# Patient Record
Sex: Male | Born: 1945
Health system: Southern US, Community
[De-identification: ages and names within clinical notes are randomized; demographics above are authoritative.]

## PROBLEM LIST (undated history)

## (undated) DIAGNOSIS — I10 Essential (primary) hypertension: Secondary | ICD-10-CM

## (undated) DIAGNOSIS — H409 Unspecified glaucoma: Secondary | ICD-10-CM

## (undated) DIAGNOSIS — J439 Emphysema, unspecified: Secondary | ICD-10-CM

## (undated) DIAGNOSIS — F329 Major depressive disorder, single episode, unspecified: Secondary | ICD-10-CM

## (undated) DIAGNOSIS — I2510 Atherosclerotic heart disease of native coronary artery without angina pectoris: Secondary | ICD-10-CM

## (undated) DIAGNOSIS — J45909 Unspecified asthma, uncomplicated: Secondary | ICD-10-CM

## (undated) DIAGNOSIS — M1990 Unspecified osteoarthritis, unspecified site: Secondary | ICD-10-CM

## (undated) DIAGNOSIS — F32A Depression, unspecified: Secondary | ICD-10-CM

## (undated) HISTORY — DX: Emphysema, unspecified: J43.9

## (undated) HISTORY — DX: Depression, unspecified: F32.A

## (undated) HISTORY — DX: Unspecified asthma, uncomplicated: J45.909

## (undated) HISTORY — DX: Essential (primary) hypertension: I10

## (undated) HISTORY — DX: Major depressive disorder, single episode, unspecified: F32.9

## (undated) HISTORY — DX: Unspecified glaucoma: H40.9

## (undated) HISTORY — DX: Atherosclerotic heart disease of native coronary artery without angina pectoris: I25.10

## (undated) HISTORY — DX: Unspecified osteoarthritis, unspecified site: M19.90

---

## 1966-12-16 HISTORY — PX: SKIN GRAFT: SHX250

## 1966-12-16 HISTORY — PX: HUMERUS FRACTURE SURGERY: SHX670

## 1966-12-16 HISTORY — PX: HIP SURGERY: SHX245

## 1983-12-17 HISTORY — PX: BACK SURGERY: SHX140

## 1999-07-17 HISTORY — PX: OTHER SURGICAL HISTORY: SHX169

## 2010-10-18 ENCOUNTER — Inpatient Hospital Stay: Payer: Self-pay | Admitting: Internal Medicine

## 2010-12-22 ENCOUNTER — Emergency Department: Payer: Self-pay | Admitting: Emergency Medicine

## 2011-01-14 ENCOUNTER — Ambulatory Visit: Payer: Self-pay | Admitting: Internal Medicine

## 2011-10-05 ENCOUNTER — Emergency Department: Payer: Self-pay | Admitting: Emergency Medicine

## 2013-04-15 DIAGNOSIS — I2510 Atherosclerotic heart disease of native coronary artery without angina pectoris: Secondary | ICD-10-CM | POA: Insufficient documentation

## 2013-04-15 DIAGNOSIS — M549 Dorsalgia, unspecified: Secondary | ICD-10-CM | POA: Insufficient documentation

## 2013-05-14 DIAGNOSIS — F17200 Nicotine dependence, unspecified, uncomplicated: Secondary | ICD-10-CM | POA: Insufficient documentation

## 2013-07-12 DIAGNOSIS — K219 Gastro-esophageal reflux disease without esophagitis: Secondary | ICD-10-CM | POA: Insufficient documentation

## 2014-08-20 ENCOUNTER — Emergency Department: Payer: Self-pay | Admitting: Emergency Medicine

## 2015-01-20 DIAGNOSIS — Z9181 History of falling: Secondary | ICD-10-CM | POA: Insufficient documentation

## 2015-02-22 DIAGNOSIS — I208 Other forms of angina pectoris: Secondary | ICD-10-CM | POA: Insufficient documentation

## 2015-02-22 DIAGNOSIS — I2089 Other forms of angina pectoris: Secondary | ICD-10-CM | POA: Insufficient documentation

## 2015-05-25 DIAGNOSIS — Z79891 Long term (current) use of opiate analgesic: Secondary | ICD-10-CM | POA: Insufficient documentation

## 2015-06-16 DIAGNOSIS — I10 Essential (primary) hypertension: Secondary | ICD-10-CM | POA: Insufficient documentation

## 2015-06-26 DIAGNOSIS — R110 Nausea: Secondary | ICD-10-CM | POA: Insufficient documentation

## 2015-07-05 DIAGNOSIS — F329 Major depressive disorder, single episode, unspecified: Secondary | ICD-10-CM | POA: Insufficient documentation

## 2015-07-05 DIAGNOSIS — F419 Anxiety disorder, unspecified: Secondary | ICD-10-CM | POA: Insufficient documentation

## 2015-07-05 DIAGNOSIS — F3341 Major depressive disorder, recurrent, in partial remission: Secondary | ICD-10-CM | POA: Insufficient documentation

## 2015-10-05 DIAGNOSIS — M76899 Other specified enthesopathies of unspecified lower limb, excluding foot: Secondary | ICD-10-CM | POA: Insufficient documentation

## 2016-08-28 DIAGNOSIS — I714 Abdominal aortic aneurysm, without rupture, unspecified: Secondary | ICD-10-CM | POA: Insufficient documentation

## 2016-08-28 DIAGNOSIS — I7123 Aneurysm of the descending thoracic aorta, without rupture: Secondary | ICD-10-CM | POA: Insufficient documentation

## 2016-08-28 DIAGNOSIS — I712 Thoracic aortic aneurysm, without rupture: Secondary | ICD-10-CM | POA: Insufficient documentation

## 2016-12-17 DIAGNOSIS — Z0289 Encounter for other administrative examinations: Secondary | ICD-10-CM | POA: Insufficient documentation

## 2017-05-23 ENCOUNTER — Encounter (INDEPENDENT_AMBULATORY_CARE_PROVIDER_SITE_OTHER): Payer: Self-pay | Admitting: Vascular Surgery

## 2017-06-03 ENCOUNTER — Encounter (INDEPENDENT_AMBULATORY_CARE_PROVIDER_SITE_OTHER): Payer: Self-pay | Admitting: Vascular Surgery

## 2017-06-16 ENCOUNTER — Ambulatory Visit: Payer: Self-pay | Admitting: Family Medicine

## 2017-06-23 ENCOUNTER — Ambulatory Visit: Payer: Self-pay | Admitting: Family Medicine

## 2017-07-29 ENCOUNTER — Telehealth (INDEPENDENT_AMBULATORY_CARE_PROVIDER_SITE_OTHER): Payer: Self-pay | Admitting: Vascular Surgery

## 2017-07-29 ENCOUNTER — Encounter (INDEPENDENT_AMBULATORY_CARE_PROVIDER_SITE_OTHER): Payer: Self-pay | Admitting: Vascular Surgery

## 2017-07-29 NOTE — Telephone Encounter (Signed)
PT called because he wanted to reschedule because he tired and has not had any rest. I advised him that calling the same day may result in a fee for no show. He stated that he didn't know he would be tired and would not be paying. I advised him that it is policy to give a 24hr notice to reschedule. He stated he was not going to reschedule at this time just was not going to come. We disconnected and he called right back to reschedule appt. Aware of the fee.

## 2017-09-05 ENCOUNTER — Ambulatory Visit (INDEPENDENT_AMBULATORY_CARE_PROVIDER_SITE_OTHER): Payer: Medicare HMO | Admitting: Vascular Surgery

## 2017-09-05 ENCOUNTER — Encounter (INDEPENDENT_AMBULATORY_CARE_PROVIDER_SITE_OTHER): Payer: Self-pay | Admitting: Vascular Surgery

## 2017-09-05 VITALS — BP 153/95 | HR 96 | Resp 16 | Ht 69.0 in | Wt 152.0 lb

## 2017-09-05 DIAGNOSIS — I714 Abdominal aortic aneurysm, without rupture, unspecified: Secondary | ICD-10-CM

## 2017-09-05 DIAGNOSIS — I10 Essential (primary) hypertension: Secondary | ICD-10-CM | POA: Diagnosis not present

## 2017-09-05 DIAGNOSIS — F17200 Nicotine dependence, unspecified, uncomplicated: Secondary | ICD-10-CM | POA: Insufficient documentation

## 2017-09-05 DIAGNOSIS — F17210 Nicotine dependence, cigarettes, uncomplicated: Secondary | ICD-10-CM | POA: Diagnosis not present

## 2017-09-05 NOTE — Assessment & Plan Note (Signed)
blood pressure control important in reducing the progression of atherosclerotic disease and aneurysmal degeneration. On appropriate oral medications.  

## 2017-09-05 NOTE — Progress Notes (Signed)
Patient ID: Logan Vazquez, male   DOB: [birth year], 71 y.o.   MRN: [id number]  Chief Complaint  Patient presents with  . AAA    ref [facility]    HPI Logan Vazquez is a 71 y.o. male.  I am asked to see the patient by Dr. Juliann Pares for evaluation of AAA.  The patient reports Tonic low back pain and arthritis with difficulty walking but no new abdominal pain or signs of peripheral embolization. He denies fever or chills. He has had this aneurysm checked and it was reported to be 3.6 cm a little over a year ago. Smoked. He also has hypertension.   Past Medical History:  Diagnosis Date  . Arthritis   . Asthma   . Emphysema lung (HCC)   . Glaucoma   . Heart disease   . Hypertension     Past Surgical History:  Procedure Laterality Date  . BACK SURGERY    . heart surgery     2 stent placement  . HIP SURGERY Left     Family History  Problem Relation Age of Onset  . Hypertension Mother   . Cancer Father   . Hypertension Father   . Stroke Sister   . Heart disease Brother   . Heart attack Brother      Social History Social History  Substance Use Topics  . Smoking status: Current Every Day Smoker  . Smokeless tobacco: Never Used  . Alcohol use No    Allergies  Allergen Reactions  . Ibuprofen Nausea And Vomiting    Current Outpatient Prescriptions  Medication Sig Dispense Refill  . albuterol (PROVENTIL HFA;VENTOLIN HFA) 108 (90 Base) MCG/ACT inhaler Inhale into the lungs.    [name] aspirin EC 81 MG tablet Take 81 mg by mouth.    . celecoxib (CELEBREX) 100 MG capsule     . Cholecalciferol (VITAMIN D-1000 MAX ST) 1000 units tablet Take by mouth.    . Cyanocobalamin (B-12) 2000 MCG TABS Take by mouth.    . furosemide (LASIX) 20 MG tablet Take by mouth.    . gabapentin (NEURONTIN) 600 MG tablet TAKE 1 TABLET THREE TIMES DAILY    . mirtazapine (REMERON) 45 MG tablet     . nitroGLYCERIN (NITROSTAT) 0.4 MG SL tablet Place under the tongue.    [name] omeprazole (PRILOSEC) 20 MG  capsule     . PARoxetine (PAXIL) 40 MG tablet Take by mouth.     No current facility-administered medications for this visit.       REVIEW OF SYSTEMS (Negative unless checked)  Constitutional: Weight loss  Fever  Chills Cardiac: Chest pain   Chest pressure   Palpitations   Shortness of breath when laying flat   Shortness of breath at rest   Shortness of breath with exertion. Vascular:  Pain in legs with walking   Pain in legs at rest   Pain in legs when laying flat   Claudication   Pain in feet when walking  Pain in feet at rest  Pain in feet when laying flat   History of DVT   Phlebitis   Swelling in legs   Varicose veins   Non-healing ulcers Pulmonary:   Uses home oxygen   Productive cough   Hemoptysis   Wheeze  COPD   Asthma Neurologic:  Dizziness  Blackouts   Seizures   History of stroke   History of TIA  Aphasia   Temporary blindness   Dysphagia   Weakness or  numbness in arms   [] Weakness or numbness in legs Musculoskeletal:  [x] Arthritis   [] Joint swelling   [] Joint pain   [x] Low back pain Hematologic:  [] Easy bruising  [] Easy bleeding   [] Hypercoagulable state   [] Anemic  [] Hepatitis Gastrointestinal:  [] Blood in stool   [] Vomiting blood  [x] Gastroesophageal reflux/heartburn   [] Abdominal pain Genitourinary:  [] Chronic kidney disease   [] Difficult urination  [] Frequent urination  [] Burning with urination   [] Hematuria Skin:  [] Rashes   [] Ulcers   [] Wounds Psychological:  [x] History of anxiety   [x]  History of major depression.    Physical Exam BP (!) 153/95 (BP Location: Right Arm)   Pulse 96   Resp 16   Ht 5\' 9"  (1.753 m)   Wt 152 lb (68.9 kg)   BMI 22.45 kg/m  Gen:  WD/WN, NAD Head: [place]/AT, No temporalis wasting. Prominent temp pulse not noted. Ear/Nose/Throat: Hearing grossly intact, nares w/o erythema or drainage, oropharynx w/o Erythema/Exudate Eyes: Conjunctiva clear, sclera non-icteric  Neck:  trachea midline.  No bruit or JVD.  Pulmonary:  Good air movement, clear to auscultation bilaterally.  Cardiac: RRR, normal S1, S2 Vascular:  Vessel Right Left  Radial Palpable Palpable                                   Gastrointestinal: soft, non-tender/non-distended. No guarding/reflex. No masses, surgical incisions, or scars. Moderate increased aortic impulse Musculoskeletal: M/S 5/5 throughout.  Extremities without ischemic changes. His ambulation is quite labored and he uses a cane for support Neurologic: Sensation grossly intact in extremities.  Symmetrical.  Speech is fluent. Motor exam as listed above. Psychiatric: Judgment intact, Mood & affect appropriate for pt's clinical situation. Dermatologic: No rashes or ulcers noted.  No cellulitis or open wounds.    Radiology No results found.  Labs No results found for this or any previous visit (from the past 2160 hour(s)).  Assessment/Plan:  Hypertension blood pressure control important in reducing the progression of atherosclerotic disease and aneurysmal degeneration. On appropriate oral medications.   Tobacco dependence We had a discussion for approximately 3-4 minutes regarding the absolute need for smoking cessation due to the deleterious nature of tobacco on the vascular system, particularly with aneurysms. We discussed the tobacco use would diminish patency of any intervention, and likely significantly worsen progressio of disease. We discussed multiple agents for quitting including replacement therapy or medications to reduce cravings such as Chantix. The patient voices their understanding of the importance of smoking cessation.   AAA (abdominal aortic aneurysm) without rupture (HCC) The patient has a known abdominal aortic aneurysm which has not been checked in over a year. It measured 3.6 cm in May 2017 by report. I do not have a CT scan or an ultrasound to independently reviewed. This should be rechecked. We will  obtain a AAA duplex at his convenience in the near future. We have had a long discussion about the importance of smoking cessation and blood pressure control. I will see him back following his studies.      Festus Barren 09/05/2017, 4:33 PM   This note was created with Dragon medical transcription system.  Any errors from dictation are unintentional.

## 2017-09-05 NOTE — Patient Instructions (Signed)
Abdominal Aortic Aneurysm Blood pumps away from the heart through tubes (blood vessels) called arteries. Aneurysms are weak or damaged places in the wall of an artery. It bulges out like a balloon. An abdominal aortic aneurysm happens in the main artery of the body (aorta). It can burst or tear, causing bleeding inside the body. This is an emergency. It needs treatment right away. What are the causes? The exact cause is unknown. Things that could cause this problem include:  Fat and other substances building up in the lining of a tube.  Swelling of the walls of a blood vessel.  Certain tissue diseases.  Belly (abdominal) trauma.  An infection in the main artery of the body.  What increases the risk? There are things that make it more likely for you to have an aneurysm. These include:  Being over the age of 72 years old.  Having high blood pressure (hypertension).  Being a male.  Being white.  Being very overweight (obese).  Having a family history of aneurysm.  Using tobacco products.  What are the signs or symptoms? Symptoms depend on the size of the aneurysm and how fast it grows. There may not be symptoms. If symptoms occur, they can include:  Pain (belly, side, lower back, or groin).  Feeling full after eating a small amount of food.  Feeling sick to your stomach (nauseous), throwing up (vomiting), or both.  Feeling a lump in your belly that feels like it is beating (pulsating).  Feeling like you will pass out (faint).  How is this treated?  Medicine to control blood pressure and pain.  Imaging tests to see if the aneurysm gets bigger.  Surgery. How is this prevented? To lessen your chance of getting this condition:  Stop smoking. Stop chewing tobacco.  Limit or avoid alcohol.  Keep your blood pressure, blood sugar, and cholesterol within normal limits.  Eat less salt.  Eat foods low in saturated fats and cholesterol. These are found in animal and  whole dairy products.  Eat more fiber. Fiber is found in whole grains, vegetables, and fruits.  Keep a healthy weight.  Stay active and exercise often.  This information is not intended to replace advice given to you by your health care provider. Make sure you discuss any questions you have with your health care provider. Document Released: 03/29/2013 Document Revised: 05/09/2016 Document Reviewed: 01/01/2013 Elsevier Interactive Patient Education  2017 [employer]  

## 2017-09-05 NOTE — Assessment & Plan Note (Signed)
The patient has a known abdominal aortic aneurysm which has not been checked in over a year. It measured 3.6 cm in May 2017 by report. I do not have a CT scan or an ultrasound to independently reviewed. This should be rechecked. We will obtain a AAA duplex at his convenience in the near future. We have had a long discussion about the importance of smoking cessation and blood pressure control. I will see him back following his studies.

## 2017-09-05 NOTE — Assessment & Plan Note (Signed)
We had a discussion for approximately 3-4 minutes regarding the absolute need for smoking cessation due to the deleterious nature of tobacco on the vascular system, particularly with aneurysms. We discussed the tobacco use would diminish patency of any intervention, and likely significantly worsen progressio of disease. We discussed multiple agents for quitting including replacement therapy or medications to reduce cravings such as Chantix. The patient voices their understanding of the importance of smoking cessation.

## 2017-10-08 ENCOUNTER — Ambulatory Visit (INDEPENDENT_AMBULATORY_CARE_PROVIDER_SITE_OTHER): Payer: Medicare HMO | Admitting: Vascular Surgery

## 2017-10-08 ENCOUNTER — Encounter (INDEPENDENT_AMBULATORY_CARE_PROVIDER_SITE_OTHER): Payer: Self-pay | Admitting: Vascular Surgery

## 2017-10-08 ENCOUNTER — Ambulatory Visit (INDEPENDENT_AMBULATORY_CARE_PROVIDER_SITE_OTHER): Payer: Medicare HMO

## 2017-10-08 VITALS — BP 155/60 | HR 84 | Resp 16 | Wt 151.0 lb

## 2017-10-08 DIAGNOSIS — F17200 Nicotine dependence, unspecified, uncomplicated: Secondary | ICD-10-CM | POA: Diagnosis not present

## 2017-10-08 DIAGNOSIS — I714 Abdominal aortic aneurysm, without rupture, unspecified: Secondary | ICD-10-CM

## 2017-10-08 DIAGNOSIS — I10 Essential (primary) hypertension: Secondary | ICD-10-CM

## 2017-10-08 NOTE — Progress Notes (Signed)
Subjective:    Patient ID: Logan Vazquez, male    DOB: 1946-09-13, 71 y.o.   MRN: [id number] Chief Complaint  Patient presents with  . AAA    pt conv   Patient presents for a yearly abdominal aortic aneurysm follow-up. He presents without complaint. Denies any abdominal, back pain or thrombosis of the lower extremities. The patient underwent a abdominal aortic duplex exam which was notable for aneurysmal dilatation of the proximal and distal abdominal aorta with a maximum diameter of 4.4cm in the distal segment. Ectatic left common iliac artery noted. When compared to the previous exam on 05/09/2016 completed in [place] there has been an increase in the abdominal aortic aneurysm. Patient denies any fever, nausea or vomiting.   Review of Systems  Constitutional: Negative.   HENT: Negative.   Eyes: Negative.   Respiratory: Negative.   Cardiovascular: Negative.   Gastrointestinal: Negative.   Endocrine: Negative.   Genitourinary: Negative.   Musculoskeletal: Negative.   Skin: Negative.   Allergic/Immunologic: Negative.   Neurological: Negative.   Hematological: Negative.   Psychiatric/Behavioral: Negative.       Objective:   Physical Exam  Constitutional: He is oriented to person, place, and time. He appears well-developed and well-nourished. No distress.  HENT:  Head: Normocephalic and atraumatic.  Eyes: Pupils are equal, round, and reactive to light. Conjunctivae are normal.  Neck: Normal range of motion.  Cardiovascular: Normal rate, regular rhythm, normal heart sounds and intact distal pulses.   Pulmonary/Chest: Effort normal and breath sounds normal.  Abdominal: Soft. Bowel sounds are normal.  Musculoskeletal: Normal range of motion.  Neurological: He is alert and oriented to person, place, and time.  Skin: Skin is warm and dry. He is not diaphoretic.  Psychiatric: He has a normal mood and affect. His behavior is normal. Judgment and thought content normal.  Vitals  reviewed.  BP (!) 155/60 (BP Location: Left Arm)   Pulse 84   Resp 16   Wt 151 lb (68.5 kg)   BMI 22.30 kg/m   Past Medical History:  Diagnosis Date  . Arthritis   . Asthma   . Emphysema lung (HCC)   . Glaucoma   . Heart disease   . Hypertension    Social History   Social History  . Marital status: Single    Spouse name: N/A  . Number of children: N/A  . Years of education: N/A   Occupational History  . Not on file.   Social History Main Topics  . Smoking status: Current Every Day Smoker  . Smokeless tobacco: Never Used  . Alcohol use No  . Drug use: No  . Sexual activity: Not on file   Other Topics Concern  . Not on file   Social History Narrative  . No narrative on file   Past Surgical History:  Procedure Laterality Date  . BACK SURGERY    . heart surgery     2 stent placement  . HIP SURGERY Left    Family History  Problem Relation Age of Onset  . Hypertension Mother   . Cancer Father   . Hypertension Father   . Stroke Sister   . Heart disease Brother   . Heart attack Brother    Allergies  Allergen Reactions  . Ibuprofen Nausea And Vomiting      Assessment & Plan:  Patient presents for a yearly abdominal aortic aneurysm follow-up. He presents without complaint. Denies any abdominal, back pain or thrombosis of the  lower extremities. The patient underwent a abdominal aortic duplex exam which was notable for aneurysmal dilatation of the proximal and distal abdominal aorta with a maximum diameter of 4.4cm in the distal segment. Ectatic left common iliac artery noted. When compared to the previous exam on 05/09/2016 completed in [place] there has been an increase in the abdominal aortic aneurysm. Patient denies any fever, nausea or vomiting.  1. AAA (abdominal aortic aneurysm) without rupture (HCC) - Stable Studies reviewed with patient. Duplex stable, physical exam unremarkable.  No surgery or intervention at this time. The patient to follow up  in six months with an aortic duplex. The patient has an asymptomatic abdominal aortic aneurysm that is greater than 4cm in maximal diameter.  I have reviewed the natural history of abdominal aortic aneurysm and the small risk of rupture for aneurysm less than 5cm in size.  However, as these small aneurysms tend to enlarge over time, continued surveillance with ultrasound or CT scan is mandatory.  The patient's blood pressure is being adequately controlled however I have reviewed the importance of hypertension and lipid control and the importance of continuing his abstinence from tobacco.  The patient is also encouraged to exercise a minimum of 30 minutes 4 times a week.  Should the patient develop new onset abdominal or back pain or signs of peripheral embolization they are instructed to seek medical attention immediately and to alert the physician providing care that they have an aneurysm.  The patient voices their understanding.  - VAS US AAA DUPLEX; Future  2. Tobacco dependence - Stable We had a discussion for approximately 10 minutes regarding the absolute need for smoking cessation due to the deleterious nature of tobacco on the vascular system. We discussed the tobacco use would diminish patency of any intervention, and likely significantly worsen progressio of disease. We discussed multiple agents for quitting including replacement therapy or medications to reduce cravings such as Chantix. The patient voices their understanding of the importance of smoking cessation.  3. Essential hypertension - Stable Encouraged good control as its slows the progression of atherosclerotic disease  Current Outpatient Prescriptions on File Prior to Visit  Medication Sig Dispense Refill  . albuterol (PROVENTIL HFA;VENTOLIN HFA) 108 (90 Base) MCG/ACT inhaler Inhale into the lungs.    [name]. aspirin EC 81 MG tablet Take 81 mg by mouth.    . celecoxib (CELEBREX) 100 MG capsule     . Cholecalciferol (VITAMIN D-1000  MAX ST) 1000 units tablet Take by mouth.    . Cyanocobalamin (B-12) 2000 MCG TABS Take by mouth.    . furosemide (LASIX) 20 MG tablet Take by mouth.    . gabapentin (NEURONTIN) 600 MG tablet TAKE 1 TABLET THREE TIMES DAILY    . mirtazapine (REMERON) 45 MG tablet     . nitroGLYCERIN (NITROSTAT) 0.4 MG SL tablet Place under the tongue.    [name]. omeprazole (PRILOSEC) 20 MG capsule     . PARoxetine (PAXIL) 40 MG tablet Take by mouth.     No current facility-administered medications on file prior to visit.     There are no Patient Instructions on file for this visit. No Follow-up on file.   Ame Heagle A Nevaya Nagele, PA-C

## 2017-10-23 ENCOUNTER — Ambulatory Visit (INDEPENDENT_AMBULATORY_CARE_PROVIDER_SITE_OTHER): Payer: Medicare HMO | Admitting: Family Medicine

## 2017-10-23 ENCOUNTER — Encounter: Payer: Self-pay | Admitting: Family Medicine

## 2017-10-23 VITALS — BP 158/84 | HR 96 | Temp 98.0°F | Resp 16 | Ht 69.0 in | Wt 149.0 lb

## 2017-10-23 DIAGNOSIS — Z7689 Persons encountering health services in other specified circumstances: Secondary | ICD-10-CM

## 2017-10-23 DIAGNOSIS — I10 Essential (primary) hypertension: Secondary | ICD-10-CM | POA: Diagnosis not present

## 2017-10-23 DIAGNOSIS — F17200 Nicotine dependence, unspecified, uncomplicated: Secondary | ICD-10-CM | POA: Diagnosis not present

## 2017-10-23 DIAGNOSIS — F339 Major depressive disorder, recurrent, unspecified: Secondary | ICD-10-CM

## 2017-10-23 DIAGNOSIS — F419 Anxiety disorder, unspecified: Secondary | ICD-10-CM | POA: Diagnosis not present

## 2017-10-23 DIAGNOSIS — G8929 Other chronic pain: Secondary | ICD-10-CM | POA: Diagnosis not present

## 2017-10-23 DIAGNOSIS — Z23 Encounter for immunization: Secondary | ICD-10-CM | POA: Diagnosis not present

## 2017-10-23 DIAGNOSIS — I714 Abdominal aortic aneurysm, without rupture, unspecified: Secondary | ICD-10-CM

## 2017-10-23 DIAGNOSIS — J449 Chronic obstructive pulmonary disease, unspecified: Secondary | ICD-10-CM | POA: Insufficient documentation

## 2017-10-23 DIAGNOSIS — M545 Low back pain, unspecified: Secondary | ICD-10-CM

## 2017-10-23 DIAGNOSIS — I25119 Atherosclerotic heart disease of native coronary artery with unspecified angina pectoris: Secondary | ICD-10-CM | POA: Diagnosis not present

## 2017-10-23 LAB — CBC
HCT: 43.1 % (ref 38.5–50.0)
Hemoglobin: 14.7 g/dL (ref 13.2–17.1)
MCH: 30.9 pg (ref 27.0–33.0)
MCHC: 34.1 g/dL (ref 32.0–36.0)
MCV: 90.5 fL (ref 80.0–100.0)
MPV: 10 fL (ref 7.5–12.5)
Platelets: 349 10*3/uL (ref 140–400)
RBC: 4.76 10*6/uL (ref 4.20–5.80)
RDW: 13.1 % (ref 11.0–15.0)
WBC: 8.3 10*3/uL (ref 3.8–10.8)

## 2017-10-23 LAB — COMPREHENSIVE METABOLIC PANEL
AG Ratio: 1.6 (calc) (ref 1.0–2.5)
ALT: 10 U/L (ref 9–46)
AST: 14 U/L (ref 10–35)
Albumin: 4.3 g/dL (ref 3.6–5.1)
Alkaline phosphatase (APISO): 97 U/L (ref 40–115)
BUN: 15 mg/dL (ref 7–25)
CO2: 29 mmol/L (ref 20–32)
Calcium: 9.5 mg/dL (ref 8.6–10.3)
Chloride: 102 mmol/L (ref 98–110)
Creat: 0.92 mg/dL (ref 0.70–1.18)
Globulin: 2.7 g/dL (calc) (ref 1.9–3.7)
Glucose, Bld: 99 mg/dL (ref 65–139)
Potassium: 4.9 mmol/L (ref 3.5–5.3)
Sodium: 138 mmol/L (ref 135–146)
Total Bilirubin: 0.5 mg/dL (ref 0.2–1.2)
Total Protein: 7 g/dL (ref 6.1–8.1)

## 2017-10-23 LAB — LIPID PANEL
Cholesterol: 179 mg/dL (ref <200)
HDL: 43 mg/dL (ref >40)
LDL Cholesterol (Calc): 119 mg/dL (calc) — ABNORMAL HIGH
Non-HDL Cholesterol (Calc): 136 mg/dL (calc) — ABNORMAL HIGH (ref <130)
Total CHOL/HDL Ratio: 4.2 (calc) (ref <5.0)
Triglycerides: 75 mg/dL (ref <150)

## 2017-10-23 LAB — TSH: TSH: 1.55 mIU/L (ref 0.40–4.50)

## 2017-10-23 MED ORDER — DULOXETINE HCL 60 MG PO CPEP: 60.0000 mg | ORAL_CAPSULE | Freq: Every day | ORAL | 3 refills | Status: DC

## 2017-10-23 MED ORDER — LISINOPRIL 20 MG PO TABS: 20.0000 mg | ORAL_TABLET | Freq: Every day | ORAL | 3 refills | Status: DC

## 2017-10-23 NOTE — Patient Instructions (Signed)

## 2017-10-23 NOTE — Assessment & Plan Note (Signed)
Not well-controlled Given his chronic pain and depression, anxiety, we will stop Paxil and try Cymbalta Start Cymbalta 60 mg daily Advised patient that I have no intention of resuming benzos Follow-up in one month and consider dose adjustment Discussed potential side effects

## 2017-10-23 NOTE — Assessment & Plan Note (Signed)
Uncontrolled Needs tight control given history of CAD, AAA Restart ACE inhibitor We'll start lisinopril 20 mg daily Follow-up in one month Check labs

## 2017-10-23 NOTE — Assessment & Plan Note (Signed)
Status post 2 stent placement Followed by cardiology Likely needs to be on a statin for secondary prevention Check lipid panel
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
## 2017-10-23 NOTE — Assessment & Plan Note (Signed)
See plan above for depression Stop Paxil, start Cymbalta No intention to resume benzos Discussed harms of chronic benzo therapy, especially concomitantly with opioids

## 2017-10-23 NOTE — Assessment & Plan Note (Signed)
followed by vascular surgery As above, needs tight blood pressure control and likely needs statin

## 2017-10-23 NOTE — Assessment & Plan Note (Signed)
Chronic pain syndrome of multiple locations, especially lower back Continue gabapentin, but could consider increasing dose depending on GR Switch to Cymbalta in lieu of SSRI as well Can continue Celebrex, but discuss risk of kidney disease failure Discuss with the patient that he was previously on very high dose opioids and I have no intention of resuming opioid therapy for him In the future, he may benefit from pain management referral

## 2017-10-23 NOTE — Assessment & Plan Note (Signed)
We had a discussion of approximately 3-4 minutes regarding the need for tobacco cessation due to his heart disease, AAA, hypertension, lung disease He states it is too difficult at this time, he has slowly been cutting back He is not interested in medical therapy to help with cessation of this time

## 2017-10-23 NOTE — Assessment & Plan Note (Signed)
Patient is unsure if he has any lung disease It is noted in his chart that he has a history of emphysema He is not taking any controller medications He does use albuterol as needed, which he states is about daily He is wheezing on exam today Consider PFTs and controller medications

## 2017-10-28 ENCOUNTER — Telehealth: Payer: Self-pay

## 2017-10-28 NOTE — Telephone Encounter (Signed)
Lmtcb. Rachelle also tried calling pt. This was the second attempt.

## 2017-10-28 NOTE — Telephone Encounter (Signed)
-----   Message from Erasmo Downer, MD sent at 10/24/2017  8:38 AM EST ----- Normal Blood counts, kidney function, liver function, electrolytes, Thyroid function.  Cholesterol is high.  LDL (bad cholesterol) is 199, goal is <70 given known heart disease.  Advise patient to take Lipitor.  IF he agrees, ok to send Rx for Lipitor 40mg  daily #30 r3.  Will recheck LDL and LFTs in 3 months.  Erasmo Downer, MD, MPH [facility] 10/24/2017 8:38 AM

## 2017-10-30 NOTE — Telephone Encounter (Signed)
Tried calling pt. No answer. Mailed letter.

## 2017-11-03 ENCOUNTER — Other Ambulatory Visit: Payer: Self-pay

## 2017-11-03 MED ORDER — ATORVASTATIN CALCIUM 40 MG PO TABS: 40.0000 mg | ORAL_TABLET | Freq: Every day | ORAL | 3 refills | Status: DC

## 2017-11-03 MED ORDER — GABAPENTIN 600 MG PO TABS
600.0000 mg | ORAL_TABLET | Freq: Three times a day (TID) | ORAL | 3 refills | Status: DC
Start: 2017-11-03 — End: 2017-12-26

## 2017-11-03 NOTE — Telephone Encounter (Signed)
Patient calling back regarding the letter he got.Letter was mailed regarding his labs results. He agree to Lipitor 40mg  daily Qty:30 R:3 and advised that we will recheck LDL and LFTs in 3 months. Patient is also requesting a refill for Gabapentin 600mg . Is it ok to refill.

## 2017-11-16 DIAGNOSIS — I639 Cerebral infarction, unspecified: Secondary | ICD-10-CM | POA: Insufficient documentation

## 2017-11-24 ENCOUNTER — Ambulatory Visit: Payer: Self-pay | Admitting: Family Medicine

## 2017-12-02 ENCOUNTER — Telehealth: Payer: Self-pay | Admitting: Family Medicine

## 2017-12-02 NOTE — Telephone Encounter (Signed)
Logan Vazquez with [facility] is requesting verbal orders for in home physical therapy as follows:  1. Twice a week for 5 weeks  2. Medical social worker visit to assist with community resources  3. Logan Vazquez also request a written Rx for a wheeled walker faxed to [phone]. Please advise. Thanks

## 2017-12-02 NOTE — Telephone Encounter (Signed)
Ok for verbal order  °

## 2017-12-03 NOTE — Telephone Encounter (Signed)
Logan Vazquez returned call. While I had him on the phone I asked about walker. He stated they are requesting a 4 wheeled walker that also has a seat on the walker. Logan Vazquez needs a call back for the approved verbal orders. Thanks TNP

## 2017-12-03 NOTE — Telephone Encounter (Signed)
Called with verbal order ok. Need to know if the wheeled walker needs to be 2 wheel or 4 wheel walker.

## 2017-12-03 NOTE — Telephone Encounter (Signed)
Done. Order faxed.

## 2017-12-03 NOTE — Telephone Encounter (Signed)
OK to verbal order  Erasmo Downer, MD, MPH [facility] 12/03/2017 9:52 AM

## 2017-12-05 ENCOUNTER — Telehealth: Payer: Self-pay | Admitting: Family Medicine

## 2017-12-05 NOTE — Telephone Encounter (Signed)
Robin with [facility] called for a Verbal order for OT for Mr. Goette. 1 week 1 time 2 week three times   Exercises and safety.  Her number is [phone]  Thanks teri

## 2017-12-05 NOTE — Telephone Encounter (Signed)
That's fine

## 2017-12-05 NOTE — Telephone Encounter (Signed)
Appointment made for 12/17/17. Anything else needs to be done/ please review for Dr Carl Best, RMA

## 2017-12-05 NOTE — Telephone Encounter (Signed)
Robin advised-Anastasiya V Hopkins, RMA  

## 2017-12-05 NOTE — Telephone Encounter (Signed)
Samantha with [facility] called to report pt fell 3 times yesterday at home.  Pt was able to get himself up each time.  Pt is having back pain.  Samantha request I schedule an appointment w/Dr B.  [phone]

## 2017-12-17 ENCOUNTER — Ambulatory Visit: Payer: Medicare HMO | Admitting: Family Medicine

## 2017-12-22 ENCOUNTER — Telehealth: Payer: Self-pay | Admitting: Family Medicine

## 2017-12-22 NOTE — Telephone Encounter (Signed)
Logan Vazquez with [facility] called to report elevated vitals.  He states on 12/19/2017 at about 1:30pm his BP was 178/104 and today at about 1:30 his BP was 168/99.  He states that he was resting at the time and had been sitting in a chair.  He states that he has been taking his medication as directed and eating normal so he is not doing anything out of the ordinary to make it elevated.  He was wanting to know if you wanted to change anything.  CB# [phone]

## 2017-12-22 NOTE — Telephone Encounter (Signed)
Please review

## 2017-12-23 NOTE — Telephone Encounter (Signed)
Tried calling pt; phone kept ringing without VM. Will try again later.

## 2017-12-23 NOTE — Telephone Encounter (Signed)
Any chest pain, shortness of breath, headaches, vision changes?  If yes, may need to seek medical care.  If no, increase lisinopril to 40mg  daily (can take 2 of the 20mg  pills together)  Beryle Flock, Marzella Schlein, MD, MPH [facility] 12/23/2017 8:06 AM

## 2017-12-25 NOTE — Telephone Encounter (Signed)
Tried calling pt again, no answer and no VM set up.

## 2017-12-26 ENCOUNTER — Ambulatory Visit (INDEPENDENT_AMBULATORY_CARE_PROVIDER_SITE_OTHER): Payer: Medicare HMO | Admitting: Family Medicine

## 2017-12-26 ENCOUNTER — Encounter: Payer: Self-pay | Admitting: Family Medicine

## 2017-12-26 VITALS — BP 168/88 | HR 80 | Temp 97.6°F | Resp 16

## 2017-12-26 DIAGNOSIS — G8929 Other chronic pain: Secondary | ICD-10-CM | POA: Diagnosis not present

## 2017-12-26 DIAGNOSIS — M21371 Foot drop, right foot: Secondary | ICD-10-CM | POA: Diagnosis not present

## 2017-12-26 DIAGNOSIS — I10 Essential (primary) hypertension: Secondary | ICD-10-CM | POA: Diagnosis not present

## 2017-12-26 DIAGNOSIS — Z8673 Personal history of transient ischemic attack (TIA), and cerebral infarction without residual deficits: Secondary | ICD-10-CM

## 2017-12-26 DIAGNOSIS — F17200 Nicotine dependence, unspecified, uncomplicated: Secondary | ICD-10-CM

## 2017-12-26 DIAGNOSIS — M545 Low back pain: Secondary | ICD-10-CM | POA: Diagnosis not present

## 2017-12-26 MED ORDER — LISINOPRIL 40 MG PO TABS: 40.0000 mg | ORAL_TABLET | Freq: Every day | ORAL | 5 refills | Status: DC

## 2017-12-26 MED ORDER — PREGABALIN 75 MG PO CAPS: 75.0000 mg | ORAL_CAPSULE | Freq: Two times a day (BID) | ORAL | 2 refills | Status: DC

## 2017-12-26 NOTE — Telephone Encounter (Signed)
Pt coming on for OV today, will address then.

## 2017-12-26 NOTE — Assessment & Plan Note (Signed)
Discussed again importance of quitting Patient states he has quit but continues to smoke intermittently Discussed that smoking any cigarettes continues to place him at risk

## 2017-12-26 NOTE — Assessment & Plan Note (Signed)
Uncontrolled Needs tight control given h/o CAD and AAA and recent CVA Increase lisinopril to 40mg  daily F/u in 6 weeks

## 2017-12-26 NOTE — Assessment & Plan Note (Signed)
Patient was not seen for hospital f/u for this problem as no records were sent and patient did not alert office that this happened ~2 months ago He is working with PT - encouraged him to continue He has remaining deficits Needs good risk factor management Will make sure he has neurology f/u at next visit

## 2017-12-26 NOTE — Assessment & Plan Note (Signed)
2/2 CVA, not related to back pain Referral to PM&R to see if there are any assistive devices that he may benefit from

## 2017-12-26 NOTE — Assessment & Plan Note (Signed)
Chronic pain syndrome of entire low back Does have new neuro findings but these are consistent with his deficits from CVA Continue cymbalta D/c celebrex and gabapentin Trial of lyrica instead Patient asks again for opiates, which I reiterated should not be used for his chronic pain, especially with the very high doses he was on in the past States that everything makes him worse and unwilling to try more PT or ESI Referral to PM&R

## 2017-12-26 NOTE — Patient Instructions (Signed)

## 2017-12-26 NOTE — Progress Notes (Signed)
Patient: Logan Vazquez Male    DOB: December 29, 1945   72 y.o.   MRN: [id number] Visit Date: 12/26/2017  Today's Provider: Shirlee Latch, MD   I, Joslyn Hy, CMA, am acting as scribe for Shirlee Latch, MD.  Chief Complaint  Patient presents with  . Back Pain  . Hypertension   Subjective:    Back Pain  This is a chronic problem. The problem has been gradually worsening (since falling 3 years ago) since onset. Pain location: lower back bilateral sides. The quality of the pain is described as aching. The pain radiates to the left thigh and right thigh. The pain is at a severity of 9/10. The pain is severe. The pain is the same all the time. Exacerbated by: nothing. Associated symptoms include headaches, leg pain and weakness (s/p CVA). Pertinent negatives include no abdominal pain, bladder incontinence, bowel incontinence, chest pain, dysuria, fever, numbness or tingling. He has tried heat, ice and NSAIDs for the symptoms. The treatment provided no relief.    Has previously tried ESIs and states they didn't help States PT has made the pain worse in the past Had spinal fusion in 38s in [place] States that he has taken some leftover Vicodin at home and that helped a whole lot. He would like to take these chronically and states he knows lots of doctors that would give them out.  States he used to take chronic opioids He was previously taking Klonopin for anxiety, OxyContin (80mg  q6h), and oxycodone (15mg  q6h prn, Rx for #360 per month) for back pain. Dr. Hulan Fess discontinued these medications. He has not been on these medications since February 2018.  Celebrex and gabapentin didn't help at all so he stopped them.   Hypertension, follow-up: Pt's home health nurse called to inform PCP that his BP was elevated. They read 178/104, and 168/99.   BP Readings from Last 3 Encounters:  12/26/17 (!) 168/88  10/23/17 (!) 158/84  10/08/17 (!) 155/60    He is exercising. Physical  Therapy He is adherent to low salt diet.   He is experiencing dyspnea and fatigue.  Patient denies chest pain, chest pressure/discomfort, claudication, exertional chest pressure/discomfort, irregular heart beat, lower extremity edema, near-syncope, orthopnea, palpitations and syncope.   Cardiovascular risk factors include advanced age (older than 62 for men, 14 for women), dyslipidemia, hypertension, male gender and smoking/ tobacco exposure.   Current diet: in general, a "healthy" diet    ------------------------------------------------------------------------ States that since his CVA (10/2017, was admitted to [facility] for acute ischemic stroke) he has had R foot drop, weakness and nmbness of R leg.  He would like to get a brace for this.  He is currently getting PT.   Non-contrasted CT scan of the head, 11/10/2017: Moderate volume chronic small vessel ischemic changes. No acute intracranial abnormality.  Transthoracic echocardiogram, 11/11/2017:  Normal left ventricular systolic function, ejection fraction > 55%  Mitral annular calcification  Degenerative mitral valve disease  Aortic sclerosis with unusually hyperechoic leaflet tip   Aortic regurgitation - mild  Dilated ascending aorta  Normal right ventricular systolic function  Transesophageal echocardiogram, 11/14/2017: (preliminary)  Normal left ventricular systolic function, ejection fraction > 55%  Aortic regurgitation - moderate  No evidence for left atrial chamber or appendage thrombus  There is a mobile echodensity measured 0.7 cm that is associated with the aorta side of the non-coronary cusp, likely representing a lambl's excresence.  No evidence of intracardiac thrombus  No evidence of patent foramen  ovale     Allergies  Allergen Reactions  . Ibuprofen Nausea And Vomiting     Current Outpatient Medications:  .  albuterol (PROVENTIL HFA;VENTOLIN HFA) 108 (90 Base) MCG/ACT inhaler, Inhale into the lungs.,  Disp: , Rfl:  .  aspirin EC 81 MG tablet, Take 81 mg by mouth., Disp: , Rfl:  .  atorvastatin (LIPITOR) 40 MG tablet, Take 1 tablet (40 mg total) daily by mouth., Disp: 30 tablet, Rfl: 3 .  Cholecalciferol (VITAMIN D-1000 MAX ST) 1000 units tablet, Take by mouth., Disp: , Rfl:  .  Cyanocobalamin (B-12) 2000 MCG TABS, Take by mouth., Disp: , Rfl:  .  DULoxetine (CYMBALTA) 60 MG capsule, Take 1 capsule (60 mg total) daily by mouth., Disp: 30 capsule, Rfl: 3 .  furosemide (LASIX) 20 MG tablet, Take by mouth., Disp: , Rfl:  .  lisinopril (PRINIVIL,ZESTRIL) 40 MG tablet, Take 1 tablet (40 mg total) by mouth daily., Disp: 30 tablet, Rfl: 5 .  nicotine (NICODERM CQ - DOSED IN MG/24 HOURS) 14 mg/24hr patch, Place 14 mg onto the skin daily., Disp: , Rfl:  .  nitroGLYCERIN (NITROSTAT) 0.4 MG SL tablet, Place under the tongue., Disp: , Rfl:  .  omeprazole (PRILOSEC) 20 MG capsule, , Disp: , Rfl:  .  pregabalin (LYRICA) 75 MG capsule, Take 1 capsule (75 mg total) by mouth 2 (two) times daily., Disp: 60 capsule, Rfl: 2  Review of Systems  Constitutional: Negative for fever.  Cardiovascular: Negative for chest pain.  Gastrointestinal: Negative for abdominal pain and bowel incontinence.  Genitourinary: Negative for bladder incontinence and dysuria.  Musculoskeletal: Positive for back pain.  Neurological: Positive for weakness (s/p CVA) and headaches. Negative for tingling and numbness.    Social History   Tobacco Use  . Smoking status: Current Every Day Smoker    Packs/day: 0.25    Years: 56.00    Pack years: 14.00    Types: Cigarettes  . Smokeless tobacco: Never Used  . Tobacco comment: started smoking at age 51; has decreased cigarette use to 3 cigarettes per day from 1.5 PPD  Substance Use Topics  . Alcohol use: No  Has cut down on smoking  will smoke 2-3 cigs/day on days that are stressful Has not smoked daily since was hospitalized Objective:   BP (!) 168/88 (BP Location: Left Arm,  Patient Position: Sitting, Cuff Size: Normal)   Pulse 80   Temp 97.6 F (36.4 C) (Oral)   Resp 16   SpO2 96%  Vitals:   12/26/17 1441  BP: (!) 168/88  Pulse: 80  Resp: 16  Temp: 97.6 F (36.4 C)  TempSrc: Oral  SpO2: 96%     Physical Exam  Constitutional: He is oriented to person, place, and time. He appears well-developed and well-nourished. No distress.  HENT:  Head: Normocephalic and atraumatic.  Eyes: Conjunctivae are normal. No scleral icterus.  Neck: Neck supple.  Cardiovascular: Normal rate, regular rhythm, normal heart sounds and intact distal pulses.  No murmur heard. Pulmonary/Chest: Effort normal and breath sounds normal. No respiratory distress. He has no wheezes. He has no rales.  Musculoskeletal: He exhibits no edema or deformity.  Diffuse TTP over Lumbar spine and paraspinal musculature to light touch  Lymphadenopathy:    He has no cervical adenopathy.  Neurological: He is alert and oriented to person, place, and time.  Strength in L leg 5/5, R leg 4/5 in knee and hip flexion, 2/5 in ankle dorsiflexion/planter flexion Sensation decreased to light touch  in R leg  Skin: Skin is warm and dry. No rash noted.  Psychiatric: He has a normal mood and affect. His behavior is normal.  Vitals reviewed.       Assessment & Plan:      Problem List Items Addressed This Visit      Cardiovascular and Mediastinum   Hypertension - Primary    Uncontrolled Needs tight control given h/o CAD and AAA and recent CVA Increase lisinopril to 40mg  daily F/u in 6 weeks      Relevant Medications   lisinopril (PRINIVIL,ZESTRIL) 40 MG tablet     Other   Tobacco dependence    Discussed again importance of quitting Patient states he has quit but continues to smoke intermittently Discussed that smoking any cigarettes continues to place him at risk      Relevant Medications   nicotine (NICODERM CQ - DOSED IN MG/24 HOURS) 14 mg/24hr patch   Chronic back pain    Chronic pain  syndrome of entire low back Does have new neuro findings but these are consistent with his deficits from CVA Continue cymbalta D/c celebrex and gabapentin Trial of lyrica instead Patient asks again for opiates, which I reiterated should not be used for his chronic pain, especially with the very high doses he was on in the past States that everything makes him worse and unwilling to try more PT or ESI Referral to PM&R      Relevant Orders   Ambulatory referral to Physical Medicine Rehab   History of CVA (cerebrovascular accident)    Patient was not seen for hospital f/u for this problem as no records were sent and patient did not alert office that this happened ~2 months ago He is working with PT - encouraged him to continue He has remaining deficits Needs good risk factor management Will make sure he has neurology f/u at next visit      Acquired right foot drop    2/2 CVA, not related to back pain Referral to PM&R to see if there are any assistive devices that he may benefit from      Relevant Orders   Ambulatory referral to Physical Medicine Rehab      Return in about 6 weeks (around 02/06/2018) for pain, BP f/u.      The entirety of the information documented in the History of Present Illness, Review of Systems and Physical Exam were personally obtained by me. Portions of this information were initially documented by Irving Burton Ratchford, CMA and reviewed by me for thoroughness and accuracy.    Erasmo Downer, MD, MPH [facility] 12/26/2017 4:14 PM

## 2017-12-30 ENCOUNTER — Telehealth: Payer: Self-pay | Admitting: Family Medicine

## 2017-12-30 NOTE — Telephone Encounter (Signed)
Are there programs for this? Does pt need PT/OT?

## 2017-12-30 NOTE — Telephone Encounter (Signed)
Pt states he is needing assistance with cleaning his house.   States since his stroke he is not able to do it by himself any longer. Pt also states if we could mail the letter to his home he would give it to the home health nurse when she comes out.    Pt would like a call once we mail the letter so he can be looking for it in the mail.

## 2017-12-31 ENCOUNTER — Encounter: Payer: Self-pay | Admitting: Family Medicine

## 2017-12-31 ENCOUNTER — Telehealth: Payer: Self-pay | Admitting: Family Medicine

## 2017-12-31 NOTE — Telephone Encounter (Signed)
Discussed with patient at last visit that he should stop gabapentin while taking Lyrica.  He has only been on Lyrica for ~1wk, so he needs to try it for a longer time.  We also discussed that no narcotics will be given for chronic pain.  He is also being referred to PM&R for further eval/management.  Erasmo Downer, MD, MPH [facility] 12/31/2017 2:39 PM

## 2017-12-31 NOTE — Telephone Encounter (Signed)
Please advise 

## 2017-12-31 NOTE — Telephone Encounter (Signed)
Samantha with [facility] stated she had a visit with pt today and pt informed her that neither pregabalin (LYRICA) 75 MG capsule nor gabapentin (NEURONTIN) 600 MG tablet has been helping with pt's back and leg pain. Lelon Mast advised that pt stated he has continued to take both the medications. Lelon Mast is asking if there is something else pt can try for pain. [facility]. Please advise. Thanks TNP

## 2017-12-31 NOTE — Telephone Encounter (Signed)
Unsure if [facility] RN will do this, but letter completed.  Patient can come to pick up at his convenience  Bacigalupo, Marzella Schlein, MD, MPH [facility] 12/31/2017 11:20 AM

## 2017-12-31 NOTE — Telephone Encounter (Signed)
Tried calling pt; phone kept ringing with no answer. Will try again later.

## 2017-12-31 NOTE — Telephone Encounter (Signed)
Pt advised and letter mailed.

## 2018-01-05 ENCOUNTER — Telehealth: Payer: Self-pay | Admitting: Family Medicine

## 2018-01-05 NOTE — Telephone Encounter (Signed)
Aldrin advised.

## 2018-01-05 NOTE — Telephone Encounter (Signed)
Pt returned call about his medication not helping with his pain. Pt also stated he had spoken to someone in the office about getting someone to come to his home to help with cleaning his home. Please advise. Thanks TNP

## 2018-01-05 NOTE — Telephone Encounter (Signed)
Pt advised. Number I was calling below was incorrect; removed this number from pt's chart. Advised pt no narcotics will be given for leg pain, and to continue Lyrica. Letter was mailed last week. Asked pt to call back if he has not received this letter by the end of the week. May need to re-mail if he has not received the letter regarding help with cleaning house.

## 2018-01-05 NOTE — Telephone Encounter (Signed)
Aldrin with [facility] called and is requesting verbal orders for in home physical therapy as follows:  Continue home health PT for twice a week for 4 weeks.  Please advise. Thanks TNP

## 2018-01-05 NOTE — Telephone Encounter (Signed)
Tried calling pt again. No answer.

## 2018-01-09 ENCOUNTER — Telehealth: Payer: Self-pay

## 2018-01-09 NOTE — Telephone Encounter (Signed)
Samantha with [facility] is with patient there at his house for a visit. He saw Dr Beryle Flock earlier this month and was referred to pain management but when [facility] called pain clinic office they said they have not received anything. I saw that on 12/31/17 information was faxed but I guess they did not receive it. Their fax number, that was given to [facility] when she called, is [phone]. Can you please check on this for the patient. Thank you. And then let patient know. Thank Neta Mends, RMA

## 2018-01-12 NOTE — Telephone Encounter (Signed)
Information refaxed to Dr Yves Dill 01/12/18.Pt advised and given his contact information

## 2018-01-16 ENCOUNTER — Telehealth: Payer: Self-pay | Admitting: Family Medicine

## 2018-01-16 DIAGNOSIS — G8929 Other chronic pain: Secondary | ICD-10-CM

## 2018-01-16 DIAGNOSIS — M545 Low back pain: Principal | ICD-10-CM

## 2018-01-16 NOTE — Telephone Encounter (Signed)
OK to change referral to pain clinic.  Patient did want someone to help with foot weakness as well, which would be dealt with by PM&R.  Erasmo Downer, MD, MPH [facility] 01/16/2018 3:45 PM

## 2018-01-16 NOTE — Telephone Encounter (Signed)
Referral ordered

## 2018-01-16 NOTE — Telephone Encounter (Signed)
Pt refused referral to Dr Yves Dill because he does not prescribe pain meds.Dr Yves Dill suggest referral to pain clinic and pt agrees with this

## 2018-01-16 NOTE — Telephone Encounter (Signed)
FYI

## 2018-01-19 ENCOUNTER — Telehealth: Payer: Self-pay

## 2018-01-19 NOTE — Telephone Encounter (Signed)
Dr. Penelope Coop patient- Patient is requesting a refill on Valacyclovir 1 mg- Take 1 tablet TID last RX was from [facility]. Patient states he has recurrent shingles on lower back. Advised patient he may have to come in for evaluation. He requested a message be sent back first to see if he could just get a refill before coming in for a OV. Please advise.   Pharmacy-[facility] CB#[phone]

## 2018-01-19 NOTE — Telephone Encounter (Signed)
Yes please have patient come in to be evaluated as we have never seen him for this nor prescribed this medication. Thank you.

## 2018-01-20 NOTE — Telephone Encounter (Signed)
Pt advised.  He will call back and schedule an appointment.   Thanks,   -Azzan Butler  

## 2018-01-22 DIAGNOSIS — I10 Essential (primary) hypertension: Secondary | ICD-10-CM | POA: Diagnosis not present

## 2018-01-22 DIAGNOSIS — Z7982 Long term (current) use of aspirin: Secondary | ICD-10-CM | POA: Diagnosis not present

## 2018-01-22 DIAGNOSIS — Z9181 History of falling: Secondary | ICD-10-CM | POA: Diagnosis not present

## 2018-01-22 DIAGNOSIS — I2510 Atherosclerotic heart disease of native coronary artery without angina pectoris: Secondary | ICD-10-CM | POA: Diagnosis not present

## 2018-01-22 DIAGNOSIS — Z7951 Long term (current) use of inhaled steroids: Secondary | ICD-10-CM | POA: Diagnosis not present

## 2018-01-22 DIAGNOSIS — E785 Hyperlipidemia, unspecified: Secondary | ICD-10-CM | POA: Diagnosis not present

## 2018-01-22 DIAGNOSIS — I69351 Hemiplegia and hemiparesis following cerebral infarction affecting right dominant side: Secondary | ICD-10-CM | POA: Diagnosis not present

## 2018-01-28 DIAGNOSIS — Z9181 History of falling: Secondary | ICD-10-CM | POA: Diagnosis not present

## 2018-01-28 DIAGNOSIS — I10 Essential (primary) hypertension: Secondary | ICD-10-CM | POA: Diagnosis not present

## 2018-01-28 DIAGNOSIS — I2510 Atherosclerotic heart disease of native coronary artery without angina pectoris: Secondary | ICD-10-CM | POA: Diagnosis not present

## 2018-01-28 DIAGNOSIS — E785 Hyperlipidemia, unspecified: Secondary | ICD-10-CM | POA: Diagnosis not present

## 2018-01-28 DIAGNOSIS — Z7982 Long term (current) use of aspirin: Secondary | ICD-10-CM | POA: Diagnosis not present

## 2018-01-28 DIAGNOSIS — Z7951 Long term (current) use of inhaled steroids: Secondary | ICD-10-CM | POA: Diagnosis not present

## 2018-01-28 DIAGNOSIS — I69351 Hemiplegia and hemiparesis following cerebral infarction affecting right dominant side: Secondary | ICD-10-CM | POA: Diagnosis not present

## 2018-01-29 DIAGNOSIS — I10 Essential (primary) hypertension: Secondary | ICD-10-CM | POA: Diagnosis not present

## 2018-01-29 DIAGNOSIS — Z7951 Long term (current) use of inhaled steroids: Secondary | ICD-10-CM | POA: Diagnosis not present

## 2018-01-29 DIAGNOSIS — E785 Hyperlipidemia, unspecified: Secondary | ICD-10-CM | POA: Diagnosis not present

## 2018-01-29 DIAGNOSIS — I2510 Atherosclerotic heart disease of native coronary artery without angina pectoris: Secondary | ICD-10-CM | POA: Diagnosis not present

## 2018-01-29 DIAGNOSIS — Z7982 Long term (current) use of aspirin: Secondary | ICD-10-CM | POA: Diagnosis not present

## 2018-01-29 DIAGNOSIS — I69351 Hemiplegia and hemiparesis following cerebral infarction affecting right dominant side: Secondary | ICD-10-CM | POA: Diagnosis not present

## 2018-01-29 DIAGNOSIS — Z9181 History of falling: Secondary | ICD-10-CM | POA: Diagnosis not present

## 2018-01-30 ENCOUNTER — Telehealth: Payer: Self-pay | Admitting: Family Medicine

## 2018-01-30 NOTE — Telephone Encounter (Signed)
Approved.  Erasmo Downer, MD, MPH [facility] 01/30/2018 4:55 PM

## 2018-01-30 NOTE — Telephone Encounter (Signed)
Logan Vazquez with [facility] [phone] requesting verbal order for PT for twice a week for four weeks.

## 2018-01-30 NOTE — Telephone Encounter (Signed)
Please review. Thanks!  

## 2018-02-02 DIAGNOSIS — Z7982 Long term (current) use of aspirin: Secondary | ICD-10-CM | POA: Diagnosis not present

## 2018-02-02 DIAGNOSIS — Z9181 History of falling: Secondary | ICD-10-CM | POA: Diagnosis not present

## 2018-02-02 DIAGNOSIS — Z7951 Long term (current) use of inhaled steroids: Secondary | ICD-10-CM | POA: Diagnosis not present

## 2018-02-02 DIAGNOSIS — I2510 Atherosclerotic heart disease of native coronary artery without angina pectoris: Secondary | ICD-10-CM | POA: Diagnosis not present

## 2018-02-02 DIAGNOSIS — I10 Essential (primary) hypertension: Secondary | ICD-10-CM | POA: Diagnosis not present

## 2018-02-02 DIAGNOSIS — I69351 Hemiplegia and hemiparesis following cerebral infarction affecting right dominant side: Secondary | ICD-10-CM | POA: Diagnosis not present

## 2018-02-02 DIAGNOSIS — E785 Hyperlipidemia, unspecified: Secondary | ICD-10-CM | POA: Diagnosis not present

## 2018-02-03 ENCOUNTER — Ambulatory Visit
Payer: Medicare Other | Attending: Student in an Organized Health Care Education/Training Program | Admitting: Student in an Organized Health Care Education/Training Program

## 2018-02-03 ENCOUNTER — Other Ambulatory Visit: Payer: Self-pay

## 2018-02-03 ENCOUNTER — Encounter: Payer: Self-pay | Admitting: Student in an Organized Health Care Education/Training Program

## 2018-02-03 VITALS — BP 148/86 | HR 78 | Temp 97.8°F | Ht 69.0 in | Wt 152.0 lb

## 2018-02-03 DIAGNOSIS — M21371 Foot drop, right foot: Secondary | ICD-10-CM | POA: Diagnosis not present

## 2018-02-03 DIAGNOSIS — G894 Chronic pain syndrome: Secondary | ICD-10-CM | POA: Diagnosis not present

## 2018-02-03 DIAGNOSIS — Z981 Arthrodesis status: Secondary | ICD-10-CM | POA: Diagnosis not present

## 2018-02-03 DIAGNOSIS — G8929 Other chronic pain: Secondary | ICD-10-CM | POA: Diagnosis not present

## 2018-02-03 DIAGNOSIS — R531 Weakness: Secondary | ICD-10-CM | POA: Diagnosis not present

## 2018-02-03 DIAGNOSIS — J449 Chronic obstructive pulmonary disease, unspecified: Secondary | ICD-10-CM | POA: Diagnosis not present

## 2018-02-03 DIAGNOSIS — F329 Major depressive disorder, single episode, unspecified: Secondary | ICD-10-CM | POA: Insufficient documentation

## 2018-02-03 DIAGNOSIS — F419 Anxiety disorder, unspecified: Secondary | ICD-10-CM | POA: Insufficient documentation

## 2018-02-03 DIAGNOSIS — Z8673 Personal history of transient ischemic attack (TIA), and cerebral infarction without residual deficits: Secondary | ICD-10-CM | POA: Diagnosis not present

## 2018-02-03 DIAGNOSIS — Z7982 Long term (current) use of aspirin: Secondary | ICD-10-CM | POA: Insufficient documentation

## 2018-02-03 DIAGNOSIS — M5136 Other intervertebral disc degeneration, lumbar region: Secondary | ICD-10-CM

## 2018-02-03 DIAGNOSIS — I25119 Atherosclerotic heart disease of native coronary artery with unspecified angina pectoris: Secondary | ICD-10-CM | POA: Insufficient documentation

## 2018-02-03 DIAGNOSIS — I714 Abdominal aortic aneurysm, without rupture: Secondary | ICD-10-CM | POA: Diagnosis not present

## 2018-02-03 DIAGNOSIS — Z79899 Other long term (current) drug therapy: Secondary | ICD-10-CM | POA: Insufficient documentation

## 2018-02-03 DIAGNOSIS — I10 Essential (primary) hypertension: Secondary | ICD-10-CM | POA: Diagnosis not present

## 2018-02-03 DIAGNOSIS — M5441 Lumbago with sciatica, right side: Secondary | ICD-10-CM

## 2018-02-03 DIAGNOSIS — M5442 Lumbago with sciatica, left side: Secondary | ICD-10-CM | POA: Insufficient documentation

## 2018-02-03 DIAGNOSIS — F17210 Nicotine dependence, cigarettes, uncomplicated: Secondary | ICD-10-CM | POA: Diagnosis not present

## 2018-02-03 NOTE — Telephone Encounter (Signed)
L/M for aldrin as below.

## 2018-02-03 NOTE — Progress Notes (Signed)
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 coronary artery disease and lack of benefit with Celebrex, I will have him discontinue this medication.  Patient should continue his Cymbalta 60 mg daily, Lyrica 75 mg twice daily.  Plan: -UDS today.  Should be negative for all controlled/illicit substances. -History of L4-S1 posterior fusion, L4-L5 laminectomy greater than 20 years ago with worsening right lower ext pain and weakness.  Recommend  lumbar MRI without contrast. -Continue Cymbalta and Lyrica as prescribed -Discussed patient clinic policy as well as low-dose opioid therapy (MME<60) if he is accepted as a patient for chronic opioid therapy here. -Follow-up after lumbar MRI to discuss treatment plan.  Note: Please be advised that as per protocol, today's visit has been an evaluation only. We have not taken over the patient's controlled substance management.  Ordered Lab-work, Procedure(s), Referral(s), & Consult(s): Orders Placed This Encounter  Procedures  . MR LUMBAR SPINE WO CONTRAST  . Compliance Drug Analysis, Ur   Pharmacotherapy  (current): Medications ordered:  No orders of the defined types were placed in this encounter.  Medications administered during this visit: Jim L. Ferrara had no medications administered during this visit.   Pharmacological management options:  Opioid Analgesics: The patient was informed that there is no guarantee that he would be a candidate for opioid analgesics. The decision will be made following CDC guidelines. This decision will be based on the results of diagnostic studies, as well as Logan Vazquez risk profile.   Membrane stabilizer: To be determined at a later time  Muscle relaxant: To be determined at a later time  NSAID: To be determined at a later time  Other analgesic(s): To be determined at a later time   Interventional management options: Logan Vazquez was informed that there is no guarantee that he would be a candidate for interventional therapies. The decision will be based on the results of diagnostic studies, as well as Logan Vazquez risk profile.  Procedure(s) under consideration:  -Lumbar/caudal epidural steroid injection -Lumbar facet medial branch nerve block -SI joint injection -Spinal cord stimulator trial   Provider-requested follow-up: Return in about 4 weeks (around 03/03/2018) for After Imaging.  Future Appointments  Date Time Provider [place]  02/06/2018  4:00 PM Virginia Crews, MD [facility] None  02/23/2018  3:00 PM [facility]  03/03/2018  1:30 PM Gillis Santa, MD [facility] None  04/09/2019 10:30 AM [facility]ASC 2 [facility] None  04/09/2019 11:30 AM Dew, Erskine Squibb, MD [facility] None    Primary Care Physician: Virginia Crews, MD Location: [facility] Note by: Gillis Santa, M.D, Date: 02/03/2018; Time: 2:58 PM

## 2018-02-03 NOTE — Patient Instructions (Signed)
____________________________________________________________________________________________ ° °Genicular Nerve Block ° °What is a genicular nerve block? °A genicular nerve block is the injection of a local anesthetic to block the nerves that transmits pain from the knee. ° °What is the purpose of a facet nerve block? °A genicular nerve block is a diagnostic procedure to determine if the pathologic changes (i.e. arthritis, meniscal tears, etc) and inflammation within the knee joint is the source of your knee pain. It also confirms that the knee pain will respond well to the actual treatment procedure. If a genicular nerve block works, it will give you relief for several hours. After that, the pain is expected to return to normal. This test is always performed twice (usually a week or two apart) because two successful tests are required to move onto treatment. If both diagnostic tests are positive, then we schedule a treatment called radiofrequency (RF) ablation. In this procedure, the same nerves are cauterized, which typically leads to pain relief for 4 -18 months. If this process works well for one knee, it can be performed on the other knee if needed. ° °How is the procedure performed? °You will be placed on the procedure table. The injection site is sterilized with either iodine or chlorhexadine. The site to be injected is numbed with a local anesthetic, and a needle is directed to the target area. X-ray guidance is used to ensure proper placement and positioning of the needle. When the needle is properly positioned near the genicular nerve, local anesthetic is injected to numb that nerve. This will be repeated at multiple sites around the knee to block all genicular nerves. ° °Will the procedure be painful? °The injection can be painful and we therefore provide the option of receiving IV sedation. IV sedation, combined with local anesthetic, can make the injection nearly pain free. It allows you to remain very  still during the procedure, which can also make the injection easier, faster, and more successful. If you decide to have IV sedation, you must have a driver to get you home safely afterwards. In addition, you cannot have anything to eat or drink within 8 hours of your appointment (clear liquids are allowed until 3 hours before the procedure). If you take medications for diabetes, these medications may need to be adjusted the morning of the procedure. Your primary care physician can help you with this adjustment. ° °What are the discharge instructions? °If you received IV sedation do not drive or operate machinery for at least 24 hours after the procedure. You may return to work the next day following your procedure. You may resume your normal diet immediately. Do not engage in any strenuous activity for 24 hours. You should, however, engage in moderate activity that typically causes your ususal pain. If the block works, those activities should not be painful for several hours after the injection. Do not take a bath, swim, or use a hot tub for 24 hours (you may take a shower). Call the office if you have any of the following: severe pain afterwards (different than your usual symptoms), redness/swelling/discharge at the injection site(s), fevers/chills, difficulty with bowel or bladder functions. ° °What are the risks and side effects? °The complication rate for this procedure is very low. Whenever a needle enters the skin, bleeding or infection can occur. Some other serious but extremely rare risks include paralysis and death. °You may have an allergic reaction to any of the medications used. If you have a known allergy to any medications, especially local anesthetics, notify   our staff before the procedure takes place. °You may experience any of the following side effects up to 4 - 6 hours after the procedure: °• Leg muscle weakness or numbness may occur due to the local anesthetic affecting the nerves that control  your legs (this is a temporary affect and it is not paralysis). If you have any leg weakness or numbness, walk only with assistance in order to prevent falls and injury. Your leg strength will return slowly and completely. °• Dizziness may occur due to a decrease in your blood pressure. If this occurs, remain in a seated or lying position. Gradually sit up, and then stand after at least 10 minutes of sitting. °• Mild headaches may occur. Drink fluids and take pain medications if needed. If the headaches persist or become severe, call the office. °• Mild discomfort at the injection site can occur. This typically lasts for a few hours but can persist for a couple days. If this occurs, take anti-inflammatories or pain medications, apply ice to the area the day of the procedure. If it persists, apply moist heat in the day(s) following. ° °The side effects listed above can be normal. They are not dangerous and will resolve on their own. If, however, you experience any of the following, a complication may have occurred and you should either contact your doctor. If he is not readily available, then you should proceed to the closest urgent care center for evaluation: °• Severe or progressive pain at the injection site(s) °• Arm or leg weakness that progressively worsens or persists for longer than 8 hours °• Severe or progressive redness, swelling, or discharge from the injections site(s) °• Fevers, chills, nausea, or vomiting °• Bowel or bladder dysfunction (i.e. inability to urinate or pass stool or difficulty controlling either) ° °How long does it take for the procedure to work? °You should feel relief from your usual pain within the first hour. Again, this is only expected to last for several hours, at the most. Remember, you may be sore in the middle part of your back from the needles, and you must distinguish this from your usual  pain. °____________________________________________________________________________________________ ° ° °

## 2018-02-03 NOTE — Progress Notes (Signed)
Safety precautions to be maintained throughout the outpatient stay will include: orient to surroundings, keep bed in low position, maintain call bell within reach at all times, provide assistance with transfer out of bed and ambulation.  

## 2018-02-04 ENCOUNTER — Telehealth: Payer: Self-pay | Admitting: Family Medicine

## 2018-02-04 DIAGNOSIS — Z7982 Long term (current) use of aspirin: Secondary | ICD-10-CM | POA: Diagnosis not present

## 2018-02-04 DIAGNOSIS — Z7951 Long term (current) use of inhaled steroids: Secondary | ICD-10-CM | POA: Diagnosis not present

## 2018-02-04 DIAGNOSIS — Z8673 Personal history of transient ischemic attack (TIA), and cerebral infarction without residual deficits: Secondary | ICD-10-CM

## 2018-02-04 DIAGNOSIS — I2510 Atherosclerotic heart disease of native coronary artery without angina pectoris: Secondary | ICD-10-CM | POA: Diagnosis not present

## 2018-02-04 DIAGNOSIS — M21371 Foot drop, right foot: Secondary | ICD-10-CM

## 2018-02-04 DIAGNOSIS — Z9181 History of falling: Secondary | ICD-10-CM | POA: Diagnosis not present

## 2018-02-04 DIAGNOSIS — I10 Essential (primary) hypertension: Secondary | ICD-10-CM | POA: Diagnosis not present

## 2018-02-04 DIAGNOSIS — M5136 Other intervertebral disc degeneration, lumbar region: Secondary | ICD-10-CM

## 2018-02-04 DIAGNOSIS — I69351 Hemiplegia and hemiparesis following cerebral infarction affecting right dominant side: Secondary | ICD-10-CM | POA: Diagnosis not present

## 2018-02-04 DIAGNOSIS — E785 Hyperlipidemia, unspecified: Secondary | ICD-10-CM | POA: Diagnosis not present

## 2018-02-04 NOTE — Telephone Encounter (Signed)
Please review

## 2018-02-04 NOTE — Telephone Encounter (Signed)
Pt is requesting referral to have a brace placed on right leg.He states he talked with you about this at last office visit

## 2018-02-05 NOTE — Telephone Encounter (Signed)
He was referred to PM&R for this and he refused appointment when he was called about it because they do not prescribe narcotics.  We can place another referral to PM&R if he wishes.  Erasmo Downer, MD, MPH [facility] 02/05/2018 8:23 AM

## 2018-02-05 NOTE — Telephone Encounter (Signed)
Pt does agree to referral now. Will refer back to Dr. Yves Dill.

## 2018-02-06 ENCOUNTER — Ambulatory Visit: Payer: Self-pay | Admitting: Family Medicine

## 2018-02-11 DIAGNOSIS — E785 Hyperlipidemia, unspecified: Secondary | ICD-10-CM | POA: Diagnosis not present

## 2018-02-11 DIAGNOSIS — Z7982 Long term (current) use of aspirin: Secondary | ICD-10-CM | POA: Diagnosis not present

## 2018-02-11 DIAGNOSIS — Z9181 History of falling: Secondary | ICD-10-CM | POA: Diagnosis not present

## 2018-02-11 DIAGNOSIS — I2510 Atherosclerotic heart disease of native coronary artery without angina pectoris: Secondary | ICD-10-CM | POA: Diagnosis not present

## 2018-02-11 DIAGNOSIS — I69351 Hemiplegia and hemiparesis following cerebral infarction affecting right dominant side: Secondary | ICD-10-CM | POA: Diagnosis not present

## 2018-02-11 DIAGNOSIS — I10 Essential (primary) hypertension: Secondary | ICD-10-CM | POA: Diagnosis not present

## 2018-02-11 DIAGNOSIS — Z7951 Long term (current) use of inhaled steroids: Secondary | ICD-10-CM | POA: Diagnosis not present

## 2018-02-13 ENCOUNTER — Telehealth: Payer: Self-pay | Admitting: Family Medicine

## 2018-02-13 NOTE — Telephone Encounter (Signed)
Noted. Patient is following with pain management.  Erasmo Downer, MD, MPH [facility] 02/13/2018 4:22 PM

## 2018-02-13 NOTE — Telephone Encounter (Signed)
Please review

## 2018-02-13 NOTE — Telephone Encounter (Signed)
Verneda Skill Physical Therapist with [facility] wanted to advise that pt missed his physical therapy visit today with Fayrene Fearing. Fayrene Fearing stated that pt reported he was in to much pain for PT today. Please advise. Thanks TNP

## 2018-02-23 ENCOUNTER — Ambulatory Visit
Admission: RE | Admit: 2018-02-23 | Discharge: 2018-02-23 | Disposition: A | Discharge status: Home / Self Care | Payer: Medicare HMO | Source: Ambulatory Visit | Attending: Student in an Organized Health Care Education/Training Program | Admitting: Student in an Organized Health Care Education/Training Program

## 2018-02-23 DIAGNOSIS — Z981 Arthrodesis status: Secondary | ICD-10-CM

## 2018-02-24 ENCOUNTER — Telehealth: Payer: Self-pay | Admitting: *Deleted

## 2018-02-27 ENCOUNTER — Other Ambulatory Visit: Payer: Self-pay | Admitting: Family Medicine

## 2018-02-27 NOTE — Telephone Encounter (Signed)
Patient is requesting a refill on the following medication  pregabalin (LYRICA) 75 MG capsule   He uses [facility]

## 2018-03-02 MED ORDER — PREGABALIN 75 MG PO CAPS: 75.0000 mg | ORAL_CAPSULE | Freq: Two times a day (BID) | ORAL | 1 refills | Status: DC

## 2018-03-02 NOTE — Addendum Note (Signed)
Addended by: Erasmo Downer on: 03/02/2018 10:11 AM   Modules accepted: Orders

## 2018-03-03 ENCOUNTER — Encounter: Payer: Medicare Other | Admitting: Student in an Organized Health Care Education/Training Program

## 2018-03-04 ENCOUNTER — Other Ambulatory Visit: Payer: Self-pay | Admitting: Family Medicine

## 2018-03-04 NOTE — Telephone Encounter (Signed)
It looks like I sent this in 2 days ago.  Can you check with the pharmacy to see if they received that?  Erasmo Downer, MD, MPH [facility] 03/04/2018 9:03 AM

## 2018-03-04 NOTE — Telephone Encounter (Signed)
Verified that pharmacy did receive this rx on 03/02/2018.

## 2018-03-04 NOTE — Telephone Encounter (Signed)
[facility] faxed a refill request for the following medication. Thanks CC  pregabalin (LYRICA) 75 MG capsule

## 2018-03-05 ENCOUNTER — Ambulatory Visit
Admission: RE | Admit: 2018-03-05 | Discharge: 2018-03-05 | Disposition: A | Discharge status: Home / Self Care | Payer: Medicare HMO | Source: Ambulatory Visit | Attending: Student in an Organized Health Care Education/Training Program | Admitting: Student in an Organized Health Care Education/Training Program

## 2018-03-05 DIAGNOSIS — Z981 Arthrodesis status: Secondary | ICD-10-CM | POA: Insufficient documentation

## 2018-03-05 DIAGNOSIS — I714 Abdominal aortic aneurysm, without rupture: Secondary | ICD-10-CM | POA: Insufficient documentation

## 2018-03-05 DIAGNOSIS — I219 Acute myocardial infarction, unspecified: Secondary | ICD-10-CM | POA: Diagnosis not present

## 2018-03-05 DIAGNOSIS — M5126 Other intervertebral disc displacement, lumbar region: Secondary | ICD-10-CM | POA: Insufficient documentation

## 2018-03-05 DIAGNOSIS — M48061 Spinal stenosis, lumbar region without neurogenic claudication: Secondary | ICD-10-CM | POA: Insufficient documentation

## 2018-03-10 ENCOUNTER — Encounter: Payer: Self-pay | Admitting: Student in an Organized Health Care Education/Training Program

## 2018-03-10 ENCOUNTER — Ambulatory Visit
Payer: Medicare HMO | Attending: Student in an Organized Health Care Education/Training Program | Admitting: Student in an Organized Health Care Education/Training Program

## 2018-03-10 ENCOUNTER — Other Ambulatory Visit: Payer: Self-pay

## 2018-03-10 VITALS — BP 158/84 | HR 88 | Temp 97.5°F | Resp 16 | Ht 69.0 in | Wt 150.0 lb

## 2018-03-10 DIAGNOSIS — Z79899 Other long term (current) drug therapy: Secondary | ICD-10-CM | POA: Diagnosis not present

## 2018-03-10 DIAGNOSIS — G8929 Other chronic pain: Secondary | ICD-10-CM | POA: Diagnosis not present

## 2018-03-10 DIAGNOSIS — G894 Chronic pain syndrome: Secondary | ICD-10-CM

## 2018-03-10 DIAGNOSIS — Z981 Arthrodesis status: Secondary | ICD-10-CM

## 2018-03-10 DIAGNOSIS — I714 Abdominal aortic aneurysm, without rupture: Secondary | ICD-10-CM | POA: Diagnosis not present

## 2018-03-10 DIAGNOSIS — F17210 Nicotine dependence, cigarettes, uncomplicated: Secondary | ICD-10-CM | POA: Diagnosis not present

## 2018-03-10 DIAGNOSIS — M5442 Lumbago with sciatica, left side: Secondary | ICD-10-CM | POA: Diagnosis not present

## 2018-03-10 DIAGNOSIS — F329 Major depressive disorder, single episode, unspecified: Secondary | ICD-10-CM | POA: Diagnosis not present

## 2018-03-10 DIAGNOSIS — M21371 Foot drop, right foot: Secondary | ICD-10-CM | POA: Diagnosis not present

## 2018-03-10 DIAGNOSIS — M5136 Other intervertebral disc degeneration, lumbar region: Secondary | ICD-10-CM | POA: Diagnosis not present

## 2018-03-10 DIAGNOSIS — F419 Anxiety disorder, unspecified: Secondary | ICD-10-CM | POA: Insufficient documentation

## 2018-03-10 DIAGNOSIS — Z8673 Personal history of transient ischemic attack (TIA), and cerebral infarction without residual deficits: Secondary | ICD-10-CM | POA: Insufficient documentation

## 2018-03-10 DIAGNOSIS — I10 Essential (primary) hypertension: Secondary | ICD-10-CM | POA: Insufficient documentation

## 2018-03-10 DIAGNOSIS — Z7982 Long term (current) use of aspirin: Secondary | ICD-10-CM | POA: Diagnosis not present

## 2018-03-10 DIAGNOSIS — M5441 Lumbago with sciatica, right side: Secondary | ICD-10-CM | POA: Insufficient documentation

## 2018-03-10 DIAGNOSIS — J449 Chronic obstructive pulmonary disease, unspecified: Secondary | ICD-10-CM | POA: Insufficient documentation

## 2018-03-10 DIAGNOSIS — Z886 Allergy status to analgesic agent status: Secondary | ICD-10-CM | POA: Diagnosis not present

## 2018-03-10 DIAGNOSIS — M545 Low back pain: Secondary | ICD-10-CM | POA: Diagnosis present

## 2018-03-10 DIAGNOSIS — R531 Weakness: Secondary | ICD-10-CM

## 2018-03-10 NOTE — Progress Notes (Signed)
bPatient's Name: Logan Vazquez  MRN: [id number]  Referring Provider: Virginia Crews, MD  DOB: 21-Jan-1946  PCP: Virginia Crews, MD  DOS: 03/10/2018  Note by: Gillis Santa, MD  Service setting: Ambulatory outpatient  Specialty: Interventional Pain Management  Location: [facility] (AMB) Pain Management Facility    Patient type: Established   Primary Reason(s) for Visit: Encounter for evaluation before starting new chronic pain management plan of care (Level of risk: moderate) CC: Back Pain (lower)  HPI  Logan Vazquez is a 72 y.o. year old, male patient, who comes today for a follow-up evaluation to review the test results and decide on a treatment plan. He has Hypertension; Tobacco dependence; AAA (abdominal aortic aneurysm) without rupture ([place]); Chronic back pain; Depression, recurrent (HCC); COPD (chronic obstructive pulmonary disease) ([place]); Anxiety; Coronary artery disease involving native coronary artery of native heart with angina pectoris ([place]); History of CVA (cerebrovascular accident); Acquired right foot drop; History of lumbar spinal fusion; and Lumbar degenerative disc disease on their problem list. His primarily concern today is the Back Pain (lower)  Pain Assessment: Location: Lower Back Radiating: radiates through both hips down the backs of both legs to both feet including all toes Onset: More than a month ago Duration: Chronic pain Quality: Constant, Stabbing Severity: 8 /10 (self-reported pain score)  Note: Reported level is inconsistent with clinical observations. Clinically the patient looks like a 4/10 A 4/10 is viewed as "Moderately Severe" and described as impossible to ignore for more than a few minutes. With effort, patients may still be able to manage work or participate in some social activities. Very difficult to concentrate. Signs of autonomic nervous system discharge are evident: dilated pupils (mydriasis); mild sweating (diaphoresis); sleep interference. Heart rate  becomes elevated (>115 bpm). Diastolic blood pressure (lower number) rises above 100 mmHg. Patients find relief in laying down and not moving.       When using our objective Pain Scale, levels between 6 and 10/10 are said to belong in an emergency room, as it progressively worsens from a 6/10, described as severely limiting, requiring emergency care not usually available at an outpatient pain management facility. At a 6/10 level, communication becomes difficult and requires great effort. Assistance to reach the emergency department may be required. Facial flushing and profuse sweating along with potentially dangerous increases in heart rate and blood pressure will be evident. Effect on ADL: unable to bend over and get around like he used to; hard to reach up Timing: Constant Modifying factors: nothing helps  Logan Vazquez comes in today for a follow-up visit after his initial evaluation on 02/24/2018. Today we went over the results of his tests. These were explained in "Layman's terms". During today's appointment we went over my diagnostic impression, as well as the proposed treatment plan.  72 year old male with a history of lumbar spine surgery greater than 20 years ago (L4/L5 laminectomy, L4 through S1 posterior fusion, with unremarkable hardware and incorporated bone grafting), CVA in 2018 that resulted in right-sided weakness who presents with axial low back pain that radiates into his right posterior thigh and also occasional right ankle pain.  Pain has been chronic in nature.  Patient was previously on very high-dose opioid therapy which included OxyContin 80 mg 4 times daily, oxycodone 10 mg up to 6 times a day.  Patient was unable to give urine at last visit.  He presents today, will provide urine today.  Follow-up next week after results from UDS at which point we can  have patient sign opioid contract if appropriate.  Patient does not have an active opioid contract with [place] yet since he has not  submitted urine for baseline urine drug screen.   In considering the treatment plan options, Logan Vazquez was reminded that I no longer take patients for medication management only. I asked him to let me know if he had no intention of taking advantage of the interventional therapies, so that we could make arrangements to provide this space to someone interested. I also made it clear that undergoing interventional therapies for the purpose of getting pain medications is very inappropriate on the part of a patient, and it will not be tolerated in this practice. This type of behavior would suggest true addiction and therefore it requires referral to an addiction specialist.   Further details on both, my assessment(s), as well as the proposed treatment plan, please see below.   Laboratory Chemistry  Inflammation Markers (CRP: Acute Phase) (ESR: Chronic Phase) No results found for: CRP, ESRSEDRATE, LATICACIDVEN                       Rheumatology Markers No results found for: RF, ANA, [facility], LYMEIGGIGMAB, [facility]                      Renal Function Markers Lab Results  Component Value Date   BUN 15 10/23/2017   CREATININE 0.92 10/23/2017                              Hepatic Function Markers Lab Results  Component Value Date   AST 14 10/23/2017   ALT 10 10/23/2017                        Electrolytes Lab Results  Component Value Date   NA 138 10/23/2017   K 4.9 10/23/2017   CL 102 10/23/2017   CALCIUM 9.5 10/23/2017                        Neuropathy Markers No results found for: VITAMINB12, FOLATE, HGBA1C, HIV                      Bone Pathology Markers No results found for: VD25OH, VD125OH2TOT, [id number], [id number], 25OHVITD1, 25OHVITD2, 25OHVITD3, TESTOFREE, TESTOSTERONE                       Coagulation Parameters Lab Results  Component Value Date   PLT 349 10/23/2017                        Cardiovascular Markers Lab Results  Component Value Date   HGB  14.7 10/23/2017   HCT 43.1 10/23/2017                         CA Markers No results found for: CEA, CA125, LABCA2                      Note: Lab results reviewed.  Recent Diagnostic Imaging Review   Lumbosacral Imaging: Lumbar MR wo contrast:  Results for orders placed during the hospital encounter of 02/23/18  MR LUMBAR SPINE WO CONTRAST   Narrative CLINICAL DATA:  Previous fusion in 1985. Now with pain in the hips and legs. Numbness  in the legs.  EXAM: MRI LUMBAR SPINE WITHOUT CONTRAST  TECHNIQUE: Multiplanar, multisequence MR imaging of the lumbar spine was performed. No intravenous contrast was administered.  COMPARISON:  None.  FINDINGS: Segmentation: I do not have plain films for correlation. L5-S1 interspace appears hypoplastic. There may be transitional anatomy.  Alignment: Straightening of the normal lumbar lordosis due to prior fusion L3-S1.  Vertebrae: Chronic T12 compression fracture. The patient has undergone some type of posterior fixation from L3 through S1. There is extensive metallic artifact. I do not have plain film correlates. No pedicle screws are placed. There appears to be solid interbody arthrodesis.  Conus medullaris and cauda equina: Conus extends to the L1 level. Conus and cauda equina appear normal.  Paraspinal and other soft tissues: Renal cystic disease, incompletely evaluated. Abdominal aortic aneurysm, 43 by 43 mm cross-section, with some mural thrombus.  Disc levels:  L1-L2: Disc desiccation and loss of disc height. Central protrusion. Posterior element hypertrophy. Moderate to severe stenosis. BILATERAL subarticular zone narrowing results in L2 nerve root impingement. BILATERAL foraminal narrowing, not clearly compressive.  L2-L3: Disc desiccation, slight loss of disc height. Central disc extrusion. Posterior element hypertrophy. Severe stenosis, probable near complete block with crowding of the cauda equina nerve  roots centrally. Severe BILATERAL L3 nerve root impingement. BILATERAL foraminal narrowing likely affects both L2 nerve roots.  L3-L4:  Post fusion interspace, grossly unremarkable.  L4-L5:  Post fusion interspace, grossly unremarkable.  L5-S1:  Post fusion interspace, grossly unremarkable.  IMPRESSION: Solid appearing L3-S1 arthrodesis. Some type of metallic posterior fixation has been performed.  Severe stenosis at L2-3 related to posterior element hypertrophy and central disc extrusion. BILATERAL L3 and L2 nerve root impingement are likely.  Moderate to severe stenosis L1-2, central protrusion with posterior element hypertrophy, with BILATERAL L2 nerve root impingement.  4.3 cm abdominal aortic aneurysm. Some mural thrombus is present. Recommend followup by [place] in 1 year. This recommendation follows ACR consensus guidelines: White Paper of the ACR Incidental Findings Committee II on Vascular Findings. J Am Coll Radiol 2013; 10:789-794.   Electronically Signed   By: Staci Righter M.D.   On: 03/05/2018 20:16    Complexity Note: Imaging results reviewed. Results shared with Mr. Vonstein, using Layman's terms. Today I personally and independently reviewed the study images pertinent to Mr. Cawley problem.                  Meds   Current Outpatient Medications:  .  albuterol (PROVENTIL HFA;VENTOLIN HFA) 108 (90 Base) MCG/ACT inhaler, Inhale into the lungs., Disp: , Rfl:  .  aspirin EC 81 MG tablet, Take 81 mg by mouth., Disp: , Rfl:  .  atorvastatin (LIPITOR) 40 MG tablet, Take 1 tablet (40 mg total) daily by mouth., Disp: 30 tablet, Rfl: 3 .  Cholecalciferol (VITAMIN D-1000 MAX ST) 1000 units tablet, Take by mouth., Disp: , Rfl:  .  Cyanocobalamin (B-12) 2000 MCG TABS, Take by mouth., Disp: , Rfl:  .  DULoxetine (CYMBALTA) 60 MG capsule, Take 1 capsule (60 mg total) daily by mouth., Disp: 30 capsule, Rfl: 3 .  furosemide (LASIX) 20 MG tablet, Take by mouth., Disp: , Rfl:  .   lisinopril (PRINIVIL,ZESTRIL) 40 MG tablet, Take 1 tablet (40 mg total) by mouth daily., Disp: 30 tablet, Rfl: 5 .  nicotine (NICODERM CQ - DOSED IN MG/24 HOURS) 14 mg/24hr patch, Place 14 mg onto the skin daily., Disp: , Rfl:  .  nitroGLYCERIN (NITROSTAT) 0.4 MG SL tablet, Place  under the tongue., Disp: , Rfl:  .  omeprazole (PRILOSEC) 20 MG capsule, , Disp: , Rfl:  .  pregabalin (LYRICA) 75 MG capsule, Take 1 capsule (75 mg total) by mouth 2 (two) times daily., Disp: 60 capsule, Rfl: 1  ROS  Constitutional: Denies any fever or chills Gastrointestinal: No reported hemesis, hematochezia, vomiting, or acute GI distress Musculoskeletal: Denies any acute onset joint swelling, redness, loss of ROM, or weakness Neurological: No reported episodes of acute onset apraxia, aphasia, dysarthria, agnosia, amnesia, paralysis, loss of coordination, or loss of consciousness  Allergies  Mr. Poynter is allergic to ibuprofen.  [place]  Drug: Mr. Defalco  reports that he does not use drugs. Alcohol:  reports that he does not drink alcohol. Tobacco:  reports that he has been smoking cigarettes.  He has a 14.00 pack-year smoking history. He has never used smokeless tobacco. Medical:  has a past medical history of Arthritis, Asthma, CAD (coronary artery disease), Depression, Emphysema lung ([place]), Glaucoma, and Hypertension. Surgical: Mr. Lloyd  has a past surgical history that includes heart surgery (07/1999); Hip surgery (Left, 1968); Back surgery (1985); Skin graft (1968); and Humerus fracture surgery (1968). Family: family history includes Cancer in his father; Hearing loss in his mother; Heart attack in his brother; Heart disease in his brother; Hypertension in his father and mother; Stroke in his sister.  Constitutional Exam  General appearance: Well nourished, well developed, and well hydrated. In no apparent acute distress Vitals:   03/10/18 1336  BP: (!) 158/84  Pulse: 88  Resp: 16  Temp: (!) 97.5 F  (36.4 C)  TempSrc: Oral  SpO2: 96%  Weight: 150 lb (68 kg)  Height: 5' 9"  (1.753 m)   BMI Assessment: Estimated body mass index is 22.15 kg/m as calculated from the following:   Height as of this encounter: 5' 9"  (1.753 m).   Weight as of this encounter: 150 lb (68 kg).  BMI interpretation table: BMI level Category Range association with higher incidence of chronic pain  <18 kg/m2 Underweight   18.5-24.9 kg/m2 Ideal body weight   25-29.9 kg/m2 Overweight Increased incidence by 20%  30-34.9 kg/m2 Obese (Class I) Increased incidence by 68%  35-39.9 kg/m2 Severe obesity (Class II) Increased incidence by 136%  >40 kg/m2 Extreme obesity (Class III) Increased incidence by 254%   BMI Readings from Last 4 Encounters:  03/10/18 22.15 kg/m  02/03/18 22.45 kg/m  10/23/17 22.00 kg/m  10/08/17 22.30 kg/m   Wt Readings from Last 4 Encounters:  03/10/18 150 lb (68 kg)  02/03/18 152 lb (68.9 kg)  10/23/17 149 lb (67.6 kg)  10/08/17 151 lb (68.5 kg)  Psych/Mental status: Alert, oriented x 3 (person, place, & time)       Eyes: PERLA Respiratory: No evidence of acute respiratory distress  Cervical Spine Area Exam  Skin & Axial Inspection: No masses, redness, edema, swelling, or associated skin lesions Alignment: Symmetrical Functional ROM: Unrestricted ROM      Stability: No instability detected Muscle Tone/Strength: Functionally intact. No obvious neuro-muscular anomalies detected. Sensory (Neurological): Unimpaired Palpation: No palpable anomalies              Upper Extremity (UE) Exam    Side: Right upper extremity  Side: Left upper extremity  Skin & Extremity Inspection: Skin color, temperature, and hair growth are WNL. No peripheral edema or cyanosis. No masses, redness, swelling, asymmetry, or associated skin lesions. No contractures.  Skin & Extremity Inspection: Skin color, temperature, and hair growth are WNL.  No peripheral edema or cyanosis. No masses, redness, swelling,  asymmetry, or associated skin lesions. No contractures.  Functional ROM: Unrestricted ROM          Functional ROM: Unrestricted ROM          Muscle Tone/Strength: Functionally intact. No obvious neuro-muscular anomalies detected.  Muscle Tone/Strength: Functionally intact. No obvious neuro-muscular anomalies detected.  Sensory (Neurological): Unimpaired          Sensory (Neurological): Unimpaired          Palpation: No palpable anomalies              Palpation: No palpable anomalies              Specialized Test(s): Deferred         Specialized Test(s): Deferred          Thoracic Spine Area Exam  Skin & Axial Inspection: No masses, redness, or swelling Alignment: Symmetrical Functional ROM: Unrestricted ROM Stability: No instability detected Muscle Tone/Strength: Functionally intact. No obvious neuro-muscular anomalies detected. Sensory (Neurological): Unimpaired Muscle strength & Tone: No palpable anomalies  Lumbar Spine Area Exam  Skin & Axial Inspection: Well healed scar from previous spine surgery detected Alignment: Asymmetric Functional ROM: Decreased ROM      Stability: No instability detected Muscle Tone/Strength: Functionally intact. No obvious neuro-muscular anomalies detected. Sensory (Neurological): Dermatomal pain pattern Palpation: Complains of area being tender to palpation Bilateral Fist Percussion Test Provocative Tests: Lumbar Hyperextension and rotation test: Positive bilaterally for facet joint pain. Lumbar Lateral bending test: Positive due to fusion restriction. Patrick's Maneuver: evaluation deferred today                    Gait & Posture Assessment  Ambulation: Patient ambulates using a walker Gait: Antalgic Posture: Difficulty standing up straight, due to pain   Lower Extremity Exam    Side: Right lower extremity  Side: Left lower extremity  Skin & Extremity Inspection: Skin color, temperature, and hair growth are WNL. No peripheral edema or cyanosis. No  masses, redness, swelling, asymmetry, or associated skin lesions. No contractures.  Skin & Extremity Inspection: Skin color, temperature, and hair growth are WNL. No peripheral edema or cyanosis. No masses, redness, swelling, asymmetry, or associated skin lesions. No contractures.  Functional ROM: Unrestricted ROM          Functional ROM: Unrestricted ROM          Muscle Tone/Strength: Mild-to-moderate deconditioning  Muscle Tone/Strength: Mild-to-moderate deconditioning  Sensory (Neurological): Unimpaired  Sensory (Neurological): Unimpaired  Palpation: No palpable anomalies  Palpation: No palpable anomalies   Assessment & Plan  Primary Diagnosis & Pertinent Problem List: The primary encounter diagnosis was Chronic pain syndrome. Diagnoses of Lumbar degenerative disc disease, History of CVA (cerebrovascular accident), Right sided weakness, Acquired right foot drop, Chronic bilateral low back pain with bilateral sciatica, and History of lumbar spinal fusion were also pertinent to this visit.  Visit Diagnosis: 1. Chronic pain syndrome   2. Lumbar degenerative disc disease   3. History of CVA (cerebrovascular accident)   4. Right sided weakness   5. Acquired right foot drop   6. Chronic bilateral low back pain with bilateral sciatica   7. History of lumbar spinal fusion   General Recommendations: The pain condition that the patient suffers from is best treated with a multidisciplinary approach that involves an increase in physical activity to prevent de-conditioning and worsening of the pain cycle, as well as psychological counseling (formal and/or informal) to  address the co-morbid psychological affects of pain. Treatment will often involve judicious use of pain medications and interventional procedures to decrease the pain, allowing the patient to participate in the physical activity that will ultimately produce long-lasting pain reductions. The goal of the multidisciplinary approach is to return  the patient to a higher level of overall function and to restore their ability to perform activities of daily living.  72 year old male with a history of lumbar spine surgery greater than 20 years ago (L4/L5 laminectomy, L4 through S1 posterior fusion, with unremarkable hardware and incorporated bone grafting), CVA in 2018 that resulted in right-sided weakness who presents with axial low back pain that radiates into his right posterior thigh and also occasional right ankle pain.  Pain has been chronic in nature.  Patient was previously on very high-dose opioid therapy which included OxyContin 80 mg 4 times daily, oxycodone 10 mg up to 6 times a day. I told the patient that if he is a candidate for opioid therapy pending diagnostic workup, his total morphine milliequivalents would not exceed 60.   Results of patient's lumbar MRI were  reviewed today which showed L3-S1 arthrodesis along with associated severe stenosis at L2/3 and related to posterior element hypertrophy and central disc extrusion as well as bilateral L3 and L2 nerve root impingement.  In addition the patient also has severe lumbar stenosis at L1-2, central protrusion, bilateral L2 nerve root impingement. Furthermore we discussed various interventional options for his symptoms of lumbar radiculopathy including lumbar/caudal ESI.  Patient was unable to give urine at last visit.  He will provide urine today.  Follow-up next week after results from UDS at which point we can have patient sign opioid contract if appropriate.  Patient does not have an active opioid contract with [place] yet since he has not submitted urine for baseline urine drug screen.   Plan of Care  -Continue Lyrica 75 mg BID and Cymbalta 60 mg daily  -Reviewed MRI and  therapeutic options for lumbar radiculopathy including lumbar epidural steroid injection, caudal epidural steroid injection -As below -UDS  Lab-work, procedure(s), and/or referral(s): Orders Placed This  Encounter  Procedures  . Compliance Drug Analysis, Ur    Provider-requested follow-up: Return in about 8 days (around 03/18/2018) for Medication Management. Time Note: Greater than 50% of the 25 minute(s) of face-to-face time spent with Mr. Urbanek, was spent in counseling/coordination of care regarding: Mr. Bergen primary cause of pain, the significance of each one oth the test(s) anomalies and it's corresponding characteristic pain pattern(s), the treatment plan and treatment alternatives. Future Appointments  Date Time Provider [place]  04/09/2019 10:30 AM [facility] VASC 2 [facility] None  04/09/2019 11:30 AM Dew, Erskine Squibb, MD [facility] None    Primary Care Physician: Virginia Crews, MD Location: [facility] Outpatient Pain Management Facility Note by: Gillis Santa, M.D Date: 03/10/2018; Time: 2:15 PM  There are no Patient Instructions on file for this visit.

## 2018-03-10 NOTE — Progress Notes (Signed)
Safety precautions to be maintained throughout the outpatient stay will include: orient to surroundings, keep bed in low position, maintain call bell within reach at all times, provide assistance with transfer out of bed and ambulation.  

## 2018-03-11 ENCOUNTER — Telehealth: Payer: Self-pay

## 2018-03-11 NOTE — Telephone Encounter (Signed)
Please review

## 2018-03-11 NOTE — Telephone Encounter (Signed)
Logan Vazquez [facility] called requesting verbal order to continue home health therapy with frequency of 2x a week for 4 weeks. Logan Vazquez is also requesting a prescription for ankle, foot orthoses for right drop foot. Logan Vazquez request that we fax prescription to  [phone] [facility]

## 2018-03-12 NOTE — Telephone Encounter (Signed)
Called in verbal order and faxed written order for orthoses.

## 2018-03-12 NOTE — Telephone Encounter (Signed)
Rx completed.  Ok to verbal order  Viola Kinnick, Marzella Schlein, MD, MPH [facility] 03/12/2018 9:48 AM

## 2018-03-14 LAB — COMPLIANCE DRUG ANALYSIS, UR

## 2018-03-18 ENCOUNTER — Other Ambulatory Visit: Payer: Self-pay

## 2018-03-18 ENCOUNTER — Ambulatory Visit
Payer: Medicare HMO | Attending: Student in an Organized Health Care Education/Training Program | Admitting: Student in an Organized Health Care Education/Training Program

## 2018-03-18 ENCOUNTER — Encounter: Payer: Self-pay | Admitting: Student in an Organized Health Care Education/Training Program

## 2018-03-18 VITALS — BP 161/90 | HR 84 | Temp 97.9°F | Resp 18 | Ht 69.0 in | Wt 150.0 lb

## 2018-03-18 DIAGNOSIS — F339 Major depressive disorder, recurrent, unspecified: Secondary | ICD-10-CM | POA: Insufficient documentation

## 2018-03-18 DIAGNOSIS — F419 Anxiety disorder, unspecified: Secondary | ICD-10-CM | POA: Insufficient documentation

## 2018-03-18 DIAGNOSIS — Z7982 Long term (current) use of aspirin: Secondary | ICD-10-CM | POA: Insufficient documentation

## 2018-03-18 DIAGNOSIS — Z8673 Personal history of transient ischemic attack (TIA), and cerebral infarction without residual deficits: Secondary | ICD-10-CM | POA: Diagnosis not present

## 2018-03-18 DIAGNOSIS — G894 Chronic pain syndrome: Secondary | ICD-10-CM | POA: Insufficient documentation

## 2018-03-18 DIAGNOSIS — M5442 Lumbago with sciatica, left side: Secondary | ICD-10-CM | POA: Insufficient documentation

## 2018-03-18 DIAGNOSIS — M5136 Other intervertebral disc degeneration, lumbar region: Secondary | ICD-10-CM | POA: Insufficient documentation

## 2018-03-18 DIAGNOSIS — G8929 Other chronic pain: Secondary | ICD-10-CM

## 2018-03-18 DIAGNOSIS — Z886 Allergy status to analgesic agent status: Secondary | ICD-10-CM | POA: Diagnosis not present

## 2018-03-18 DIAGNOSIS — F17210 Nicotine dependence, cigarettes, uncomplicated: Secondary | ICD-10-CM | POA: Diagnosis not present

## 2018-03-18 DIAGNOSIS — Z79899 Other long term (current) drug therapy: Secondary | ICD-10-CM | POA: Insufficient documentation

## 2018-03-18 DIAGNOSIS — M21371 Foot drop, right foot: Secondary | ICD-10-CM

## 2018-03-18 DIAGNOSIS — R531 Weakness: Secondary | ICD-10-CM | POA: Insufficient documentation

## 2018-03-18 DIAGNOSIS — I714 Abdominal aortic aneurysm, without rupture: Secondary | ICD-10-CM | POA: Diagnosis not present

## 2018-03-18 DIAGNOSIS — Z981 Arthrodesis status: Secondary | ICD-10-CM | POA: Insufficient documentation

## 2018-03-18 DIAGNOSIS — J449 Chronic obstructive pulmonary disease, unspecified: Secondary | ICD-10-CM | POA: Diagnosis not present

## 2018-03-18 DIAGNOSIS — M5441 Lumbago with sciatica, right side: Secondary | ICD-10-CM | POA: Insufficient documentation

## 2018-03-18 DIAGNOSIS — I25119 Atherosclerotic heart disease of native coronary artery with unspecified angina pectoris: Secondary | ICD-10-CM | POA: Diagnosis not present

## 2018-03-18 DIAGNOSIS — I10 Essential (primary) hypertension: Secondary | ICD-10-CM | POA: Diagnosis not present

## 2018-03-18 MED ORDER — HYDROCODONE-ACETAMINOPHEN 10-325 MG PO TABS: 1.0000 | ORAL_TABLET | Freq: Three times a day (TID) | ORAL | 0 refills | Status: DC | PRN

## 2018-03-18 NOTE — Progress Notes (Signed)
Safety precautions to be maintained throughout the outpatient stay will include: orient to surroundings, keep bed in low position, maintain call bell within reach at all times, provide assistance with transfer out of bed and ambulation.  

## 2018-03-18 NOTE — Patient Instructions (Addendum)
1. Sign opioid contract 2. Rx for Hydrocodone for 1 month 4. Continue Lyrica and Cymbalta as prescribed by PCP  Prescription given for hydrocodone.

## 2018-03-18 NOTE — Progress Notes (Signed)
Patient's Name: Logan Vazquez  MRN: [id number]  Referring Provider: Virginia Crews, MD  DOB: [birth year]  PCP: Virginia Crews, MD  DOS: 03/18/2018  Note by: Gillis Santa, MD  Service setting: Ambulatory outpatient  Specialty: Interventional Pain Management  Location: [facility] (AMB) Pain Management Facility    Patient type: Established   Primary Reason(s) for Visit: Encounter for evaluation before starting new chronic pain management plan of care (Level of risk: moderate) CC: Back Pain (lower)  HPI  Logan Vazquez is a 72 y.o. year old, male patient, who comes today for a follow-up evaluation to review the test results and decide on a treatment plan. He has Hypertension; Tobacco dependence; AAA (abdominal aortic aneurysm) without rupture ([place]); Chronic back pain; Depression, recurrent (HCC); COPD (chronic obstructive pulmonary disease) ([place]); Anxiety; Coronary artery disease involving native coronary artery of native heart with angina pectoris ([place]); History of CVA (cerebrovascular accident); Acquired right foot drop; History of lumbar spinal fusion; and Lumbar degenerative disc disease on their problem list. His primarily concern today is the Back Pain (lower)  Pain Assessment: Location: Lower Back Radiating: down both legs to ankles Onset: More than a month ago Duration: Chronic pain Quality: Constant, Aching Severity: 8 /10 (self-reported pain score)  Note: Reported level is compatible with observation.                         When using our objective Pain Scale, levels between 6 and 10/10 are said to belong in an emergency room, as it progressively worsens from a 6/10, described as severely limiting, requiring emergency care not usually available at an outpatient pain management facility. At a 6/10 level, communication becomes difficult and requires great effort. Assistance to reach the emergency department may be required. Facial flushing and profuse sweating along with potentially dangerous  increases in heart rate and blood pressure will be evident. Effect on ADL: I dont do anything Timing: Constant Modifying factors: nothing  Logan Vazquez comes in today for a follow-up visit after his initial evaluation on 03/10/2018. Today we went over the results of his tests. These were explained in "Layman's terms". During today's appointment we went over my diagnostic impression, as well as the proposed treatment plan.  In considering the treatment plan options, Logan Vazquez was reminded that I no longer take patients for medication management only. I asked him to let me know if he had no intention of taking advantage of the interventional therapies, so that we could make arrangements to provide this space to someone interested. I also made it clear that undergoing interventional therapies for the purpose of getting pain medications is very inappropriate on the part of a patient, and it will not be tolerated in this practice. This type of behavior would suggest true addiction and therefore it requires referral to an addiction specialist.   Further details on both, my assessment(s), as well as the proposed treatment plan, please see below.  Controlled Substance Pharmacotherapy Assessment REMS (Risk Evaluation and Mitigation Strategy)  Analgesic: We will start with hydrocodone 10 mg 3 times daily as needed, quantity 76-monthMME/day: 30 mg/day. Pill Count: None expected due to no prior prescriptions written by our practice. NMorley Kos RN  03/18/2018 12:59 PM  Sign at close encounter Safety precautions to be maintained throughout the outpatient stay will include: orient to surroundings, keep bed in low position, maintain call bell within reach at all times, provide assistance with transfer out of bed and  ambulation.    Pharmacokinetics: Liberation and absorption (onset of action): WNL Distribution (time to peak effect): WNL Metabolism and excretion (duration of action): WNL          Pharmacodynamics: Desired effects: Analgesia: Logan Vazquez reports >50% benefit. Functional ability: Patient reports that medication allows him to accomplish basic ADLs Clinically meaningful improvement in function (CMIF): Sustained CMIF goals met Perceived effectiveness: Described as relatively effective, allowing for increase in activities of daily living (ADL) Undesirable effects: Side-effects or Adverse reactions: None reported Monitoring: [place] PMP: Online review of the past 2-monthperiod previously conducted. Not applicable at this point since we have not taken over the patient's medication management yet. List of other Serum/Urine Drug Screening Test(s):  No results found for: AMPHSCRSER, BPrichard BENZOSCRSER, COCAINSCRSER, COCAINSCRNUR, [place] THCSCRSER, [place] CANNABQUANT, [place] E[place]hattanList of all UDS test(s) done:  Lab Results  Component Value Date   SUMMARY FINAL 03/10/2018   Last UDS on record: Summary  Date Value Ref Range Status  03/10/2018 FINAL  Final    Comment:    ==================================================================== TOXASSURE COMP DRUG ANALYSIS,UR ==================================================================== Test                             Result       Flag       Units Drug Present and Declared for Prescription Verification   Pregabalin                     PRESENT      EXPECTED   Duloxetine                     PRESENT      EXPECTED Drug Present not Declared for Prescription Verification   Mirtazapine                    PRESENT      UNEXPECTED Drug Absent but Declared for Prescription Verification   Salicylate                     Not Detected UNEXPECTED    Aspirin, as indicated in the declared medication list, is not    always detected even when used as directed. ==================================================================== Test                      Result    Flag   Units      Ref Range    Creatinine              159              mg/dL      >=20 ==================================================================== Declared Medications:  The flagging and interpretation on this report are based on the  following declared medications.  Unexpected results may arise from  inaccuracies in the declared medications.  **Note: The testing scope of this panel includes these medications:  Duloxetine  Pregabalin  **Note: The testing scope of this panel does not include small to  moderate amounts of these reported medications:  Aspirin (Aspirin 81)  **Note: The testing scope of this panel does not include following  reported medications:  Albuterol  Atorvastatin  Cholecalciferol  Furosemide  Lisinopril  Nicotine  Nitroglycerin (NTG)  Omeprazole  Vitamin B12 ==================================================================== For clinical consultation, please call ([phone] ====================================================================    UDS interpretation: No unexpected findings.  Medication Assessment Form: Patient introduced to form today Treatment compliance: Treatment may start today if patient agrees with proposed plan. Evaluation of compliance is not applicable at this point Risk Assessment Profile: Aberrant behavior: See initial evaluations. None observed or detected today Comorbid factors increasing risk of overdose: See initial evaluation. No additional risks detected today Medical Psychology Evaluation: Moderate Risk Opioid Risk Tool - 03/18/18 1257      Family History of Substance Abuse   Alcohol  Negative    Illegal Drugs  Negative    Rx Drugs  Negative      Personal History of Substance Abuse   Alcohol  Negative    Illegal Drugs  Negative    Rx Drugs  Negative      Age   Age between 67-45 years   No      History of Preadolescent Sexual Abuse   History of Preadolescent Sexual Abuse  Negative or Male      Psychological Disease    Psychological Disease  Negative    Depression  Positive      Total Score   Opioid Risk Tool Scoring  1    Opioid Risk Interpretation  Low Risk      ORT Scoring interpretation table:  Score <3 = Low Risk for SUD  Score between 4-7 = Moderate Risk for SUD  Score >8 = High Risk for Opioid Abuse   Risk Mitigation Strategies:  Patient opioid safety counseling: Completed today. Counseling provided to patient as per "Patient Counseling Document". Document signed by patient, attesting to counseling and understanding Patient-Prescriber Agreement (PPA): Obtained today.  Controlled substance notification to other providers: Written and sent today.  Pharmacologic Plan: Today we may be taking over the patient's pharmacological regimen. See below.             Laboratory Chemistry  Inflammation Markers (CRP: Acute Phase) (ESR: Chronic Phase) No results found for: CRP, ESRSEDRATE, LATICACIDVEN                       Rheumatology Markers No results found for: RF, ANA, Rush Barer, LYMEIGGIGMAB, [facility]                      Renal Function Markers Lab Results  Component Value Date   BUN 15 10/23/2017   CREATININE 0.92 10/23/2017                              Hepatic Function Markers Lab Results  Component Value Date   AST 14 10/23/2017   ALT 10 10/23/2017                        Electrolytes Lab Results  Component Value Date   NA 138 10/23/2017   K 4.9 10/23/2017   CL 102 10/23/2017   CALCIUM 9.5 10/23/2017                        Neuropathy Markers No results found for: VITAMINB12, FOLATE, HGBA1C, HIV                      Bone Pathology Markers No results found for: [place], [id number], AT5573UK0, [id number], 25OHVITD1, 25OHVITD2, 25OHVITD3, TESTOFREE, TESTOSTERONE                       Coagulation Parameters Lab Results  Component Value Date   PLT 349 10/23/2017                        Cardiovascular Markers Lab Results  Component Value Date   HGB 14.7 10/23/2017    HCT 43.1 10/23/2017                         CA Markers No results found for: CEA, CA125, LABCA2                      Note: Lab results reviewed.  Recent Diagnostic Imaging Review   Lumbosacral Imaging: Lumbar MR wo contrast:  Results for orders placed during the hospital encounter of 02/23/18  MR LUMBAR SPINE WO CONTRAST   Narrative CLINICAL DATA:  Previous fusion in 1985. Now with pain in the hips and legs. Numbness in the legs.  EXAM: MRI LUMBAR SPINE WITHOUT CONTRAST  TECHNIQUE: Multiplanar, multisequence MR imaging of the lumbar spine was performed. No intravenous contrast was administered.  COMPARISON:  None.  FINDINGS: Segmentation: I do not have plain films for correlation. L5-S1 interspace appears hypoplastic. There may be transitional anatomy.  Alignment: Straightening of the normal lumbar lordosis due to prior fusion L3-S1.  Vertebrae: Chronic T12 compression fracture. The patient has undergone some type of posterior fixation from L3 through S1. There is extensive metallic artifact. I do not have plain film correlates. No pedicle screws are placed. There appears to be solid interbody arthrodesis.  Conus medullaris and cauda equina: Conus extends to the L1 level. Conus and cauda equina appear normal.  Paraspinal and other soft tissues: Renal cystic disease, incompletely evaluated. Abdominal aortic aneurysm, 43 by 43 mm cross-section, with some mural thrombus.  Disc levels:  L1-L2: Disc desiccation and loss of disc height. Central protrusion. Posterior element hypertrophy. Moderate to severe stenosis. BILATERAL subarticular zone narrowing results in L2 nerve root impingement. BILATERAL foraminal narrowing, not clearly compressive.  L2-L3: Disc desiccation, slight loss of disc height. Central disc extrusion. Posterior element hypertrophy. Severe stenosis, probable near complete block with crowding of the cauda equina nerve roots centrally. Severe  BILATERAL L3 nerve root impingement. BILATERAL foraminal narrowing likely affects both L2 nerve roots.  L3-L4:  Post fusion interspace, grossly unremarkable.  L4-L5:  Post fusion interspace, grossly unremarkable.  L5-S1:  Post fusion interspace, grossly unremarkable.  IMPRESSION: Solid appearing L3-S1 arthrodesis. Some type of metallic posterior fixation has been performed.  Severe stenosis at L2-3 related to posterior element hypertrophy and central disc extrusion. BILATERAL L3 and L2 nerve root impingement are likely.  Moderate to severe stenosis L1-2, central protrusion with posterior element hypertrophy, with BILATERAL L2 nerve root impingement.  4.3 cm abdominal aortic aneurysm. Some mural thrombus is present. Recommend followup by [place] in 1 year. This recommendation follows ACR consensus guidelines: White Paper of the ACR Incidental Findings Committee II on Vascular Findings. J Am Coll Radiol 2013; 10:789-794.   Electronically Signed   By: Staci Righter M.D.   On: 03/05/2018 20:16    Complexity Note: Imaging results reviewed. Results shared with Logan Vazquez, using Layman's terms.                         Meds   Current Outpatient Medications:  .  albuterol (PROVENTIL HFA;VENTOLIN HFA) 108 (90 Base) MCG/ACT inhaler, Inhale into the lungs., Disp: , Rfl:  .  aspirin EC 81 MG tablet, Take  81 mg by mouth., Disp: , Rfl:  .  atorvastatin (LIPITOR) 40 MG tablet, Take 1 tablet (40 mg total) daily by mouth., Disp: 30 tablet, Rfl: 3 .  Cholecalciferol (VITAMIN D-1000 MAX ST) 1000 units tablet, Take by mouth., Disp: , Rfl:  .  Cyanocobalamin (B-12) 2000 MCG TABS, Take by mouth., Disp: , Rfl:  .  DULoxetine (CYMBALTA) 60 MG capsule, Take 1 capsule (60 mg total) daily by mouth., Disp: 30 capsule, Rfl: 3 .  furosemide (LASIX) 20 MG tablet, Take by mouth., Disp: , Rfl:  .  lisinopril (PRINIVIL,ZESTRIL) 40 MG tablet, Take 1 tablet (40 mg total) by mouth daily., Disp: 30 tablet, Rfl:  5 .  nitroGLYCERIN (NITROSTAT) 0.4 MG SL tablet, Place under the tongue., Disp: , Rfl:  .  omeprazole (PRILOSEC) 20 MG capsule, , Disp: , Rfl:  .  pregabalin (LYRICA) 75 MG capsule, Take 1 capsule (75 mg total) by mouth 2 (two) times daily., Disp: 60 capsule, Rfl: 1 .  HYDROcodone-acetaminophen (NORCO) 10-325 MG tablet, Take 1 tablet by mouth 3 (three) times daily as needed for severe pain., Disp: 90 tablet, Rfl: 0 .  nicotine (NICODERM CQ - DOSED IN MG/24 HOURS) 14 mg/24hr patch, Place 14 mg onto the skin daily., Disp: , Rfl:   ROS  Constitutional: Denies any fever or chills Gastrointestinal: No reported hemesis, hematochezia, vomiting, or acute GI distress Musculoskeletal: Denies any acute onset joint swelling, redness, loss of ROM, or weakness Neurological: No reported episodes of acute onset apraxia, aphasia, dysarthria, agnosia, amnesia, paralysis, loss of coordination, or loss of consciousness  Allergies  Logan Vazquez is allergic to ibuprofen.  [place]  Drug: Logan Vazquez  reports that he does not use drugs. Alcohol:  reports that he does not drink alcohol. Tobacco:  reports that he has been smoking cigarettes.  He has a 14.00 pack-year smoking history. He has never used smokeless tobacco. Medical:  has a past medical history of Arthritis, Asthma, CAD (coronary artery disease), Depression, Emphysema lung ([place]), Glaucoma, and Hypertension. Surgical: Logan Vazquez  has a past surgical history that includes heart surgery (07/1999); Hip surgery (Left, 1968); Back surgery (1985); Skin graft (1968); and Humerus fracture surgery (1968). Family: family history includes Cancer in his father; Hearing loss in his mother; Heart attack in his brother; Heart disease in his brother; Hypertension in his father and mother; Stroke in his sister.  Constitutional Exam  General appearance: Well nourished, well developed, and well hydrated. In no apparent acute distress Vitals:   03/18/18 1250  BP: (!) 161/90   Pulse: 84  Resp: 18  Temp: 97.9 F (36.6 C)  TempSrc: Oral  SpO2: 96%  Weight: 150 lb (68 kg)  Height: _0  (1.753 m)   BMI Assessment: Estimated body mass index is 22.15 kg/m as calculated from the following:   Height as of this encounter: _1  (1.753 m).   Weight as of this encounter: 150 lb (68 kg).  BMI interpretation table: BMI level Category Range association with higher incidence of chronic pain  <18 kg/m2 Underweight   18.5-24.9 kg/m2 Ideal body weight   25-29.9 kg/m2 Overweight Increased incidence by 20%  30-34.9 kg/m2 Obese (Class I) Increased incidence by 68%  35-39.9 kg/m2 Severe obesity (Class II) Increased incidence by 136%  >40 kg/m2 Extreme obesity (Class III) Increased incidence by 254%   BMI Readings from Last 4 Encounters:  03/18/18 22.15 kg/m  03/10/18 22.15 kg/m  02/03/18 22.45 kg/m  10/23/17 22.00 kg/m   Wt Readings  from Last 4 Encounters:  03/18/18 150 lb (68 kg)  03/10/18 150 lb (68 kg)  02/03/18 152 lb (68.9 kg)  10/23/17 149 lb (67.6 kg)  Psych/Mental status: Alert, oriented x 3 (person, place, & time)       Eyes: PERLA Respiratory: No evidence of acute respiratory distress  Cervical Spine Area Exam  Skin & Axial Inspection: No masses, redness, edema, swelling, or associated skin lesions Alignment: Symmetrical Functional ROM: Unrestricted ROM      Stability: No instability detected Muscle Tone/Strength: Functionally intact. No obvious neuro-muscular anomalies detected. Sensory (Neurological): Unimpaired Palpation: No palpable anomalies              Upper Extremity (UE) Exam    Side: Right upper extremity  Side: Left upper extremity  Skin & Extremity Inspection: Skin color, temperature, and hair growth are WNL. No peripheral edema or cyanosis. No masses, redness, swelling, asymmetry, or associated skin lesions. No contractures.  Skin & Extremity Inspection: Skin color, temperature, and hair growth are WNL. No peripheral edema or  cyanosis. No masses, redness, swelling, asymmetry, or associated skin lesions. No contractures.  Functional ROM: Unrestricted ROM          Functional ROM: Unrestricted ROM          Muscle Tone/Strength: Functionally intact. No obvious neuro-muscular anomalies detected.  Muscle Tone/Strength: Functionally intact. No obvious neuro-muscular anomalies detected.  Sensory (Neurological): Unimpaired          Sensory (Neurological): Unimpaired          Palpation: No palpable anomalies              Palpation: No palpable anomalies              Specialized Test(s): Deferred         Specialized Test(s): Deferred          Thoracic Spine Area Exam  Skin & Axial Inspection: No masses, redness, or swelling Alignment: Symmetrical Functional ROM: Unrestricted ROM Stability: No instability detected Muscle Tone/Strength: Functionally intact. No obvious neuro-muscular anomalies detected. Sensory (Neurological): Unimpaired Muscle strength & Tone: No palpable anomalies  Lumbar Spine Area Exam  Skin & Axial Inspection: Well healed scar from previous spine surgery detected Alignment: Asymmetric Functional ROM: Decreased ROM      Stability: No instability detected Muscle Tone/Strength: Functionally intact. No obvious neuro-muscular anomalies detected. Sensory (Neurological): Dermatomal pain pattern Palpation: Complains of area being tender to palpation       Provocative Tests: Lumbar Hyperextension and rotation test: Positive bilaterally for facet joint pain. Lumbar Lateral bending test: Positive ipsilateral radicular pain, bilaterally. Positive for bilateral foraminal stenosis. Patrick's Maneuver: evaluation deferred today                    Gait & Posture Assessment  Ambulation: Patient ambulates using a walker Gait: Antalgic Posture: Difficulty standing up straight, due to pain   Lower Extremity Exam    Side: Right lower extremity  Side: Left lower extremity  Skin & Extremity Inspection: Skin color,  temperature, and hair growth are WNL. No peripheral edema or cyanosis. No masses, redness, swelling, asymmetry, or associated skin lesions. No contractures.  Skin & Extremity Inspection: Skin color, temperature, and hair growth are WNL. No peripheral edema or cyanosis. No masses, redness, swelling, asymmetry, or associated skin lesions. No contractures.  Functional ROM: Unrestricted ROM          Functional ROM: Unrestricted ROM          Muscle  Tone/Strength: Functionally intact. No obvious neuro-muscular anomalies detected.  Muscle Tone/Strength: Functionally intact. No obvious neuro-muscular anomalies detected.  Sensory (Neurological): Unimpaired  Sensory (Neurological): Unimpaired  Palpation: No palpable anomalies  Palpation: No palpable anomalies   Assessment & Plan  Primary Diagnosis & Pertinent Problem List: The primary encounter diagnosis was Chronic pain syndrome. Diagnoses of Lumbar degenerative disc disease, History of CVA (cerebrovascular accident), Right sided weakness, Acquired right foot drop, Chronic bilateral low back pain with bilateral sciatica, History of lumbar spinal fusion, and Controlled substance agreement signed were also pertinent to this visit.  Visit Diagnosis: 1. Chronic pain syndrome   2. Lumbar degenerative disc disease   3. History of CVA (cerebrovascular accident)   4. Right sided weakness   5. Acquired right foot drop   6. Chronic bilateral low back pain with bilateral sciatica   7. History of lumbar spinal fusion   8. Controlled substance agreement signed    General Recommendations: The pain condition that the patient suffers from is best treated with a multidisciplinary approach that involves an increase in physical activity to prevent de-conditioning and worsening of the pain cycle, as well as psychological counseling (formal and/or informal) to address the co-morbid psychological affects of pain. Treatment will often involve judicious use of pain medications  and interventional procedures to decrease the pain, allowing the patient to participate in the physical activity that will ultimately produce long-lasting pain reductions. The goal of the multidisciplinary approach is to return the patient to a higher level of overall function and to restore their ability to perform activities of daily living.  72 year old male with a history of lumbar spine surgery greater than 20 years ago(L4/L5 laminectomy, L4 through S1 posterior fusion, with unremarkable hardware and incorporated bone grafting),CVA in 2018 that resulted in right-sided weakness who presents with axial low back pain that radiates into his right posterior thigh and also occasional right ankle pain. Pain has been chronic in nature. Patient was previously on very high-dose opioid therapy which included OxyContin 80 mg 4 times daily, oxycodone 10 mg up to 6 times a day. I notified the patient that his morphine milliequivalents will not exceed 60 at our clinic.  Patient's most recent lumbar MRI shows L3-S1 arthrodesis along with associated severe stenosis at L2/3 and related to posterior element hypertrophy and central disc extrusion as well as bilateral L3 and L2 nerve root impingement.  We discussed performing L2/3 epidural steroid injection but the patient would like to wait.  Patient returns today for follow-up.  His urine drug screen was appropriate.  Patient is finding benefit with Lyrica 75 mg twice daily and Cymbalta 60 mg daily which are prescribed by his primary care physician.  Today we will start hydrocodone 10 mg 3 times daily as needed for moderate to severe pain.  Patient will also complete opioid contract.  Plan: -Sign opiate contract today -Prescription for hydrocodone 10 mg 3 times daily as needed, quantity 15-month-Continue Lyrica 75 mill grams twice daily, Cymbalta 60 mill grams daily -Recommended Tylenol 500 mg 3 times daily to 4 times daily as needed moderate pain   Plan of  Care  Pharmacotherapy (Medications Ordered): Meds ordered this encounter  Medications  . HYDROcodone-acetaminophen (NORCO) 10-325 MG tablet    Sig: Take 1 tablet by mouth 3 (three) times daily as needed for severe pain.    Dispense:  90 tablet    Refill:  0    Do not place this medication, or any other prescription from our  practice, on "Automatic Refill". Patient may have prescription filled one day early if pharmacy is closed on scheduled refill date.  For chronic pain. To last for 30 days from fill date    Provider-requested follow-up: Return in about 1 month (around 04/16/2018) for Medication Management. Time Note: Greater than 50% of the 25 minute(s) of face-to-face time spent with Logan Vazquez, was spent in counseling/coordination of care regarding: Logan Vazquez primary cause of pain, the treatment plan, treatment alternatives, the risks and possible complications of proposed treatment, medication side effects, the opioid analgesic risks and possible complications, the appropriate use of his medications, the goals of pain management (increased in functionality), the medication agreement and the patient's responsibilities when it comes to controlled substances. Future Appointments  Date Time Provider [place]  04/16/2018  1:30 PM Gillis Santa, MD [facility] None  04/09/2019 10:30 AM [facility] None  04/09/2019 11:30 AM Dew, Erskine Squibb, MD [facility] None    Primary Care Physician: Virginia Crews, MD Location: [facility] Note by: Gillis Santa, M.D Date: 03/18/2018; Time: 1:52 PM  Patient Instructions  1. Sign opioid contract 2. Rx for Hydrocodone for 1 month 4. Continue Lyrica and Cymbalta as prescribed by PCP  Prescription given for hydrocodone.

## 2018-04-10 ENCOUNTER — Telehealth: Payer: Self-pay | Admitting: Family Medicine

## 2018-04-10 NOTE — Telephone Encounter (Signed)
Aldrin advised. 

## 2018-04-10 NOTE — Telephone Encounter (Signed)
Aldrin with [facility] called for verbal orders  To continue home health PT 2 times a weeks 3 weeks.  Please advise  Thank sTeri

## 2018-04-10 NOTE — Telephone Encounter (Signed)
Ok to order 

## 2018-04-10 NOTE — Telephone Encounter (Signed)
Agree.  Erasmo Downer, MD, MPH [facility] 04/10/2018 11:19 AM

## 2018-04-16 ENCOUNTER — Ambulatory Visit
Payer: Medicare HMO | Attending: Student in an Organized Health Care Education/Training Program | Admitting: Student in an Organized Health Care Education/Training Program

## 2018-04-16 ENCOUNTER — Encounter: Payer: Self-pay | Admitting: Student in an Organized Health Care Education/Training Program

## 2018-04-16 ENCOUNTER — Other Ambulatory Visit: Payer: Self-pay

## 2018-04-16 VITALS — BP 148/89 | HR 93 | Temp 97.9°F | Resp 16 | Ht 69.0 in | Wt 145.0 lb

## 2018-04-16 DIAGNOSIS — M5126 Other intervertebral disc displacement, lumbar region: Secondary | ICD-10-CM | POA: Insufficient documentation

## 2018-04-16 DIAGNOSIS — M5441 Lumbago with sciatica, right side: Secondary | ICD-10-CM | POA: Diagnosis not present

## 2018-04-16 DIAGNOSIS — M48061 Spinal stenosis, lumbar region without neurogenic claudication: Secondary | ICD-10-CM | POA: Diagnosis not present

## 2018-04-16 DIAGNOSIS — I10 Essential (primary) hypertension: Secondary | ICD-10-CM | POA: Diagnosis not present

## 2018-04-16 DIAGNOSIS — Z79899 Other long term (current) drug therapy: Secondary | ICD-10-CM | POA: Insufficient documentation

## 2018-04-16 DIAGNOSIS — Z7982 Long term (current) use of aspirin: Secondary | ICD-10-CM | POA: Insufficient documentation

## 2018-04-16 DIAGNOSIS — M5417 Radiculopathy, lumbosacral region: Secondary | ICD-10-CM | POA: Diagnosis not present

## 2018-04-16 DIAGNOSIS — Z79891 Long term (current) use of opiate analgesic: Secondary | ICD-10-CM | POA: Insufficient documentation

## 2018-04-16 DIAGNOSIS — G894 Chronic pain syndrome: Secondary | ICD-10-CM | POA: Diagnosis not present

## 2018-04-16 DIAGNOSIS — I25119 Atherosclerotic heart disease of native coronary artery with unspecified angina pectoris: Secondary | ICD-10-CM | POA: Insufficient documentation

## 2018-04-16 DIAGNOSIS — Z981 Arthrodesis status: Secondary | ICD-10-CM | POA: Insufficient documentation

## 2018-04-16 DIAGNOSIS — G8929 Other chronic pain: Secondary | ICD-10-CM | POA: Diagnosis not present

## 2018-04-16 DIAGNOSIS — Z5181 Encounter for therapeutic drug level monitoring: Secondary | ICD-10-CM | POA: Insufficient documentation

## 2018-04-16 DIAGNOSIS — R531 Weakness: Secondary | ICD-10-CM | POA: Diagnosis not present

## 2018-04-16 DIAGNOSIS — J439 Emphysema, unspecified: Secondary | ICD-10-CM | POA: Insufficient documentation

## 2018-04-16 DIAGNOSIS — M5442 Lumbago with sciatica, left side: Secondary | ICD-10-CM | POA: Diagnosis not present

## 2018-04-16 DIAGNOSIS — F17210 Nicotine dependence, cigarettes, uncomplicated: Secondary | ICD-10-CM | POA: Diagnosis not present

## 2018-04-16 DIAGNOSIS — F419 Anxiety disorder, unspecified: Secondary | ICD-10-CM | POA: Diagnosis not present

## 2018-04-16 DIAGNOSIS — Z8673 Personal history of transient ischemic attack (TIA), and cerebral infarction without residual deficits: Secondary | ICD-10-CM

## 2018-04-16 DIAGNOSIS — M5136 Other intervertebral disc degeneration, lumbar region: Secondary | ICD-10-CM | POA: Diagnosis not present

## 2018-04-16 DIAGNOSIS — Z886 Allergy status to analgesic agent status: Secondary | ICD-10-CM | POA: Diagnosis not present

## 2018-04-16 DIAGNOSIS — F329 Major depressive disorder, single episode, unspecified: Secondary | ICD-10-CM | POA: Insufficient documentation

## 2018-04-16 DIAGNOSIS — I714 Abdominal aortic aneurysm, without rupture: Secondary | ICD-10-CM | POA: Diagnosis not present

## 2018-04-16 DIAGNOSIS — M21371 Foot drop, right foot: Secondary | ICD-10-CM | POA: Diagnosis not present

## 2018-04-16 DIAGNOSIS — M25571 Pain in right ankle and joints of right foot: Secondary | ICD-10-CM | POA: Insufficient documentation

## 2018-04-16 MED ORDER — OXYCODONE-ACETAMINOPHEN 10-325 MG PO TABS: 1.0000 | ORAL_TABLET | Freq: Three times a day (TID) | ORAL | 0 refills | Status: DC | PRN

## 2018-04-16 NOTE — Progress Notes (Signed)
Nursing Pain Medication Assessment:  Safety precautions to be maintained throughout the outpatient stay will include: orient to surroundings, keep bed in low position, maintain call bell within reach at all times, provide assistance with transfer out of bed and ambulation.  Medication Inspection Compliance: Pill count conducted under aseptic conditions, in front of the patient. Neither the pills nor the bottle was removed from the patient's sight at any time. Once count was completed pills were immediately returned to the patient in their original bottle.  Medication: Hydrocodone/APAP Pill/Patch Count: 3 of 90 pills remain Pill/Patch Appearance: Markings consistent with prescribed medication Bottle Appearance: Standard pharmacy container. Clearly labeled. Filled Date: 4 / 3 / 2019 Last Medication intake:  Today

## 2018-04-16 NOTE — Progress Notes (Signed)
Patient's Name: Logan Vazquez  MRN: [id number]  Referring Provider: Virginia Crews, MD  DOB: Feb 04, 1946  PCP: Virginia Crews, MD  DOS: 04/16/2018  Note by: Gillis Santa, MD  Service setting: Ambulatory outpatient  Specialty: Interventional Pain Management  Location: [facility] (AMB) Pain Management Facility    Patient type: Established   Primary Reason(s) for Visit: Encounter for prescription drug management. (Level of risk: moderate)  CC: Back Pain (lower)  HPI  Logan Vazquez is a 72 y.o. year old, male patient, who comes today for a medication management evaluation. He has Hypertension; Tobacco dependence; AAA (abdominal aortic aneurysm) without rupture ([place]); Chronic back pain; Depression, recurrent (HCC); COPD (chronic obstructive pulmonary disease) ([place]); Anxiety; Coronary artery disease involving native coronary artery of native heart with angina pectoris ([place]); History of CVA (cerebrovascular accident); Acquired right foot drop; History of lumbar spinal fusion; and Lumbar degenerative disc disease on their problem list. His primarily concern today is the Back Pain (lower)  Pain Assessment: Location: Lower Back Radiating: down the backs of both legs to ankles Onset: More than a month ago Duration: Chronic pain Quality: Aching, Constant Severity: 8 /10 (self-reported pain score)  Note: Reported level is inconsistent with clinical observations. Clinically the patient looks like a 4/10 A 4/10 is viewed as "Moderately Severe" and described as impossible to ignore for more than a few minutes. With effort, patients may still be able to manage work or participate in some social activities. Very difficult to concentrate. Signs of autonomic nervous system discharge are evident: dilated pupils (mydriasis); mild sweating (diaphoresis); sleep interference. Heart rate becomes elevated (>115 bpm). Diastolic blood pressure (lower number) rises above 100 mmHg. Patients find relief in laying down and not  moving. Information on the proper use of the pain scale provided to the patient today. When using our objective Pain Scale, levels between 6 and 10/10 are said to belong in an emergency room, as it progressively worsens from a 6/10, described as severely limiting, requiring emergency care not usually available at an outpatient pain management facility. At a 6/10 level, communication becomes difficult and requires great effort. Assistance to reach the emergency department may be required. Facial flushing and profuse sweating along with potentially dangerous increases in heart rate and blood pressure will be evident. Effect on ADL: not able to participate much with the exercises home aid tries to do with pt twice a week  Timing: Constant Modifying factors: nothing helps  [name] was last scheduled for an appointment on 03/18/2018 for medication management. During today's appointment we reviewed Mr. Gerding chronic pain status, as well as his outpatient medication regimen.  Patient returns today for medication follow-up.  He still continuing to endorse severe low back pain that radiates down into bilateral legs and ankles in a dermatomal fashion secondary to chronic lumbosacral radiculopathy in the context of previous lumbar spine fusion.  Patient also has chronic memory recall issues given history of CVA.  He does have right-sided weakness.  Patient does not find the hydrocodone effective.  We will trial Percocet instead.  The patient  reports that he does not use drugs. His body mass index is 21.41 kg/m.  Further details on both, my assessment(s), as well as the proposed treatment plan, please see below.  Controlled Substance Pharmacotherapy Assessment REMS (Risk Evaluation and Mitigation Strategy)  Analgesic: hydrocodone 10 mg 3 times daily as needed, quantity 80-monthMME/day: 30 mg/day.  WRise Patience RN  04/16/2018  1:31 PM  Sign at close encounter  Nursing Pain Medication Assessment:   Safety precautions to be maintained throughout the outpatient stay will include: orient to surroundings, keep bed in low position, maintain call bell within reach at all times, provide assistance with transfer out of bed and ambulation.  Medication Inspection Compliance: Pill count conducted under aseptic conditions, in front of the patient. Neither the pills nor the bottle was removed from the patient's sight at any time. Once count was completed pills were immediately returned to the patient in their original bottle.  Medication: Hydrocodone/APAP Pill/Patch Count: 3 of 90 pills remain Pill/Patch Appearance: Markings consistent with prescribed medication Bottle Appearance: Standard pharmacy container. Clearly labeled. Filled Date: 4 / 3 / 2019 Last Medication intake:  Today   Pharmacokinetics: Liberation and absorption (onset of action): WNL Distribution (time to peak effect): WNL Metabolism and excretion (duration of action): WNL         Pharmacodynamics: Desired effects: Analgesia: Mr. Benegas reports <50% benefit. Functional ability: Patient reports that medication does help, but not nearly as much as he would like Clinically meaningful improvement in function (CMIF): Sustained CMIF goals met Perceived effectiveness: Described as ineffective and would like to make some changes Undesirable effects: Side-effects or Adverse reactions: None reported Monitoring: [place] PMP: Online review of the past 50-monthperiod conducted. Compliant with practice rules and regulations Last UDS on record: Summary  Date Value Ref Range Status  03/10/2018 FINAL  Final    Comment:    ==================================================================== TOXASSURE COMP DRUG ANALYSIS,UR ==================================================================== Test                             Result       Flag       Units Drug Present and Declared for Prescription Verification   Pregabalin                      PRESENT      EXPECTED   Duloxetine                     PRESENT      EXPECTED Drug Present not Declared for Prescription Verification   Mirtazapine                    PRESENT      UNEXPECTED Drug Absent but Declared for Prescription Verification   Salicylate                     Not Detected UNEXPECTED    Aspirin, as indicated in the declared medication list, is not    always detected even when used as directed. ==================================================================== Test                      Result    Flag   Units      Ref Range   Creatinine              159              mg/dL      >=20 ==================================================================== Declared Medications:  The flagging and interpretation on this report are based on the  following declared medications.  Unexpected results may arise from  inaccuracies in the declared medications.  **Note: The testing scope of this panel includes these medications:  Duloxetine  Pregabalin  **Note: The testing scope of this panel does not include small to  moderate amounts of these reported  medications:  Aspirin (Aspirin 81)  **Note: The testing scope of this panel does not include following  reported medications:  Albuterol  Atorvastatin  Cholecalciferol  Furosemide  Lisinopril  Nicotine  Nitroglycerin (NTG)  Omeprazole  Vitamin B12 ==================================================================== For clinical consultation, please call [phone]. ====================================================================    UDS interpretation: Compliant          Medication Assessment Form: Reviewed. Patient indicates being compliant with therapy Treatment compliance: Compliant Risk Assessment Profile: Aberrant behavior: See prior evaluations. None observed or detected today Comorbid factors increasing risk of overdose: See prior notes. No additional risks detected today Risk of substance use disorder (SUD):  Low Opioid Risk Tool - 04/16/18 1341      Family History of Substance Abuse   Alcohol  Negative    Illegal Drugs  Negative    Rx Drugs  Negative      Personal History of Substance Abuse   Alcohol  Negative    Illegal Drugs  Negative    Rx Drugs  Negative      Age   Age between 25-45 years   No      History of Preadolescent Sexual Abuse   History of Preadolescent Sexual Abuse  Negative or Male      Psychological Disease   Psychological Disease  Negative    Depression  Positive      Total Score   Opioid Risk Tool Scoring  1    Opioid Risk Interpretation  Low Risk      ORT Scoring interpretation table:  Score <3 = Low Risk for SUD  Score between 4-7 = Moderate Risk for SUD  Score >8 = High Risk for Opioid Abuse   Risk Mitigation Strategies:  Patient Counseling: Covered Patient-Prescriber Agreement (PPA): Present and active  Notification to other healthcare providers: Done  Pharmacologic Plan: Discontinue hydrocodone.  Start Percocet 10 mg 3 times daily as needed, quantity 90/month.             Laboratory Chemistry  Inflammation Markers (CRP: Acute Phase) (ESR: Chronic Phase) No results found for: CRP, ESRSEDRATE, LATICACIDVEN                       Rheumatology Markers No results found for: RF, ANA, [place] B[place], LYMEIGGIGMAB, [facility]                      Renal Function Markers Lab Results  Component Value Date   BUN 15 10/23/2017   CREATININE 0.92 10/23/2017                              Hepatic Function Markers Lab Results  Component Value Date   AST 14 10/23/2017   ALT 10 10/23/2017                        Electrolytes Lab Results  Component Value Date   NA 138 10/23/2017   K 4.9 10/23/2017   CL 102 10/23/2017   CALCIUM 9.5 10/23/2017                        Neuropathy Markers No results found for: VITAMINB12, FOLATE, HGBA1C, HIV                      Bone Pathology Markers No results found for: [place], [id number], [id number], [id number],  [place], [place], 25OHVITD3,  TESTOFREE, TESTOSTERONE                       Coagulation Parameters Lab Results  Component Value Date   PLT 349 10/23/2017                        Cardiovascular Markers Lab Results  Component Value Date   HGB 14.7 10/23/2017   HCT 43.1 10/23/2017                         CA Markers No results found for: CEA, CA125, LABCA2                      Note: Lab results reviewed.  Recent Diagnostic Imaging Results  MR LUMBAR SPINE WO CONTRAST CLINICAL DATA:  Previous fusion in 1985. Now with pain in the hips and legs. Numbness in the legs.  EXAM: MRI LUMBAR SPINE WITHOUT CONTRAST  TECHNIQUE: Multiplanar, multisequence MR imaging of the lumbar spine was performed. No intravenous contrast was administered.  COMPARISON:  None.  FINDINGS: Segmentation: I do not have plain films for correlation. L5-S1 interspace appears hypoplastic. There may be transitional anatomy.  Alignment: Straightening of the normal lumbar lordosis due to prior fusion L3-S1.  Vertebrae: Chronic T12 compression fracture. The patient has undergone some type of posterior fixation from L3 through S1. There is extensive metallic artifact. I do not have plain film correlates. No pedicle screws are placed. There appears to be solid interbody arthrodesis.  Conus medullaris and cauda equina: Conus extends to the L1 level. Conus and cauda equina appear normal.  Paraspinal and other soft tissues: Renal cystic disease, incompletely evaluated. Abdominal aortic aneurysm, 43 by 43 mm cross-section, with some mural thrombus.  Disc levels:  L1-L2: Disc desiccation and loss of disc height. Central protrusion. Posterior element hypertrophy. Moderate to severe stenosis. BILATERAL subarticular zone narrowing results in L2 nerve root impingement. BILATERAL foraminal narrowing, not clearly compressive.  L2-L3: Disc desiccation, slight loss of disc height. Central disc extrusion.  Posterior element hypertrophy. Severe stenosis, probable near complete block with crowding of the cauda equina nerve roots centrally. Severe BILATERAL L3 nerve root impingement. BILATERAL foraminal narrowing likely affects both L2 nerve roots.  L3-L4:  Post fusion interspace, grossly unremarkable.  L4-L5:  Post fusion interspace, grossly unremarkable.  L5-S1:  Post fusion interspace, grossly unremarkable.  IMPRESSION: Solid appearing L3-S1 arthrodesis. Some type of metallic posterior fixation has been performed.  Severe stenosis at L2-3 related to posterior element hypertrophy and central disc extrusion. BILATERAL L3 and L2 nerve root impingement are likely.  Moderate to severe stenosis L1-2, central protrusion with posterior element hypertrophy, with BILATERAL L2 nerve root impingement.  4.3 cm abdominal aortic aneurysm. Some mural thrombus is present. Recommend followup by [place] in 1 year. This recommendation follows ACR consensus guidelines: White Paper of the ACR Incidental Findings Committee II on Vascular Findings. J Am Coll Radiol 2013; 10:789-794.  Electronically Signed   By: Staci Righter M.D.   On: 03/05/2018 20:16  Complexity Note: Imaging results reviewed. Results shared with Mr. Emma, using Layman's terms.                         Meds   Current Outpatient Medications:  .  albuterol (PROVENTIL HFA;VENTOLIN HFA) 108 (90 Base) MCG/ACT inhaler, Inhale into the lungs., Disp: , Rfl:  .  aspirin EC 81  MG tablet, Take 81 mg by mouth., Disp: , Rfl:  .  atorvastatin (LIPITOR) 40 MG tablet, Take 1 tablet (40 mg total) daily by mouth., Disp: 30 tablet, Rfl: 3 .  Cholecalciferol (VITAMIN D-1000 MAX ST) 1000 units tablet, Take by mouth., Disp: , Rfl:  .  Cyanocobalamin (B-12) 2000 MCG TABS, Take by mouth., Disp: , Rfl:  .  furosemide (LASIX) 20 MG tablet, Take by mouth., Disp: , Rfl:  .  lisinopril (PRINIVIL,ZESTRIL) 40 MG tablet, Take 1 tablet (40 mg total) by mouth daily.,  Disp: 30 tablet, Rfl: 5 .  nitroGLYCERIN (NITROSTAT) 0.4 MG SL tablet, Place under the tongue., Disp: , Rfl:  .  omeprazole (PRILOSEC) 20 MG capsule, , Disp: , Rfl:  .  pregabalin (LYRICA) 75 MG capsule, Take 1 capsule (75 mg total) by mouth 2 (two) times daily., Disp: 60 capsule, Rfl: 1 .  nicotine (NICODERM CQ - DOSED IN MG/24 HOURS) 14 mg/24hr patch, Place 14 mg onto the skin daily., Disp: , Rfl:  .  [START ON 04/17/2018] oxyCODONE-acetaminophen (PERCOCET) 10-325 MG tablet, Take 1 tablet by mouth every 8 (eight) hours as needed for pain. For chronic pain To last for 30 days from fill date, Disp: 90 tablet, Rfl: 0  ROS  Constitutional: Denies any fever or chills Gastrointestinal: No reported hemesis, hematochezia, vomiting, or acute GI distress Musculoskeletal: Denies any acute onset joint swelling, redness, loss of ROM, or weakness Neurological: No reported episodes of acute onset apraxia, aphasia, dysarthria, agnosia, amnesia, paralysis, loss of coordination, or loss of consciousness  Allergies  Mr. Jewkes is allergic to ibuprofen.  [place]  Drug: Mr. Aro  reports that he does not use drugs. Alcohol:  reports that he does not drink alcohol. Tobacco:  reports that he has been smoking cigarettes.  He has a 14.00 pack-year smoking history. He has never used smokeless tobacco. Medical:  has a past medical history of Arthritis, Asthma, CAD (coronary artery disease), Depression, Emphysema lung ([place]), Glaucoma, and Hypertension. Surgical: Mr. Dec  has a past surgical history that includes heart surgery (07/1999); Hip surgery (Left, 1968); Back surgery (1985); Skin graft (1968); and Humerus fracture surgery (1968). Family: family history includes Cancer in his father; Hearing loss in his mother; Heart attack in his brother; Heart disease in his brother; Hypertension in his father and mother; Stroke in his sister.  Constitutional Exam  General appearance: Well nourished, well developed, and well  hydrated. In no apparent acute distress Vitals:   04/16/18 1333  BP: (!) 148/89  Pulse: 93  Resp: 16  Temp: 97.9 F (36.6 C)  TempSrc: Oral  SpO2: 97%  Weight: 145 lb (65.8 kg)  Height: _0  (1.753 m)   BMI Assessment: Estimated body mass index is 21.41 kg/m as calculated from the following:   Height as of this encounter: _1  (1.753 m).   Weight as of this encounter: 145 lb (65.8 kg).  BMI interpretation table: BMI level Category Range association with higher incidence of chronic pain  <18 kg/m2 Underweight   18.5-24.9 kg/m2 Ideal body weight   25-29.9 kg/m2 Overweight Increased incidence by 20%  30-34.9 kg/m2 Obese (Class I) Increased incidence by 68%  35-39.9 kg/m2 Severe obesity (Class II) Increased incidence by 136%  >40 kg/m2 Extreme obesity (Class III) Increased incidence by 254%   Patient's current BMI Ideal Body weight  Body mass index is 21.41 kg/m. Ideal body weight: 70.7 kg (155 lb 13.8 oz)   BMI Readings from Last 4 Encounters:  04/16/18 21.41 kg/m  03/18/18 22.15 kg/m  03/10/18 22.15 kg/m  02/03/18 22.45 kg/m   Wt Readings from Last 4 Encounters:  04/16/18 145 lb (65.8 kg)  03/18/18 150 lb (68 kg)  03/10/18 150 lb (68 kg)  02/03/18 152 lb (68.9 kg)  Psych/Mental status: Alert, oriented x 3 (person, place, & time)       Eyes: PERLA Respiratory: No evidence of acute respiratory distress  Cervical Spine Area Exam  Skin & Axial Inspection: No masses, redness, edema, swelling, or associated skin lesions Alignment: Symmetrical Functional ROM: Unrestricted ROM      Stability: No instability detected Muscle Tone/Strength: Functionally intact. No obvious neuro-muscular anomalies detected. Sensory (Neurological): Unimpaired Palpation: No palpable anomalies              Upper Extremity (UE) Exam    Side: Right upper extremity  Side: Left upper extremity  Skin & Extremity Inspection: Skin color, temperature, and hair growth are WNL. No peripheral edema  or cyanosis. No masses, redness, swelling, asymmetry, or associated skin lesions. No contractures.  Skin & Extremity Inspection: Skin color, temperature, and hair growth are WNL. No peripheral edema or cyanosis. No masses, redness, swelling, asymmetry, or associated skin lesions. No contractures.  Functional ROM: Unrestricted ROM          Functional ROM: Unrestricted ROM          Muscle Tone/Strength: Functionally intact. No obvious neuro-muscular anomalies detected.  Muscle Tone/Strength: Functionally intact. No obvious neuro-muscular anomalies detected.  Sensory (Neurological): Unimpaired          Sensory (Neurological): Unimpaired          Palpation: No palpable anomalies              Palpation: No palpable anomalies              Specialized Test(s): Deferred         Specialized Test(s): Deferred          Thoracic Spine Area Exam  Skin & Axial Inspection: No masses, redness, or swelling Alignment: Symmetrical Functional ROM: Unrestricted ROM Stability: No instability detected Muscle Tone/Strength: Functionally intact. No obvious neuro-muscular anomalies detected. Sensory (Neurological): Unimpaired Muscle strength & Tone: No palpable anomalies   Lumbar Spine Area Exam  Skin & Axial Inspection: Well healed scar from previous spine surgery detected Alignment: Asymmetric Functional ROM: Decreased ROM      Stability: No instability detected Muscle Tone/Strength: Functionally intact. No obvious neuro-muscular anomalies detected. Sensory (Neurological): Dermatomal pain pattern Palpation: Complains of area being tender to palpation       Provocative Tests: Lumbar Hyperextension and rotation test: Positive bilaterally for facet joint pain. Lumbar Lateral bending test: Positive ipsilateral radicular pain, bilaterally. Positive for bilateral foraminal stenosis. Patrick's Maneuver: evaluation deferred today                    Gait & Posture Assessment  Ambulation: Patient ambulates using a  walker Gait: Antalgic Posture: Difficulty standing up straight, due to pain   Lower Extremity Exam    Side: Right lower extremity  Side: Left lower extremity  Skin & Extremity Inspection: Skin color, temperature, and hair growth are WNL. No peripheral edema or cyanosis. No masses, redness, swelling, asymmetry, or associated skin lesions. No contractures.  Skin & Extremity Inspection: Skin color, temperature, and hair growth are WNL. No peripheral edema or cyanosis. No masses, redness, swelling, asymmetry, or associated skin lesions. No contractures.  Functional ROM: Unrestricted ROM  Functional ROM: Unrestricted ROM          Muscle Tone/Strength: Functionally intact. No obvious neuro-muscular anomalies detected.  Muscle Tone/Strength: Functionally intact. No obvious neuro-muscular anomalies detected.  Sensory (Neurological): Unimpaired  Sensory (Neurological): Unimpaired  Palpation: No palpable anomalies  Palpation: No palpable anomalies     Assessment  Primary Diagnosis & Pertinent Problem List: The primary encounter diagnosis was Chronic pain syndrome. Diagnoses of Lumbar degenerative disc disease, History of CVA (cerebrovascular accident), Right sided weakness, Acquired right foot drop, Chronic bilateral low back pain with bilateral sciatica, and History of lumbar spinal fusion were also pertinent to this visit.  Status Diagnosis  Persistent Not responding Persistent 1. Chronic pain syndrome   2. Lumbar degenerative disc disease   3. History of CVA (cerebrovascular accident)   4. Right sided weakness   5. Acquired right foot drop   6. Chronic bilateral low back pain with bilateral sciatica   7. History of lumbar spinal fusion      General Recommendations: The pain condition that the patient suffers from is best treated with a multidisciplinary approach that involves an increase in physical activity to prevent de-conditioning and worsening of the pain cycle, as well  as psychological counseling (formal and/or informal) to address the co-morbid psychological affects of pain. Treatment will often involve judicious use of pain medications and interventional procedures to decrease the pain, allowing the patient to participate in the physical activity that will ultimately produce long-lasting pain reductions. The goal of the multidisciplinary approach is to return the patient to a higher level of overall function and to restore their ability to perform activities of daily living.  72 year old male with a history of lumbar spine surgery greater than 20 years ago(L4/L5 laminectomy, L4 through S1 posterior fusion, with unremarkable hardware and incorporated bone grafting),CVA in 2018 that resulted in right-sided weakness who presents with axial low back pain that radiates into his right posterior thigh and also occasional right ankle pain. Pain has been chronic in nature. Patient was previously on very high-dose opioid therapy which included OxyContin 80 mg 4 times daily, oxycodone 10 mg up to 6 times a day. I notified the patient that his morphine milliequivalents will not exceed 60 at our clinic.  Patient's most recent lumbar MRI shows L3-S1 arthrodesis along with associated severe stenosis at L2/3 and related to posterior element hypertrophy and central disc extrusion as well as bilateral L3 and L2 nerve root impingement.  We discussed performing L2/3 epidural steroid injection but the patient would like to wait.  Patient returns today for medication follow-up.  He still continuing to endorse severe low back pain that radiates down into bilateral legs and ankles in a dermatomal fashion secondary to chronic lumbosacral radiculopathy in the context of previous lumbar spine fusion.  Patient also has chronic memory recall issues given history of CVA.  He does have right-sided weakness.  Patient does not find the hydrocodone dose at 10 mg TID effective.  We will trial Percocet  10 mg TID instead. Instructed patient not to supplement with APAP while on Percocet. Continue Lyrica 75 mg BID.   Plan of Care  Pharmacotherapy (Medications Ordered): Meds ordered this encounter  Medications  . oxyCODONE-acetaminophen (PERCOCET) 10-325 MG tablet    Sig: Take 1 tablet by mouth every 8 (eight) hours as needed for pain. For chronic pain To last for 30 days from fill date    Dispense:  90 tablet    Refill:  0    Do  not place this medication, or any other prescription from our practice, on "Automatic Refill". Patient may have prescription filled one day early if pharmacy is closed on scheduled refill date.    Provider-requested follow-up: Return in about 1 month (around 05/14/2018) for Medication Management. Time Note: Greater than 50% of the 25 minute(s) of face-to-face time spent with Mr. Guertin, was spent in counseling/coordination of care regarding: Mr. Kurek primary cause of pain, the treatment plan, medication side effects, the opioid analgesic risks and possible complications, the appropriate use of his medications, realistic expectations, the goals of pain management (increased in functionality), the medication agreement and the patient's responsibilities when it comes to controlled substances.  Future Appointments  Date Time Provider Snyder  04/09/2019 10:30 AM [facility] 2 [facility] None  04/09/2019 11:30 AM Dew, Erskine Squibb, MD [facility] None    Primary Care Physician: Virginia Crews, MD Location: [facility] Note by: Gillis Santa, M.D Date: 04/16/2018; Time: 2:32 PM  There are no Patient Instructions on file for this visit.

## 2018-04-16 NOTE — Patient Instructions (Signed)
You have been given Rx for Percocet to last until 05/17/2018.

## 2018-04-18 ENCOUNTER — Other Ambulatory Visit: Payer: Self-pay | Admitting: Family Medicine

## 2018-04-28 ENCOUNTER — Other Ambulatory Visit: Payer: Self-pay | Admitting: Family Medicine

## 2018-05-08 ENCOUNTER — Other Ambulatory Visit: Payer: Self-pay | Admitting: Family Medicine

## 2018-05-08 MED ORDER — PREGABALIN 75 MG PO CAPS: 75.0000 mg | ORAL_CAPSULE | Freq: Two times a day (BID) | ORAL | 1 refills | Status: DC

## 2018-05-08 MED ORDER — DULOXETINE HCL 60 MG PO CPEP: 60.0000 mg | ORAL_CAPSULE | Freq: Every day | ORAL | 3 refills | Status: DC

## 2018-05-08 NOTE — Telephone Encounter (Signed)
Pt needs refill on his   Lyrica  75mg   Duloxetine 60 mg  [facility]  Pt's call back is [phone]  [employer]

## 2018-05-14 ENCOUNTER — Other Ambulatory Visit: Payer: Self-pay

## 2018-05-14 ENCOUNTER — Ambulatory Visit
Payer: Medicare HMO | Attending: Student in an Organized Health Care Education/Training Program | Admitting: Student in an Organized Health Care Education/Training Program

## 2018-05-14 ENCOUNTER — Encounter: Payer: Self-pay | Admitting: Student in an Organized Health Care Education/Training Program

## 2018-05-14 VITALS — BP 166/100 | HR 98 | Temp 98.0°F | Ht 69.0 in | Wt 145.0 lb

## 2018-05-14 DIAGNOSIS — Z7982 Long term (current) use of aspirin: Secondary | ICD-10-CM | POA: Diagnosis not present

## 2018-05-14 DIAGNOSIS — I25119 Atherosclerotic heart disease of native coronary artery with unspecified angina pectoris: Secondary | ICD-10-CM | POA: Insufficient documentation

## 2018-05-14 DIAGNOSIS — Z5181 Encounter for therapeutic drug level monitoring: Secondary | ICD-10-CM | POA: Diagnosis not present

## 2018-05-14 DIAGNOSIS — G8929 Other chronic pain: Secondary | ICD-10-CM

## 2018-05-14 DIAGNOSIS — Z760 Encounter for issue of repeat prescription: Secondary | ICD-10-CM | POA: Insufficient documentation

## 2018-05-14 DIAGNOSIS — I10 Essential (primary) hypertension: Secondary | ICD-10-CM | POA: Insufficient documentation

## 2018-05-14 DIAGNOSIS — F419 Anxiety disorder, unspecified: Secondary | ICD-10-CM | POA: Diagnosis not present

## 2018-05-14 DIAGNOSIS — M5136 Other intervertebral disc degeneration, lumbar region: Secondary | ICD-10-CM | POA: Insufficient documentation

## 2018-05-14 DIAGNOSIS — Z79899 Other long term (current) drug therapy: Secondary | ICD-10-CM | POA: Insufficient documentation

## 2018-05-14 DIAGNOSIS — I69351 Hemiplegia and hemiparesis following cerebral infarction affecting right dominant side: Secondary | ICD-10-CM | POA: Insufficient documentation

## 2018-05-14 DIAGNOSIS — M21371 Foot drop, right foot: Secondary | ICD-10-CM | POA: Insufficient documentation

## 2018-05-14 DIAGNOSIS — G894 Chronic pain syndrome: Secondary | ICD-10-CM | POA: Insufficient documentation

## 2018-05-14 DIAGNOSIS — Z8673 Personal history of transient ischemic attack (TIA), and cerebral infarction without residual deficits: Secondary | ICD-10-CM | POA: Diagnosis not present

## 2018-05-14 DIAGNOSIS — M5441 Lumbago with sciatica, right side: Secondary | ICD-10-CM | POA: Diagnosis not present

## 2018-05-14 DIAGNOSIS — F339 Major depressive disorder, recurrent, unspecified: Secondary | ICD-10-CM | POA: Insufficient documentation

## 2018-05-14 DIAGNOSIS — Z886 Allergy status to analgesic agent status: Secondary | ICD-10-CM | POA: Diagnosis not present

## 2018-05-14 DIAGNOSIS — Z981 Arthrodesis status: Secondary | ICD-10-CM | POA: Insufficient documentation

## 2018-05-14 DIAGNOSIS — J449 Chronic obstructive pulmonary disease, unspecified: Secondary | ICD-10-CM | POA: Insufficient documentation

## 2018-05-14 DIAGNOSIS — F17210 Nicotine dependence, cigarettes, uncomplicated: Secondary | ICD-10-CM | POA: Insufficient documentation

## 2018-05-14 DIAGNOSIS — Z8249 Family history of ischemic heart disease and other diseases of the circulatory system: Secondary | ICD-10-CM | POA: Insufficient documentation

## 2018-05-14 DIAGNOSIS — M5442 Lumbago with sciatica, left side: Secondary | ICD-10-CM

## 2018-05-14 DIAGNOSIS — R531 Weakness: Secondary | ICD-10-CM | POA: Diagnosis not present

## 2018-05-14 DIAGNOSIS — Z79891 Long term (current) use of opiate analgesic: Secondary | ICD-10-CM | POA: Insufficient documentation

## 2018-05-14 DIAGNOSIS — I714 Abdominal aortic aneurysm, without rupture: Secondary | ICD-10-CM | POA: Diagnosis not present

## 2018-05-14 MED ORDER — PREGABALIN 75 MG PO CAPS: 150.0000 mg | ORAL_CAPSULE | Freq: Every evening | ORAL | 5 refills | Status: DC | PRN

## 2018-05-14 MED ORDER — OXYCODONE-ACETAMINOPHEN 10-325 MG PO TABS
1.0000 | ORAL_TABLET | Freq: Three times a day (TID) | ORAL | 0 refills | Status: DC | PRN
Start: 2018-05-14 — End: 2018-05-14

## 2018-05-14 MED ORDER — OXYCODONE-ACETAMINOPHEN 10-325 MG PO TABS: 1.0000 | ORAL_TABLET | Freq: Three times a day (TID) | ORAL | 0 refills | Status: DC | PRN

## 2018-05-14 NOTE — Progress Notes (Signed)
Patient's Name: Logan Vazquez  MRN: [id number]  Referring Provider: Virginia Crews, MD  DOB: June 13, 1946  PCP: Virginia Crews, MD  DOS: 05/14/2018  Note by: Gillis Santa, MD  Service setting: Ambulatory outpatient  Specialty: Interventional Pain Management  Location: [facility] (AMB) Pain Management Facility    Patient type: Established   Primary Reason(s) for Visit: Encounter for prescription drug management. (Level of risk: moderate)  CC: Medication Refill (Oxycodone )  HPI  [name] is a 72 y.o. year old, male patient, who comes today for a medication management evaluation. He has Hypertension; Tobacco dependence; AAA (abdominal aortic aneurysm) without rupture ([place]); Chronic back pain; Depression, recurrent (H[facility]); COPD (chronic obstructive pulmonary disease) ([place]); Anxiety; Coronary artery disease involving native coronary artery of native heart with angina pectoris ([place]); History of CVA (cerebrovascular accident); Acquired right foot drop; History of lumbar spinal fusion; and Lumbar degenerative disc disease on their problem list. His primarily concern today is the Medication Refill (Oxycodone )  Pain Assessment: Location: Lower Back Radiating: Radiates from lower back to back of legs to ankels bilateral  Onset: More than a month ago Duration: Chronic pain Quality: Aching, Constant Severity: 7 /10 (subjective, self-reported pain score)  Note: Reported level is inconsistent with clinical observations.                         When using our objective Pain Scale, levels between 6 and 10/10 are said to belong in an emergency room, as it progressively worsens from a 6/10, described as severely limiting, requiring emergency care not usually available at an outpatient pain management facility. At a 6/10 level, communication becomes difficult and requires great effort. Assistance to reach the emergency department may be required. Facial flushing and profuse sweating along with potentially  dangerous increases in heart rate and blood pressure will be evident. Effect on ADL: Not able to be as active, has to stay at home because of pain, difficult for prolong walking Timing: Constant Modifying factors: Denies  BP: (!) 166/100  HR: 98  [name] was last scheduled for an appointment on 04/16/2018 for medication management. During today's appointment we reviewed [name] chronic pain status, as well as his outpatient medication regimen.  The patient  reports that he does not use drugs. His body mass index is 21.41 kg/m.  Further details on both, my assessment(s), as well as the proposed treatment plan, please see below.  Controlled Substance Pharmacotherapy Assessment REMS (Risk Evaluation and Mitigation Strategy)  Analgesic: Percocet 10 mg 3 times daily as needed, quantity 90/month MME/day: 45 mg/day.  Logan Napoleon, RN  05/14/2018  1:41 PM  Sign at close encounter Nursing Pain Medication Assessment:  Safety precautions to be maintained throughout the outpatient stay will include: orient to surroundings, keep bed in low position, maintain call bell within reach at all times, provide assistance with transfer out of bed and ambulation.  Medication Inspection Compliance: Pill count conducted under aseptic conditions, in front of the patient. Neither the pills nor the bottle was removed from the patient's sight at any time. Once count was completed pills were immediately returned to the patient in their original bottle.  Medication: Oxycodone/APAP Pill/Patch Count: 11 of 90 pills remain Pill/Patch Appearance: Markings consistent with prescribed medication Bottle Appearance: Standard pharmacy container. Clearly labeled. Filled Date: 05 / 03 / 2019   Last Medication intake:  Today   Safety precautions to be maintained throughout the outpatient stay will include:  orient to surroundings, keep bed in low position, maintain call bell within reach at all times, provide assistance  with transfer out of bed and ambulation.  Pharmacokinetics: Liberation and absorption (onset of action): WNL Distribution (time to peak effect): WNL Metabolism and excretion (duration of action): WNL         Pharmacodynamics: Desired effects: Analgesia: [name] reports >50% benefit. Functional ability: Patient reports that medication allows him to accomplish basic ADLs Clinically meaningful improvement in function (CMIF): Sustained CMIF goals met Perceived effectiveness: Described as relatively effective, allowing for increase in activities of daily living (ADL) Undesirable effects: Side-effects or Adverse reactions: None reported Monitoring: [place] PMP: Online review of the past 33-monthperiod conducted. Compliant with practice rules and regulations Last UDS on record: Summary  Date Value Ref Range Status  03/10/2018 FINAL  Final    Comment:    ==================================================================== TOXASSURE COMP DRUG ANALYSIS,UR ==================================================================== Test                             Result       Flag       Units Drug Present and Declared for Prescription Verification   Pregabalin                     PRESENT      EXPECTED   Duloxetine                     PRESENT      EXPECTED Drug Present not Declared for Prescription Verification   Mirtazapine                    PRESENT      UNEXPECTED Drug Absent but Declared for Prescription Verification   Salicylate                     Not Detected UNEXPECTED    Aspirin, as indicated in the declared medication list, is not    always detected even when used as directed. ==================================================================== Test                      Result    Flag   Units      Ref Range   Creatinine              159              mg/dL      >=20 ==================================================================== Declared Medications:  The flagging and interpretation  on this report are based on the  following declared medications.  Unexpected results may arise from  inaccuracies in the declared medications.  **Note: The testing scope of this panel includes these medications:  Duloxetine  Pregabalin  **Note: The testing scope of this panel does not include small to  moderate amounts of these reported medications:  Aspirin (Aspirin 81)  **Note: The testing scope of this panel does not include following  reported medications:  Albuterol  Atorvastatin  Cholecalciferol  Furosemide  Lisinopril  Nicotine  Nitroglycerin (NTG)  Omeprazole  Vitamin B12 ==================================================================== For clinical consultation, please call ([phone] ====================================================================    UDS interpretation: Compliant          Medication Assessment Form: Reviewed. Patient indicates being compliant with therapy Treatment compliance: Compliant Risk Assessment Profile: Aberrant behavior: See prior evaluations. None observed or detected today Comorbid factors increasing risk of overdose: See prior notes. No  additional risks detected today Risk of substance use disorder (SUD): Low Opioid Risk Tool - 05/14/18 1339      Family History of Substance Abuse   Alcohol  Negative    Illegal Drugs  Negative    Rx Drugs  Negative      Personal History of Substance Abuse   Alcohol  Negative    Illegal Drugs  Negative    Rx Drugs  Negative      Age   Age between 40-45 years   No      History of Preadolescent Sexual Abuse   History of Preadolescent Sexual Abuse  Negative or Male      Psychological Disease   Psychological Disease  Negative    Depression  Positive      Total Score   Opioid Risk Tool Scoring  1    Opioid Risk Interpretation  Low Risk      ORT Scoring interpretation table:  Score <3 = Low Risk for SUD  Score between 4-7 = Moderate Risk for SUD  Score >8 = High Risk for Opioid  Abuse   Risk Mitigation Strategies:  Patient Counseling: Covered Patient-Prescriber Agreement (PPA): Present and active  Notification to other healthcare providers: Done  Pharmacologic Plan: No change in therapy, at this time.             Laboratory Chemistry  Inflammation Markers (CRP: Acute Phase) (ESR: Chronic Phase) No results found for: CRP, ESRSEDRATE, LATICACIDVEN                       Rheumatology Markers No results found for: RF, ANA, LABURIC, URICUR, LYMEIGGIGMAB, LYMEABIGMQN, HLAB27                      Renal Function Markers Lab Results  Component Value Date   BUN 15 10/23/2017   CREATININE 0.92 16/09/9603   BCR NOT APPLICABLE 54/08/8118                              Hepatic Function Markers Lab Results  Component Value Date   AST 14 10/23/2017   ALT 10 10/23/2017                        Electrolytes Lab Results  Component Value Date   NA 138 10/23/2017   K 4.9 10/23/2017   CL 102 10/23/2017   CALCIUM 9.5 10/23/2017                        Neuropathy Markers No results found for: VITAMINB12, FOLATE, HGBA1C, HIV                      Bone Pathology Markers No results found for: VD25OH, VD125OH2TOT, [id number], [id number], 25OHVITD1, 25OHVITD2, 25OHVITD3, TESTOFREE, TESTOSTERONE                       Coagulation Parameters Lab Results  Component Value Date   PLT 349 10/23/2017                        Cardiovascular Markers Lab Results  Component Value Date   HGB 14.7 10/23/2017   HCT 43.1 10/23/2017  CA Markers No results found for: CEA, CA125, LABCA2                      Note: Lab results reviewed.  Recent Diagnostic Imaging Results  MR LUMBAR SPINE WO CONTRAST CLINICAL DATA:  Previous fusion in 1985. Now with pain in the hips and legs. Numbness in the legs.  EXAM: MRI LUMBAR SPINE WITHOUT CONTRAST  TECHNIQUE: Multiplanar, multisequence MR imaging of the lumbar spine was performed. No intravenous contrast was  administered.  COMPARISON:  None.  FINDINGS: Segmentation: I do not have plain films for correlation. L5-S1 interspace appears hypoplastic. There may be transitional anatomy.  Alignment: Straightening of the normal lumbar lordosis due to prior fusion L3-S1.  Vertebrae: Chronic T12 compression fracture. The patient has undergone some type of posterior fixation from L3 through S1. There is extensive metallic artifact. I do not have plain film correlates. No pedicle screws are placed. There appears to be solid interbody arthrodesis.  Conus medullaris and cauda equina: Conus extends to the L1 level. Conus and cauda equina appear normal.  Paraspinal and other soft tissues: Renal cystic disease, incompletely evaluated. Abdominal aortic aneurysm, 43 by 43 mm cross-section, with some mural thrombus.  Disc levels:  L1-L2: Disc desiccation and loss of disc height. Central protrusion. Posterior element hypertrophy. Moderate to severe stenosis. BILATERAL subarticular zone narrowing results in L2 nerve root impingement. BILATERAL foraminal narrowing, not clearly compressive.  L2-L3: Disc desiccation, slight loss of disc height. Central disc extrusion. Posterior element hypertrophy. Severe stenosis, probable near complete block with crowding of the cauda equina nerve roots centrally. Severe BILATERAL L3 nerve root impingement. BILATERAL foraminal narrowing likely affects both L2 nerve roots.  L3-L4:  Post fusion interspace, grossly unremarkable.  L4-L5:  Post fusion interspace, grossly unremarkable.  L5-S1:  Post fusion interspace, grossly unremarkable.  IMPRESSION: Solid appearing L3-S1 arthrodesis. Some type of metallic posterior fixation has been performed.  Severe stenosis at L2-3 related to posterior element hypertrophy and central disc extrusion. BILATERAL L3 and L2 nerve root impingement are likely.  Moderate to severe stenosis L1-2, central protrusion with  posterior element hypertrophy, with BILATERAL L2 nerve root impingement.  4.3 cm abdominal aortic aneurysm. Some mural thrombus is present. Recommend followup by [place] in 1 year. This recommendation follows ACR consensus guidelines: White Paper of the ACR Incidental Findings Committee II on Vascular Findings. J Am Coll Radiol 2013; 10:789-794.  Electronically Signed   By: Staci Righter M.D.   On: 03/05/2018 20:16  Complexity Note: Imaging results reviewed. Results shared with Mr. Corcoran, using Layman's terms.                         Meds   Current Outpatient Medications:  .  albuterol (PROVENTIL HFA;VENTOLIN HFA) 108 (90 Base) MCG/ACT inhaler, Inhale into the lungs., Disp: , Rfl:  .  aspirin EC 81 MG tablet, Take 81 mg by mouth., Disp: , Rfl:  .  atorvastatin (LIPITOR) 40 MG tablet, Take 1 tablet (40 mg total) daily by mouth., Disp: 30 tablet, Rfl: 3 .  Cholecalciferol (VITAMIN D-1000 MAX ST) 1000 units tablet, Take by mouth., Disp: , Rfl:  .  Cyanocobalamin (B-12) 2000 MCG TABS, Take by mouth., Disp: , Rfl:  .  furosemide (LASIX) 20 MG tablet, Take by mouth., Disp: , Rfl:  .  lisinopril (PRINIVIL,ZESTRIL) 40 MG tablet, Take 1 tablet (40 mg total) by mouth daily., Disp: 30 tablet, Rfl: 5 .  nitroGLYCERIN (  NITROSTAT) 0.4 MG SL tablet, Place under the tongue., Disp: , Rfl:  .  omeprazole (PRILOSEC) 20 MG capsule, , Disp: , Rfl:  .  oxyCODONE-acetaminophen (PERCOCET) 10-325 MG tablet, Take 1 tablet by mouth every 8 (eight) hours as needed for pain. For chronic pain To last for 30 days from fill date, Disp: 90 tablet, Rfl: 0 .  pregabalin (LYRICA) 75 MG capsule, Take 2 capsules (150 mg total) by mouth at bedtime as needed., Disp: 60 capsule, Rfl: 5 .  promethazine (PHENERGAN) 25 MG tablet, TAKE ONE TABLET BY MOUTH EVERY 8 HOURS AS NEEDED FOR NAUSEA, Disp: 60 tablet, Rfl: 1 .  nicotine (NICODERM CQ - DOSED IN MG/24 HOURS) 14 mg/24hr patch, Place 14 mg onto the skin daily., Disp: , Rfl:   ROS   Constitutional: Denies any fever or chills Gastrointestinal: No reported hemesis, hematochezia, vomiting, or acute GI distress Musculoskeletal: Denies any acute onset joint swelling, redness, loss of ROM, or weakness Neurological: No reported episodes of acute onset apraxia, aphasia, dysarthria, agnosia, amnesia, paralysis, loss of coordination, or loss of consciousness  Allergies  Mr. Marengo is allergic to ibuprofen.  [place]  Drug: Mr. Martis  reports that he does not use drugs. Alcohol:  reports that he does not drink alcohol. Tobacco:  reports that he has been smoking cigarettes.  He has a 14.00 pack-year smoking history. He has never used smokeless tobacco. Medical:  has a past medical history of Arthritis, Asthma, CAD (coronary artery disease), Depression, Emphysema lung ([place]), Glaucoma, and Hypertension. Surgical: Mr. Mcdill  has a past surgical history that includes heart surgery (07/1999); Hip surgery (Left, 1968); Back surgery (1985); Skin graft (1968); and Humerus fracture surgery (1968). Family: family history includes Cancer in his father; Hearing loss in his mother; Heart attack in his brother; Heart disease in his brother; Hypertension in his father and mother; Stroke in his sister.  Constitutional Exam  General appearance: Well nourished, well developed, and well hydrated. In no apparent acute distress Vitals:   05/14/18 1324 05/14/18 1330  BP: (!) 162/100 (!) 166/100  Pulse: 96 98  Temp: 98 F (36.7 C)   SpO2: 98%   Weight: 145 lb (65.8 kg)   Height: _0  (1.753 m)    BMI Assessment: Estimated body mass index is 21.41 kg/m as calculated from the following:   Height as of this encounter: _1  (1.753 m).   Weight as of this encounter: 145 lb (65.8 kg).  BMI interpretation table: BMI level Category Range association with higher incidence of chronic pain  <18 kg/m2 Underweight   18.5-24.9 kg/m2 Ideal body weight   25-29.9 kg/m2 Overweight Increased incidence by 20%   30-34.9 kg/m2 Obese (Class I) Increased incidence by 68%  35-39.9 kg/m2 Severe obesity (Class II) Increased incidence by 136%  >40 kg/m2 Extreme obesity (Class III) Increased incidence by 254%   Patient's current BMI Ideal Body weight  Body mass index is 21.41 kg/m. Ideal body weight: 70.7 kg (155 lb 13.8 oz)   BMI Readings from Last 4 Encounters:  05/14/18 21.41 kg/m  04/16/18 21.41 kg/m  03/18/18 22.15 kg/m  03/10/18 22.15 kg/m   Wt Readings from Last 4 Encounters:  05/14/18 145 lb (65.8 kg)  04/16/18 145 lb (65.8 kg)  03/18/18 150 lb (68 kg)  03/10/18 150 lb (68 kg)  Psych/Mental status: Alert, oriented x 3 (person, place, & time)       Eyes: PERLA Respiratory: No evidence of acute respiratory distress  Cervical Spine Area  Exam  Skin & Axial Inspection: No masses, redness, edema, swelling, or associated skin lesions Alignment: Symmetrical Functional ROM: Unrestricted ROM      Stability: No instability detected Muscle Tone/Strength: Functionally intact. No obvious neuro-muscular anomalies detected. Sensory (Neurological): Unimpaired Palpation: No palpable anomalies              Upper Extremity (UE) Exam    Side: Right upper extremity  Side: Left upper extremity  Skin & Extremity Inspection: Skin color, temperature, and hair growth are WNL. No peripheral edema or cyanosis. No masses, redness, swelling, asymmetry, or associated skin lesions. No contractures.  Skin & Extremity Inspection: Skin color, temperature, and hair growth are WNL. No peripheral edema or cyanosis. No masses, redness, swelling, asymmetry, or associated skin lesions. No contractures.  Functional ROM: Unrestricted ROM          Functional ROM: Unrestricted ROM          Muscle Tone/Strength: Functionally intact. No obvious neuro-muscular anomalies detected.  Muscle Tone/Strength: Functionally intact. No obvious neuro-muscular anomalies detected.  Sensory (Neurological): Unimpaired          Sensory  (Neurological): Unimpaired          Palpation: No palpable anomalies              Palpation: No palpable anomalies              Provocative Test(s):  Phalen's test: deferred Tinel's test: deferred Apley's scratch test (touch opposite shoulder):  Action 1 (Across chest): deferred Action 2 (Overhead): deferred Action 3 (LB reach): deferred   Provocative Test(s):  Phalen's test: deferred Tinel's test: deferred Apley's scratch test (touch opposite shoulder):  Action 1 (Across chest): deferred Action 2 (Overhead): deferred Action 3 (LB reach): deferred    Thoracic Spine Area Exam  Skin & Axial Inspection: No masses, redness, or swelling Alignment: Symmetrical Functional ROM: Unrestricted ROM Stability: No instability detected Muscle Tone/Strength: Functionally intact. No obvious neuro-muscular anomalies detected. Sensory (Neurological): Unimpaired Muscle strength & Tone: No palpable anomalies  Lumbar Spine Area Exam  Skin & Axial Inspection: Well healed scar from previous spine surgery detected Alignment: Symmetrical Functional ROM: Decreased ROM       Stability: No instability detected Muscle Tone/Strength: Functionally intact. No obvious neuro-muscular anomalies detected. Sensory (Neurological): Dermatomal pain pattern Palpation: Complains of area being tender to palpation Bilateral Fist Percussion Test Provocative Tests: Lumbar Hyperextension/rotation test: Positive bilaterally for facet joint pain. Lumbar quadrant test (Kemp's test): (+) bilateral for foraminal stenosis Lumbar Lateral bending test: (+) due to pain. Patrick's Maneuver: deferred today                   FABER test: deferred today       Thigh-thrust test: deferred today       S-I compression test: deferred today       S-I distraction test: deferred today        Gait & Posture Assessment  Ambulation: Patient ambulates using a walker Gait: Relatively normal for age and body habitus Posture: Difficulty  standing up straight, due to pain   Lower Extremity Exam    Side: Right lower extremity  Side: Left lower extremity  Stability: No instability observed          Stability: No instability observed          Skin & Extremity Inspection: Skin color, temperature, and hair growth are WNL. No peripheral edema or cyanosis. No masses, redness, swelling, asymmetry, or associated skin lesions. No  contractures.  Skin & Extremity Inspection: Skin color, temperature, and hair growth are WNL. No peripheral edema or cyanosis. No masses, redness, swelling, asymmetry, or associated skin lesions. No contractures.  Functional ROM: Unrestricted ROM                  Functional ROM: Unrestricted ROM                  Muscle Tone/Strength: Functionally intact. No obvious neuro-muscular anomalies detected.  Muscle Tone/Strength: Functionally intact. No obvious neuro-muscular anomalies detected.  Sensory (Neurological): Unimpaired  Sensory (Neurological): Unimpaired  Palpation: No palpable anomalies  Palpation: No palpable anomalies   Assessment  Primary Diagnosis & Pertinent Problem List: The primary encounter diagnosis was Chronic pain syndrome. Diagnoses of Lumbar degenerative disc disease, History of CVA (cerebrovascular accident), Right sided weakness, Acquired right foot drop, Chronic bilateral low back pain with bilateral sciatica, History of lumbar spinal fusion, and Controlled substance agreement signed were also pertinent to this visit.  Status Diagnosis  Controlled Controlled Controlled 1. Chronic pain syndrome   2. Lumbar degenerative disc disease   3. History of CVA (cerebrovascular accident)   4. Right sided weakness   5. Acquired right foot drop   6. Chronic bilateral low back pain with bilateral sciatica   7. History of lumbar spinal fusion   8. Controlled substance agreement signed      General Recommendations: The pain condition that the patient suffers from is best treated with a  multidisciplinary approach that involves an increase in physical activity to prevent de-conditioning and worsening of the pain cycle, as well as psychological counseling (formal and/or informal) to address the co-morbid psychological affects of pain. Treatment will often involve judicious use of pain medications and interventional procedures to decrease the pain, allowing the patient to participate in the physical activity that will ultimately produce long-lasting pain reductions. The goal of the multidisciplinary approach is to return the patient to a higher level of overall function and to restore their ability to perform activities of daily living.  72 year old male with a history of lumbar spine surgery greater than 20 years ago(L4/L5 laminectomy, L4 through S1 posterior fusion, with unremarkable hardware and incorporated bone grafting),CVA in 2018 that resulted in right-sided weakness who presents with axial low back pain that radiates into his right posterior thigh and also occasional right ankle pain. Pain has been chronic in nature. Patient was previously on very high-dose opioid therapy which included OxyContin 80 mg 4 times daily, oxycodone 10 mg up to 6 times a day.I notified the patient that his morphine milliequivalents will not exceed 60 at our clinic. Patient's most recent lumbar MRI shows L3-S1 arthrodesis along with associated severe stenosis at L2/3 and related to posterior element hypertrophy and central disc extrusion as well as bilateral L3 and L2 nerve root impingement.We discussed performing L2/3 epidural steroid injection but the patient would like to wait.  Patient returns today for medication refill.  We will refill his Percocet below at its current dose for 2 months.  I also recommended the patient increase his Lyrica to 150 mg nightly from 75 mg nightly to assist with sleep as well as pain control.  Patient endorsed understanding.  Plan: -Refill Percocet 10 mg 3 times  daily as needed, quantity 90 for 2 months -Increase Lyrica to 150 mill grams nightly.  Prescription provided.  Plan of Care  Pharmacotherapy (Medications Ordered): Meds ordered this encounter  Medications  . DISCONTD: oxyCODONE-acetaminophen (PERCOCET) 10-325 MG tablet  Sig: Take 1 tablet by mouth every 8 (eight) hours as needed for pain. For chronic pain To last for 30 days from fill date    Dispense:  90 tablet    Refill:  0    Do not place this medication, or any other prescription from our practice, on "Automatic Refill". Patient may have prescription filled one day early if pharmacy is closed on scheduled refill date.  To fill on or after: 05/17/18, 06/15/18  . pregabalin (LYRICA) 75 MG capsule    Sig: Take 2 capsules (150 mg total) by mouth at bedtime as needed.    Dispense:  60 capsule    Refill:  5  . oxyCODONE-acetaminophen (PERCOCET) 10-325 MG tablet    Sig: Take 1 tablet by mouth every 8 (eight) hours as needed for pain. For chronic pain To last for 30 days from fill date    Dispense:  90 tablet    Refill:  0    Do not place this medication, or any other prescription from our practice, on "Automatic Refill". Patient may have prescription filled one day early if pharmacy is closed on scheduled refill date.  To fill on or after: 05/17/18, 06/15/18   Provider-requested follow-up: Return in about 8 weeks (around 07/07/2018) for Medication Management. Time Note: Greater than 50% of the 25 minute(s) of face-to-face time spent with Mr. Cambre, was spent in counseling/coordination of care regarding: Mr. Shorb primary cause of pain, medication side effects, the opioid analgesic risks and possible complications, the appropriate use of his medications, the goals of pain management (increased in functionality), the medication agreement and the patient's responsibilities when it comes to controlled substances.  Future Appointments  Date Time Provider Cool  04/09/2019 10:30 AM  [facility] 2 [facility] None  04/09/2019 11:30 AM Dew, Erskine Squibb, MD [facility] None    Primary Care Physician: Virginia Crews, MD Location: [facility] Note by: Gillis Santa, M.D Date: 05/14/2018; Time: 1:59 PM  Patient Instructions  You have been given 2 prescriptions for Oxycodone to last until 07/16/2018 and 1 prescription for Lyrica.

## 2018-05-14 NOTE — Patient Instructions (Signed)
You have been given 2 prescriptions for Oxycodone to last until 07/16/2018 and 1 prescription for Lyrica.

## 2018-05-14 NOTE — Progress Notes (Signed)
Nursing Pain Medication Assessment:  Safety precautions to be maintained throughout the outpatient stay will include: orient to surroundings, keep bed in low position, maintain call bell within reach at all times, provide assistance with transfer out of bed and ambulation.  Medication Inspection Compliance: Pill count conducted under aseptic conditions, in front of the patient. Neither the pills nor the bottle was removed from the patient's sight at any time. Once count was completed pills were immediately returned to the patient in their original bottle.  Medication: Oxycodone/APAP Pill/Patch Count: 11 of 90 pills remain Pill/Patch Appearance: Markings consistent with prescribed medication Bottle Appearance: Standard pharmacy container. Clearly labeled. Filled Date: 05 / 03 / 2019   Last Medication intake:  Today   Safety precautions to be maintained throughout the outpatient stay will include: orient to surroundings, keep bed in low position, maintain call bell within reach at all times, provide assistance with transfer out of bed and ambulation.

## 2018-05-25 ENCOUNTER — Ambulatory Visit: Payer: Self-pay | Admitting: Family Medicine

## 2018-05-28 ENCOUNTER — Ambulatory Visit (INDEPENDENT_AMBULATORY_CARE_PROVIDER_SITE_OTHER): Payer: Medicare HMO | Admitting: Family Medicine

## 2018-05-28 ENCOUNTER — Encounter: Payer: Self-pay | Admitting: Family Medicine

## 2018-05-28 VITALS — BP 140/84 | HR 102 | Temp 98.0°F | Resp 16

## 2018-05-28 DIAGNOSIS — F339 Major depressive disorder, recurrent, unspecified: Secondary | ICD-10-CM

## 2018-05-28 DIAGNOSIS — F419 Anxiety disorder, unspecified: Secondary | ICD-10-CM

## 2018-05-28 DIAGNOSIS — M21371 Foot drop, right foot: Secondary | ICD-10-CM | POA: Diagnosis not present

## 2018-05-28 MED ORDER — FLUOXETINE HCL 20 MG PO TABS: 20.0000 mg | ORAL_TABLET | Freq: Every day | ORAL | 1 refills | Status: DC

## 2018-05-28 MED ORDER — CELECOXIB 100 MG PO CAPS: 100.0000 mg | ORAL_CAPSULE | Freq: Two times a day (BID) | ORAL | 3 refills | Status: DC

## 2018-05-28 MED ORDER — OMEPRAZOLE 20 MG PO CPDR
20.0000 mg | DELAYED_RELEASE_CAPSULE | Freq: Every day | ORAL | 3 refills | Status: DC
Start: 2018-05-28 — End: 2018-11-09

## 2018-05-28 MED ORDER — ATORVASTATIN CALCIUM 40 MG PO TABS: 40.0000 mg | ORAL_TABLET | Freq: Every day | ORAL | 3 refills | Status: DC

## 2018-05-28 NOTE — Assessment & Plan Note (Addendum)
Significant depression with SI, but no active plan Contracted for safety Patient does not want to try therapy States that his Cymbalta is not helping He had previously been switched from Paxil Patient has been advised in the past and was advised again today that I have no intention of resuming benzos for him Given his history of falls, age, and concurrent opioid use, I do not believe it is safe for him to take benzos We will stop Cymbalta and instead try Prozac

## 2018-05-28 NOTE — Assessment & Plan Note (Signed)
Patient states the [facility] PT told him that he would be getting a brace ~1-2 months ago He is angry that this never came to his house Explained that I did not know anything about this brace Discussed that he was referred to PM&R in the past for adaptive braces but declines referral when he learned they would not Rx narcotics (see referral notes) We was referred again and it was left that he would call back if he had a ride Advised him to get an appt (Phone number given) to discuss any need for adaptive devices

## 2018-05-28 NOTE — Patient Instructions (Addendum)
Do not take Duloxetine Start Prozac  Dr Chaney Malling (Physical Medicine and Rehab, can determine bracing needs    [phone]   Major Depressive Disorder, Adult Major depressive disorder (MDD) is a mental health condition. MDD often makes you feel sad, hopeless, or helpless. MDD can also cause symptoms in your body. MDD can affect your:  Work.  School.  Relationships.  Other normal activities.  MDD can range from mild to very bad. It may occur once (single episode MDD). It can also occur many times (recurrent MDD). The main symptoms of MDD often include:  Feeling sad, depressed, or irritable most of the time.  Loss of interest.  MDD symptoms also include:  Sleeping too much or too little.  Eating too much or too little.  A change in your weight.  Feeling tired (fatigue) or having low energy.  Feeling worthless.  Feeling guilty.  Trouble making decisions.  Trouble thinking clearly.  Thoughts of suicide or harming others.  Feeling weak.  Feeling agitated.  Keeping yourself from being around other people (isolation).  Follow these instructions at home: Activity  Do these things as told by your doctor: ? Go back to your normal activities. ? Exercise regularly. ? Spend time outdoors. Alcohol  Talk with your doctor about how alcohol can affect your antidepressant medicines.  Do not drink alcohol. Or, limit how much alcohol you drink. ? This means no more than 1 drink a day for nonpregnant women and 2 drinks a day for men. One drink equals one of these:  12 oz of beer.  5 oz of wine.  1 oz of hard liquor. General instructions  Take over-the-counter and prescription medicines only as told by your doctor.  Eat a healthy diet.  Get plenty of sleep.  Find activities that you enjoy. Make time to do them.  Think about joining a support group. Your doctor may be able to suggest a group for you.  Keep all follow-up visits as told by your doctor. This is  important. Where to find more information:  [employer] on Mental Illness: ? www.nami.org  [place] [employer] of Mental Health: ? http://www.maynard.net/  National Suicide Prevention Lifeline: ? 5025936957. This is free, 24-hour help. Contact a doctor if:  Your symptoms get worse.  You have new symptoms. Get help right away if:  You self-harm.  You see, hear, taste, smell, or feel things that are not present (hallucinate). If you ever feel like you may hurt yourself or others, or have thoughts about taking your own life, get help right away. You can go to your nearest emergency department or call:  Your local emergency services (911 in the [place]  A suicide crisis helpline, such as the National Suicide Prevention Lifeline: ? (404)282-3775. This is open 24 hours a day.  This information is not intended to replace advice given to you by your health care provider. Make sure you discuss any questions you have with your health care provider. Document Released: 11/13/2015 Document Revised: 08/18/2016 Document Reviewed: 08/18/2016 Elsevier Interactive Patient Education  2017 [employer].

## 2018-05-28 NOTE — Progress Notes (Signed)
Patient: Logan Vazquez Male    DOB: [birth year]   72 y.o.   MRN: [id number] Visit Date: 05/28/2018  Today's Provider: Shirlee Latch, MD   I, Joslyn Hy, CMA, am acting as scribe for Shirlee Latch, MD.  Chief Complaint  Patient presents with  . Depression   Subjective:    HPI   Pt wants to discuss depression. He was previously taking Cymbalta 60 mg. He has not been on the medication in a while. He also states they do not work when he does take the medication. He also states he is frustrated with his recent stroke, and states is he had a gun he would shoot himself. He states he has been panicked all week. She reports he has palpitations and SOB.  Previously was taking Xanax and Klonopin in the past.  States that his "nerves got bad" this week.  Has never seen a therapist or psychiatrist in the past.  When asked if he had ever taken any other medications for depression or anxiety, he says no.  I listed out Zoloft, Celexa, Prozac, Effexor, Lexapro, and he says he has never taken any of these.  Then when I offer to start Zoloft, he states this has not worked in the past.  Then the same with prozac and celexa.  He states that he only wants klonopin or xanax.  Allergies  Allergen Reactions  . Ibuprofen Nausea And Vomiting     Current Outpatient Medications:  .  albuterol (PROVENTIL HFA;VENTOLIN HFA) 108 (90 Base) MCG/ACT inhaler, Inhale into the lungs., Disp: , Rfl:  .  aspirin EC 81 MG tablet, Take 81 mg by mouth., Disp: , Rfl:  .  atorvastatin (LIPITOR) 40 MG tablet, Take 1 tablet (40 mg total) daily by mouth., Disp: 30 tablet, Rfl: 3 .  celecoxib (CELEBREX) 100 MG capsule, Take 100 mg by mouth 2 (two) times daily., Disp: , Rfl:  .  Cholecalciferol (VITAMIN D-1000 MAX ST) 1000 units tablet, Take by mouth., Disp: , Rfl:  .  Cyanocobalamin (B-12) 2000 MCG TABS, Take by mouth., Disp: , Rfl:  .  docusate sodium (COLACE) 100 MG capsule, Take 100 mg by mouth 2 (two) times  daily., Disp: , Rfl:  .  furosemide (LASIX) 20 MG tablet, Take by mouth., Disp: , Rfl:  .  lisinopril (PRINIVIL,ZESTRIL) 40 MG tablet, Take 1 tablet (40 mg total) by mouth daily., Disp: 30 tablet, Rfl: 5 .  nitroGLYCERIN (NITROSTAT) 0.4 MG SL tablet, Place under the tongue., Disp: , Rfl:  .  omeprazole (PRILOSEC) 20 MG capsule, , Disp: , Rfl:  .  oxyCODONE-acetaminophen (PERCOCET) 10-325 MG tablet, Take 1 tablet by mouth every 8 (eight) hours as needed for pain. For chronic pain To last for 30 days from fill date, Disp: 90 tablet, Rfl: 0 .  pregabalin (LYRICA) 75 MG capsule, Take 2 capsules (150 mg total) by mouth at bedtime as needed., Disp: 60 capsule, Rfl: 5 .  ramipril (ALTACE) 5 MG capsule, , Disp: , Rfl:   Review of Systems  Constitutional: Positive for appetite change (loss), diaphoresis, fatigue and unexpected weight change (loss). Negative for activity change, chills and fever.  Respiratory: Positive for shortness of breath.   Cardiovascular: Positive for palpitations. Negative for chest pain and leg swelling.  Psychiatric/Behavioral: Positive for agitation, confusion, decreased concentration, dysphoric mood, hallucinations and suicidal ideas. Negative for self-injury and sleep disturbance. The patient is not nervous/anxious.     Social History   Tobacco Use  .  Smoking status: Current Every Day Smoker    Packs/day: 0.25    Years: 56.00    Pack years: 14.00    Types: Cigarettes  . Smokeless tobacco: Never Used  . Tobacco comment: started smoking at age 66; has decreased cigarette use to 2 cigarettes per day from 1.5 PPD  Substance Use Topics  . Alcohol use: No   Objective:   BP 140/84 (BP Location: Left Arm, Patient Position: Sitting, Cuff Size: Normal)   Pulse (!) 102   Temp 98 F (36.7 C) (Oral)   Resp 16   SpO2 95%  Vitals:   05/28/18 1350  BP: 140/84  Pulse: (!) 102  Resp: 16  Temp: 98 F (36.7 C)  TempSrc: Oral  SpO2: 95%     Physical Exam    Constitutional: He is oriented to person, place, and time. He appears well-developed and well-nourished. No distress.  HENT:  Head: Normocephalic and atraumatic.  Eyes: Conjunctivae are normal.  Neck: Neck supple. No thyromegaly present.  Cardiovascular: Normal rate, regular rhythm, normal heart sounds and intact distal pulses.  No murmur heard. Pulmonary/Chest: Effort normal and breath sounds normal. No respiratory distress. He has no wheezes. He has no rales.  Musculoskeletal: He exhibits no edema.  Lymphadenopathy:    He has no cervical adenopathy.  Neurological: He is alert and oriented to person, place, and time.  Skin: Skin is warm and dry. Capillary refill takes less than 2 seconds. No rash noted.  Psychiatric: His speech is normal. His affect is angry and labile. He is agitated. Cognition and memory are normal. He expresses impulsivity. He exhibits a depressed mood. He expresses suicidal ideation. He expresses no homicidal ideation. He expresses no suicidal plans and no homicidal plans.  Vitals reviewed.   Depression screen [facility] 2/9 05/28/2018 05/14/2018 04/16/2018 03/18/2018 03/10/2018  Decreased Interest 0 2 3 0 0  Down, Depressed, Hopeless 3 1 1  0 0  PHQ - 2 Score 3 3 4  0 0  Altered sleeping 3 3 3  - -  Tired, decreased energy 3 3 3  - -  Change in appetite 3 0 0 - -  Feeling bad or failure about yourself  3 2 2  - -  Trouble concentrating 3 0 0 - -  Moving slowly or fidgety/restless 3 0 0 - -  Suicidal thoughts 3 0 0 - -  PHQ-9 Score 24 11 12  - -  Difficult doing work/chores Very difficult Somewhat difficult Somewhat difficult - -    GAD 7 : Generalized Anxiety Score 05/28/2018 10/23/2017  Nervous, Anxious, on Edge 3 3  Control/stop worrying 3 0  Worry too much - different things 3 3  Trouble relaxing 3 0  Restless 3 1  Easily annoyed or irritable 3 3  Afraid - awful might happen 0 2  Total GAD 7 Score 18 12  Anxiety Difficulty Very difficult Extremely difficult       Assessment & Plan:     Problem List Items Addressed This Visit      Other   Depression, recurrent (HCC) - Primary    Significant depression with SI, but no active plan Contracted for safety Patient does not want to try therapy States that his Cymbalta is not helping He had previously been switched from Paxil Patient has been advised in the past and was advised again today that I have no intention of resuming benzos for him Given his history of falls, age, and concurrent opioid use, I do not believe it is  safe for him to take benzos We will stop Cymbalta and instead try Prozac      Relevant Medications   FLUoxetine (PROZAC) 20 MG tablet   Anxiety    See plan above for depression Stop Cymbalta, start Prozac No intention to resume benzos Discussed harms of chronic benzo therapy Advised on importance of counseling, but patient refuses      Relevant Medications   FLUoxetine (PROZAC) 20 MG tablet   Acquired right foot drop    Patient states the [facility] PT told him that he would be getting a brace ~1-2 months ago He is angry that this never came to his house Explained that I did not know anything about this brace Discussed that he was referred to PM&R in the past for adaptive braces but declines referral when he learned they would not Rx narcotics (see referral notes) We was referred again and it was left that he would call back if he had a ride Advised him to get an appt (Phone number given) to discuss any need for adaptive devices          Return in about 1 month (around 06/25/2018) for depression f/u.   The entirety of the information documented in the History of Present Illness, Review of Systems and Physical Exam were personally obtained by me. Portions of this information were initially documented by Irving Burton Ratchford, CMA and reviewed by me for thoroughness and accuracy.    Erasmo Downer, MD, MPH [facility] 05/29/2018 1:00 PM

## 2018-05-29 NOTE — Assessment & Plan Note (Signed)
See plan above for depression Stop Cymbalta, start Prozac No intention to resume benzos Discussed harms of chronic benzo therapy Advised on importance of counseling, but patient refuses

## 2018-06-11 ENCOUNTER — Telehealth: Payer: Self-pay | Admitting: Family Medicine

## 2018-06-11 NOTE — Telephone Encounter (Signed)
Calling to report O2 stats 97% room air 130/82 83 heart rate, 98.6 temp, wheezing all lobes on right side.  States the patient states this is normal and pt is still smoking.

## 2018-06-11 NOTE — Telephone Encounter (Signed)
Called Logan Vazquez No answer/voicemail busy

## 2018-06-11 NOTE — Telephone Encounter (Signed)
O2 sats are pretty good. Last time here he was around 95%. Continue to monitor and schedule appt if O2 sats drop or if he develops cough. May use albuterol inhaler prn.

## 2018-06-11 NOTE — Telephone Encounter (Signed)
Please Review

## 2018-06-12 NOTE — Telephone Encounter (Signed)
I called Logan Vazquez and advised her as below. She verbally voiced understanding. I also called patient and advised Logan Vazquez as below.

## 2018-06-25 ENCOUNTER — Ambulatory Visit: Payer: Self-pay | Admitting: Family Medicine

## 2018-06-25 ENCOUNTER — Telehealth: Payer: Self-pay | Admitting: Family Medicine

## 2018-06-25 NOTE — Progress Notes (Deleted)
       Patient: Logan Vazquez Male    DOB: May 03, 1946   72 y.o.   MRN: [id number] Visit Date: 06/25/2018  Today's Provider: Shirlee Latch, MD   I, Joslyn Hy, CMA, am acting as scribe for Shirlee Latch, MD.  No chief complaint on file.  Subjective:    Depression         Chronicity: FU from 05/28/2018; Cymbalta was D/C at LOV, and Prozac 20 mg was started.      Allergies  Allergen Reactions  . Ibuprofen Nausea And Vomiting     Current Outpatient Medications:  .  albuterol (PROVENTIL HFA;VENTOLIN HFA) 108 (90 Base) MCG/ACT inhaler, Inhale into the lungs., Disp: , Rfl:  .  aspirin EC 81 MG tablet, Take 81 mg by mouth., Disp: , Rfl:  .  atorvastatin (LIPITOR) 40 MG tablet, Take 1 tablet (40 mg total) by mouth daily., Disp: 30 tablet, Rfl: 3 .  celecoxib (CELEBREX) 100 MG capsule, Take 1 capsule (100 mg total) by mouth 2 (two) times daily., Disp: 180 capsule, Rfl: 3 .  Cholecalciferol (VITAMIN D-1000 MAX ST) 1000 units tablet, Take by mouth., Disp: , Rfl:  .  Cyanocobalamin (B-12) 2000 MCG TABS, Take by mouth., Disp: , Rfl:  .  docusate sodium (COLACE) 100 MG capsule, Take 100 mg by mouth 2 (two) times daily., Disp: , Rfl:  .  FLUoxetine (PROZAC) 20 MG tablet, Take 1 tablet (20 mg total) by mouth daily., Disp: 30 tablet, Rfl: 1 .  furosemide (LASIX) 20 MG tablet, Take by mouth., Disp: , Rfl:  .  lisinopril (PRINIVIL,ZESTRIL) 40 MG tablet, Take 1 tablet (40 mg total) by mouth daily., Disp: 30 tablet, Rfl: 5 .  nitroGLYCERIN (NITROSTAT) 0.4 MG SL tablet, Place under the tongue., Disp: , Rfl:  .  omeprazole (PRILOSEC) 20 MG capsule, Take 1 capsule (20 mg total) by mouth daily., Disp: 90 capsule, Rfl: 3 .  pregabalin (LYRICA) 75 MG capsule, Take 2 capsules (150 mg total) by mouth at bedtime as needed., Disp: 60 capsule, Rfl: 5  Review of Systems  Psychiatric/Behavioral: Positive for depression.    Social History   Tobacco Use  . Smoking status: Current Every Day  Smoker    Packs/day: 0.25    Years: 56.00    Pack years: 14.00    Types: Cigarettes  . Smokeless tobacco: Never Used  . Tobacco comment: started smoking at age 74; has decreased cigarette use to 2 cigarettes per day from 1.5 PPD  Substance Use Topics  . Alcohol use: No   Objective:   There were no vitals taken for this visit. There were no vitals filed for this visit.   Physical Exam      Assessment & Plan:

## 2018-06-25 NOTE — Telephone Encounter (Signed)
Pt needs refill on  Mupirocin usp 2%  Proctozone - hc 2.5  [facility]  Pt's CB # [id number]  teri

## 2018-06-26 MED ORDER — MUPIROCIN CALCIUM 2 % EX CREA: 1.0000 "application " | TOPICAL_CREAM | Freq: Two times a day (BID) | CUTANEOUS | 0 refills | Status: DC

## 2018-06-26 MED ORDER — HYDROCORTISONE 2.5 % RE CREA: 1.0000 "application " | TOPICAL_CREAM | Freq: Two times a day (BID) | RECTAL | 0 refills | Status: DC

## 2018-06-26 NOTE — Telephone Encounter (Signed)
Pt called to see if the refills for Mupirocin usp 2% &  Proctozone - hc 2.5 could be sent to [facility] today if possible. Pt also scheduled follow-up OV for 07/10/18. Please advise. Thanks TNP

## 2018-06-26 NOTE — Telephone Encounter (Signed)
Sent in

## 2018-06-26 NOTE — Telephone Encounter (Signed)
Yes. OK to send refills  Beryle Flock Marzella Schlein, MD, MPH [facility] 06/26/2018 8:03 AM

## 2018-06-26 NOTE — Telephone Encounter (Signed)
Ok to refill 

## 2018-07-01 ENCOUNTER — Telehealth: Payer: Self-pay

## 2018-07-01 MED ORDER — MUPIROCIN 2 % EX OINT: 1.0000 "application " | TOPICAL_OINTMENT | Freq: Two times a day (BID) | CUTANEOUS | 0 refills | Status: DC

## 2018-07-01 NOTE — Telephone Encounter (Signed)
Sent to pharmacy 

## 2018-07-01 NOTE — Telephone Encounter (Signed)
Bactroban 2% has been rejected by insurance. Preferred drugs include mupirocin topical ointment or gentamicin topical cream. Please advise.

## 2018-07-01 NOTE — Telephone Encounter (Signed)
Ok to Rx mupirocin ointment BID prn instead of Bactroban cream.  Bacigalupo, Marzella Schlein, MD, MPH [facility] 07/01/2018 3:04 PM

## 2018-07-07 ENCOUNTER — Ambulatory Visit: Payer: Medicare HMO | Admitting: Student in an Organized Health Care Education/Training Program

## 2018-07-08 ENCOUNTER — Encounter: Payer: Self-pay | Admitting: Nurse Practitioner

## 2018-07-08 ENCOUNTER — Ambulatory Visit
Payer: Medicare HMO | Attending: Student in an Organized Health Care Education/Training Program | Admitting: Nurse Practitioner

## 2018-07-08 VITALS — BP 112/79 | HR 91 | Temp 98.4°F | Resp 16 | Ht 67.0 in | Wt 145.0 lb

## 2018-07-08 DIAGNOSIS — J439 Emphysema, unspecified: Secondary | ICD-10-CM | POA: Diagnosis not present

## 2018-07-08 DIAGNOSIS — M545 Low back pain: Secondary | ICD-10-CM | POA: Diagnosis present

## 2018-07-08 DIAGNOSIS — H409 Unspecified glaucoma: Secondary | ICD-10-CM | POA: Insufficient documentation

## 2018-07-08 DIAGNOSIS — I10 Essential (primary) hypertension: Secondary | ICD-10-CM | POA: Diagnosis not present

## 2018-07-08 DIAGNOSIS — Z822 Family history of deafness and hearing loss: Secondary | ICD-10-CM | POA: Insufficient documentation

## 2018-07-08 DIAGNOSIS — Z955 Presence of coronary angioplasty implant and graft: Secondary | ICD-10-CM | POA: Diagnosis not present

## 2018-07-08 DIAGNOSIS — M769 Unspecified enthesopathy, lower limb, excluding foot: Secondary | ICD-10-CM | POA: Insufficient documentation

## 2018-07-08 DIAGNOSIS — I712 Thoracic aortic aneurysm, without rupture: Secondary | ICD-10-CM | POA: Diagnosis not present

## 2018-07-08 DIAGNOSIS — Z823 Family history of stroke: Secondary | ICD-10-CM | POA: Diagnosis not present

## 2018-07-08 DIAGNOSIS — F3341 Major depressive disorder, recurrent, in partial remission: Secondary | ICD-10-CM | POA: Diagnosis not present

## 2018-07-08 DIAGNOSIS — F419 Anxiety disorder, unspecified: Secondary | ICD-10-CM | POA: Insufficient documentation

## 2018-07-08 DIAGNOSIS — Z9181 History of falling: Secondary | ICD-10-CM | POA: Diagnosis not present

## 2018-07-08 DIAGNOSIS — F17210 Nicotine dependence, cigarettes, uncomplicated: Secondary | ICD-10-CM | POA: Insufficient documentation

## 2018-07-08 DIAGNOSIS — Z791 Long term (current) use of non-steroidal anti-inflammatories (NSAID): Secondary | ICD-10-CM | POA: Insufficient documentation

## 2018-07-08 DIAGNOSIS — Z79899 Other long term (current) drug therapy: Secondary | ICD-10-CM | POA: Diagnosis not present

## 2018-07-08 DIAGNOSIS — Z7982 Long term (current) use of aspirin: Secondary | ICD-10-CM | POA: Diagnosis not present

## 2018-07-08 DIAGNOSIS — K219 Gastro-esophageal reflux disease without esophagitis: Secondary | ICD-10-CM | POA: Diagnosis not present

## 2018-07-08 DIAGNOSIS — Z8673 Personal history of transient ischemic attack (TIA), and cerebral infarction without residual deficits: Secondary | ICD-10-CM | POA: Diagnosis not present

## 2018-07-08 DIAGNOSIS — G8929 Other chronic pain: Secondary | ICD-10-CM | POA: Insufficient documentation

## 2018-07-08 DIAGNOSIS — M5442 Lumbago with sciatica, left side: Secondary | ICD-10-CM | POA: Diagnosis not present

## 2018-07-08 DIAGNOSIS — Z886 Allergy status to analgesic agent status: Secondary | ICD-10-CM | POA: Insufficient documentation

## 2018-07-08 DIAGNOSIS — M5116 Intervertebral disc disorders with radiculopathy, lumbar region: Secondary | ICD-10-CM | POA: Diagnosis not present

## 2018-07-08 DIAGNOSIS — Z79891 Long term (current) use of opiate analgesic: Secondary | ICD-10-CM | POA: Diagnosis not present

## 2018-07-08 DIAGNOSIS — G894 Chronic pain syndrome: Secondary | ICD-10-CM | POA: Diagnosis not present

## 2018-07-08 DIAGNOSIS — Z981 Arthrodesis status: Secondary | ICD-10-CM | POA: Insufficient documentation

## 2018-07-08 DIAGNOSIS — M5136 Other intervertebral disc degeneration, lumbar region: Secondary | ICD-10-CM | POA: Diagnosis not present

## 2018-07-08 DIAGNOSIS — I2510 Atherosclerotic heart disease of native coronary artery without angina pectoris: Secondary | ICD-10-CM | POA: Diagnosis not present

## 2018-07-08 DIAGNOSIS — I714 Abdominal aortic aneurysm, without rupture: Secondary | ICD-10-CM | POA: Insufficient documentation

## 2018-07-08 DIAGNOSIS — R110 Nausea: Secondary | ICD-10-CM | POA: Insufficient documentation

## 2018-07-08 DIAGNOSIS — M5441 Lumbago with sciatica, right side: Secondary | ICD-10-CM

## 2018-07-08 DIAGNOSIS — Z8249 Family history of ischemic heart disease and other diseases of the circulatory system: Secondary | ICD-10-CM | POA: Diagnosis not present

## 2018-07-08 MED ORDER — OXYCODONE-ACETAMINOPHEN 10-325 MG PO TABS: 1.0000 | ORAL_TABLET | Freq: Three times a day (TID) | ORAL | 0 refills | Status: DC | PRN

## 2018-07-08 NOTE — Patient Instructions (Addendum)
____________________________________________________________________________________________  Medication Rules  Applies to: All patients receiving prescriptions (written or electronic).  Pharmacy of record: Pharmacy where electronic prescriptions will be sent. If written prescriptions are taken to a different pharmacy, please inform the nursing staff. The pharmacy listed in the electronic medical record should be the one where you would like electronic prescriptions to be sent.  Prescription refills: Only during scheduled appointments. Applies to both, written and electronic prescriptions.  NOTE: The following applies primarily to controlled substances (Opioid* Pain Medications).   Patient's responsibilities: 1. Pain Pills: Bring all pain pills to every appointment (except for procedure appointments). 2. Pill Bottles: Bring pills in original pharmacy bottle. Always bring newest bottle. Bring bottle, even if empty. 3. Medication refills: You are responsible for knowing and keeping track of what medications you need refilled. The day before your appointment, write a list of all prescriptions that need to be refilled. Bring that list to your appointment and give it to the admitting nurse. Prescriptions will be written only during appointments. If you forget a medication, it will not be "Called in", "Faxed", or "electronically sent". You will need to get another appointment to get these prescribed. 4. Prescription Accuracy: You are responsible for carefully inspecting your prescriptions before leaving our office. Have the discharge nurse carefully go over each prescription with you, before taking them home. Make sure that your name is accurately spelled, that your address is correct. Check the name and dose of your medication to make sure it is accurate. Check the number of pills, and the written instructions to make sure they are clear and accurate. Make sure that you are given enough medication to last  until your next medication refill appointment. 5. Taking Medication: Take medication as prescribed. Never take more pills than instructed. Never take medication more frequently than prescribed. Taking less pills or less frequently is permitted and encouraged, when it comes to controlled substances (written prescriptions).  6. Inform other Doctors: Always inform, all of your healthcare providers, of all the medications you take. 7. Pain Medication from other Providers: You are not allowed to accept any additional pain medication from any other Doctor or Healthcare provider. There are two exceptions to this rule. (see below) In the event that you require additional pain medication, you are responsible for notifying us, as stated below. 8. Medication Agreement: You are responsible for carefully reading and following our Medication Agreement. This must be signed before receiving any prescriptions from our practice. Safely store a copy of your signed Agreement. Violations to the Agreement will result in no further prescriptions. (Additional copies of our Medication Agreement are available upon request.) 9. Laws, Rules, & Regulations: All patients are expected to follow all Federal and State Laws, Statutes, Rules, & Regulations. Ignorance of the Laws does not constitute a valid excuse. The use of any illegal substances is prohibited. 10. Adopted CDC guidelines & recommendations: Target dosing levels will be at or below 60 MME/day. Use of benzodiazepines** is not recommended.  Exceptions: There are only two exceptions to the rule of not receiving pain medications from other Healthcare Providers. 1. Exception #1 (Emergencies): In the event of an emergency (i.e.: accident requiring emergency care), you are allowed to receive additional pain medication. However, you are responsible for: As soon as you are able, call our office [phone], at any time of the day or night, and leave a message stating your name, the  date and nature of the emergency, and the name and dose of the medication   prescribed. In the event that your call is answered by a member of our staff, make sure to document and save the date, time, and the name of the person that took your information.  2. Exception #2 (Planned Surgery): In the event that you are scheduled by another doctor or dentist to have any type of surgery or procedure, you are allowed (for a period no longer than 30 days), to receive additional pain medication, for the acute post-op pain. However, in this case, you are responsible for picking up a copy of our "Post-op Pain Management for Surgeons" handout, and giving it to your surgeon or dentist. This document is available at our office, and does not require an appointment to obtain it. Simply go to our office during business hours (Monday-Thursday from 8:00 AM to 4:00 PM) (Friday 8:00 AM to 12:00 Noon) or if you have a scheduled appointment with us, prior to your surgery, and ask for it by name. In addition, you will need to provide us with your name, name of your surgeon, type of surgery, and date of procedure or surgery.  *Opioid medications include: morphine, codeine, oxycodone, oxymorphone, hydrocodone, hydromorphone, meperidine, tramadol, tapentadol, buprenorphine, fentanyl, methadone. **Benzodiazepine medications include: diazepam (Valium), alprazolam (Xanax), clonazepam (Klonopine), lorazepam (Ativan), clorazepate (Tranxene), chlordiazepoxide (Librium), estazolam (Prosom), oxazepam (Serax), temazepam (Restoril), triazolam (Halcion) (Last updated: 02/12/2018) ____________________________________________________________________________________________   ____________________________________________________________________________________________  Pain Scale  Introduction: The pain score used by this practice is the Verbal Numerical Rating Scale (VNRS-11). This is an 11-point scale. It is for adults and children 10 years or  older. There are significant differences in how the pain score is reported, used, and applied. Forget everything you learned in the past and learn this scoring system.  General Information: The scale should reflect your current level of pain. Unless you are specifically asked for the level of your worst pain, or your average pain. If you are asked for one of these two, then it should be understood that it is over the past 24 hours.  Basic Activities of Daily Living (ADL): Personal hygiene, dressing, eating, transferring, and using restroom.  Instructions: Most patients tend to report their level of pain as a combination of two factors, their physical pain and their psychosocial pain. This last one is also known as "suffering" and it is reflection of how physical pain affects you socially and psychologically. From now on, report them separately. From this point on, when asked to report your pain level, report only your physical pain. Use the following table for reference.  Pain Clinic Pain Levels (0-5/10)  Pain Level Score  Description  No Pain 0   Mild pain 1 Nagging, annoying, but does not interfere with basic activities of daily living (ADL). Patients are able to eat, bathe, get dressed, toileting (being able to get on and off the toilet and perform personal hygiene functions), transfer (move in and out of bed or a chair without assistance), and maintain continence (able to control bladder and bowel functions). Blood pressure and heart rate are unaffected. A normal heart rate for a healthy adult ranges from 60 to 100 bpm (beats per minute).   Mild to moderate pain 2 Noticeable and distracting. Impossible to hide from other people. More frequent flare-ups. Still possible to adapt and function close to normal. It can be very annoying and may have occasional stronger flare-ups. With discipline, patients may get used to it and adapt.   Moderate pain 3 Interferes significantly with activities of daily  living (ADL).   It becomes difficult to feed, bathe, get dressed, get on and off the toilet or to perform personal hygiene functions. Difficult to get in and out of bed or a chair without assistance. Very distracting. With effort, it can be ignored when deeply involved in activities.   Moderately severe pain 4 Impossible to ignore for more than a few minutes. With effort, patients may still be able to manage work or participate in some social activities. Very difficult to concentrate. Signs of autonomic nervous system discharge are evident: dilated pupils (mydriasis); mild sweating (diaphoresis); sleep interference. Heart rate becomes elevated (>115 bpm). Diastolic blood pressure (lower number) rises above 100 mmHg. Patients find relief in laying down and not moving.   Severe pain 5 Intense and extremely unpleasant. Associated with frowning face and frequent crying. Pain overwhelms the senses.  Ability to do any activity or maintain social relationships becomes significantly limited. Conversation becomes difficult. Pacing back and forth is common, as getting into a comfortable position is nearly impossible. Pain wakes you up from deep sleep. Physical signs will be obvious: pupillary dilation; increased sweating; goosebumps; brisk reflexes; cold, clammy hands and feet; nausea, vomiting or dry heaves; loss of appetite; significant sleep disturbance with inability to fall asleep or to remain asleep. When persistent, significant weight loss is observed due to the complete loss of appetite and sleep deprivation.  Blood pressure and heart rate becomes significantly elevated. Caution: If elevated blood pressure triggers a pounding headache associated with blurred vision, then the patient should immediately seek attention at an urgent or emergency care unit, as these may be signs of an impending stroke.    Emergency Department Pain Levels (6-10/10)  Emergency Room Pain 6 Severely limiting. Requires emergency care  and should not be seen or managed at an outpatient pain management facility. Communication becomes difficult and requires great effort. Assistance to reach the emergency department may be required. Facial flushing and profuse sweating along with potentially dangerous increases in heart rate and blood pressure will be evident.   Distressing pain 7 Self-care is very difficult. Assistance is required to transport, or use restroom. Assistance to reach the emergency department will be required. Tasks requiring coordination, such as bathing and getting dressed become very difficult.   Disabling pain 8 Self-care is no longer possible. At this level, pain is disabling. The individual is unable to do even the most "basic" activities such as walking, eating, bathing, dressing, transferring to a bed, or toileting. Fine motor skills are lost. It is difficult to think clearly.   Incapacitating pain 9 Pain becomes incapacitating. Thought processing is no longer possible. Difficult to remember your own name. Control of movement and coordination are lost.   The worst pain imaginable 10 At this level, most patients pass out from pain. When this level is reached, collapse of the autonomic nervous system occurs, leading to a sudden drop in blood pressure and heart rate. This in turn results in a temporary and dramatic drop in blood flow to the brain, leading to a loss of consciousness. Fainting is one of the body's self defense mechanisms. Passing out puts the brain in a calmed state and causes it to shut down for a while, in order to begin the healing process.    Summary: 1. Refer to this scale when providing us with your pain level. 2. Be accurate and careful when reporting your pain level. This will help with your care. 3. Over-reporting your pain level will lead to loss of credibility. 4. Even   a level of 1/10 means that there is pain and will be treated at our facility. 5. High, inaccurate reporting will be  documented as "Symptom Exaggeration", leading to loss of credibility and suspicions of possible secondary gains such as obtaining more narcotics, or wanting to appear disabled, for fraudulent reasons. 6. Only pain levels of 5 or below will be seen at our facility. 7. Pain levels of 6 and above will be sent to the Emergency Department and the appointment cancelled. ____________________________________________________________________________________________    

## 2018-07-08 NOTE — Progress Notes (Signed)
Patient's Name: Logan Vazquez  MRN: [id number]  Referring Provider: Virginia Crews, MD  DOB: 18-Feb-1946  PCP: Logan Crews, MD  DOS: 07/08/2018  Note by: Logan Francois NP  Service setting: Ambulatory outpatient  Specialty: Interventional Pain Management  Location: [facility] (AMB) Pain Management Facility    Patient type: Established    Primary Reason(s) for Visit: Encounter for prescription drug management. (Level of risk: moderate)  CC: Back Pain (lower bilateral )  HPI  Logan Vazquez is a 72 y.o. year old, male patient, who comes today for a medication management evaluation. He has Essential hypertension; Tobacco dependence; Abdominal aortic aneurysm (AAA) without rupture ([place]); Back pain; Depression, recurrent ([place]); COPD (chronic obstructive pulmonary disease) ([place]); Anxiety disorder; Atherosclerotic heart disease of native coronary artery without angina pectoris; History of CVA (cerebrovascular accident); Acquired right foot drop; History of lumbar spinal fusion; Lumbar degenerative disc disease; At high risk for falls; Chronic nausea; Chronic prescription opiate use; Descending thoracic aortic aneurysm ([place]); Enthesopathy of knee; GERD (gastroesophageal reflux disease); Ischemic stroke ([place]); Major depressive disorder, single episode; Pain medication agreement; Recurrent major depressive disorder, in partial remission ([place]); Stable angina ([place]); Tobacco use disorder; Chronic pain syndrome; Long term current use of opiate analgesic; and Chronic bilateral low back pain with bilateral sciatica on their problem list. His primarily concern today is the Back Pain (lower bilateral )  Pain Assessment: Location: Lower, Left, Right Back Radiating: down both legs  Onset: More than a month ago Duration: Chronic pain Quality: Discomfort, Constant, Aching, Throbbing Severity: 6 /10 (subjective, self-reported pain score)  Note: Reported level is compatible with observation. Clinically the patient  looks like a 2/10 A 2/10 is viewed as "Mild to Moderate" and described as noticeable and distracting. Impossible to hide from other people. More frequent flare-ups. Still possible to adapt and function close to normal. It can be very annoying and may have occasional stronger flare-ups. With discipline, patients may get used to it and adapt. Information on the proper use of the pain scale provided to the patient today. When using our objective Pain Scale, levels between 6 and 10/10 are said to belong in an emergency room, as it progressively worsens from a 6/10, described as severely limiting, requiring emergency care not usually available at an outpatient pain management facility. At a 6/10 level, communication becomes difficult and requires great effort. Assistance to reach the emergency department may be required. Facial flushing and profuse sweating along with potentially dangerous increases in heart rate and blood pressure will be evident. Effect on ADL: limiting Timing: Constant Modifying factors: medications takes the pain down a little bit but not all the way. BP: 112/79  HR: 91  Logan Vazquez was last scheduled for an appointment on Visit date not found for medication management. During today's appointment we reviewed Logan Vazquez chronic pain status, as well as his outpatient medication regimen. He has pain down into his ankles. He has right foot problems from stroke. He admits that the left foot tinglings all the time. He admits that the pain goes down the side of his leg. He admits that he sometimes has to take 4 tab per day secondary to the pain.He admits that it is better now. He is at a 6. He denies any new concerns. He deneis any side effects of his medication.   The patient  reports that he does not use drugs. His body mass index is 22.71 kg/m.  Further details on both, my assessment(s), as well as  the proposed treatment plan, please see below.  Controlled Substance Pharmacotherapy  Assessment REMS (Risk Evaluation and Mitigation Strategy)  Analgesic: Percocet 10 mg 3 times daily as needed, quantity 90/month MME/day: 45 mg/day.    Logan Billow, RN  07/08/2018  1:09 PM  Sign at close encounter Nursing Pain Medication Assessment:  Safety precautions to be maintained throughout the outpatient stay will include: orient to surroundings, keep bed in low position, maintain call bell within reach at all times, provide assistance with transfer out of bed and ambulation.  Medication Inspection Compliance: Pill count conducted under aseptic conditions, in front of the patient. Neither the pills nor the bottle was removed from the patient's sight at any time. Once count was completed pills were immediately returned to the patient in their original bottle.  Medication: Oxycodone/APAP Pill/Patch Count: 5 of 90 pills remain Pill/Patch Appearance: Markings consistent with prescribed medication Bottle Appearance: Standard pharmacy container. Clearly labeled. Filled Date: 07 / 03 / 2019 Last Medication intake:  Today   Pharmacokinetics: Liberation and absorption (onset of action): Shorter than expected. Distribution (time to peak effect): Shorter than expected. Metabolism and excretion (duration of action): WNL         Pharmacodynamics: Desired effects: Analgesia: Mr. Vazquez reports >50% benefit. Functional ability: Patient reports that medication allows him to accomplish basic ADLs Clinically meaningful improvement in function (CMIF): Sustained CMIF goals met Perceived effectiveness: Described as relatively effective, allowing for increase in activities of daily living (ADL) Undesirable effects: Side-effects or Adverse reactions: None reported Monitoring: [place] PMP: Online review of the past 5-monthperiod conducted. Compliant with practice rules and regulations Last UDS on record: Summary  Date Value Ref Range Status  03/10/2018 FINAL  Final    Comment:     ==================================================================== TOXASSURE COMP DRUG ANALYSIS,UR ==================================================================== Test                             Result       Flag       Units Drug Present and Declared for Prescription Verification   Pregabalin                     PRESENT      EXPECTED   Duloxetine                     PRESENT      EXPECTED Drug Present not Declared for Prescription Verification   Mirtazapine                    PRESENT      UNEXPECTED Drug Absent but Declared for Prescription Verification   Salicylate                     Not Detected UNEXPECTED    Aspirin, as indicated in the declared medication list, is not    always detected even when used as directed. ==================================================================== Test                      Result    Flag   Units      Ref Range   Creatinine              159              mg/dL      >=20 ==================================================================== Declared Medications:  The flagging and interpretation on this report are based  on the  following declared medications.  Unexpected results may arise from  inaccuracies in the declared medications.  **Note: The testing scope of this panel includes these medications:  Duloxetine  Pregabalin  **Note: The testing scope of this panel does not include small to  moderate amounts of these reported medications:  Aspirin (Aspirin 81)  **Note: The testing scope of this panel does not include following  reported medications:  Albuterol  Atorvastatin  Cholecalciferol  Furosemide  Lisinopril  Nicotine  Nitroglycerin (NTG)  Omeprazole  Vitamin B12 ==================================================================== For clinical consultation, please call [phone]. ====================================================================    UDS interpretation: Compliant          Medication Assessment Form:  Reviewed. Patient indicates being compliant with therapy Treatment compliance: Deficiencies noted and steps taken to remind the patient of the seriousness of adequate therapy compliance Risk Assessment Profile: Aberrant behavior: non-adherence to medication regimen and taking more medication than prescribed Comorbid factors increasing risk of overdose: caucasian and male gender Risk of substance use disorder (SUD): Moderate Opioid Risk Tool - 05/14/18 1339      Family History of Substance Abuse   Alcohol  Negative    Illegal Drugs  Negative    Rx Drugs  Negative      Personal History of Substance Abuse   Alcohol  Negative    Illegal Drugs  Negative    Rx Drugs  Negative      Age   Age between 21-45 years   No      History of Preadolescent Sexual Abuse   History of Preadolescent Sexual Abuse  Negative or Male      Psychological Disease   Psychological Disease  Negative    Depression  Positive      Total Score   Opioid Risk Tool Scoring  1    Opioid Risk Interpretation  Low Risk      ORT Scoring interpretation table:  Score <3 = Low Risk for SUD  Score between 4-7 = Moderate Risk for SUD  Score >8 = High Risk for Opioid Abuse   Risk Mitigation Strategies:  Patient Counseling: Covered Patient-Prescriber Agreement (PPA): Present and active  Notification to other healthcare providers: Done  Pharmacologic Plan: No change in therapy, at this time.             Laboratory Chemistry  Inflammation Markers (CRP: Acute Phase) (ESR: Chronic Phase) No results found for: CRP, ESRSEDRATE, LATICACIDVEN                       Rheumatology Markers No results found for: RF, ANA, LABURIC, URICUR, LYMEIGGIGMAB, LYMEABIGMQN, HLAB27                      Renal Function Markers Lab Results  Component Value Date   BUN 15 10/23/2017   CREATININE 0.92 77/93/9030   BCR NOT APPLICABLE 09/07/3006                             Hepatic Function Markers Lab Results  Component Value Date    AST 14 10/23/2017   ALT 10 10/23/2017                        Electrolytes Lab Results  Component Value Date   NA 138 10/23/2017   K 4.9 10/23/2017   CL 102 10/23/2017   CALCIUM 9.5 10/23/2017  Neuropathy Markers No results found for: VITAMINB12, FOLATE, HGBA1C, HIV                      Bone Pathology Markers No results found for: VD25OH, [id number], [id number], BW3893TD4, 25OHVITD1, 25OHVITD2, 25OHVITD3, TESTOFREE, TESTOSTERONE                       Coagulation Parameters Lab Results  Component Value Date   PLT 349 10/23/2017                        Cardiovascular Markers Lab Results  Component Value Date   HGB 14.7 10/23/2017   HCT 43.1 10/23/2017                         CA Markers No results found for: CEA, CA125, LABCA2                      Note: Lab results reviewed.  Recent Diagnostic Imaging Results  MR LUMBAR SPINE WO CONTRAST CLINICAL DATA:  Previous fusion in 1985. Now with pain in the hips and legs. Numbness in the legs.  EXAM: MRI LUMBAR SPINE WITHOUT CONTRAST  TECHNIQUE: Multiplanar, multisequence MR imaging of the lumbar spine was performed. No intravenous contrast was administered.  COMPARISON:  None.  FINDINGS: Segmentation: I do not have plain films for correlation. L5-S1 interspace appears hypoplastic. There may be transitional anatomy.  Alignment: Straightening of the normal lumbar lordosis due to prior fusion L3-S1.  Vertebrae: Chronic T12 compression fracture. The patient has undergone some type of posterior fixation from L3 through S1. There is extensive metallic artifact. I do not have plain film correlates. No pedicle screws are placed. There appears to be solid interbody arthrodesis.  Conus medullaris and cauda equina: Conus extends to the L1 level. Conus and cauda equina appear normal.  Paraspinal and other soft tissues: Renal cystic disease, incompletely evaluated. Abdominal aortic aneurysm, 43 by 43  mm cross-section, with some mural thrombus.  Disc levels:  L1-L2: Disc desiccation and loss of disc height. Central protrusion. Posterior element hypertrophy. Moderate to severe stenosis. BILATERAL subarticular zone narrowing results in L2 nerve root impingement. BILATERAL foraminal narrowing, not clearly compressive.  L2-L3: Disc desiccation, slight loss of disc height. Central disc extrusion. Posterior element hypertrophy. Severe stenosis, probable near complete block with crowding of the cauda equina nerve roots centrally. Severe BILATERAL L3 nerve root impingement. BILATERAL foraminal narrowing likely affects both L2 nerve roots.  L3-L4:  Post fusion interspace, grossly unremarkable.  L4-L5:  Post fusion interspace, grossly unremarkable.  L5-S1:  Post fusion interspace, grossly unremarkable.  IMPRESSION: Solid appearing L3-S1 arthrodesis. Some type of metallic posterior fixation has been performed.  Severe stenosis at L2-3 related to posterior element hypertrophy and central disc extrusion. BILATERAL L3 and L2 nerve root impingement are likely.  Moderate to severe stenosis L1-2, central protrusion with posterior element hypertrophy, with BILATERAL L2 nerve root impingement.  4.3 cm abdominal aortic aneurysm. Some mural thrombus is present. Recommend followup by [place] in 1 year. This recommendation follows ACR consensus guidelines: White Paper of the ACR Incidental Findings Committee II on Vascular Findings. J Am Coll Radiol 2013; 10:789-794.  Electronically Signed   By: Staci Righter M.D.   On: 03/05/2018 20:16  Complexity Note: Imaging results reviewed. Results shared with Mr. Rivet, using Layman's terms.  Meds   Current Outpatient Medications:  .  albuterol (PROVENTIL HFA;VENTOLIN HFA) 108 (90 Base) MCG/ACT inhaler, Inhale into the lungs., Disp: , Rfl:  .  aspirin EC 81 MG tablet, Take 81 mg by mouth., Disp: , Rfl:  .  atorvastatin  (LIPITOR) 40 MG tablet, Take 1 tablet (40 mg total) by mouth daily., Disp: 30 tablet, Rfl: 3 .  celecoxib (CELEBREX) 100 MG capsule, Take 1 capsule (100 mg total) by mouth 2 (two) times daily., Disp: 180 capsule, Rfl: 3 .  Cholecalciferol (VITAMIN D-1000 MAX ST) 1000 units tablet, Take by mouth., Disp: , Rfl:  .  Cyanocobalamin (B-12) 2000 MCG TABS, Take by mouth., Disp: , Rfl:  .  docusate sodium (COLACE) 100 MG capsule, Take 100 mg by mouth 2 (two) times daily., Disp: , Rfl:  .  FLUoxetine (PROZAC) 20 MG tablet, Take 1 tablet (20 mg total) by mouth daily., Disp: 30 tablet, Rfl: 1 .  furosemide (LASIX) 20 MG tablet, Take by mouth., Disp: , Rfl:  .  hydrocortisone (ANUSOL-HC) 2.5 % rectal cream, Place 1 application rectally 2 (two) times daily., Disp: 30 g, Rfl: 0 .  lisinopril (PRINIVIL,ZESTRIL) 40 MG tablet, Take 1 tablet (40 mg total) by mouth daily., Disp: 30 tablet, Rfl: 5 .  mupirocin cream (BACTROBAN) 2 %, Apply 1 application topically 2 (two) times daily., Disp: 15 g, Rfl: 0 .  nitroGLYCERIN (NITROSTAT) 0.4 MG SL tablet, Place under the tongue., Disp: , Rfl:  .  omeprazole (PRILOSEC) 20 MG capsule, Take 1 capsule (20 mg total) by mouth daily., Disp: 90 capsule, Rfl: 3 .  pregabalin (LYRICA) 75 MG capsule, Take 2 capsules (150 mg total) by mouth at bedtime as needed., Disp: 60 capsule, Rfl: 5 .  [START ON 08/16/2018] oxyCODONE-acetaminophen (PERCOCET) 10-325 MG tablet, Take 1 tablet by mouth every 8 (eight) hours as needed for pain. For chronic pain To last for 30 days from fill date, Disp: 90 tablet, Rfl: 0 .  [START ON 07/17/2018] oxyCODONE-acetaminophen (PERCOCET) 10-325 MG tablet, Take 1 tablet by mouth every 8 (eight) hours as needed for pain., Disp: 120 tablet, Rfl: 0  ROS  Constitutional: Denies any fever or chills Gastrointestinal: No reported hemesis, hematochezia, vomiting, or acute GI distress Musculoskeletal: Denies any acute onset joint swelling, redness, loss of ROM, or  weakness Neurological: No reported episodes of acute onset apraxia, aphasia, dysarthria, agnosia, amnesia, paralysis, loss of coordination, or loss of consciousness  Allergies  Mr. Preslar is allergic to ibuprofen.  [place]  Drug: Mr. Bielinski  reports that he does not use drugs. Alcohol:  reports that he does not drink alcohol. Tobacco:  reports that he has been smoking cigarettes.  He has a 14.00 pack-year smoking history. He has never used smokeless tobacco. Medical:  has a past medical history of Arthritis, Asthma, CAD (coronary artery disease), Depression, Emphysema lung ([place]), Glaucoma, and Hypertension. Surgical: Mr. Buchta  has a past surgical history that includes heart surgery (07/1999); Hip surgery (Left, 1968); Back surgery (1985); Skin graft (1968); and Humerus fracture surgery (1968). Family: family history includes Cancer in his father; Hearing loss in his mother; Heart attack in his brother; Heart disease in his brother; Hypertension in his father and mother; Stroke in his sister.  Constitutional Exam  General appearance: Well nourished, well developed, and well hydrated. In no apparent acute distress Vitals:   07/08/18 1258  BP: 112/79  Pulse: 91  Resp: 16  Temp: 98.4 F (36.9 C)  TempSrc: Oral  SpO2: 97%  Weight: 145 lb (65.8 kg)  Height: 5' 7"  (1.702 m)  Psych/Mental status: Alert, oriented x 3 (person, place, & time)       Eyes: PERLA Respiratory: No evidence of acute respiratory distress    Lumbar Spine Area Exam  Skin & Axial Inspection: No masses, redness, or swelling Alignment: Scoliosis detected Functional ROM: Decreased ROM       Stability: No instability detected Muscle Tone/Strength: Functionally intact. No obvious neuro-muscular anomalies detected. Sensory (Neurological): Unimpaired Palpation: Complains of area being tender to palpation       Provocative Tests: Lumbar Hyperextension/rotation test: deferred today       Lumbar quadrant test (Kemp's  test): deferred today       Lumbar Lateral bending test: deferred today        Gait & Posture Assessment  Ambulation: Patient ambulates using a walker Gait: Antalgic gait (limping) Posture: Poor   Lower Extremity Exam    Side: Right lower extremity  Side: Left lower extremity  Stability: No instability observed          Stability: No instability observed          Skin & Extremity Inspection:   Skin & Extremity Inspection: Evidence of prior arthroplastic surgery  Functional ROM: Unrestricted ROM                  Functional ROM: Unrestricted ROM                  Muscle Tone/Strength: Foot drop (L5 vs Deep Peroneal)  Muscle Tone/Strength: Generalized lower extremity weakness  Sensory (Neurological): Unimpaired  Sensory (Neurological): Unimpaired  Palpation: No palpable anomalies  Palpation: Complains of area being tender to palpation   Assessment  Primary Diagnosis & Pertinent Problem List: The primary encounter diagnosis was Chronic bilateral low back pain with bilateral sciatica. Diagnoses of Lumbar degenerative disc disease, Chronic pain syndrome, and Long term current use of opiate analgesic were also pertinent to this visit.  Status Diagnosis  Persistent Persistent Controlled 1. Chronic bilateral low back pain with bilateral sciatica   2. Lumbar degenerative disc disease   3. Chronic pain syndrome   4. Long term current use of opiate analgesic     Problems updated and reviewed during this visit: Problem  Long Term Current Use of Opiate Analgesic  Chronic Bilateral Low Back Pain With Bilateral Sciatica  Chronic Pain Syndrome  Ischemic Stroke ([facility])  Pain Medication Agreement   Overview:  Patient signed pain contract with [facility] on December 17, 2016 and is only to receive opiate medications from [facility].   Abdominal Aortic Aneurysm (Aaa) Without Rupture ([facility])  Descending Thoracic Aortic Aneurysm ([facility])  Enthesopathy of Knee  Anxiety Disorder    Overview:  Overview:  Added to problem list as part of QI consistent with current medical treatment Overview:  Added to problem list as part of QI consistent with current medical treatment   Major Depressive Disorder, Single Episode   Overview:  Overview:  Added to problem list as part of QI consistent with current medical treatment   Recurrent Major Depressive Disorder, in Partial Remission ([facility])   Overview:  Added to problem list as part of QI consistent with current medical treatment   Chronic Nausea  Essential Hypertension  Chronic Prescription Opiate Use   Overview:  Overview:  High dose opiates for many years. Has been evaluated by  Pain management at [facility], appropriate to continue opiate pain medications. Pain contract signed. Does annual  urine tox. High risk given extremely high doses of opiates.  Overview:  High dose opiates for many years. Has been evaluated by  Pain management at [facility], appropriate to continue opiate pain medications. Pain contract signed. Does annual urine tox. High risk given extremely high doses of opiates.    Stable Angina ([facility])  At High Risk for Falls  Gerd (Gastroesophageal Reflux Disease)  Tobacco Use Disorder  Back Pain  Atherosclerotic Heart Disease of Native Coronary Artery Without Angina Pectoris   Overview:  Overview:  Last cardiac catherization was 07/1999 at which time Ostial RCA DES and Proximal RCA DES placed.   Overview:  Last cardiac catherization was 07/1999 at which time Ostial RCA DES and Proximal RCA DES placed.      Plan of Care  Pharmacotherapy (Medications Ordered): Meds ordered this encounter  Medications  . oxyCODONE-acetaminophen (PERCOCET) 10-325 MG tablet    Sig: Take 1 tablet by mouth every 8 (eight) hours as needed for pain. For chronic pain To last for 30 days from fill date    Dispense:  90 tablet    Refill:  0    Do not place this medication, or any other prescription from our practice, on "Automatic Refill".  Patient may have prescription filled one day early if pharmacy is closed on scheduled refill date.  To fill on or after:08/16/2018    Order Specific Question:   Supervising Provider    Answer:   Milinda Pointer [phone]  . oxyCODONE-acetaminophen (PERCOCET) 10-325 MG tablet    Sig: Take 1 tablet by mouth every 8 (eight) hours as needed for pain.    Dispense:  120 tablet    Refill:  0    Do not place this medication, or any other prescription from our practice, on "Automatic Refill". Patient may have prescription filled one day early if pharmacy is closed on scheduled refill date. Do not fill until: 07/17/2018 To last until:08/16/2018    Order Specific Question:   Supervising Provider    Answer:   Milinda Pointer [phone]   New Prescriptions   OXYCODONE-ACETAMINOPHEN (PERCOCET) 10-325 MG TABLET    Take 1 tablet by mouth every 8 (eight) hours as needed for pain.   Medications administered today: Ranson L. Costlow had no medications administered during this visit. Lab-work, procedure(s), and/or referral(s): Orders Placed This Encounter  Procedures  . Drug Screen 10 W/Conf, Serum   Imaging and/or referral(s): None  Interventional therapies: Planned, scheduled, and/or pending:   Not at this time.   Provider-requested follow-up: Return in about 2 months (around 09/08/2018).  Future Appointments  Date Time Provider [place]  07/10/2018  1:20 PM Logan Crews, MD [facility] None  09/08/2018  1:45 PM Gillis Santa, MD [facility] None  04/09/2019 10:30 AM [facility] V[facility] 2 [facility] None  04/09/2019 11:30 AM Dew, Erskine Squibb, MD [facility] None   Primary Care Physician: Logan Crews, MD Location: [facility] Management Facility Note by: Logan Francois NP Date: 07/08/2018; Time: 5:04 PM  Pain Score Disclaimer: We use the NRS-11 scale. This is a self-reported, subjective measurement of pain severity with only modest accuracy. It is used primarily to identify changes  within a particular patient. It must be understood that outpatient pain scales are significantly less accurate that those used for research, where they can be applied under ideal controlled circumstances with minimal exposure to variables. In reality, the score is likely to be a combination of pain intensity and pain affect, where pain affect describes the  degree of emotional arousal or changes in action readiness caused by the sensory experience of pain. Factors such as social and work situation, setting, emotional state, anxiety levels, expectation, and prior pain experience may influence pain perception and show large inter-individual differences that may also be affected by time variables.  Patient instructions provided during this appointment: Patient Instructions   ____________________________________________________________________________________________  Medication Rules  Applies to: All patients receiving prescriptions (written or electronic).  Pharmacy of record: Pharmacy where electronic prescriptions will be sent. If written prescriptions are taken to a different pharmacy, please inform the nursing staff. The pharmacy listed in the electronic medical record should be the one where you would like electronic prescriptions to be sent.  Prescription refills: Only during scheduled appointments. Applies to both, written and electronic prescriptions.  NOTE: The following applies primarily to controlled substances (Opioid* Pain Medications).   Patient's responsibilities: 1. Pain Pills: Bring all pain pills to every appointment (except for procedure appointments). 2. Pill Bottles: Bring pills in original pharmacy bottle. Always bring newest bottle. Bring bottle, even if empty. 3. Medication refills: You are responsible for knowing and keeping track of what medications you need refilled. The day before your appointment, write a list of all prescriptions that need to be refilled. Bring that  list to your appointment and give it to the admitting nurse. Prescriptions will be written only during appointments. If you forget a medication, it will not be "Called in", "Faxed", or "electronically sent". You will need to get another appointment to get these prescribed. 4. Prescription Accuracy: You are responsible for carefully inspecting your prescriptions before leaving our office. Have the discharge nurse carefully go over each prescription with you, before taking them home. Make sure that your name is accurately spelled, that your address is correct. Check the name and dose of your medication to make sure it is accurate. Check the number of pills, and the written instructions to make sure they are clear and accurate. Make sure that you are given enough medication to last until your next medication refill appointment. 5. Taking Medication: Take medication as prescribed. Never take more pills than instructed. Never take medication more frequently than prescribed. Taking less pills or less frequently is permitted and encouraged, when it comes to controlled substances (written prescriptions).  6. Inform other Doctors: Always inform, all of your healthcare providers, of all the medications you take. 7. Pain Medication from other Providers: You are not allowed to accept any additional pain medication from any other Doctor or Healthcare provider. There are two exceptions to this rule. (see below) In the event that you require additional pain medication, you are responsible for notifying us, as stated below. 8. Medication Agreement: You are responsible for carefully reading and following our Medication Agreement. This must be signed before receiving any prescriptions from our practice. Safely store a copy of your signed Agreement. Violations to the Agreement will result in no further prescriptions. (Additional copies of our Medication Agreement are available upon request.) 9. Laws, Rules, & Regulations: All  patients are expected to follow all Federal and [employer], [employer], Rules, [employer]. Ignorance of the Laws does not constitute a valid excuse. The use of any illegal substances is prohibited. 10. Adopted CDC guidelines & recommendations: Target dosing levels will be at or below 60 MME/day. Use of benzodiazepines** is not recommended.  Exceptions: There are only two exceptions to the rule of not receiving pain medications from other Healthcare Providers. 1. Exception #1 (Emergencies): In the event of  an emergency (i.e.: accident requiring emergency care), you are allowed to receive additional pain medication. However, you are responsible for: As soon as you are able, call our office [phone], at any time of the day or night, and leave a message stating your name, the date and nature of the emergency, and the name and dose of the medication prescribed. In the event that your call is answered by a member of our staff, make sure to document and save the date, time, and the name of the person that took your information.  2. Exception #2 (Planned Surgery): In the event that you are scheduled by another doctor or dentist to have any type of surgery or procedure, you are allowed (for a period no longer than 30 days), to receive additional pain medication, for the acute post-op pain. However, in this case, you are responsible for picking up a copy of our "Post-op Pain Management for Surgeons" handout, and giving it to your surgeon or dentist. This document is available at our office, and does not require an appointment to obtain it. Simply go to our office during business hours (Monday-Thursday from 8:00 AM to 4:00 PM) (Friday 8:00 AM to 12:00 Noon) or if you have a scheduled appointment with [place], prior to your surgery, and ask for it by name. In addition, you will need to provide [place] with your name, name of your surgeon, type of surgery, and date of procedure or surgery.  *Opioid medications include:  morphine, codeine, oxycodone, oxymorphone, hydrocodone, hydromorphone, meperidine, tramadol, tapentadol, buprenorphine, fentanyl, methadone. **Benzodiazepine medications include: diazepam (Valium), alprazolam (Xanax), clonazepam (Klonopine), lorazepam (Ativan), clorazepate (Tranxene), chlordiazepoxide (Librium), estazolam (Prosom), oxazepam (Serax), temazepam (Restoril), triazolam (Halcion) (Last updated: 02/12/2018) ____________________________________________________________________________________________   ____________________________________________________________________________________________  Pain Scale  Introduction: The pain score used by this practice is the Verbal Numerical Rating Scale (VNRS-11). This is an 11-point scale. It is for adults and children 10 years or older. There are significant differences in how the pain score is reported, used, and applied. Forget everything you learned in the past and learn this scoring system.  General Information: The scale should reflect your current level of pain. Unless you are specifically asked for the level of your worst pain, or your average pain. If you are asked for one of these two, then it should be understood that it is over the past 24 hours.  Basic Activities of Daily Living (ADL): Personal hygiene, dressing, eating, transferring, and using restroom.  Instructions: Most patients tend to report their level of pain as a combination of two factors, their physical pain and their psychosocial pain. This last one is also known as "suffering" and it is reflection of how physical pain affects you socially and psychologically. From now on, report them separately. From this point on, when asked to report your pain level, report only your physical pain. Use the following table for reference.  Pain Clinic Pain Levels (0-5/10)  Pain Level Score  Description  No Pain 0   Mild pain 1 Nagging, annoying, but does not interfere with basic  activities of daily living (ADL). Patients are able to eat, bathe, get dressed, toileting (being able to get on and off the toilet and perform personal hygiene functions), transfer (move in and out of bed or a chair without assistance), and maintain continence (able to control bladder and bowel functions). Blood pressure and heart rate are unaffected. A normal heart rate for a healthy adult ranges from 60 to 100 bpm (beats per minute).  Mild to moderate pain 2 Noticeable and distracting. Impossible to hide from other people. More frequent flare-ups. Still possible to adapt and function close to normal. It can be very annoying and may have occasional stronger flare-ups. With discipline, patients may get used to it and adapt.   Moderate pain 3 Interferes significantly with activities of daily living (ADL). It becomes difficult to feed, bathe, get dressed, get on and off the toilet or to perform personal hygiene functions. Difficult to get in and out of bed or a chair without assistance. Very distracting. With effort, it can be ignored when deeply involved in activities.   Moderately severe pain 4 Impossible to ignore for more than a few minutes. With effort, patients may still be able to manage work or participate in some social activities. Very difficult to concentrate. Signs of autonomic nervous system discharge are evident: dilated pupils (mydriasis); mild sweating (diaphoresis); sleep interference. Heart rate becomes elevated (>115 bpm). Diastolic blood pressure (lower number) rises above 100 mmHg. Patients find relief in laying down and not moving.   Severe pain 5 Intense and extremely unpleasant. Associated with frowning face and frequent crying. Pain overwhelms the senses.  Ability to do any activity or maintain social relationships becomes significantly limited. Conversation becomes difficult. Pacing back and forth is common, as getting into a comfortable position is nearly impossible. Pain wakes you  up from deep sleep. Physical signs will be obvious: pupillary dilation; increased sweating; goosebumps; brisk reflexes; cold, clammy hands and feet; nausea, vomiting or dry heaves; loss of appetite; significant sleep disturbance with inability to fall asleep or to remain asleep. When persistent, significant weight loss is observed due to the complete loss of appetite and sleep deprivation.  Blood pressure and heart rate becomes significantly elevated. Caution: If elevated blood pressure triggers a pounding headache associated with blurred vision, then the patient should immediately seek attention at an urgent or emergency care unit, as these may be signs of an impending stroke.    Emergency Department Pain Levels (6-10/10)  Emergency Room Pain 6 Severely limiting. Requires emergency care and should not be seen or managed at an outpatient pain management facility. Communication becomes difficult and requires great effort. Assistance to reach the emergency department may be required. Facial flushing and profuse sweating along with potentially dangerous increases in heart rate and blood pressure will be evident.   Distressing pain 7 Self-care is very difficult. Assistance is required to transport, or use restroom. Assistance to reach the emergency department will be required. Tasks requiring coordination, such as bathing and getting dressed become very difficult.   Disabling pain 8 Self-care is no longer possible. At this level, pain is disabling. The individual is unable to do even the most "basic" activities such as walking, eating, bathing, dressing, transferring to a bed, or toileting. Fine motor skills are lost. It is difficult to think clearly.   Incapacitating pain 9 Pain becomes incapacitating. Thought processing is no longer possible. Difficult to remember your own name. Control of movement and coordination are lost.   The worst pain imaginable 10 At this level, most patients pass out from pain.  When this level is reached, collapse of the autonomic nervous system occurs, leading to a sudden drop in blood pressure and heart rate. This in turn results in a temporary and dramatic drop in blood flow to the brain, leading to a loss of consciousness. Fainting is one of the body's self defense mechanisms. Passing out puts the brain in a calmed state  and causes it to shut down for a while, in order to begin the healing process.    Summary: 1. Refer to this scale when providing [place] with your pain level. 2. Be accurate and careful when reporting your pain level. This will help with your care. 3. Over-reporting your pain level will lead to loss of credibility. 4. Even a level of 1/10 means that there is pain and will be treated at our facility. 5. High, inaccurate reporting will be documented as "Symptom Exaggeration", leading to loss of credibility and suspicions of possible secondary gains such as obtaining more narcotics, or wanting to appear disabled, for fraudulent reasons. 6. Only pain levels of 5 or below will be seen at our facility. 7. Pain levels of 6 and above will be sent to the Emergency Department and the appointment cancelled. ____________________________________________________________________________________________

## 2018-07-08 NOTE — Progress Notes (Signed)
Nursing Pain Medication Assessment:  Safety precautions to be maintained throughout the outpatient stay will include: orient to surroundings, keep bed in low position, maintain call bell within reach at all times, provide assistance with transfer out of bed and ambulation.  Medication Inspection Compliance: Pill count conducted under aseptic conditions, in front of the patient. Neither the pills nor the bottle was removed from the patient's sight at any time. Once count was completed pills were immediately returned to the patient in their original bottle.  Medication: Oxycodone/APAP Pill/Patch Count: 5 of 90 pills remain Pill/Patch Appearance: Markings consistent with prescribed medication Bottle Appearance: Standard pharmacy container. Clearly labeled. Filled Date: 07 / 03 / 2019 Last Medication intake:  Today

## 2018-07-10 ENCOUNTER — Ambulatory Visit: Payer: Medicare HMO | Admitting: Family Medicine

## 2018-07-16 LAB — DRUG SCREEN 10 W/CONF, SERUM
Amphetamines, IA: NEGATIVE ng/mL
Barbiturates, IA: NEGATIVE ug/mL
Benzodiazepines, IA: NEGATIVE ng/mL
Cocaine & Metabolite, IA: NEGATIVE ng/mL
Methadone, IA: NEGATIVE ng/mL
Opiates, IA: NEGATIVE ng/mL
Oxycodones, IA: POSITIVE ng/mL — AB
Phencyclidine, IA: NEGATIVE ng/mL
Propoxyphene, IA: NEGATIVE ng/mL
THC(Marijuana) Metabolite, IA: NEGATIVE ng/mL

## 2018-07-16 LAB — OXYCODONES,MS,WB/SP RFX
Oxycocone: 9.9 ng/mL
Oxycodones Confirmation: POSITIVE
Oxymorphone: NEGATIVE ng/mL

## 2018-07-22 ENCOUNTER — Telehealth: Payer: Self-pay | Admitting: Family Medicine

## 2018-07-22 ENCOUNTER — Other Ambulatory Visit: Payer: Self-pay | Admitting: Family Medicine

## 2018-07-22 MED ORDER — FLUOXETINE HCL 20 MG PO TABS: 20.0000 mg | ORAL_TABLET | Freq: Every day | ORAL | 1 refills | Status: DC

## 2018-07-22 NOTE — Telephone Encounter (Signed)
Yes. Cymbalta was stopped and prozac started at last visit.  Erasmo Downer, MD, MPH [facility] 07/22/2018 4:35 PM

## 2018-07-22 NOTE — Telephone Encounter (Signed)
Please review

## 2018-07-22 NOTE — Telephone Encounter (Signed)
Pharmacist advised

## 2018-07-22 NOTE — Telephone Encounter (Signed)
Hospital doctor at [facility] needs a clarification on Pt's Duloxetine has been discontinue and replaced with the Fluoxetine.  Right now they have an order for both  CB# [phone]   Thanks teri

## 2018-07-22 NOTE — Telephone Encounter (Signed)
pt needs  A refill on his Prozac  [facility]  CB# [phone]  Thanks Barth Kirks

## 2018-07-27 ENCOUNTER — Other Ambulatory Visit: Payer: Self-pay

## 2018-07-27 ENCOUNTER — Ambulatory Visit: Payer: Medicare HMO | Admitting: Family Medicine

## 2018-07-27 ENCOUNTER — Encounter: Payer: Self-pay | Admitting: Family Medicine

## 2018-07-27 MED ORDER — HYDROCORTISONE 2.5 % RE CREA: 1.0000 "application " | TOPICAL_CREAM | Freq: Two times a day (BID) | RECTAL | 0 refills | Status: DC

## 2018-07-27 NOTE — Telephone Encounter (Signed)
Please review

## 2018-07-27 NOTE — Telephone Encounter (Signed)
OK to send one month refill of Anusol.  Of note, patient has had 3 no shows and discharge letter is going out to him today.  Erasmo Downer, MD, MPH [facility] 07/27/2018 2:07 PM

## 2018-07-27 NOTE — Telephone Encounter (Signed)
Refill sent.

## 2018-07-27 NOTE — Telephone Encounter (Signed)
Patient is requesting a refill on hydrocortisone (ANUSOL-HC) 2.5 % rectal cream be sent to [facility]. Patient had a appointment scheduled today at 1:20 but his transportation did not show up. CB# [phone]

## 2018-08-03 ENCOUNTER — Other Ambulatory Visit: Payer: Self-pay | Admitting: Family Medicine

## 2018-08-19 ENCOUNTER — Telehealth: Payer: Self-pay | Admitting: Family Medicine

## 2018-08-19 NOTE — Telephone Encounter (Signed)
What medications does he need a refill on?  Technically per dismissal policy, he can be seen for same day issues only for 30 days after dismissal letter.  Shouldn't be seen for follow-up issues.  We can refill medication x1 month though.  Erasmo Downer, MD, MPH [facility] 08/19/2018 4:51 PM

## 2018-08-19 NOTE — Telephone Encounter (Signed)
Pt called to schedule follow up for medication refill. Pt was advised of dismissal letter that was attempted to be sent certified mail but was returned and advised that we have sent via regular mail. Pt confirmed we have his address correct. Pt stated that he needed to discuss his medications with Dr. Leonard Schwartz and scheduled appt for 08/26/18. Pt stated he understood that we would continue to provide care for the next 30 days and pt stated he would start looking for another PCP during that time. Please advise. Thanks TNP

## 2018-08-20 NOTE — Telephone Encounter (Signed)
LMTCB

## 2018-08-26 ENCOUNTER — Ambulatory Visit: Payer: Medicare HMO | Admitting: Family Medicine

## 2018-08-27 ENCOUNTER — Other Ambulatory Visit: Payer: Self-pay | Admitting: Family Medicine

## 2018-08-27 MED ORDER — HYDROCORTISONE 2.5 % RE CREA: 1.0000 "application " | TOPICAL_CREAM | Freq: Two times a day (BID) | RECTAL | 0 refills | Status: DC

## 2018-08-27 NOTE — Telephone Encounter (Signed)
[facility] pharmacy faxed a refill request for the following medication. Thanks CC  hydrocortisone (ANUSOL-HC) 2.5 % rectal cream

## 2018-08-28 ENCOUNTER — Telehealth: Payer: Self-pay | Admitting: Family Medicine

## 2018-08-28 NOTE — Telephone Encounter (Signed)
error 

## 2018-09-02 ENCOUNTER — Telehealth: Payer: Self-pay | Admitting: Family Medicine

## 2018-09-02 NOTE — Telephone Encounter (Signed)
Attempted to contact patient's sister Samson Frederic. No answer or voicemail.

## 2018-09-02 NOTE — Telephone Encounter (Signed)
I understand that it is difficult for patient to make it to appointments, but he was warned about no show policy prior to dismissal.  It is unfair to other patients when someone no shows frequently as they are not able to be seen.  He was given a 30 day notice about dismissal so that he would have time to find another PCP.  We can provide a 1 onth refill of any medications if needed.  Erasmo Downer, MD, MPH [facility] 09/02/2018 10:38 AM

## 2018-09-02 NOTE — Telephone Encounter (Signed)
Pt's sister asking why pt cannot be seen by Dr. Leonard Schwartz. Pt was told it was because of appointments missed. Sister states it's because doesn't have transportation. Pt's sister is concerned because pt is on medications -Prozac.  Please advise.  Thanks [employer]

## 2018-09-03 NOTE — Telephone Encounter (Signed)
Patients sister Samson Frederic advised and verbally voiced understanding. She says that patient needs a refill on Prozac and Lisinopril sent to [facility].

## 2018-09-03 NOTE — Telephone Encounter (Signed)
Pt's returned missed call.  Please call her back to discuss pt. Pt called at the same time as sister requesting a refill on Prozac.  Please advise.  Thanks, [employer]

## 2018-09-04 MED ORDER — LISINOPRIL 40 MG PO TABS: 40.0000 mg | ORAL_TABLET | Freq: Every day | ORAL | 0 refills | Status: DC

## 2018-09-04 MED ORDER — FLUOXETINE HCL 20 MG PO TABS: 20.0000 mg | ORAL_TABLET | Freq: Every day | ORAL | 0 refills | Status: DC

## 2018-09-04 NOTE — Telephone Encounter (Signed)
Prescription sent  Erasmo Downer, MD, MPH [facility] 09/04/2018 9:41 AM

## 2018-09-08 ENCOUNTER — Other Ambulatory Visit: Payer: Self-pay

## 2018-09-08 ENCOUNTER — Ambulatory Visit
Payer: Medicare HMO | Attending: Student in an Organized Health Care Education/Training Program | Admitting: Student in an Organized Health Care Education/Training Program

## 2018-09-08 ENCOUNTER — Encounter: Payer: Self-pay | Admitting: Student in an Organized Health Care Education/Training Program

## 2018-09-08 VITALS — Ht 69.0 in | Wt 154.0 lb

## 2018-09-08 DIAGNOSIS — G8929 Other chronic pain: Secondary | ICD-10-CM

## 2018-09-08 DIAGNOSIS — F17210 Nicotine dependence, cigarettes, uncomplicated: Secondary | ICD-10-CM | POA: Diagnosis not present

## 2018-09-08 DIAGNOSIS — M25551 Pain in right hip: Secondary | ICD-10-CM | POA: Insufficient documentation

## 2018-09-08 DIAGNOSIS — M21371 Foot drop, right foot: Secondary | ICD-10-CM | POA: Diagnosis not present

## 2018-09-08 DIAGNOSIS — Z8673 Personal history of transient ischemic attack (TIA), and cerebral infarction without residual deficits: Secondary | ICD-10-CM | POA: Insufficient documentation

## 2018-09-08 DIAGNOSIS — I2510 Atherosclerotic heart disease of native coronary artery without angina pectoris: Secondary | ICD-10-CM | POA: Insufficient documentation

## 2018-09-08 DIAGNOSIS — Z5181 Encounter for therapeutic drug level monitoring: Secondary | ICD-10-CM | POA: Insufficient documentation

## 2018-09-08 DIAGNOSIS — Z7982 Long term (current) use of aspirin: Secondary | ICD-10-CM | POA: Diagnosis not present

## 2018-09-08 DIAGNOSIS — Z981 Arthrodesis status: Secondary | ICD-10-CM | POA: Insufficient documentation

## 2018-09-08 DIAGNOSIS — F419 Anxiety disorder, unspecified: Secondary | ICD-10-CM | POA: Insufficient documentation

## 2018-09-08 DIAGNOSIS — Z760 Encounter for issue of repeat prescription: Secondary | ICD-10-CM | POA: Diagnosis not present

## 2018-09-08 DIAGNOSIS — Z79891 Long term (current) use of opiate analgesic: Secondary | ICD-10-CM | POA: Diagnosis not present

## 2018-09-08 DIAGNOSIS — I714 Abdominal aortic aneurysm, without rupture: Secondary | ICD-10-CM | POA: Diagnosis not present

## 2018-09-08 DIAGNOSIS — M48061 Spinal stenosis, lumbar region without neurogenic claudication: Secondary | ICD-10-CM | POA: Diagnosis not present

## 2018-09-08 DIAGNOSIS — R531 Weakness: Secondary | ICD-10-CM | POA: Diagnosis not present

## 2018-09-08 DIAGNOSIS — J439 Emphysema, unspecified: Secondary | ICD-10-CM | POA: Insufficient documentation

## 2018-09-08 DIAGNOSIS — M5136 Other intervertebral disc degeneration, lumbar region: Secondary | ICD-10-CM | POA: Insufficient documentation

## 2018-09-08 DIAGNOSIS — M5442 Lumbago with sciatica, left side: Secondary | ICD-10-CM | POA: Insufficient documentation

## 2018-09-08 DIAGNOSIS — I10 Essential (primary) hypertension: Secondary | ICD-10-CM | POA: Insufficient documentation

## 2018-09-08 DIAGNOSIS — Z79899 Other long term (current) drug therapy: Secondary | ICD-10-CM | POA: Insufficient documentation

## 2018-09-08 DIAGNOSIS — Z886 Allergy status to analgesic agent status: Secondary | ICD-10-CM | POA: Insufficient documentation

## 2018-09-08 DIAGNOSIS — M5441 Lumbago with sciatica, right side: Secondary | ICD-10-CM | POA: Diagnosis not present

## 2018-09-08 DIAGNOSIS — G894 Chronic pain syndrome: Secondary | ICD-10-CM

## 2018-09-08 MED ORDER — OXYCODONE-ACETAMINOPHEN 10-325 MG PO TABS: 1.0000 | ORAL_TABLET | Freq: Three times a day (TID) | ORAL | 0 refills | Status: DC | PRN

## 2018-09-08 NOTE — Progress Notes (Signed)
Nursing Pain Medication Assessment:  Safety precautions to be maintained throughout the outpatient stay will include: orient to surroundings, keep bed in low position, maintain call bell within reach at all times, provide assistance with transfer out of bed and ambulation.  Medication Inspection Compliance: Pill count conducted under aseptic conditions, in front of the patient. Neither the pills nor the bottle was removed from the patient's sight at any time. Once count was completed pills were immediately returned to the patient in their original bottle.  Medication: Oxycodone/APAP Pill/Patch Count: 5 of 90 pills remain Pill/Patch Appearance: Markings consistent with prescribed medication Bottle Appearance: Standard pharmacy container. Clearly labeled. Filled Date: 09/ 03/ 2019 Last Medication intake:  Today

## 2018-09-08 NOTE — Patient Instructions (Signed)
You have been escribed 2 scripts for oxycodone today.

## 2018-09-08 NOTE — Progress Notes (Signed)
Patient's Name: Logan Logan Vazquez  MRN: [id number]  Referring Provider: Virginia Crews, MD  DOB: 1946/09/28  PCP: No primary care provider on file.  DOS: 09/08/2018  Note by: Logan Santa, MD  Service setting: Ambulatory outpatient  Specialty: Interventional Pain Management  Location: [facility] (AMB) Pain Management Facility    Patient type: Established   Primary Reason(s) for Visit: Encounter for prescription drug management. (Level of risk: moderate)  CC: Hip Pain (right) and Leg Pain (right)  HPI  Logan Logan Vazquez is a 72 y.o. year old, male patient, who comes today for a medication management evaluation. He has Essential hypertension; Tobacco dependence; Abdominal aortic aneurysm (AAA) without rupture ([place]); Back pain; Depression, recurrent ([place]); COPD (chronic obstructive pulmonary disease) ([place]); Anxiety disorder; Atherosclerotic heart disease of native coronary artery without angina pectoris; History of CVA (cerebrovascular accident); Acquired right foot drop; History of lumbar spinal fusion; Lumbar degenerative disc disease; At high risk for falls; Chronic nausea; Chronic prescription opiate use; Descending thoracic aortic aneurysm ([place]); Enthesopathy of knee; GERD (gastroesophageal reflux disease); Ischemic stroke ([place]); Major depressive disorder, single episode; Pain medication agreement; Recurrent major depressive disorder, in partial remission ([place]); Stable angina ([place]); Tobacco use disorder; Chronic pain syndrome; Long term current use of opiate analgesic; and Chronic bilateral low back pain with bilateral sciatica on their problem list. His primarily concern today is the Hip Pain (right) and Leg Pain (right)  Pain Assessment: Location: Right Hip Radiating: rADIATES FROM RIGHT HIP TO RIGHT LEG TO FOOT Onset: More than a month ago Duration: Chronic pain Quality: Sharp Severity: 9 /10 (subjective, self-reported pain score)  Note: Reported level is inconsistent with clinical observations.  Clinically the patient looks like a 3/10 A 3/10 is viewed as "Moderate" and described as significantly interfering with activities of daily living (ADL). It becomes difficult to feed, bathe, get dressed, get on and off the toilet or to perform personal hygiene functions. Difficult to get in and out of bed or a chair without assistance. Very distracting. With effort, it can be ignored when deeply involved in activities. Information on the proper use of the pain scale provided to the patient today. When using our objective Pain Scale, levels between 6 and 10/10 are said to belong in an emergency room, as it progressively worsens from a 6/10, described as severely limiting, requiring emergency care not usually available at an outpatient pain management facility. At a 6/10 level, communication becomes difficult and requires great effort. Assistance to reach the emergency department may be required. Facial flushing and profuse sweating along with potentially dangerous increases in heart rate and blood pressure will be evident. Effect on ADL: limits activities Timing: Intermittent Modifying factors: DENIES BP:    HR:    Logan Logan Vazquez was last scheduled for an appointment on 05/14/2018 for medication management. During today's appointment we reviewed Logan Logan Vazquez chronic pain status, as well as his outpatient medication regimen.  The patient  reports that he does not use drugs. His body mass index is 22.74 kg/m.  Further details on both, my assessment(s), as well as the proposed treatment plan, please see below.  Controlled Substance Pharmacotherapy Assessment REMS (Risk Evaluation and Mitigation Strategy)  Analgesic: Percocet 10 mg 3 times daily as needed, quantity 90/month MME/day: 45 mg/day.  Logan Shorter, RN  09/08/2018  1:53 PM  Signed Nursing Pain Medication Assessment:  Safety precautions to be maintained throughout the outpatient stay will include: orient to surroundings, keep bed in low position,  maintain call bell within  reach at all times, provide assistance with transfer out of bed and ambulation.  Medication Inspection Compliance: Pill count conducted under aseptic conditions, in front of the patient. Neither the pills nor the bottle was removed from the patient's sight at any time. Once count was completed pills were immediately returned to the patient in their original bottle.  Medication: Oxycodone/APAP Pill/Patch Count: 5 of 90 pills remain Pill/Patch Appearance: Markings consistent with prescribed medication Bottle Appearance: Standard pharmacy container. Clearly labeled. Filled Date: 09/ 03/ 2019 Last Medication intake:  Today Pharmacokinetics: Liberation and absorption (onset of action): WNL Distribution (time to peak effect): WNL Metabolism and excretion (duration of action): WNL         Pharmacodynamics: Desired effects: Analgesia: Logan Logan Vazquez reports >50% benefit. Functional ability: Patient reports that medication allows him to accomplish basic ADLs Clinically meaningful improvement in function (CMIF): Sustained CMIF goals met Perceived effectiveness: Described as relatively effective, allowing for increase in activities of daily living (ADL) Undesirable effects: Side-effects or Adverse reactions: None reported Monitoring: Logan Vazquez PMP: Online review of the past 94-monthperiod conducted. Compliant with practice rules and regulations Last UDS on record: Summary  Date Value Ref Range Status  03/10/2018 FINAL  Final    Comment:    ==================================================================== [facility]RE COMP DRUG ANALYSIS,UR ==================================================================== Test                             Result       Flag       Units Drug Present and Declared for Prescription Verification   Pregabalin                     PRESENT      EXPECTED   Duloxetine                     PRESENT      EXPECTED Drug Present not Declared for Prescription  Verification   Mirtazapine                    PRESENT      UNEXPECTED Drug Absent but Declared for Prescription Verification   Salicylate                     Not Detected UNEXPECTED    Aspirin, as indicated in the declared medication list, is not    always detected even when used as directed. ==================================================================== Test                      Result    Flag   Units      Ref Range   Creatinine              159              mg/dL      >=20 ==================================================================== Declared Medications:  The flagging and interpretation on this report are based on the  following declared medications.  Unexpected results may arise from  inaccuracies in the declared medications.  **Note: The testing scope of this panel includes these medications:  Duloxetine  Pregabalin  **Note: The testing scope of this panel does not include small to  moderate amounts of these reported medications:  Aspirin (Aspirin 81)  **Note: The testing scope of this panel does not include following  reported medications:  Albuterol  Atorvastatin  Cholecalciferol  Furosemide  Lisinopril  Nicotine  Nitroglycerin (NTG)  Omeprazole  Vitamin B12 ==================================================================== For clinical consultation, please call [phone]. ====================================================================    UDS interpretation: Compliant          Medication Assessment Form: Reviewed. Patient indicates being compliant with therapy Treatment compliance: Compliant Risk Assessment Profile: Aberrant behavior: See prior evaluations. None observed or detected today Comorbid factors increasing risk of overdose: See prior notes. No additional risks detected today Opioid risk tool (ORT) (Total Score): 1 Personal History of Substance Abuse (SUD-Substance use disorder):  Alcohol: Negative  Illegal Drugs: Negative  Rx  Drugs: Negative  ORT Risk Level calculation: Low Risk Risk of substance use disorder (SUD): Low Opioid Risk Tool - 09/08/18 1351      Family History of Substance Abuse   Alcohol  Negative    Illegal Drugs  Negative    Rx Drugs  Negative      Personal History of Substance Abuse   Alcohol  Negative    Illegal Drugs  Negative    Rx Drugs  Negative      Age   Age between 43-45 years   No      History of Preadolescent Sexual Abuse   History of Preadolescent Sexual Abuse  Negative or Male      Psychological Disease   Psychological Disease  Negative    Depression  Positive      Total Score   Opioid Risk Tool Scoring  1    Opioid Risk Interpretation  Low Risk      ORT Scoring interpretation table:  Score <3 = Low Risk for SUD  Score between 4-7 = Moderate Risk for SUD  Score >8 = High Risk for Opioid Abuse   Risk Mitigation Strategies:  Patient Counseling: Covered Patient-Prescriber Agreement (PPA): Present and active  Notification to other healthcare providers: Done  Pharmacologic Plan: No change in therapy, at this time.             Laboratory Chemistry  Inflammation Markers (CRP: Acute Phase) (ESR: Chronic Phase) No results found for: CRP, ESRSEDRATE, LATICACIDVEN                       Rheumatology Markers No results found for: RF, ANA, LABURIC, URICUR, LYMEIGGIGMAB, LYMEABIGMQN, HLAB27                      Renal Function Markers Lab Results  Component Value Date   BUN 15 10/23/2017   CREATININE 0.92 13/07/6577   BCR NOT APPLICABLE 46/96/2952                             Hepatic Function Markers Lab Results  Component Value Date   AST 14 10/23/2017   ALT 10 10/23/2017                        Electrolytes Lab Results  Component Value Date   NA 138 10/23/2017   K 4.9 10/23/2017   CL 102 10/23/2017   CALCIUM 9.5 10/23/2017                        Neuropathy Markers No results found for: VITAMINB12, FOLATE, HGBA1C, HIV                      CNS  Tests No results found for: COLORCSF, APPEARCSF, RBCCOUNTCSF, WBCCSF, POLYSCSF, LYMPHSCSF, EOSCSF, PROTEINCSF, GLUCCSF, JCVIRUS, CSFOLI, IGGCSF  Bone Pathology Markers No results found for: VD25OH, H139778, SJ6283MO2, HU7654YT0, 25OHVITD1, 25OHVITD2, 25OHVITD3, TESTOFREE, TESTOSTERONE                       Coagulation Parameters Lab Results  Component Value Date   PLT 349 10/23/2017                        Cardiovascular Markers Lab Results  Component Value Date   HGB 14.7 10/23/2017   HCT 43.1 10/23/2017                         CA Markers No results found for: CEA, CA125, LABCA2                      Note: Lab results reviewed.  Recent Diagnostic Imaging Results  MR LUMBAR SPINE WO CONTRAST CLINICAL DATA:  Previous fusion in 1985. Now with pain in the hips and legs. Numbness in the legs.  EXAM: MRI LUMBAR SPINE WITHOUT CONTRAST  TECHNIQUE: Multiplanar, multisequence MR imaging of the lumbar spine was performed. No intravenous contrast was administered.  COMPARISON:  None.  FINDINGS: Segmentation: I do not have plain films for correlation. L5-S1 interspace appears hypoplastic. There may be transitional anatomy.  Alignment: Straightening of the normal lumbar lordosis due to prior fusion L3-S1.  Vertebrae: Chronic T12 compression fracture. The patient has undergone some type of posterior fixation from L3 through S1. There is extensive metallic artifact. I do not have plain film correlates. No pedicle screws are placed. There appears to be solid interbody arthrodesis.  Conus medullaris and cauda equina: Conus extends to the L1 level. Conus and cauda equina appear normal.  Paraspinal and other soft tissues: Renal cystic disease, incompletely evaluated. Abdominal aortic aneurysm, 43 by 43 mm cross-section, with some mural thrombus.  Disc levels:  L1-L2: Disc desiccation and loss of disc height. Central protrusion. Posterior element  hypertrophy. Moderate to severe stenosis. BILATERAL subarticular zone narrowing results in L2 nerve root impingement. BILATERAL foraminal narrowing, not clearly compressive.  L2-L3: Disc desiccation, slight loss of disc height. Central disc extrusion. Posterior element hypertrophy. Severe stenosis, probable near complete block with crowding of the cauda equina nerve roots centrally. Severe BILATERAL L3 nerve root impingement. BILATERAL foraminal narrowing likely affects both L2 nerve roots.  L3-L4:  Post fusion interspace, grossly unremarkable.  L4-L5:  Post fusion interspace, grossly unremarkable.  L5-S1:  Post fusion interspace, grossly unremarkable.  IMPRESSION: Solid appearing L3-S1 arthrodesis. Some type of metallic posterior fixation has been performed.  Severe stenosis at L2-3 related to posterior element hypertrophy and central disc extrusion. BILATERAL L3 and L2 nerve root impingement are likely.  Moderate to severe stenosis L1-2, central protrusion with posterior element hypertrophy, with BILATERAL L2 nerve root impingement.  4.3 cm abdominal aortic aneurysm. Some mural thrombus is present. Recommend followup by [place] in 1 year. This recommendation follows ACR consensus guidelines: White Paper of the ACR Incidental Findings Committee II on Vascular Findings. J Am Coll Radiol 2013; 10:789-794.  Electronically Signed   By: Staci Righter M.D.   On: 03/05/2018 20:16  Complexity Note: Imaging results reviewed. Results shared with Logan Logan Vazquez, using Layman's terms.                         Meds   Current Outpatient Medications:  .  albuterol (PROVENTIL HFA;VENTOLIN HFA) 108 (90 Base) MCG/ACT  inhaler, Inhale into the lungs., Disp: , Rfl:  .  aspirin EC 81 MG tablet, Take 81 mg by mouth., Disp: , Rfl:  .  atorvastatin (LIPITOR) 40 MG tablet, Take 1 tablet (40 mg total) by mouth daily., Disp: 30 tablet, Rfl: 3 .  baclofen (LIORESAL) 10 MG tablet, TAKE 1 TABLET TWICE DAILY AS  NEEDED FOR HEADACHE OR MUSCLE SPASM, Disp: , Rfl:  .  Cholecalciferol (VITAMIN D-1000 MAX ST) 1000 units tablet, Take by mouth., Disp: , Rfl:  .  Cyanocobalamin (B-12) 2000 MCG TABS, Take by mouth., Disp: , Rfl:  .  docusate sodium (COLACE) 100 MG capsule, Take 100 mg by mouth 2 (two) times daily., Disp: , Rfl:  .  FLUoxetine (PROZAC) 20 MG tablet, Take 1 tablet (20 mg total) by mouth daily., Disp: 30 tablet, Rfl: 0 .  furosemide (LASIX) 20 MG tablet, Take 20 mg by mouth daily. , Disp: , Rfl:  .  hydrocortisone (ANUSOL-HC) 2.5 % rectal cream, Place 1 application rectally 2 (two) times daily., Disp: 30 g, Rfl: 0 .  lisinopril (PRINIVIL,ZESTRIL) 40 MG tablet, Take 1 tablet (40 mg total) by mouth daily., Disp: 30 tablet, Rfl: 0 .  mupirocin cream (BACTROBAN) 2 %, Apply 1 application topically 2 (two) times daily., Disp: 15 g, Rfl: 0 .  nitroGLYCERIN (NITROSTAT) 0.4 MG SL tablet, Place under the tongue., Disp: , Rfl:  .  omeprazole (PRILOSEC) 20 MG capsule, Take 1 capsule (20 mg total) by mouth daily., Disp: 90 capsule, Rfl: 3 .  [START ON 09/16/2018] oxyCODONE-acetaminophen (PERCOCET) 10-325 MG tablet, Take 1 tablet by mouth every 8 (eight) hours as needed for pain. For chronic pain To last for 30 days from fill date, Disp: 90 tablet, Rfl: 0 .  pregabalin (LYRICA) 75 MG capsule, Take 2 capsules (150 mg total) by mouth at bedtime as needed., Disp: 60 capsule, Rfl: 5 .  celecoxib (CELEBREX) 100 MG capsule, Take 1 capsule (100 mg total) by mouth 2 (two) times daily. (Patient not taking: Reported on 09/08/2018), Disp: 180 capsule, Rfl: 3 .  [START ON 10/16/2018] oxyCODONE-acetaminophen (PERCOCET) 10-325 MG tablet, Take 1 tablet by mouth every 8 (eight) hours as needed for pain., Disp: 90 tablet, Rfl: 0  ROS  Constitutional: Denies any fever or chills Gastrointestinal: No reported hemesis, hematochezia, vomiting, or acute GI distress Musculoskeletal: Denies any acute onset joint swelling, redness, loss of  ROM, or weakness Neurological: No reported episodes of acute onset apraxia, aphasia, dysarthria, agnosia, amnesia, paralysis, loss of coordination, or loss of consciousness  Allergies  Logan Logan Vazquez is allergic to ibuprofen.  [place]  Drug: Logan Logan Vazquez  reports that he does not use drugs. Alcohol:  reports that he does not drink alcohol. Tobacco:  reports that he has been smoking cigarettes. He has a 14.00 pack-year smoking history. He has never used smokeless tobacco. Medical:  has a past medical history of Arthritis, Asthma, CAD (coronary artery disease), Depression, Emphysema lung ([place]), Glaucoma, and Hypertension. Surgical: Logan Logan Vazquez  has a past surgical history that includes heart surgery (07/1999); Hip surgery (Left, 1968); Back surgery (1985); Skin graft (1968); and Humerus fracture surgery (1968). Family: family history includes Cancer in his father; Hearing loss in his mother; Heart attack in his brother; Heart disease in his brother; Hypertension in his father and mother; Stroke in his sister.  Constitutional Exam  General appearance: Well nourished, well developed, and well hydrated. In no apparent acute distress Vitals:   09/08/18 1345  Weight: 154 lb (69.9 kg)  Height: '5\' 9"'$  (1.753 m)   BMI Assessment: Estimated body mass index is 22.74 kg/m as calculated from the following:   Height as of this encounter: '5\' 9"'$  (1.753 m).   Weight as of this encounter: 154 lb (69.9 kg).  BMI interpretation table: BMI level Category Range association with higher incidence of chronic pain  <18 kg/m2 Underweight   18.5-24.9 kg/m2 Ideal body weight   25-29.9 kg/m2 Overweight Increased incidence by 20%  30-34.9 kg/m2 Obese (Class I) Increased incidence by 68%  35-39.9 kg/m2 Severe obesity (Class II) Increased incidence by 136%  >40 kg/m2 Extreme obesity (Class III) Increased incidence by 254%   Patient's current BMI Ideal Body weight  Body mass index is 22.74 kg/m. Ideal body weight: 70.7 kg  (155 lb 13.8 oz)   BMI Readings from Last 4 Encounters:  09/08/18 22.74 kg/m  07/08/18 22.71 kg/m  05/14/18 21.41 kg/m  04/16/18 21.41 kg/m   Wt Readings from Last 4 Encounters:  09/08/18 154 lb (69.9 kg)  07/08/18 145 lb (65.8 kg)  05/14/18 145 lb (65.8 kg)  04/16/18 145 lb (65.8 kg)  Psych/Mental status: Alert, oriented x 3 (person, place, & time)       Eyes: PERLA Respiratory: No evidence of acute respiratory distress  Cervical Spine Area Exam  Skin & Axial Inspection: No masses, redness, edema, swelling, or associated skin lesions Alignment: Symmetrical Functional ROM: Unrestricted ROM      Stability: No instability detected Muscle Tone/Strength: Functionally intact. No obvious neuro-muscular anomalies detected. Sensory (Neurological): Unimpaired Palpation: No palpable anomalies              Upper Extremity (UE) Exam    Side: Right upper extremity  Side: Left upper extremity  Skin & Extremity Inspection: Skin color, temperature, and hair growth are WNL. No peripheral edema or cyanosis. No masses, redness, swelling, asymmetry, or associated skin lesions. No contractures.  Skin & Extremity Inspection: Skin color, temperature, and hair growth are WNL. No peripheral edema or cyanosis. No masses, redness, swelling, asymmetry, or associated skin lesions. No contractures.  Functional ROM: Unrestricted ROM          Functional ROM: Unrestricted ROM          Muscle Tone/Strength: Functionally intact. No obvious neuro-muscular anomalies detected.  Muscle Tone/Strength: Functionally intact. No obvious neuro-muscular anomalies detected.  Sensory (Neurological): Unimpaired          Sensory (Neurological): Unimpaired          Palpation: No palpable anomalies              Palpation: No palpable anomalies              Provocative Test(s):  Phalen's test: deferred Tinel's test: deferred Apley's scratch test (touch opposite shoulder):  Action 1 (Across chest): deferred Action 2 (Overhead):  deferred Action 3 (LB reach): deferred   Provocative Test(s):  Phalen's test: deferred Tinel's test: deferred Apley's scratch test (touch opposite shoulder):  Action 1 (Across chest): deferred Action 2 (Overhead): deferred Action 3 (LB reach): deferred    Thoracic Spine Area Exam  Skin & Axial Inspection: No masses, redness, or swelling Alignment: Symmetrical Functional ROM: Unrestricted ROM Stability: No instability detected Muscle Tone/Strength: Functionally intact. No obvious neuro-muscular anomalies detected. Sensory (Neurological): Unimpaired Muscle strength & Tone: No palpable anomalies  Lumbar Spine Area Exam  Skin & Axial Inspection: Well healed scar from previous spine surgery detected Alignment: Symmetrical Functional ROM: Decreased ROM       Stability: No  instability detected Muscle Tone/Strength: Functionally intact. No obvious neuro-muscular anomalies detected. Sensory (Neurological): Dermatomal pain pattern Palpation: Complains of area being tender to palpation Bilateral Fist Percussion Test Provocative Tests: Lumbar Hyperextension/rotation test: Positive bilaterally for facet joint pain. Lumbar quadrant test (Kemp's test): (+) bilateral for foraminal stenosis Lumbar Lateral bending test: (+) due to pain. Patrick's Maneuver: deferred today                   FABER test: deferred today       Thigh-thrust test: deferred today       S-I compression test: deferred today       S-I distraction test: deferred today        Gait & Posture Assessment  Ambulation: Patient ambulates using a walker Gait: Relatively normal for age and body habitus Posture: Difficulty standing up straight, due to pain    Lower Extremity Exam    Side: Right lower extremity  Side: Left lower extremity  Stability: No instability observed          Stability: No instability observed          Skin & Extremity Inspection: Skin color, temperature, and hair growth are WNL. No peripheral edema or  cyanosis. No masses, redness, swelling, asymmetry, or associated skin lesions. No contractures.  Skin & Extremity Inspection: Skin color, temperature, and hair growth are WNL. No peripheral edema or cyanosis. No masses, redness, swelling, asymmetry, or associated skin lesions. No contractures.  Functional ROM: Unrestricted ROM                  Functional ROM: Unrestricted ROM                  Muscle Tone/Strength: Functionally intact. No obvious neuro-muscular anomalies detected.  Muscle Tone/Strength: Functionally intact. No obvious neuro-muscular anomalies detected.  Sensory (Neurological): Unimpaired  Sensory (Neurological): Unimpaired  Palpation: No palpable anomalies  Palpation: No palpable anomalies   Assessment  Primary Diagnosis & Pertinent Problem List: The primary encounter diagnosis was Chronic bilateral low back pain with bilateral sciatica. Diagnoses of Lumbar degenerative disc disease, Chronic pain syndrome, Long term current use of opiate analgesic, History of CVA (cerebrovascular accident), Right sided weakness, Acquired right foot drop, and History of lumbar spinal fusion were also pertinent to this visit.  Status Diagnosis  Controlled Controlled Controlled 1. Chronic bilateral low back pain with bilateral sciatica   2. Lumbar degenerative disc disease   3. Chronic pain syndrome   4. Long term current use of opiate analgesic   5. History of CVA (cerebrovascular accident)   6. Right sided weakness   7. Acquired right foot drop   8. History of lumbar spinal fusion      General Recommendations: The pain condition that the patient suffers from is best treated with a multidisciplinary approach that involves an increase in physical activity to prevent de-conditioning and worsening of the pain cycle, as well as psychological counseling (formal and/or informal) to address the co-morbid psychological affects of pain. Treatment will often involve judicious use of pain medications and  interventional procedures to decrease the pain, allowing the patient to participate in the physical activity that will ultimately produce long-lasting pain reductions. The goal of the multidisciplinary approach is to return the patient to a higher level of overall function and to restore their ability to perform activities of daily living.  72 year old male with a history of lumbar spine surgery greater than 20 years ago(L4/L5 laminectomy, L4 through S1 posterior fusion,  with unremarkable hardware and incorporated bone grafting),CVA in 2018 that resulted in right-sided weakness who presents with axial low back pain that radiates into his right posterior thigh and also occasional right ankle pain. Pain has been chronic in nature. Patient was previously on very high-dose opioid therapy which included OxyContin 80 mg 4 times daily, oxycodone 10 mg up to 6 times a day (PRIOR TO PT SEEING ME). 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  Patient returns today for medication refill.  We will refill his Percocet below at its current dose for 2 months.  Patient to continue Lyrica as prescribed. Patient endorsed understanding.  Plan: -Refill Percocet 10 mg 3 times daily as needed, quantity 90 for 2 months -Continue Lyrica -Consider L2/L3 ESI if worsening pain   Plan of Care  Pharmacotherapy (Medications Ordered): Meds ordered this encounter  Medications  . oxyCODONE-acetaminophen (PERCOCET) 10-325 MG tablet    Sig: Take 1 tablet by mouth every 8 (eight) hours as needed for pain. For chronic pain To  last for 30 days from fill date    Dispense:  90 tablet    Refill:  0    Do not place this medication, or any other prescription from our practice, on "Automatic Refill". Patient may have prescription filled one day early if pharmacy is closed on scheduled refill date.  [name] oxyCODONE-acetaminophen (PERCOCET) 10-325 MG tablet    Sig: Take 1 tablet by mouth every 8 (eight) hours as needed for pain.    Dispense:  90 tablet    Refill:  0    Do not place this medication, or any other prescription from our practice, on "Automatic Refill". Patient may have prescription filled one day early if pharmacy is closed on scheduled refill date.   Time Note: Greater than 50% of the 25 minute(s) of face-to-face time spent with Logan Logan Vazquez, was spent in counseling/coordination of care regarding: Logan Logan Vazquez primary cause of pain, the treatment plan, treatment alternatives, the risks and possible complications of proposed treatment, medication side effects, the opioid analgesic risks and possible complications, the appropriate use of his medications, realistic expectations, the goals of pain management (increased in functionality), the medication agreement and the patient's responsibilities when it comes to controlled substances.  Provider-requested follow-up: Return in about 8 weeks (around 11/03/2018) for MM with Crystal.  Future Appointments  Date Time Provider [place]  11/03/2018  1:45 PM Vevelyn Francois, NP [facility] None  04/09/2019 10:30 AM [facility] None  04/09/2019 11:30 AM Dew, Erskine Squibb, MD [facility] None    Primary Care Physician: No primary care provider on file. Location: [facility] Note by: Logan Logan Vazquez, M.D Date: 09/08/2018; Time: 4:10 PM  Patient Instructions  You have been escribed 2 scripts for oxycodone today.

## 2018-09-29 ENCOUNTER — Encounter: Payer: Medicare HMO | Admitting: Student in an Organized Health Care Education/Training Program

## 2018-10-16 ENCOUNTER — Other Ambulatory Visit: Payer: Self-pay | Admitting: Family Medicine

## 2018-10-20 ENCOUNTER — Other Ambulatory Visit: Payer: Self-pay | Admitting: Family Medicine

## 2018-11-03 ENCOUNTER — Ambulatory Visit: Payer: Medicare HMO | Admitting: Nurse Practitioner

## 2018-11-05 ENCOUNTER — Encounter: Payer: Medicare HMO | Admitting: Nurse Practitioner

## 2018-11-09 ENCOUNTER — Ambulatory Visit: Payer: Medicare HMO | Attending: Nurse Practitioner | Admitting: Nurse Practitioner

## 2018-11-09 ENCOUNTER — Other Ambulatory Visit: Payer: Self-pay

## 2018-11-09 ENCOUNTER — Encounter: Payer: Self-pay | Admitting: Nurse Practitioner

## 2018-11-09 VITALS — BP 147/94 | HR 78 | Temp 97.8°F | Ht 69.0 in | Wt 154.0 lb

## 2018-11-09 DIAGNOSIS — Z9181 History of falling: Secondary | ICD-10-CM | POA: Diagnosis not present

## 2018-11-09 DIAGNOSIS — Z5181 Encounter for therapeutic drug level monitoring: Secondary | ICD-10-CM | POA: Insufficient documentation

## 2018-11-09 DIAGNOSIS — Z79891 Long term (current) use of opiate analgesic: Secondary | ICD-10-CM | POA: Diagnosis not present

## 2018-11-09 DIAGNOSIS — Z8673 Personal history of transient ischemic attack (TIA), and cerebral infarction without residual deficits: Secondary | ICD-10-CM | POA: Insufficient documentation

## 2018-11-09 DIAGNOSIS — M5136 Other intervertebral disc degeneration, lumbar region: Secondary | ICD-10-CM

## 2018-11-09 DIAGNOSIS — I714 Abdominal aortic aneurysm, without rupture: Secondary | ICD-10-CM | POA: Diagnosis not present

## 2018-11-09 DIAGNOSIS — G894 Chronic pain syndrome: Secondary | ICD-10-CM

## 2018-11-09 DIAGNOSIS — F419 Anxiety disorder, unspecified: Secondary | ICD-10-CM | POA: Diagnosis not present

## 2018-11-09 DIAGNOSIS — I10 Essential (primary) hypertension: Secondary | ICD-10-CM | POA: Diagnosis not present

## 2018-11-09 DIAGNOSIS — I2510 Atherosclerotic heart disease of native coronary artery without angina pectoris: Secondary | ICD-10-CM | POA: Diagnosis not present

## 2018-11-09 DIAGNOSIS — F17210 Nicotine dependence, cigarettes, uncomplicated: Secondary | ICD-10-CM | POA: Insufficient documentation

## 2018-11-09 DIAGNOSIS — F329 Major depressive disorder, single episode, unspecified: Secondary | ICD-10-CM | POA: Insufficient documentation

## 2018-11-09 DIAGNOSIS — G8929 Other chronic pain: Secondary | ICD-10-CM

## 2018-11-09 DIAGNOSIS — J449 Chronic obstructive pulmonary disease, unspecified: Secondary | ICD-10-CM | POA: Diagnosis not present

## 2018-11-09 DIAGNOSIS — M5441 Lumbago with sciatica, right side: Secondary | ICD-10-CM | POA: Diagnosis not present

## 2018-11-09 DIAGNOSIS — M21371 Foot drop, right foot: Secondary | ICD-10-CM | POA: Diagnosis not present

## 2018-11-09 DIAGNOSIS — K219 Gastro-esophageal reflux disease without esophagitis: Secondary | ICD-10-CM | POA: Insufficient documentation

## 2018-11-09 DIAGNOSIS — M5442 Lumbago with sciatica, left side: Secondary | ICD-10-CM | POA: Diagnosis not present

## 2018-11-09 MED ORDER — OXYCODONE-ACETAMINOPHEN 10-325 MG PO TABS: 1.0000 | ORAL_TABLET | Freq: Three times a day (TID) | ORAL | 0 refills | Status: DC | PRN

## 2018-11-09 MED ORDER — PREGABALIN 75 MG PO CAPS: 150.0000 mg | ORAL_CAPSULE | Freq: Every evening | ORAL | 1 refills | Status: DC | PRN

## 2018-11-09 NOTE — Progress Notes (Signed)
Nursing Pain Medication Assessment:  Safety precautions to be maintained throughout the outpatient stay will include: orient to surroundings, keep bed in low position, maintain call bell within reach at all times, provide assistance with transfer out of bed and ambulation.  Medication Inspection Compliance: Logan Vazquez did not comply with our request to bring his pills to be counted. He was reminded that bringing the medication bottles, even when empty, is a requirement.  Medication: None brought in. Pill/Patch Count: None available to be counted. Bottle Appearance: No container available. Did not bring bottle(s) to appointment. Filled Date: N/A Last Medication intake:  11/04/18

## 2018-11-09 NOTE — Progress Notes (Signed)
0Patient's Name: Logan Vazquez  MRN: [id number]  Referring Provider: No ref. provider found  DOB: [birth year]  PCP: Patient, No Pcp Per  DOS: 11/09/2018  Note by: Vevelyn Francois NP  Service setting: Ambulatory outpatient  Specialty: Interventional Pain Management  Location: [facility] (AMB) Pain Management Facility    Patient type: Established    Primary Reason(s) for Visit: Encounter for prescription drug management. (Level of risk: moderate)  CC: Back Pain  HPI  [name] is a 72 y.o. year old, male patient, who comes today for a medication management evaluation. He has Essential hypertension; Tobacco dependence; Abdominal aortic aneurysm (AAA) without rupture ([place]); Back pain; Depression, recurrent ([place]); COPD (chronic obstructive pulmonary disease) ([place]); Anxiety disorder; Atherosclerotic heart disease of native coronary artery without angina pectoris; History of CVA (cerebrovascular accident); Acquired right foot drop; History of lumbar spinal fusion; Lumbar degenerative disc disease; At high risk for falls; Chronic nausea; Chronic prescription opiate use; Descending thoracic aortic aneurysm ([place]); Enthesopathy of knee; GERD (gastroesophageal reflux disease); Ischemic stroke ([place]); Major depressive disorder, single episode; Pain medication agreement; Recurrent major depressive disorder, in partial remission ([place]); Stable angina ([place]); Tobacco use disorder; Chronic pain syndrome; Long term current use of opiate analgesic; and Chronic bilateral low back pain with bilateral sciatica on their problem list. His primarily concern today is the Back Pain  Pain Assessment: Location: Lower Back Radiating: pain radiaties down both leg Onset: More than a month ago Duration: Chronic pain Quality: Aching Severity: 8 /10 (subjective, self-reported pain score)  Note: Reported level is compatible with observation. Clinically the patient looks like a 2/10 A 2/10 is viewed as "Mild to Moderate" and described as  noticeable and distracting. Impossible to hide from other people. More frequent flare-ups. Still possible to adapt and function close to normal. It can be very annoying and may have occasional stronger flare-ups. With discipline, patients may get used to it and adapt.       When using our objective Pain Scale, levels between 6 and 10/10 are said to belong in an emergency room, as it progressively worsens from a 6/10, described as severely limiting, requiring emergency care not usually available at an outpatient pain management facility. At a 6/10 level, communication becomes difficult and requires great effort. Assistance to reach the emergency department may be required. Facial flushing and profuse sweating along with potentially dangerous increases in heart rate and blood pressure will be evident. Effect on ADL: limits my daily activites Timing: Constant Modifying factors: medications BP: (!) 147/94  HR: 78  [name] was last scheduled for an appointment on 07/08/2018 for medication management. During today's appointment we reviewed [name] chronic pain status, as well as his outpatient medication regimen. He admits that he uses additional medication when he has additional pain at night. He is not in teresed in any injectrionas a t this time.   The patient  reports that he does not use drugs. His body mass index is 22.74 kg/m.  Further details on both, my assessment(s), as well as the proposed treatment plan, please see below.  Controlled Substance Pharmacotherapy Assessment REMS (Risk Evaluation and Mitigation Strategy)  Analgesic:Percocet 10 mg 3 times daily as needed, quantity 90/month MME/day:39m/day.  BChauncey Fischer RN  11/09/2018  1:26 PM  Sign at close encounter Nursing Pain Medication Assessment:  Safety precautions to be maintained throughout the outpatient stay will include: orient to surroundings, keep bed in low position, maintain call bell within reach at all times,  provide  assistance with transfer out of bed and ambulation.  Medication Inspection Compliance: [name] did not comply with our request to bring his pills to be counted. He was reminded that bringing the medication bottles, even when empty, is a requirement.  Medication: None brought in. Pill/Patch Count: None available to be counted. Bottle Appearance: No container available. Did not bring bottle(s) to appointment. Filled Date: N/A Last Medication intake:  11/04/18   Pharmacokinetics: Liberation and absorption (onset of action): WNL Distribution (time to peak effect): WNL Metabolism and excretion (duration of action): WNL         Pharmacodynamics: Desired effects: Analgesia: [name] reports >50% benefit. Functional ability: Patient reports that medication allows him to accomplish basic ADLs Clinically meaningful improvement in function (CMIF): Sustained CMIF goals met Perceived effectiveness: Described as relatively effective, allowing for increase in activities of daily living (ADL) Undesirable effects: Side-effects or Adverse reactions: None reported Monitoring: [place] PMP: Online review of the past 5-monthperiod conducted. Compliant with practice rules and regulations Last UDS on record: Summary  Date Value Ref Range Status  03/10/2018 FINAL  Final    Comment:    ==================================================================== TOXAS[facility]RE COMP DRUG ANALYSIS,UR ==================================================================== Test                             Result       Flag       Units Drug Present and Declared for Prescription Verification   Pregabalin                     PRESENT      EXPECTED   Duloxetine                     PRESENT      EXPECTED Drug Present not Declared for Prescription Verification   Mirtazapine                    PRESENT      UNEXPECTED Drug Absent but Declared for Prescription Verification   Salicylate                     Not Detected  UNEXPECTED    Aspirin, as indicated in the declared medication list, is not    always detected even when used as directed. ==================================================================== Test                      Result    Flag   Units      Ref Range   Creatinine              159              mg/dL      >=20 ==================================================================== Declared Medications:  The flagging and interpretation on this report are based on the  following declared medications.  Unexpected results may arise from  inaccuracies in the declared medications.  **Note: The testing scope of this panel includes these medications:  Duloxetine  Pregabalin  **Note: The testing scope of this panel does not include small to  moderate amounts of these reported medications:  Aspirin (Aspirin 81)  **Note: The testing scope of this panel does not include following  reported medications:  Albuterol  Atorvastatin  Cholecalciferol  Furosemide  Lisinopril  Nicotine  Nitroglycerin (NTG)  Omeprazole  Vitamin B12 ==================================================================== For clinical consultation, please call ([phone] ====================================================================    UDS  interpretation: Compliant          Medication Assessment Form: Reviewed. Patient indicates being compliant with therapy Treatment compliance: Compliant Risk Assessment Profile: Aberrant behavior: taking more medication than prescribed Comorbid factors increasing risk of overdose: caucasian and male gender Opioid risk tool (ORT) (Total Score): 0 Personal History of Substance Abuse (SUD-Substance use disorder):  Alcohol: Negative  Illegal Drugs: Negative  Rx Drugs: Negative  ORT Risk Level calculation: Low Risk Risk of substance use disorder (SUD): Moderate Opioid Risk Tool - 11/09/18 1332      Family History of Substance Abuse   Alcohol  Negative    Illegal Drugs   Negative    Rx Drugs  Negative      Personal History of Substance Abuse   Alcohol  Negative    Illegal Drugs  Negative    Rx Drugs  Negative      Age   Age between 24-45 years   No      History of Preadolescent Sexual Abuse   History of Preadolescent Sexual Abuse  Negative or Male      Psychological Disease   Psychological Disease  Negative    Depression  Negative      Total Score   Opioid Risk Tool Scoring  0    Opioid Risk Interpretation  Low Risk      ORT Scoring interpretation table:  Score <3 = Low Risk for SUD  Score between 4-7 = Moderate Risk for SUD  Score >8 = High Risk for Opioid Abuse   Risk Mitigation Strategies:  Patient Counseling: Covered Patient-Prescriber Agreement (PPA): Present and active  Notification to other healthcare providers: Done  Pharmacologic Plan: No change in therapy, at this time.             Laboratory Chemistry  Inflammation Markers (CRP: Acute Phase) (ESR: Chronic Phase) No results found for: CRP, ESRSEDRATE, LATICACIDVEN                       Rheumatology Markers No results found for: RF, ANA, LABURIC, URICUR, LYMEIGGIGMAB, LYMEABIGMQN, HLAB27                      Renal Function Markers Lab Results  Component Value Date   BUN 15 10/23/2017   CREATININE 0.92 40/98/1191   BCR NOT APPLICABLE 47/82/9562                             Hepatic Function Markers Lab Results  Component Value Date   AST 14 10/23/2017   ALT 10 10/23/2017                        Electrolytes Lab Results  Component Value Date   NA 138 10/23/2017   K 4.9 10/23/2017   CL 102 10/23/2017   CALCIUM 9.5 10/23/2017                        Neuropathy Markers No results found for: VITAMINB12, FOLATE, HGBA1C, HIV                      CNS Tests No results found for: COLORCSF, APPEARCSF, RBCCOUNTCSF, WBCCSF, POLYSCSF, LYMPHSCSF, EOSCSF, PROTEINCSF, GLUCCSF, JCVIRUS, CSFOLI, IGGCSF                      Bone Pathology Markers No  results found for: Patrick,  [id number], ER1540GQ6, PY1950DT2, 25OHVITD1, 25OHVITD2, 25OHVITD3, TESTOFREE, TESTOSTERONE                       Coagulation Parameters Lab Results  Component Value Date   PLT 349 10/23/2017                        Cardiovascular Markers Lab Results  Component Value Date   HGB 14.7 10/23/2017   HCT 43.1 10/23/2017                         CA Markers No results found for: CEA, CA125, LABCA2                      Note: Lab results reviewed.  Recent Diagnostic Imaging Results  MR LUMBAR SPINE WO CONTRAST CLINICAL DATA:  Previous fusion in 1985. Now with pain in the hips and legs. Numbness in the legs.  EXAM: MRI LUMBAR SPINE WITHOUT CONTRAST  TECHNIQUE: Multiplanar, multisequence MR imaging of the lumbar spine was performed. No intravenous contrast was administered.  COMPARISON:  None.  FINDINGS: Segmentation: I do not have plain films for correlation. L5-S1 interspace appears hypoplastic. There may be transitional anatomy.  Alignment: Straightening of the normal lumbar lordosis due to prior fusion L3-S1.  Vertebrae: Chronic T12 compression fracture. The patient has undergone some type of posterior fixation from L3 through S1. There is extensive metallic artifact. I do not have plain film correlates. No pedicle screws are placed. There appears to be solid interbody arthrodesis.  Conus medullaris and cauda equina: Conus extends to the L1 level. Conus and cauda equina appear normal.  Paraspinal and other soft tissues: Renal cystic disease, incompletely evaluated. Abdominal aortic aneurysm, 43 by 43 mm cross-section, with some mural thrombus.  Disc levels:  L1-L2: Disc desiccation and loss of disc height. Central protrusion. Posterior element hypertrophy. Moderate to severe stenosis. BILATERAL subarticular zone narrowing results in L2 nerve root impingement. BILATERAL foraminal narrowing, not clearly compressive.  L2-L3: Disc desiccation, slight loss of disc  height. Central disc extrusion. Posterior element hypertrophy. Severe stenosis, probable near complete block with crowding of the cauda equina nerve roots centrally. Severe BILATERAL L3 nerve root impingement. BILATERAL foraminal narrowing likely affects both L2 nerve roots.  L3-L4:  Post fusion interspace, grossly unremarkable.  L4-L5:  Post fusion interspace, grossly unremarkable.  L5-S1:  Post fusion interspace, grossly unremarkable.  IMPRESSION: Solid appearing L3-S1 arthrodesis. Some type of metallic posterior fixation has been performed.  Severe stenosis at L2-3 related to posterior element hypertrophy and central disc extrusion. BILATERAL L3 and L2 nerve root impingement are likely.  Moderate to severe stenosis L1-2, central protrusion with posterior element hypertrophy, with BILATERAL L2 nerve root impingement.  4.3 cm abdominal aortic aneurysm. Some mural thrombus is present. Recommend followup by [place] in 1 year. This recommendation follows ACR consensus guidelines: White Paper of the ACR Incidental Findings Committee II on Vascular Findings. J Am Coll Radiol 2013; 10:789-794.  Electronically Signed   By: Staci Righter M.D.   On: 03/05/2018 20:16  Complexity Note: Imaging results reviewed. Results shared with Mr. Hull, using Layman's terms.                         Meds   Current Outpatient Medications:  .  albuterol (PROVENTIL HFA;VENTOLIN HFA) 108 (90 Base) MCG/ACT inhaler, Inhale into  the lungs., Disp: , Rfl:  .  aspirin EC 81 MG tablet, Take 81 mg by mouth., Disp: , Rfl:  .  atorvastatin (LIPITOR) 40 MG tablet, Take 1 tablet (40 mg total) by mouth daily., Disp: 30 tablet, Rfl: 3 .  baclofen (LIORESAL) 10 MG tablet, TAKE 1 TABLET TWICE DAILY AS NEEDED FOR HEADACHE OR MUSCLE SPASM, Disp: , Rfl:  .  celecoxib (CELEBREX) 100 MG capsule, Take 1 capsule (100 mg total) by mouth 2 (two) times daily., Disp: 180 capsule, Rfl: 3 .  Cholecalciferol (VITAMIN D-1000 MAX ST)  1000 units tablet, Take by mouth., Disp: , Rfl:  .  Cyanocobalamin (B-12) 2000 MCG TABS, Take by mouth., Disp: , Rfl:  .  docusate sodium (COLACE) 100 MG capsule, Take 100 mg by mouth 2 (two) times daily., Disp: , Rfl:  .  FLUoxetine (PROZAC) 20 MG tablet, Take 1 tablet (20 mg total) by mouth daily., Disp: 30 tablet, Rfl: 0 .  hydrocortisone (ANUSOL-HC) 2.5 % rectal cream, Place 1 application rectally 2 (two) times daily., Disp: 30 g, Rfl: 0 .  lisinopril (PRINIVIL,ZESTRIL) 40 MG tablet, Take 1 tablet (40 mg total) by mouth daily., Disp: 30 tablet, Rfl: 0 .  mupirocin cream (BACTROBAN) 2 %, Apply 1 application topically 2 (two) times daily., Disp: 15 g, Rfl: 0 .  nitroGLYCERIN (NITROSTAT) 0.4 MG SL tablet, Place under the tongue., Disp: , Rfl:  .  [START ON 12/15/2018] oxyCODONE-acetaminophen (PERCOCET) 10-325 MG tablet, Take 1 tablet by mouth every 8 (eight) hours as needed for pain., Disp: 90 tablet, Rfl: 0 .  pregabalin (LYRICA) 75 MG capsule, Take 2 capsules (150 mg total) by mouth at bedtime as needed., Disp: 60 capsule, Rfl: 1 .  [START ON 11/14/2018] oxyCODONE-acetaminophen (PERCOCET) 10-325 MG tablet, Take 1 tablet by mouth every 8 (eight) hours as needed for pain., Disp: 90 tablet, Rfl: 0  ROS  Constitutional: Denies any fever or chills Gastrointestinal: No reported hemesis, hematochezia, vomiting, or acute GI distress Musculoskeletal: Denies any acute onset joint swelling, redness, loss of ROM, or weakness Neurological: No reported episodes of acute onset apraxia, aphasia, dysarthria, agnosia, amnesia, paralysis, loss of coordination, or loss of consciousness  Allergies  Mr. Vandenbrink is allergic to ibuprofen.  [place]  Drug: Mr. Brooks  reports that he does not use drugs. Alcohol:  reports that he does not drink alcohol. Tobacco:  reports that he has been smoking cigarettes. He has a 14.00 pack-year smoking history. He has never used smokeless tobacco. Medical:  has a past medical  history of Arthritis, Asthma, CAD (coronary artery disease), Depression, Emphysema lung ([place]), Glaucoma, and Hypertension. Surgical: Mr. Wilkerson  has a past surgical history that includes heart surgery (07/1999); Hip surgery (Left, 1968); Back surgery (1985); Skin graft (1968); and Humerus fracture surgery (1968). Family: family history includes Cancer in his father; Hearing loss in his mother; Heart attack in his brother; Heart disease in his brother; Hypertension in his father and mother; Stroke in his sister.  Constitutional Exam  General appearance: Well nourished, well developed, and well hydrated. In no apparent acute distress Vitals:   11/09/18 1326  BP: (!) 147/94  Pulse: 78  Temp: 97.8 F (36.6 C)  SpO2: 95%  Weight: 154 lb (69.9 kg)  Height: _0  (1.753 m)  Psych/Mental status: Alert, oriented x 3 (person, place, & time)       Eyes: PERLA Respiratory: No evidence of acute respiratory distress  Lumbar Spine Area Exam  Skin & Axial Inspection: No  masses, redness, or swelling Alignment: Symmetrical Functional ROM: Unrestricted ROM       Stability: No instability detected Muscle Tone/Strength: Functionally intact. No obvious neuro-muscular anomalies detected. Sensory (Neurological): Unimpaired Palpation: No palpable anomalies        Gait & Posture Assessment  Ambulation: Patient ambulates using a walker Gait: Relatively normal for age and body habitus Posture: WNL   Lower Extremity Exam    Side: Right lower extremity  Side: Left lower extremity  Stability: No instability observed          Stability: No instability observed          Skin & Extremity Inspection: Skin color, temperature, and hair growth are WNL. No peripheral edema or cyanosis. No masses, redness, swelling, asymmetry, or associated skin lesions. No contractures.  Skin & Extremity Inspection: Skin color, temperature, and hair growth are WNL. No peripheral edema or cyanosis. No masses, redness, swelling,  asymmetry, or associated skin lesions. No contractures.  Functional ROM: Unrestricted ROM                  Functional ROM: Unrestricted ROM                  Muscle Tone/Strength: Functionally intact. No obvious neuro-muscular anomalies detected.  Muscle Tone/Strength: Functionally intact. No obvious neuro-muscular anomalies detected.  Sensory (Neurological): Unimpaired        Sensory (Neurological): Unimpaired        Palpation: No palpable anomalies  Palpation: No palpable anomalies   Assessment  Primary Diagnosis & Pertinent Problem List: The primary encounter diagnosis was Chronic bilateral low back pain with bilateral sciatica. Diagnoses of Lumbar degenerative disc disease and Chronic pain syndrome were also pertinent to this visit.  Status Diagnosis  Controlled Controlled Controlled 1. Chronic bilateral low back pain with bilateral sciatica   2. Lumbar degenerative disc disease   3. Chronic pain syndrome     Problems updated and reviewed during this visit: No problems updated. Plan of Care  Pharmacotherapy (Medications Ordered): Meds ordered this encounter  Medications  . oxyCODONE-acetaminophen (PERCOCET) 10-325 MG tablet    Sig: Take 1 tablet by mouth every 8 (eight) hours as needed for pain.    Dispense:  90 tablet    Refill:  0    Do not place this medication, or any other prescription from our practice, on "Automatic Refill". Patient may have prescription filled one day early if pharmacy is closed on scheduled refill date.    Order Specific Question:   Supervising Provider    Answer:   Milinda Pointer [phone]  . pregabalin (LYRICA) 75 MG capsule    Sig: Take 2 capsules (150 mg total) by mouth at bedtime as needed.    Dispense:  60 capsule    Refill:  1    Order Specific Question:   Supervising Provider    Answer:   Milinda Pointer [phone]  . oxyCODONE-acetaminophen (PERCOCET) 10-325 MG tablet    Sig: Take 1 tablet by mouth every 8 (eight) hours as needed for  pain.    Dispense:  90 tablet    Refill:  0    Do not place this medication, or any other prescription from our practice, on "Automatic Refill". Patient may have prescription filled one day early if pharmacy is closed on scheduled refill date.    Order Specific Question:   Supervising Provider    Answer:   Milinda Pointer [[id number]]   New Prescriptions   No medications on file  Medications administered today: Justo L. Archambeau had no medications administered during this visit. Lab-work, procedure(s), and/or referral(s): No orders of the defined types were placed in this encounter.  Imaging and/or referral(s): None  Interventional therapies: Planned, scheduled, and/or pending:   Not at this time.  Provider-requested follow-up: Return in about 2 months (around 01/09/2019) for MedMgmt.  Future Appointments  Date Time Provider [place]  01/05/2019  1:45 PM Vevelyn Francois, NP [facility] None  04/09/2019 10:30 AM [facility] 2 [facility] None  04/09/2019 11:30 AM Dew, Erskine Squibb, MD [facility] None   Primary Care Physician: Patient, No Pcp Per Location: [facility] Note by: Vevelyn Francois NP Date: 11/09/2018; Time: 11:36 AM  Pain Score Disclaimer: We use the NRS-11 scale. This is a self-reported, subjective measurement of pain severity with only modest accuracy. It is used primarily to identify changes within a particular patient. It must be understood that outpatient pain scales are significantly less accurate that those used for research, where they can be applied under ideal controlled circumstances with minimal exposure to variables. In reality, the score is likely to be a combination of pain intensity and pain affect, where pain affect describes the degree of emotional arousal or changes in action readiness caused by the sensory experience of pain. Factors such as social and work situation, setting, emotional state, anxiety levels, expectation, and prior  pain experience may influence pain perception and show large inter-individual differences that may also be affected by time variables.  Patient instructions provided during this appointment: Patient Instructions   _You have been given prescription for Oxycodone (Percocet) to 2 months (01/04/2019). Also Rx for Lyrica sent to pharmacy.  ___________________________________________________________________________________________  Medication Rules  Purpose: To inform patients, and their family members, of our rules and regulations.  Applies to: All patients receiving prescriptions (written or electronic).  Pharmacy of record: Pharmacy where electronic prescriptions will be sent. If written prescriptions are taken to a different pharmacy, please inform the nursing staff. The pharmacy listed in the electronic medical record should be the one where you would like electronic prescriptions to be sent.  Electronic prescriptions: In compliance with the [place] (STOP) Act of 2017 (Session Lanny Cramp [phone]), effective December 16, 2018, all controlled substances must be electronically prescribed. Calling prescriptions to the pharmacy will cease to exist.  Prescription refills: Only during scheduled appointments. Applies to all prescriptions.  NOTE: The following applies primarily to controlled substances (Opioid* Pain Medications).   Patient's responsibilities: 1. Pain Pills: Bring all pain pills to every appointment (except for procedure appointments). 2. Pill Bottles: Bring pills in original pharmacy bottle. Always bring the newest bottle. Bring bottle, even if empty. 3. Medication refills: You are responsible for knowing and keeping track of what medications you take and those you need refilled. The day before your appointment: write a list of all prescriptions that need to be refilled. The day of the appointment: give the list to the admitting nurse.  Prescriptions will be written only during appointments. If you forget a medication: it will not be "Called in", "Faxed", or "electronically sent". You will need to get another appointment to get these prescribed. No early refills. Do not call asking to have your prescription filled early. 4. Prescription Accuracy: You are responsible for carefully inspecting your prescriptions before leaving our office. Have the discharge nurse carefully go over each prescription with you, before taking them home. Make sure that your name is accurately spelled, that your  address is correct. Check the name and dose of your medication to make sure it is accurate. Check the number of pills, and the written instructions to make sure they are clear and accurate. Make sure that you are given enough medication to last until your next medication refill appointment. 5. Taking Medication: Take medication as prescribed. When it comes to controlled substances, taking less pills or less frequently than prescribed is permitted and encouraged. Never take more pills than instructed. Never take medication more frequently than prescribed.  6. Inform other Doctors: Always inform, all of your healthcare providers, of all the medications you take. 7. Pain Medication from other Providers: You are not allowed to accept any additional pain medication from any other Doctor or Healthcare provider. There are two exceptions to this rule. (see below) In the event that you require additional pain medication, you are responsible for notifying us, as stated below. 8. Medication Agreement: You are responsible for carefully reading and following our Medication Agreement. This must be signed before receiving any prescriptions from our practice. Safely store a copy of your signed Agreement. Violations to the Agreement will result in no further prescriptions. (Additional copies of our Medication Agreement are available upon request.) 9. Laws, Rules, &  Regulations: All patients are expected to follow all Federal and [employer], [employer], Rules, [employer]. Ignorance of the Laws does not constitute a valid excuse. The use of any illegal substances is prohibited. 10. Adopted CDC guidelines & recommendations: Target dosing levels will be at or below 60 MME/day. Use of benzodiazepines** is not recommended.  Exceptions: There are only two exceptions to the rule of not receiving pain medications from other Healthcare Providers. 1. Exception #1 (Emergencies): In the event of an emergency (i.e.: accident requiring emergency care), you are allowed to receive additional pain medication. However, you are responsible for: As soon as you are able, call our office [phone], at any time of the day or night, and leave a message stating your name, the date and nature of the emergency, and the name and dose of the medication prescribed. In the event that your call is answered by a member of our staff, make sure to document and save the date, time, and the name of the person that took your information.  2. Exception #2 (Planned Surgery): In the event that you are scheduled by another doctor or dentist to have any type of surgery or procedure, you are allowed (for a period no longer than 30 days), to receive additional pain medication, for the acute post-op pain. However, in this case, you are responsible for picking up a copy of our "Post-op Pain Management for Surgeons" handout, and giving it to your surgeon or dentist. This document is available at our office, and does not require an appointment to obtain it. Simply go to our office during business hours (Monday-Thursday from 8:00 AM to 4:00 PM) (Friday 8:00 AM to 12:00 Noon) or if you have a scheduled appointment with [place], prior to your surgery, and ask for it by name. In addition, you will need to provide [place] with your name, name of your surgeon, type of surgery, and date of procedure or surgery.  *Opioid  medications include: morphine, codeine, oxycodone, oxymorphone, hydrocodone, hydromorphone, meperidine, tramadol, tapentadol, buprenorphine, fentanyl, methadone. **Benzodiazepine medications include: diazepam (Valium), alprazolam (Xanax), clonazepam (Klonopine), lorazepam (Ativan), clorazepate (Tranxene), chlordiazepoxide (Librium), estazolam (Prosom), oxazepam (Serax), temazepam (Restoril), triazolam (Halcion) (Last updated: 02/12/2018) ____________________________________________________________________________________________   ____________________________________________________________________________________________  Appointment Policy Summary  It  is our goal and responsibility to provide the medical community with assistance in the evaluation and management of patients with chronic pain. Unfortunately our resources are limited. Because we do not have an unlimited amount of time, or available appointments, we are required to closely monitor and manage their use. The following rules exist to maximize their use:  Patient's responsibilities: 1. Punctuality:  At what time should I arrive? You should be physically present in our office 30 minutes before your scheduled appointment. Your scheduled appointment is with your assigned healthcare provider. However, it takes 5-10 minutes to be "checked-in", and another 15 minutes for the nurses to do the admission. If you arrive to our office at the time you were given for your appointment, you will end up being at least 20-25 minutes late to your appointment with the provider. 2. Tardiness:  What happens if I arrive only a few minutes after my scheduled appointment time? You will need to reschedule your appointment. The cutoff is your appointment time. This is why it is so important that you arrive at least 30 minutes before that appointment. If you have an appointment scheduled for 10:00 AM and you arrive at 10:01, you will be required to reschedule your  appointment.  3. Plan ahead:  Always assume that you will encounter traffic on your way in. Plan for it. If you are dependent on a driver, make sure they understand these rules and the need to arrive early. 4. Other appointments and responsibilities:  Avoid scheduling any other appointments before or after your pain clinic appointments.  5. Be prepared:  Write down everything that you need to discuss with your healthcare provider and give this information to the admitting nurse. Write down the medications that you will need refilled. Bring your pills and bottles (even the empty ones), to all of your appointments, except for those where a procedure is scheduled. 6. No children or pets:  Find someone to take care of them. It is not appropriate to bring them in. 7. Scheduling changes:  We request "advanced notification" of any changes or cancellations. 8. Advanced notification:  Defined as a time period of more than 24 hours prior to the originally scheduled appointment. This allows for the appointment to be offered to other patients. 9. Rescheduling:  When a visit is rescheduled, it will require the cancellation of the original appointment. For this reason they both fall within the category of "Cancellations".  10. Cancellations:  They require advanced notification. Any cancellation less than 24 hours before the  appointment will be recorded as a "No Show". 11. No Show:  Defined as an unkept appointment where the patient failed to notify or declare to the practice their intention or inability to keep the appointment.  Corrective process for repeat offenders:  1. Tardiness: Three (3) episodes of rescheduling due to late arrivals will be recorded as one (1) "No Show". 2. Cancellation or reschedule: Three (3) cancellations or rescheduling will be recorded as one (1) "No Show". 3. "No Shows": Three (3) "No Shows" within a 12 month period will result in discharge from the  practice. ____________________________________________________________________________________________   ______________________________________________________________________________________________  Specialty Pain Scale  Introduction:  There are significant differences in how pain is reported. The word pain usually refers to physical pain, but it is also a common synonym of suffering. The medical community uses a scale from 0 (zero) to 10 (ten) to report pain level. Zero (0) is described as "no pain", while ten (10) is described as "  the worse pain you can imagine". The problem with this scale is that physical pain is reported along with suffering. Suffering refers to mental pain, or more often yet it refers to any unpleasant feeling, emotion or aversion associated with the perception of harm or threat of harm. It is the psychological component of pain.  Pain Specialists prefer to separate the two components. The pain scale used by this practice is the Verbal Numerical Rating Scale (VNRS-11). This scale is for the physical pain only. DO NOT INCLUDE how your pain psychologically affects you. This scale is for adults 57 years of age and older. It has 11 (eleven) levels. The 1st level is 0/10. This means: "right now, I have no pain". In the context of pain management, it also means: "right now, my physical pain is under control with the current therapy".  General Information:  The scale should reflect your current level of pain. Unless you are specifically asked for the level of your worst pain, or your average pain. If you are asked for one of these two, then it should be understood that it is over the past 24 hours.  Levels 1 (one) through 5 (five) are described below, and can be treated as an outpatient. Ambulatory pain management facilities such as ours are more than adequate to treat these levels. Levels 6 (six) through 10 (ten) are also described below, however, these must be treated as a  hospitalized patient. While levels 6 (six) and 7 (seven) may be evaluated at an urgent care facility, levels 8 (eight) through 10 (ten) constitute medical emergencies and as such, they belong in a hospital's emergency department. When having these levels (as described below), do not come to our office. Our facility is not equipped to manage these levels. Go directly to an urgent care facility or an emergency department to be evaluated.  Definitions:  Activities of Daily Living (ADL): Activities of daily living (ADL or ADLs) is a term used in healthcare to refer to people's daily self-care activities. Health professionals often use a person's ability or inability to perform ADLs as a measurement of their functional status, particularly in regard to people post injury, with disabilities and the elderly. There are two ADL levels: Basic and Instrumental. Basic Activities of Daily Living (BADL  or BADLs) consist of self-care tasks that include: Bathing and showering; personal hygiene and grooming (including brushing/combing/styling hair); dressing; Toilet hygiene (getting to the toilet, cleaning oneself, and getting back up); eating and self-feeding (not including cooking or chewing and swallowing); functional mobility, often referred to as "transferring", as measured by the ability to walk, get in and out of bed, and get into and out of a chair; the broader definition (moving from one place to another while performing activities) is useful for people with different physical abilities who are still able to get around independently. Basic ADLs include the things many people do when they get up in the morning and get ready to go out of the house: get out of bed, go to the toilet, bathe, dress, groom, and eat. On the average, loss of function typically follows a particular order. Hygiene is the first to go, followed by loss of toilet use and locomotion. The last to go is the ability to eat. When there is only one  remaining area in which the person is independent, there is a 62.9% chance that it is eating and only a 3.5% chance that it is hygiene. Instrumental Activities of Daily Living (IADL or IADLs)  are not necessary for fundamental functioning, but they let an individual live independently in a community. IADL consist of tasks that include: cleaning and maintaining the house; home establishment and maintenance; care of others (including selecting and supervising caregivers); care of pets; child rearing; managing money; managing financials (investments, etc.); meal preparation and cleanup; shopping for groceries and necessities; moving within the community; safety procedures and emergency responses; health management and maintenance (taking prescribed medications); and using the telephone or other form of communication.  Instructions:  Most patients tend to report their pain as a combination of two factors, their physical pain and their psychosocial pain. This last one is also known as "suffering" and it is reflection of how physical pain affects you socially and psychologically. From now on, report them separately.  From this point on, when asked to report your pain level, report only your physical pain. Use the following table for reference.  Pain Clinic Pain Levels (0-5/10)  Pain Level Score  Description  No Pain 0   Mild pain 1 Nagging, annoying, but does not interfere with basic activities of daily living (ADL). Patients are able to eat, bathe, get dressed, toileting (being able to get on and off the toilet and perform personal hygiene functions), transfer (move in and out of bed or a chair without assistance), and maintain continence (able to control bladder and bowel functions). Blood pressure and heart rate are unaffected. A normal heart rate for a healthy adult ranges from 60 to 100 bpm (beats per minute).   Mild to moderate pain 2 Noticeable and distracting. Impossible to hide from other people. More  frequent flare-ups. Still possible to adapt and function close to normal. It can be very annoying and may have occasional stronger flare-ups. With discipline, patients may get used to it and adapt.   Moderate pain 3 Interferes significantly with activities of daily living (ADL). It becomes difficult to feed, bathe, get dressed, get on and off the toilet or to perform personal hygiene functions. Difficult to get in and out of bed or a chair without assistance. Very distracting. With effort, it can be ignored when deeply involved in activities.   Moderately severe pain 4 Impossible to ignore for more than a few minutes. With effort, patients may still be able to manage work or participate in some social activities. Very difficult to concentrate. Signs of autonomic nervous system discharge are evident: dilated pupils (mydriasis); mild sweating (diaphoresis); sleep interference. Heart rate becomes elevated (>115 bpm). Diastolic blood pressure (lower number) rises above 100 mmHg. Patients find relief in laying down and not moving.   Severe pain 5 Intense and extremely unpleasant. Associated with frowning face and frequent crying. Pain overwhelms the senses.  Ability to do any activity or maintain social relationships becomes significantly limited. Conversation becomes difficult. Pacing back and forth is common, as getting into a comfortable position is nearly impossible. Pain wakes you up from deep sleep. Physical signs will be obvious: pupillary dilation; increased sweating; goosebumps; brisk reflexes; cold, clammy hands and feet; nausea, vomiting or dry heaves; loss of appetite; significant sleep disturbance with inability to fall asleep or to remain asleep. When persistent, significant weight loss is observed due to the complete loss of appetite and sleep deprivation.  Blood pressure and heart rate becomes significantly elevated. Caution: If elevated blood pressure triggers a pounding headache associated with  blurred vision, then the patient should immediately seek attention at an urgent or emergency care unit, as these may be signs of an  impending stroke.    Emergency Department Pain Levels (6-10/10)  Emergency Room Pain 6 Severely limiting. Requires emergency care and should not be seen or managed at an outpatient pain management facility. Communication becomes difficult and requires great effort. Assistance to reach the emergency department may be required. Facial flushing and profuse sweating along with potentially dangerous increases in heart rate and blood pressure will be evident.   Distressing pain 7 Self-care is very difficult. Assistance is required to transport, or use restroom. Assistance to reach the emergency department will be required. Tasks requiring coordination, such as bathing and getting dressed become very difficult.   Disabling pain 8 Self-care is no longer possible. At this level, pain is disabling. The individual is unable to do even the most "basic" activities such as walking, eating, bathing, dressing, transferring to a bed, or toileting. Fine motor skills are lost. It is difficult to think clearly.   Incapacitating pain 9 Pain becomes incapacitating. Thought processing is no longer possible. Difficult to remember your own name. Control of movement and coordination are lost.   The worst pain imaginable 10 At this level, most patients pass out from pain. When this level is reached, collapse of the autonomic nervous system occurs, leading to a sudden drop in blood pressure and heart rate. This in turn results in a temporary and dramatic drop in blood flow to the brain, leading to a loss of consciousness. Fainting is one of the body's self defense mechanisms. Passing out puts the brain in a calmed state and causes it to shut down for a while, in order to begin the healing process.    Summary: 1. Refer to this scale when providing [place] with your pain level. 2. Be accurate and careful  when reporting your pain level. This will help with your care. 3. Over-reporting your pain level will lead to loss of credibility. 4. Even a level of 1/10 means that there is pain and will be treated at our facility. 5. High, inaccurate reporting will be documented as "Symptom Exaggeration", leading to loss of credibility and suspicions of possible secondary gains such as obtaining more narcotics, or wanting to appear disabled, for fraudulent reasons. 6. Only pain levels of 5 or below will be seen at our facility. 7. Pain levels of 6 and above will be sent to the Emergency Department and the appointment cancelled. ______________________________________________________________________________________________

## 2018-11-09 NOTE — Patient Instructions (Addendum)
_You have been given prescription for Oxycodone (Percocet) to 2 months (01/04/2019). Also Rx for Lyrica sent to pharmacy.  ___________________________________________________________________________________________  Medication Rules  Purpose: To inform patients, and their family members, of our rules and regulations.  Applies to: All patients receiving prescriptions (written or electronic).  Pharmacy of record: Pharmacy where electronic prescriptions will be sent. If written prescriptions are taken to a different pharmacy, please inform the nursing staff. The pharmacy listed in the electronic medical record should be the one where you would like electronic prescriptions to be sent.  Electronic prescriptions: In compliance with the [facility] Strengthen Opioid Misuse Prevention (STOP) Act of 2017 (Session Conni Elliot [phone]), effective December 16, 2018, all controlled substances must be electronically prescribed. Calling prescriptions to the pharmacy will cease to exist.  Prescription refills: Only during scheduled appointments. Applies to all prescriptions.  NOTE: The following applies primarily to controlled substances (Opioid* Pain Medications).   Patient's responsibilities: 1. Pain Pills: Bring all pain pills to every appointment (except for procedure appointments). 2. Pill Bottles: Bring pills in original pharmacy bottle. Always bring the newest bottle. Bring bottle, even if empty. 3. Medication refills: You are responsible for knowing and keeping track of what medications you take and those you need refilled. The day before your appointment: write a list of all prescriptions that need to be refilled. The day of the appointment: give the list to the admitting nurse. Prescriptions will be written only during appointments. If you forget a medication: it will not be "Called in", "Faxed", or "electronically sent". You will need to get another appointment to get these prescribed. No early  refills. Do not call asking to have your prescription filled early. 4. Prescription Accuracy: You are responsible for carefully inspecting your prescriptions before leaving our office. Have the discharge nurse carefully go over each prescription with you, before taking them home. Make sure that your name is accurately spelled, that your address is correct. Check the name and dose of your medication to make sure it is accurate. Check the number of pills, and the written instructions to make sure they are clear and accurate. Make sure that you are given enough medication to last until your next medication refill appointment. 5. Taking Medication: Take medication as prescribed. When it comes to controlled substances, taking less pills or less frequently than prescribed is permitted and encouraged. Never take more pills than instructed. Never take medication more frequently than prescribed.  6. Inform other Doctors: Always inform, all of your healthcare providers, of all the medications you take. 7. Pain Medication from other Providers: You are not allowed to accept any additional pain medication from any other Doctor or Healthcare provider. There are two exceptions to this rule. (see below) In the event that you require additional pain medication, you are responsible for notifying us, as stated below. 8. Medication Agreement: You are responsible for carefully reading and following our Medication Agreement. This must be signed before receiving any prescriptions from our practice. Safely store a copy of your signed Agreement. Violations to the Agreement will result in no further prescriptions. (Additional copies of our Medication Agreement are available upon request.) 9. Laws, Rules, & Regulations: All patients are expected to follow all [address] and [employer], Rules, [place] [employer]. Ignorance of the Laws does not constitute a valid excuse. The use of any illegal substances is prohibited. 10. Adopted  CDC guidelines & recommendations: Target dosing levels will be at or below 60 MME/day. Use of benzodiazepines** is not recommended.  Exceptions: There are only two exceptions to the rule of not receiving pain medications from other Healthcare Providers. 1. Exception #1 (Emergencies): In the event of an emergency (i.e.: accident requiring emergency care), you are allowed to receive additional pain medication. However, you are responsible for: As soon as you are able, call our office [phone], at any time of the day or night, and leave a message stating your name, the date and nature of the emergency, and the name and dose of the medication prescribed. In the event that your call is answered by a member of our staff, make sure to document and save the date, time, and the name of the person that took your information.  2. Exception #2 (Planned Surgery): In the event that you are scheduled by another doctor or dentist to have any type of surgery or procedure, you are allowed (for a period no longer than 30 days), to receive additional pain medication, for the acute post-op pain. However, in this case, you are responsible for picking up a copy of our "Post-op Pain Management for Surgeons" handout, and giving it to your surgeon or dentist. This document is available at our office, and does not require an appointment to obtain it. Simply go to our office during business hours (Monday-Thursday from 8:00 AM to 4:00 PM) (Friday 8:00 AM to 12:00 Noon) or if you have a scheduled appointment with [place], prior to your surgery, and ask for it by name. In addition, you will need to provide [place] with your name, name of your surgeon, type of surgery, and date of procedure or surgery.  *Opioid medications include: morphine, codeine, oxycodone, oxymorphone, hydrocodone, hydromorphone, meperidine, tramadol, tapentadol, buprenorphine, fentanyl, methadone. **Benzodiazepine medications include: diazepam (Valium), alprazolam  (Xanax), clonazepam (Klonopine), lorazepam (Ativan), clorazepate (Tranxene), chlordiazepoxide (Librium), estazolam (Prosom), oxazepam (Serax), temazepam (Restoril), triazolam (Halcion) (Last updated: 02/12/2018) ____________________________________________________________________________________________   ____________________________________________________________________________________________  Appointment Policy Summary  It is our goal and responsibility to provide the medical community with assistance in the evaluation and management of patients with chronic pain. Unfortunately our resources are limited. Because we do not have an unlimited amount of time, or available appointments, we are required to closely monitor and manage their use. The following rules exist to maximize their use:  Patient's responsibilities: 1. Punctuality:  At what time should I arrive? You should be physically present in our office 30 minutes before your scheduled appointment. Your scheduled appointment is with your assigned healthcare provider. However, it takes 5-10 minutes to be "checked-in", and another 15 minutes for the nurses to do the admission. If you arrive to our office at the time you were given for your appointment, you will end up being at least 20-25 minutes late to your appointment with the provider. 2. Tardiness:  What happens if I arrive only a few minutes after my scheduled appointment time? You will need to reschedule your appointment. The cutoff is your appointment time. This is why it is so important that you arrive at least 30 minutes before that appointment. If you have an appointment scheduled for 10:00 AM and you arrive at 10:01, you will be required to reschedule your appointment.  3. Plan ahead:  Always assume that you will encounter traffic on your way in. Plan for it. If you are dependent on a driver, make sure they understand these rules and the need to arrive early. 4. Other appointments  and responsibilities:  Avoid scheduling any other appointments before or after your pain clinic appointments.  5.  Be prepared:  Write down everything that you need to discuss with your healthcare provider and give this information to the admitting nurse. Write down the medications that you will need refilled. Bring your pills and bottles (even the empty ones), to all of your appointments, except for those where a procedure is scheduled. 6. No children or pets:  Find someone to take care of them. It is not appropriate to bring them in. 7. Scheduling changes:  We request "advanced notification" of any changes or cancellations. 8. Advanced notification:  Defined as a time period of more than 24 hours prior to the originally scheduled appointment. This allows for the appointment to be offered to other patients. 9. Rescheduling:  When a visit is rescheduled, it will require the cancellation of the original appointment. For this reason they both fall within the category of "Cancellations".  10. Cancellations:  They require advanced notification. Any cancellation less than 24 hours before the  appointment will be recorded as a "No Show". 11. No Show:  Defined as an unkept appointment where the patient failed to notify or declare to the practice their intention or inability to keep the appointment.  Corrective process for repeat offenders:  1. Tardiness: Three (3) episodes of rescheduling due to late arrivals will be recorded as one (1) "No Show". 2. Cancellation or reschedule: Three (3) cancellations or rescheduling will be recorded as one (1) "No Show". 3. "No Shows": Three (3) "No Shows" within a 12 month period will result in discharge from the practice. ____________________________________________________________________________________________   ______________________________________________________________________________________________  Specialty Pain Scale  Introduction:  There are  significant differences in how pain is reported. The word pain usually refers to physical pain, but it is also a common synonym of suffering. The medical community uses a scale from 0 (zero) to 10 (ten) to report pain level. Zero (0) is described as "no pain", while ten (10) is described as "the worse pain you can imagine". The problem with this scale is that physical pain is reported along with suffering. Suffering refers to mental pain, or more often yet it refers to any unpleasant feeling, emotion or aversion associated with the perception of harm or threat of harm. It is the psychological component of pain.  Pain Specialists prefer to separate the two components. The pain scale used by this practice is the Verbal Numerical Rating Scale (VNRS-11). This scale is for the physical pain only. DO NOT INCLUDE how your pain psychologically affects you. This scale is for adults 10 years of age and older. It has 11 (eleven) levels. The 1st level is 0/10. This means: "right now, I have no pain". In the context of pain management, it also means: "right now, my physical pain is under control with the current therapy".  General Information:  The scale should reflect your current level of pain. Unless you are specifically asked for the level of your worst pain, or your average pain. If you are asked for one of these two, then it should be understood that it is over the past 24 hours.  Levels 1 (one) through 5 (five) are described below, and can be treated as an outpatient. Ambulatory pain management facilities such as ours are more than adequate to treat these levels. Levels 6 (six) through 10 (ten) are also described below, however, these must be treated as a hospitalized patient. While levels 6 (six) and 7 (seven) may be evaluated at an urgent care facility, levels 8 (eight) through 10 (ten) constitute medical emergencies  and as such, they belong in a hospital's emergency department. When having these levels (as  described below), do not come to our office. Our facility is not equipped to manage these levels. Go directly to an urgent care facility or an emergency department to be evaluated.  Definitions:  Activities of Daily Living (ADL): Activities of daily living (ADL or ADLs) is a term used in healthcare to refer to people's daily self-care activities. Health professionals often use a person's ability or inability to perform ADLs as a measurement of their functional status, particularly in regard to people post injury, with disabilities and the elderly. There are two ADL levels: Basic and Instrumental. Basic Activities of Daily Living (BADL  or BADLs) consist of self-care tasks that include: Bathing and showering; personal hygiene and grooming (including brushing/combing/styling hair); dressing; Toilet hygiene (getting to the toilet, cleaning oneself, and getting back up); eating and self-feeding (not including cooking or chewing and swallowing); functional mobility, often referred to as "transferring", as measured by the ability to walk, get in and out of bed, and get into and out of a chair; the broader definition (moving from one place to another while performing activities) is useful for people with different physical abilities who are still able to get around independently. Basic ADLs include the things many people do when they get up in the morning and get ready to go out of the house: get out of bed, go to the toilet, bathe, dress, groom, and eat. On the average, loss of function typically follows a particular order. Hygiene is the first to go, followed by loss of toilet use and locomotion. The last to go is the ability to eat. When there is only one remaining area in which the person is independent, there is a 62.9% chance that it is eating and only a 3.5% chance that it is hygiene. Instrumental Activities of Daily Living (IADL or IADLs) are not necessary for fundamental functioning, but they let an  individual live independently in a community. IADL consist of tasks that include: cleaning and maintaining the house; home establishment and maintenance; care of others (including selecting and supervising caregivers); care of pets; child rearing; managing money; managing financials (investments, etc.); meal preparation and cleanup; shopping for groceries and necessities; moving within the community; safety procedures and emergency responses; health management and maintenance (taking prescribed medications); and using the telephone or other form of communication.  Instructions:  Most patients tend to report their pain as a combination of two factors, their physical pain and their psychosocial pain. This last one is also known as "suffering" and it is reflection of how physical pain affects you socially and psychologically. From now on, report them separately.  From this point on, when asked to report your pain level, report only your physical pain. Use the following table for reference.  Pain Clinic Pain Levels (0-5/10)  Pain Level Score  Description  No Pain 0   Mild pain 1 Nagging, annoying, but does not interfere with basic activities of daily living (ADL). Patients are able to eat, bathe, get dressed, toileting (being able to get on and off the toilet and perform personal hygiene functions), transfer (move in and out of bed or a chair without assistance), and maintain continence (able to control bladder and bowel functions). Blood pressure and heart rate are unaffected. A normal heart rate for a healthy adult ranges from 60 to 100 bpm (beats per minute).   Mild to moderate pain 2 Noticeable and distracting. Impossible  to hide from other people. More frequent flare-ups. Still possible to adapt and function close to normal. It can be very annoying and may have occasional stronger flare-ups. With discipline, patients may get used to it and adapt.   Moderate pain 3 Interferes significantly with  activities of daily living (ADL). It becomes difficult to feed, bathe, get dressed, get on and off the toilet or to perform personal hygiene functions. Difficult to get in and out of bed or a chair without assistance. Very distracting. With effort, it can be ignored when deeply involved in activities.   Moderately severe pain 4 Impossible to ignore for more than a few minutes. With effort, patients may still be able to manage work or participate in some social activities. Very difficult to concentrate. Signs of autonomic nervous system discharge are evident: dilated pupils (mydriasis); mild sweating (diaphoresis); sleep interference. Heart rate becomes elevated (>115 bpm). Diastolic blood pressure (lower number) rises above 100 mmHg. Patients find relief in laying down and not moving.   Severe pain 5 Intense and extremely unpleasant. Associated with frowning face and frequent crying. Pain overwhelms the senses.  Ability to do any activity or maintain social relationships becomes significantly limited. Conversation becomes difficult. Pacing back and forth is common, as getting into a comfortable position is nearly impossible. Pain wakes you up from deep sleep. Physical signs will be obvious: pupillary dilation; increased sweating; goosebumps; brisk reflexes; cold, clammy hands and feet; nausea, vomiting or dry heaves; loss of appetite; significant sleep disturbance with inability to fall asleep or to remain asleep. When persistent, significant weight loss is observed due to the complete loss of appetite and sleep deprivation.  Blood pressure and heart rate becomes significantly elevated. Caution: If elevated blood pressure triggers a pounding headache associated with blurred vision, then the patient should immediately seek attention at an urgent or emergency care unit, as these may be signs of an impending stroke.    Emergency Department Pain Levels (6-10/10)  Emergency Room Pain 6 Severely limiting.  Requires emergency care and should not be seen or managed at an outpatient pain management facility. Communication becomes difficult and requires great effort. Assistance to reach the emergency department may be required. Facial flushing and profuse sweating along with potentially dangerous increases in heart rate and blood pressure will be evident.   Distressing pain 7 Self-care is very difficult. Assistance is required to transport, or use restroom. Assistance to reach the emergency department will be required. Tasks requiring coordination, such as bathing and getting dressed become very difficult.   Disabling pain 8 Self-care is no longer possible. At this level, pain is disabling. The individual is unable to do even the most "basic" activities such as walking, eating, bathing, dressing, transferring to a bed, or toileting. Fine motor skills are lost. It is difficult to think clearly.   Incapacitating pain 9 Pain becomes incapacitating. Thought processing is no longer possible. Difficult to remember your own name. Control of movement and coordination are lost.   The worst pain imaginable 10 At this level, most patients pass out from pain. When this level is reached, collapse of the autonomic nervous system occurs, leading to a sudden drop in blood pressure and heart rate. This in turn results in a temporary and dramatic drop in blood flow to the brain, leading to a loss of consciousness. Fainting is one of the body's self defense mechanisms. Passing out puts the brain in a calmed state and causes it to shut down for a while,  in order to begin the healing process.    Summary: 1. Refer to this scale when providing [place] with your pain level. 2. Be accurate and careful when reporting your pain level. This will help with your care. 3. Over-reporting your pain level will lead to loss of credibility. 4. Even a level of 1/10 means that there is pain and will be treated at our facility. 5. High, inaccurate  reporting will be documented as "Symptom Exaggeration", leading to loss of credibility and suspicions of possible secondary gains such as obtaining more narcotics, or wanting to appear disabled, for fraudulent reasons. 6. Only pain levels of 5 or below will be seen at our facility. 7. Pain levels of 6 and above will be sent to the Emergency Department and the appointment cancelled. ______________________________________________________________________________________________

## 2018-12-13 ENCOUNTER — Other Ambulatory Visit: Payer: Self-pay | Admitting: Family Medicine

## 2018-12-14 ENCOUNTER — Other Ambulatory Visit: Payer: Self-pay | Admitting: Family Medicine

## 2019-01-05 ENCOUNTER — Other Ambulatory Visit: Payer: Self-pay

## 2019-01-05 ENCOUNTER — Ambulatory Visit: Payer: Medicare HMO | Attending: Nurse Practitioner | Admitting: Nurse Practitioner

## 2019-01-05 ENCOUNTER — Encounter: Payer: Self-pay | Admitting: Nurse Practitioner

## 2019-01-05 VITALS — BP 135/84 | HR 75 | Temp 98.2°F | Resp 16 | Ht 69.0 in | Wt 150.0 lb

## 2019-01-05 DIAGNOSIS — M5442 Lumbago with sciatica, left side: Secondary | ICD-10-CM | POA: Insufficient documentation

## 2019-01-05 DIAGNOSIS — G8929 Other chronic pain: Secondary | ICD-10-CM | POA: Diagnosis present

## 2019-01-05 DIAGNOSIS — Z79891 Long term (current) use of opiate analgesic: Secondary | ICD-10-CM | POA: Diagnosis not present

## 2019-01-05 DIAGNOSIS — M5136 Other intervertebral disc degeneration, lumbar region: Secondary | ICD-10-CM | POA: Diagnosis present

## 2019-01-05 DIAGNOSIS — M5441 Lumbago with sciatica, right side: Secondary | ICD-10-CM | POA: Insufficient documentation

## 2019-01-05 DIAGNOSIS — G894 Chronic pain syndrome: Secondary | ICD-10-CM | POA: Insufficient documentation

## 2019-01-05 DIAGNOSIS — M25552 Pain in left hip: Secondary | ICD-10-CM | POA: Insufficient documentation

## 2019-01-05 MED ORDER — OXYCODONE-ACETAMINOPHEN 10-325 MG PO TABS: 1.0000 | ORAL_TABLET | Freq: Three times a day (TID) | ORAL | 0 refills | Status: DC | PRN

## 2019-01-05 MED ORDER — PREGABALIN 75 MG PO CAPS: 150.0000 mg | ORAL_CAPSULE | Freq: Every evening | ORAL | 1 refills | Status: DC | PRN

## 2019-01-05 NOTE — Progress Notes (Signed)
Nursing Pain Medication Assessment:  Safety precautions to be maintained throughout the outpatient stay will include: orient to surroundings, keep bed in low position, maintain call bell within reach at all times, provide assistance with transfer out of bed and ambulation.  Medication Inspection Compliance: Pill count conducted under aseptic conditions, in front of the patient. Neither the pills nor the bottle was removed from the patient's sight at any time. Once count was completed pills were immediately returned to the patient in their original bottle.  Medication: Oxycodone/APAP Pill/Patch Count: 0 of 90 pills remain Pill/Patch Appearance: Markings consistent with prescribed medication Bottle Appearance: Standard pharmacy container. Clearly labeled. Filled Date: 66 / 31 / 2019 Last Medication intake:  Today   Patient states he is not taking his medications like ordered. "I have been hurting a lot"

## 2019-01-05 NOTE — Progress Notes (Addendum)
Patient's Name: Logan Vazquez  MRN: [id number]  Referring Provider: No ref. provider found  DOB: 12/10/1946  PCP: Patient, No Pcp Per  DOS: 01/05/2019  Note by: Vevelyn Francois NP  Service setting: Ambulatory outpatient  Specialty: Interventional Pain Management  Location: [facility] (AMB) Pain Management Facility    Patient type: Established    Primary Reason(s) for Visit: Encounter for prescription drug management. (Level of risk: moderate)  CC: Back Pain (lower)  HPI  Logan Vazquez is a 73 y.o. year old, male patient, who comes today for a medication management evaluation. He has Essential hypertension; Tobacco dependence; Abdominal aortic aneurysm (AAA) without rupture ([place]); Back pain; Depression, recurrent ([place]); COPD (chronic obstructive pulmonary disease) ([place]); Anxiety disorder; Atherosclerotic heart disease of native coronary artery without angina pectoris; History of CVA (cerebrovascular accident); Acquired right foot drop; History of lumbar spinal fusion; Lumbar degenerative disc disease; At high risk for falls; Chronic nausea; Chronic prescription opiate use; Descending thoracic aortic aneurysm ([place]); Enthesopathy of knee; GERD (gastroesophageal reflux disease); Ischemic stroke ([place]); Major depressive disorder, single episode; Pain medication agreement; Recurrent major depressive disorder, in partial remission ([place]); Stable angina ([place]); Tobacco use disorder; Chronic pain syndrome; Long term current use of opiate analgesic; Chronic bilateral low back pain with bilateral sciatica; and Chronic hip pain, left on their problem list. His primarily concern today is the Back Pain (lower)  Pain Assessment: Location: Left, Lower Back Radiating: hip buttocks bilateral and down back of left leg Onset: More than a month ago Duration: Chronic pain Quality: Aching, Constant, Discomfort Severity: 8 /10 (subjective, self-reported pain score)  Note: Reported level is compatible with observation. Clinically the  patient looks like a 3/10 A 3/10 is viewed as "Moderate" and described as significantly interfering with activities of daily living (ADL). It becomes difficult to feed, bathe, get dressed, get on and off the toilet or to perform personal hygiene functions. Difficult to get in and out of bed or a chair without assistance. Very distracting. With effort, it can be ignored when deeply involved in activities. Discrepancy may suggest symptom exaggeration. When using our objective Pain Scale, levels between 6 and 10/10 are said to belong in an emergency room, as it progressively worsens from a 6/10, described as severely limiting, requiring emergency care not usually available at an outpatient pain management facility. At a 6/10 level, communication becomes difficult and requires great effort. Assistance to reach the emergency department may be required. Facial flushing and profuse sweating along with potentially dangerous increases in heart rate and blood pressure will be evident. Effect on ADL: ADLs. prolonged walking, standing Timing: Constant Modifying factors:  medications,cold, heat BP: 135/84  HR: 75  Logan Vazquez was last scheduled for an appointment on 11/09/2018 for medication management. During today's appointment we reviewed Logan Vazquez chronic pain status, as well as his outpatient medication regimen. He has had several falls. He did not seek treatment. He has a history of left hip fracture with repair and revision hardware removal.   The patient  reports no history of drug use. His body mass index is 22.15 kg/m.  Further details on both, my assessment(s), as well as the proposed treatment plan, please see below.  Controlled Substance Pharmacotherapy Assessment REMS (Risk Evaluation and Mitigation Strategy)  Analgesic:Percocet 10 mg 3 times daily as needed, quantity 90/month MME/day:31m/day GIgnatius Specking Vazquez  01/06/2019  9:29 AM  Signed Nursing Pain Medication Assessment:  Safety  precautions to be maintained throughout the outpatient stay will include: orient  to surroundings, keep bed in low position, maintain call bell within reach at all times, provide assistance with transfer out of bed and ambulation.  Medication Inspection Compliance: Pill count conducted under aseptic conditions, in front of the patient. Neither the pills nor the bottle was removed from the patient's sight at any time. Once count was completed pills were immediately returned to the patient in their original bottle.  Medication: Oxycodone/APAP Pill/Patch Count: 0 of 90 pills remain Pill/Patch Appearance: Markings consistent with prescribed medication Bottle Appearance: Standard pharmacy container. Clearly labeled. Filled Date: 3 / 31 / 2019 Last Medication intake:  Today   Patient states he is not taking his medications like ordered. "I have been hurting a lot"   Pharmacokinetics: Liberation and absorption (onset of action): WNL Distribution (time to peak effect): WNL Metabolism and excretion (duration of action): WNL         Pharmacodynamics: Desired effects: Analgesia: Logan Vazquez reports >50% benefit. Functional ability: Patient reports that medication allows him to accomplish basic ADLs Clinically meaningful improvement in function (CMIF): Sustained CMIF goals met Perceived effectiveness: Described as ineffective and would like to make some changes Undesirable effects: Side-effects or Adverse reactions: None reported Monitoring: [place] PMP: Online review of the past 12-monthperiod conducted. Compliant with practice rules and regulations Last UDS on record: Summary  Date Value Ref Range Status  03/10/2018 FINAL  Final    Comment:    ==================================================================== [facility] COMP DRUG ANALYSIS,UR ==================================================================== Test                             Result       Flag       Units Drug Present and  Declared for Prescription Verification   Pregabalin                     PRESENT      EXPECTED   Duloxetine                     PRESENT      EXPECTED Drug Present not Declared for Prescription Verification   Mirtazapine                    PRESENT      UNEXPECTED Drug Absent but Declared for Prescription Verification   Salicylate                     Not Detected UNEXPECTED    Aspirin, as indicated in the declared medication list, is not    always detected even when used as directed. ==================================================================== Test                      Result    Flag   Units      Ref Range   Creatinine              159              mg/dL      >=20 ==================================================================== Declared Medications:  The flagging and interpretation on this report are based on the  following declared medications.  Unexpected results may arise from  inaccuracies in the declared medications.  **Note: The testing scope of this panel includes these medications:  Duloxetine  Pregabalin  **Note: The testing scope of this panel does not include small to  moderate amounts of these reported medications:  Aspirin (Aspirin 81)  **  Note: The testing scope of this panel does not include following  reported medications:  Albuterol  Atorvastatin  Cholecalciferol  Furosemide  Lisinopril  Nicotine  Nitroglycerin (NTG)  Omeprazole  Vitamin B12 ==================================================================== For clinical consultation, please call [phone]. ====================================================================    UDS interpretation: Compliant          Medication Assessment Form: Reviewed. Patient indicates being compliant with therapy Treatment compliance: Non-compliant. Steps taken to remind the patient of the seriousness of adequate therapy compliance Risk Assessment Profile: Aberrant behavior: taking more medication than  prescribed and See prior evaluations. None observed or detected today Comorbid factors increasing risk of overdose: caucasian and male gender Opioid risk tool (ORT) (Total Score): 0 Personal History of Substance Abuse (SUD-Substance use disorder):  Alcohol: Negative  Illegal Drugs: Negative  Rx Drugs: Negative  ORT Risk Level calculation: Low Risk Risk of substance use disorder (SUD): Moderate Opioid Risk Tool - 01/05/19 1349      Family History of Substance Abuse   Alcohol  Negative    Illegal Drugs  Negative    Rx Drugs  Negative      Personal History of Substance Abuse   Alcohol  Negative    Illegal Drugs  Negative      Age   Age between 54-45 years   No      History of Preadolescent Sexual Abuse   History of Preadolescent Sexual Abuse  Negative or Male      Psychological Disease   Psychological Disease  Negative    Depression  Negative      Total Score   Opioid Risk Tool Scoring  0    Opioid Risk Interpretation  Low Risk      ORT Scoring interpretation table:  Score <3 = Low Risk for SUD  Score between 4-7 = Moderate Risk for SUD  Score >8 = High Risk for Opioid Abuse   Risk Mitigation Strategies:  Patient Counseling: Covered Patient-Prescriber Agreement (PPA): Present and active  Notification to other healthcare providers: Done  Pharmacologic Plan: No change in therapy, at this time.             Laboratory Chemistry  Inflammation Markers (CRP: Acute Phase) (ESR: Chronic Phase) No results found for: CRP, ESRSEDRATE, LATICACIDVEN                       Rheumatology Markers No results found for: RF, ANA, LABURIC, URICUR, LYMEIGGIGMAB, LYMEABIGMQN, HLAB27                      Note: No results found under the [facility] record Will update at next med mgmt   Recent Diagnostic Imaging Results  MR [place]:  Previous fusion in 1985. Now with pain in the hips and legs. Numbness in the legs.  EXAM: MRI  LUMBAR SPINE WITHOUT CONTRAST  TECHNIQUE: Multiplanar, multisequence MR imaging of the lumbar spine was performed. No intravenous contrast was administered.  COMPARISON:  None.  FINDINGS: Segmentation: I do not have plain films for correlation. L5-S1 interspace appears hypoplastic. There may be transitional anatomy.  Alignment: Straightening of the normal lumbar lordosis due to prior fusion L3-S1.  Vertebrae: Chronic T12 compression fracture. The patient has undergone some type of posterior fixation from L3 through S1. There is extensive metallic artifact. I do not have plain film correlates. No pedicle screws are placed. There appears to be solid interbody arthrodesis.  Conus medullaris and  cauda equina: Conus extends to the L1 level. Conus and cauda equina appear normal.  Paraspinal and other soft tissues: Renal cystic disease, incompletely evaluated. Abdominal aortic aneurysm, 43 by 43 mm cross-section, with some mural thrombus.  Disc levels:  L1-L2: Disc desiccation and loss of disc height. Central protrusion. Posterior element hypertrophy. Moderate to severe stenosis. BILATERAL subarticular zone narrowing results in L2 nerve root impingement. BILATERAL foraminal narrowing, not clearly compressive.  L2-L3: Disc desiccation, slight loss of disc height. Central disc extrusion. Posterior element hypertrophy. Severe stenosis, probable near complete block with crowding of the cauda equina nerve roots centrally. Severe BILATERAL L3 nerve root impingement. BILATERAL foraminal narrowing likely affects both L2 nerve roots.  L3-L4:  Post fusion interspace, grossly unremarkable.  L4-L5:  Post fusion interspace, grossly unremarkable.  L5-S1:  Post fusion interspace, grossly unremarkable.  IMPRESSION: Solid appearing L3-S1 arthrodesis. Some type of metallic posterior fixation has been performed.  Severe stenosis at L2-3 related to posterior element hypertrophy and central  disc extrusion. BILATERAL L3 and L2 nerve root impingement are likely.  Moderate to severe stenosis L1-2, central protrusion with posterior element hypertrophy, with BILATERAL L2 nerve root impingement.  4.3 cm abdominal aortic aneurysm. Some mural thrombus is present. Recommend followup by [place] in 1 year. This recommendation follows ACR consensus guidelines: White Paper of the ACR Incidental Findings Committee II on Vascular Findings. J Am Coll Radiol 2013; 10:789-794.  Electronically Signed   By: Staci Righter M.D.   On: 03/05/2018 20:16  Complexity Note: Imaging results reviewed. Results shared with Mr. Schoenberger, using Layman's terms.                         Meds   Current Outpatient Medications:  .  albuterol (PROVENTIL HFA;VENTOLIN HFA) 108 (90 Base) MCG/ACT inhaler, Inhale into the lungs., Disp: , Rfl:  .  aspirin EC 81 MG tablet, Take 81 mg by mouth., Disp: , Rfl:  .  atorvastatin (LIPITOR) 40 MG tablet, Take 1 tablet (40 mg total) by mouth daily., Disp: 30 tablet, Rfl: 3 .  baclofen (LIORESAL) 10 MG tablet, TAKE 1 TABLET TWICE DAILY AS NEEDED FOR HEADACHE OR MUSCLE SPASM, Disp: , Rfl:  .  celecoxib (CELEBREX) 100 MG capsule, Take 1 capsule (100 mg total) by mouth 2 (two) times daily., Disp: 180 capsule, Rfl: 3 .  Cholecalciferol (VITAMIN D-1000 MAX ST) 1000 units tablet, Take by mouth., Disp: , Rfl:  .  Cyanocobalamin (B-12) 2000 MCG TABS, Take by mouth., Disp: , Rfl:  .  docusate sodium (COLACE) 100 MG capsule, Take 100 mg by mouth 2 (two) times daily., Disp: , Rfl:  .  FLUoxetine (PROZAC) 20 MG tablet, Take 1 tablet (20 mg total) by mouth daily., Disp: 30 tablet, Rfl: 0 .  hydrocortisone (ANUSOL-HC) 2.5 % rectal cream, Place 1 application rectally 2 (two) times daily., Disp: 30 g, Rfl: 0 .  lisinopril (PRINIVIL,ZESTRIL) 40 MG tablet, Take 1 tablet (40 mg total) by mouth daily., Disp: 30 tablet, Rfl: 0 .  mupirocin cream (BACTROBAN) 2 %, Apply 1 application topically 2 (two)  times daily., Disp: 15 g, Rfl: 0 .  nitroGLYCERIN (NITROSTAT) 0.4 MG SL tablet, Place under the tongue., Disp: , Rfl:  .  [START ON 02/13/2019] oxyCODONE-acetaminophen (PERCOCET) 10-325 MG tablet, Take 1 tablet by mouth every 8 (eight) hours as needed for up to 30 days for pain., Disp: 90 tablet, Rfl: 0 .  [START ON 01/08/2019] pregabalin (LYRICA) 75 MG capsule,  Take 2 capsules (150 mg total) by mouth at bedtime as needed., Disp: 60 capsule, Rfl: 1 .  [START ON 01/14/2019] oxyCODONE-acetaminophen (PERCOCET) 10-325 MG tablet, Take 1 tablet by mouth every 8 (eight) hours as needed for up to 30 days for pain., Disp: 90 tablet, Rfl: 0  ROS  Constitutional: Denies any fever or chills Gastrointestinal: No reported hemesis, hematochezia, vomiting, or acute GI distress Musculoskeletal: Denies any acute onset joint swelling, redness, loss of ROM, or weakness Neurological: No reported episodes of acute onset apraxia, aphasia, dysarthria, agnosia, amnesia, paralysis, loss of coordination, or loss of consciousness  Allergies  Mr. Dosher is allergic to ibuprofen.  [place]  Drug: Mr. Fauble  reports no history of drug use. Alcohol:  reports no history of alcohol use. Tobacco:  reports that he has been smoking cigarettes. He has a 14.00 pack-year smoking history. He has never used smokeless tobacco. Medical:  has a past medical history of Arthritis, Asthma, CAD (coronary artery disease), Depression, Emphysema lung ([place]), Glaucoma, and Hypertension. Surgical: Mr. Hippler  has a past surgical history that includes heart surgery (07/1999); Hip surgery (Left, 1968); Back surgery (1985); Skin graft (1968); and Humerus fracture surgery (1968). Family: family history includes Cancer in his father; Hearing loss in his mother; Heart attack in his brother; Heart disease in his brother; Hypertension in his father and mother; Stroke in his sister.  Constitutional Exam  General appearance: Well nourished, well developed, and  well hydrated. In no apparent acute distress Vitals:   01/05/19 1341  BP: 135/84  Pulse: 75  Resp: 16  Temp: 98.2 F (36.8 C)  Weight: 150 lb (68 kg)  Height: _0  (1.753 m)  Psych/Mental status: Alert, oriented x 3 (person, place, & time)       Eyes: PERLA Respiratory: No evidence of acute respiratory distress   Lumbar Spine Area Exam  Skin & Axial Inspection: No masses, redness, or swelling Alignment: Symmetrical Functional ROM: Unrestricted ROM       Stability: No instability detected Muscle Tone/Strength: Functionally intact. No obvious neuro-muscular anomalies detected. Sensory (Neurological): Unimpaired Palpation: No palpable anomalies        Gait & Posture Assessment  Ambulation: Unassisted Gait: Relatively normal for age and body habitus Posture: WNL   Lower Extremity Exam    Side: Right lower extremity  Side: Left lower extremity  Stability: No instability observed          Stability: No instability observed          Skin & Extremity Inspection: Skin color, temperature, and hair growth are WNL. No peripheral edema or cyanosis. No masses, redness, swelling, asymmetry, or associated skin lesions. No contractures.  Skin & Extremity Inspection: Evidence of prior arthroplastic surgery  Functional ROM: Unrestricted ROM                  Functional ROM: Decreased ROM                  Muscle Tone/Strength: Functionally intact. No obvious neuro-muscular anomalies detected.  Muscle Tone/Strength: Movement possible against some resistance (4/5)  Sensory (Neurological): Unimpaired        Sensory (Neurological): Unimpaired        Palpation: No palpable anomalies  Palpation: Tender   Assessment  Primary Diagnosis & Pertinent Problem List: The primary encounter diagnosis was Lumbar degenerative disc disease. Diagnoses of Chronic hip pain, left, Chronic bilateral low back pain with bilateral sciatica, Chronic pain syndrome, and Long term current use of opiate  analgesic were also  pertinent to this visit.  Status Diagnosis  Controlled Controlled Controlled 1. Lumbar degenerative disc disease   2. Chronic hip pain, left   3. Chronic bilateral low back pain with bilateral sciatica   4. Chronic pain syndrome   5. Long term current use of opiate analgesic     Problems updated and reviewed during this visit: No problems updated. Plan of Care  Pharmacotherapy (Medications Ordered): Meds ordered this encounter  Medications  . oxyCODONE-acetaminophen (PERCOCET) 10-325 MG tablet    Sig: Take 1 tablet by mouth every 8 (eight) hours as needed for up to 30 days for pain.    Dispense:  90 tablet    Refill:  0    Do not place this medication, or any other prescription from our practice, on "Automatic Refill". Patient may have prescription filled one day early if pharmacy is closed on scheduled refill date.    Order Specific Question:   Supervising Provider    Answer:   Milinda Pointer [phone]  . pregabalin (LYRICA) 75 MG capsule    Sig: Take 2 capsules (150 mg total) by mouth at bedtime as needed.    Dispense:  60 capsule    Refill:  1    Order Specific Question:   Supervising Provider    Answer:   Milinda Pointer [phone]  . oxyCODONE-acetaminophen (PERCOCET) 10-325 MG tablet    Sig: Take 1 tablet by mouth every 8 (eight) hours as needed for up to 30 days for pain.    Dispense:  90 tablet    Refill:  0    Do not place this medication, or any other prescription from our practice, on "Automatic Refill". Patient may have prescription filled one day early if pharmacy is closed on scheduled refill date.    Order Specific Question:   Supervising Provider    Answer:   Milinda Pointer [[id number]]   New Prescriptions   No medications on file   Medications administered today: Jaxton L. Loewen had no medications administered during this visit. Lab-work, procedure(s), and/or referral(s): Orders Placed This Encounter  Procedures  . DG HIP UNILAT W OR W/O PELVIS 2-3  VIEWS LEFT  . ToxASSURE Select 13 (MW), Urine   Imaging and/or referral(s): DG HIP UNILAT W OR W/O PELVIS 2-3 VIEWS LEFT  Interventional therapies: Planned, scheduled, and/or pending:   Not at this time.  Encourage patient to have sister or close friend to call him twice daily secondary to his history of falls.  Unable to afford medical alert per patient.  Declined physical therapy for strengthening and safety   Provider-requested follow-up: Return in about 2 months (around 03/06/2019) for MedMgmt.  Future Appointments  Date Time Provider [place]  03/04/2019  1:30 PM Vevelyn Francois, NP [facility] None  04/09/2019 10:30 AM [facility] 2 [facility] None  04/09/2019 11:30 AM Dew, Erskine Squibb, MD [facility] None   Primary Care Physician: Patient, No Pcp Per Location: [facility] Note by: Vevelyn Francois NP Date: 01/05/2019; Time: 9:30 AM  Pain Score Disclaimer: We use the NRS-11 scale. This is a self-reported, subjective measurement of pain severity with only modest accuracy. It is used primarily to identify changes within a particular patient. It must be understood that outpatient pain scales are significantly less accurate that those used for research, where they can be applied under ideal controlled circumstances with minimal exposure to variables. In reality, the score is likely to be a combination of pain intensity  and pain affect, where pain affect describes the degree of emotional arousal or changes in action readiness caused by the sensory experience of pain. Factors such as social and work situation, setting, emotional state, anxiety levels, expectation, and prior pain experience may influence pain perception and show large inter-individual differences that may also be affected by time variables.  Patient instructions provided during this appointment: Patient Instructions   ____________________________________________________________________________________________  Medication Rules  Purpose: To inform patients, and their family members, of our rules and regulations.  Applies to: All patients receiving prescriptions (written or electronic).  Pharmacy of record: Pharmacy where electronic prescriptions will be sent. If written prescriptions are taken to a different pharmacy, please inform the nursing staff. The pharmacy listed in the electronic medical record should be the one where you would like electronic prescriptions to be sent.  Electronic prescriptions: In compliance with the [place] (STOP) Act of 2017 (Session Lanny Cramp [phone]), effective December 16, 2018, all controlled substances must be electronically prescribed. Calling prescriptions to the pharmacy will cease to exist.  Prescription refills: Only during scheduled appointments. Applies to all prescriptions.  NOTE: The following applies primarily to controlled substances (Opioid* Pain Medications).   Patient's responsibilities: 1. Pain Pills: Bring all pain pills to every appointment (except for procedure appointments). 2. Pill Bottles: Bring pills in original pharmacy bottle. Always bring the newest bottle. Bring bottle, even if empty. 3. Medication refills: You are responsible for knowing and keeping track of what medications you take and those you need refilled. The day before your appointment: write a list of all prescriptions that need to be refilled. The day of the appointment: give the list to the admitting nurse. Prescriptions will be written only during appointments. If you forget a medication: it will not be "Called in", "Faxed", or "electronically sent". You will need to get another appointment to get these prescribed. No early refills. Do not call asking to have your prescription filled early. 4. Prescription Accuracy: You are responsible for  carefully inspecting your prescriptions before leaving our office. Have the discharge nurse carefully go over each prescription with you, before taking them home. Make sure that your name is accurately spelled, that your address is correct. Check the name and dose of your medication to make sure it is accurate. Check the number of pills, and the written instructions to make sure they are clear and accurate. Make sure that you are given enough medication to last until your next medication refill appointment. 5. Taking Medication: Take medication as prescribed. When it comes to controlled substances, taking less pills or less frequently than prescribed is permitted and encouraged. Never take more pills than instructed. Never take medication more frequently than prescribed.  6. Inform other Doctors: Always inform, all of your healthcare providers, of all the medications you take. 7. Pain Medication from other Providers: You are not allowed to accept any additional pain medication from any other Doctor or Healthcare provider. There are two exceptions to this rule. (see below) In the event that you require additional pain medication, you are responsible for notifying us, as stated below. 8. Medication Agreement: You are responsible for carefully reading and following our Medication Agreement. This must be signed before receiving any prescriptions from our practice. Safely store a copy of your signed Agreement. Violations to the Agreement will result in no further prescriptions. (Additional copies of our Medication Agreement are available upon request.) 9. Laws, Rules, & Regulations: All patients are expected to follow all  Federal and [employer], [employer], [employer], [employer]. Ignorance of the Laws does not constitute a valid excuse. The use of any illegal substances is prohibited. 10. Adopted CDC guidelines & recommendations: Target dosing levels will be at or below 60 MME/day. Use of benzodiazepines** is not  recommended.  Exceptions: There are only two exceptions to the rule of not receiving pain medications from other Healthcare Providers. 1. Exception #1 (Emergencies): In the event of an emergency (i.e.: accident requiring emergency care), you are allowed to receive additional pain medication. However, you are responsible for: As soon as you are able, call our office [phone], at any time of the day or night, and leave a message stating your name, the date and nature of the emergency, and the name and dose of the medication prescribed. In the event that your call is answered by a member of our staff, make sure to document and save the date, time, and the name of the person that took your information.  2. Exception #2 (Planned Surgery): In the event that you are scheduled by another doctor or dentist to have any type of surgery or procedure, you are allowed (for a period no longer than 30 days), to receive additional pain medication, for the acute post-op pain. However, in this case, you are responsible for picking up a copy of our "Post-op Pain Management for Surgeons" handout, and giving it to your surgeon or dentist. This document is available at our office, and does not require an appointment to obtain it. Simply go to our office during business hours (Monday-Thursday from 8:00 AM to 4:00 PM) (Friday 8:00 AM to 12:00 Noon) or if you have a scheduled appointment with [place], prior to your surgery, and ask for it by name. In addition, you will need to provide [place] with your name, name of your surgeon, type of surgery, and date of procedure or surgery.  *Opioid medications include: morphine, codeine, oxycodone, oxymorphone, hydrocodone, hydromorphone, meperidine, tramadol, tapentadol, buprenorphine, fentanyl, methadone. **Benzodiazepine medications include: diazepam (Valium), alprazolam (Xanax), clonazepam (Klonopine), lorazepam (Ativan), clorazepate (Tranxene), chlordiazepoxide (Librium), estazolam (Prosom),  oxazepam (Serax), temazepam (Restoril), triazolam (Halcion) (Last updated: 02/12/2018) ____________________________________________________________________________________________

## 2019-01-05 NOTE — Patient Instructions (Signed)
____________________________________________________________________________________________  Medication Rules  Purpose: To inform patients, and their family members, of our rules and regulations.  Applies to: All patients receiving prescriptions (written or electronic).  Pharmacy of record: Pharmacy where electronic prescriptions will be sent. If written prescriptions are taken to a different pharmacy, please inform the nursing staff. The pharmacy listed in the electronic medical record should be the one where you would like electronic prescriptions to be sent.  Electronic prescriptions: In compliance with the [place] Strengthen Opioid Misuse Prevention (STOP) Act of 2017 (Session Law 2017-74/H243), effective December 16, 2018, all controlled substances must be electronically prescribed. Calling prescriptions to the pharmacy will cease to exist.  Prescription refills: Only during scheduled appointments. Applies to all prescriptions.  NOTE: The following applies primarily to controlled substances (Opioid* Pain Medications).   Patient's responsibilities: 1. Pain Pills: Bring all pain pills to every appointment (except for procedure appointments). 2. Pill Bottles: Bring pills in original pharmacy bottle. Always bring the newest bottle. Bring bottle, even if empty. 3. Medication refills: You are responsible for knowing and keeping track of what medications you take and those you need refilled. The day before your appointment: write a list of all prescriptions that need to be refilled. The day of the appointment: give the list to the admitting nurse. Prescriptions will be written only during appointments. If you forget a medication: it will not be "Called in", "Faxed", or "electronically sent". You will need to get another appointment to get these prescribed. No early refills. Do not call asking to have your prescription filled early. 4. Prescription Accuracy: You are responsible for  carefully inspecting your prescriptions before leaving our office. Have the discharge nurse carefully go over each prescription with you, before taking them home. Make sure that your name is accurately spelled, that your address is correct. Check the name and dose of your medication to make sure it is accurate. Check the number of pills, and the written instructions to make sure they are clear and accurate. Make sure that you are given enough medication to last until your next medication refill appointment. 5. Taking Medication: Take medication as prescribed. When it comes to controlled substances, taking less pills or less frequently than prescribed is permitted and encouraged. Never take more pills than instructed. Never take medication more frequently than prescribed.  6. Inform other Doctors: Always inform, all of your healthcare providers, of all the medications you take. 7. Pain Medication from other Providers: You are not allowed to accept any additional pain medication from any other Doctor or Healthcare provider. There are two exceptions to this rule. (see below) In the event that you require additional pain medication, you are responsible for notifying us, as stated below. 8. Medication Agreement: You are responsible for carefully reading and following our Medication Agreement. This must be signed before receiving any prescriptions from our practice. Safely store a copy of your signed Agreement. Violations to the Agreement will result in no further prescriptions. (Additional copies of our Medication Agreement are available upon request.) 9. Laws, Rules, & Regulations: All patients are expected to follow all Federal and State Laws, Statutes, Rules, & Regulations. Ignorance of the Laws does not constitute a valid excuse. The use of any illegal substances is prohibited. 10. Adopted CDC guidelines & recommendations: Target dosing levels will be at or below 60 MME/day. Use of benzodiazepines** is not  recommended.  Exceptions: There are only two exceptions to the rule of not receiving pain medications from other Healthcare Providers. 1.   Exception #1 (Emergencies): In the event of an emergency (i.e.: accident requiring emergency care), you are allowed to receive additional pain medication. However, you are responsible for: As soon as you are able, call our office [phone], at any time of the day or night, and leave a message stating your name, the date and nature of the emergency, and the name and dose of the medication prescribed. In the event that your call is answered by a member of our staff, make sure to document and save the date, time, and the name of the person that took your information.  2. Exception #2 (Planned Surgery): In the event that you are scheduled by another doctor or dentist to have any type of surgery or procedure, you are allowed (for a period no longer than 30 days), to receive additional pain medication, for the acute post-op pain. However, in this case, you are responsible for picking up a copy of our "Post-op Pain Management for Surgeons" handout, and giving it to your surgeon or dentist. This document is available at our office, and does not require an appointment to obtain it. Simply go to our office during business hours (Monday-Thursday from 8:00 AM to 4:00 PM) (Friday 8:00 AM to 12:00 Noon) or if you have a scheduled appointment with us, prior to your surgery, and ask for it by name. In addition, you will need to provide us with your name, name of your surgeon, type of surgery, and date of procedure or surgery.  *Opioid medications include: morphine, codeine, oxycodone, oxymorphone, hydrocodone, hydromorphone, meperidine, tramadol, tapentadol, buprenorphine, fentanyl, methadone. **Benzodiazepine medications include: diazepam (Valium), alprazolam (Xanax), clonazepam (Klonopine), lorazepam (Ativan), clorazepate (Tranxene), chlordiazepoxide (Librium), estazolam (Prosom),  oxazepam (Serax), temazepam (Restoril), triazolam (Halcion) (Last updated: 02/12/2018) ____________________________________________________________________________________________    

## 2019-01-11 LAB — TOXASSURE SELECT 13 (MW), URINE

## 2019-02-01 ENCOUNTER — Telehealth: Payer: Self-pay | Admitting: *Deleted

## 2019-02-01 NOTE — Telephone Encounter (Signed)
Received referral for low dose lung cancer screening CT scan. Message left at phone number listed in EMR for patient to call me back to facilitate scheduling scan.  

## 2019-02-02 ENCOUNTER — Telehealth: Payer: Self-pay | Admitting: *Deleted

## 2019-02-02 ENCOUNTER — Telehealth: Payer: Self-pay | Admitting: Student in an Organized Health Care Education/Training Program

## 2019-02-02 ENCOUNTER — Encounter: Payer: Self-pay | Admitting: *Deleted

## 2019-02-02 DIAGNOSIS — Z87891 Personal history of nicotine dependence: Secondary | ICD-10-CM

## 2019-02-02 DIAGNOSIS — Z122 Encounter for screening for malignant neoplasm of respiratory organs: Secondary | ICD-10-CM

## 2019-02-02 NOTE — Telephone Encounter (Signed)
Called and talked with patient. Informed  Him that he needs to to and have xrays taken of his back and hip and then will need a follow up appointment to discuss these. He states he has appt in March. Reinforced need to have xrays taken before March appt.

## 2019-02-02 NOTE — Telephone Encounter (Signed)
Received referral for initial lung cancer screening scan. Contacted patient and obtained smoking history,(current, 116 pack year) as well as answering questions related to screening process. Patient denies signs of lung cancer such as weight loss or hemoptysis. Patient denies comorbidity that would prevent curative treatment if lung cancer were found. Patient is scheduled for shared decision making visit and CT scan on 02/18/19 at 2pm.

## 2019-02-02 NOTE — Telephone Encounter (Signed)
Please inform him that he needs to have the xray of his back and hip. Then we can further treat. If he feels that he needs an evaluation then have him come but he needs to get the images done. However if he is would like an MRI now he may have to go to the ER. Insurance does not always approve an additional MRI when there is one that is less than 74 year old .

## 2019-02-02 NOTE — Telephone Encounter (Signed)
Patient called stating his back is hurting very bad. Has not had xray of hips. Would like to see if he can get an MRI of his back. Just had MRI March of 2019. Has [employer]. Would like to speak with C. Brooke Dare if possible

## 2019-02-17 ENCOUNTER — Telehealth: Payer: Self-pay | Admitting: *Deleted

## 2019-02-17 NOTE — Telephone Encounter (Signed)
CALLED PT TO REMIND HIM OF HIS APPT FOR LDCT SCREENING ON 02-18-2019@1400 ,  VOICED UNDERSTANDING.

## 2019-02-18 ENCOUNTER — Inpatient Hospital Stay: Payer: Medicare HMO | Attending: Nurse Practitioner | Admitting: Oncology

## 2019-02-18 ENCOUNTER — Other Ambulatory Visit: Payer: Self-pay

## 2019-02-18 ENCOUNTER — Ambulatory Visit
Admission: RE | Admit: 2019-02-18 | Discharge: 2019-02-18 | Disposition: A | Discharge status: Home / Self Care | Payer: Medicare HMO | Source: Ambulatory Visit | Attending: Nurse Practitioner | Admitting: Nurse Practitioner

## 2019-02-18 DIAGNOSIS — G8929 Other chronic pain: Secondary | ICD-10-CM

## 2019-02-18 DIAGNOSIS — M25552 Pain in left hip: Principal | ICD-10-CM

## 2019-02-18 DIAGNOSIS — Z87891 Personal history of nicotine dependence: Secondary | ICD-10-CM

## 2019-02-18 DIAGNOSIS — Z122 Encounter for screening for malignant neoplasm of respiratory organs: Secondary | ICD-10-CM | POA: Diagnosis not present

## 2019-02-18 NOTE — Progress Notes (Signed)
In accordance with CMS guidelines, patient has met eligibility criteria including age, absence of signs or symptoms of lung cancer.  Social History   Tobacco Use  . Smoking status: Current Every Day Smoker    Packs/day: 2.00    Years: 58.00    Pack years: 116.00    Types: Cigarettes  . Smokeless tobacco: Never Used  . Tobacco comment: started smoking at age 73; has decreased cigarette use to 2 cigarettes per day from 1.5 PPD  Substance Use Topics  . Alcohol use: No  . Drug use: No     A shared decision-making session was conducted prior to the performance of CT scan. This includes one or more decision aids, includes benefits and harms of screening, follow-up diagnostic testing, over-diagnosis, false positive rate, and total radiation exposure.  Counseling on the importance of adherence to annual lung cancer LDCT screening, impact of co-morbidities, and ability or willingness to undergo diagnosis and treatment is imperative for compliance of the program.  Counseling on the importance of continued smoking cessation for former smokers; the importance of smoking cessation for current smokers, and information about tobacco cessation interventions have been given to patient including [name] and [facility] quit [name] programs.  Written order for lung cancer screening with LDCT has been given to the patient and any and all questions have been answered to the best of my abilities.   Yearly follow up will be coordinated by Burgess Estelle, Thoracic Navigator.  Faythe Casa, NP 02/18/2019 2:45 PM

## 2019-02-19 ENCOUNTER — Encounter: Payer: Self-pay | Admitting: *Deleted

## 2019-03-03 ENCOUNTER — Encounter: Payer: Self-pay | Admitting: *Deleted

## 2019-03-04 ENCOUNTER — Ambulatory Visit: Payer: Medicare HMO | Attending: Nurse Practitioner | Admitting: Nurse Practitioner

## 2019-03-04 ENCOUNTER — Other Ambulatory Visit: Payer: Self-pay

## 2019-03-04 ENCOUNTER — Encounter: Payer: Self-pay | Admitting: Nurse Practitioner

## 2019-03-04 VITALS — BP 148/79 | HR 70 | Temp 97.6°F | Ht 69.0 in | Wt 145.0 lb

## 2019-03-04 DIAGNOSIS — G894 Chronic pain syndrome: Secondary | ICD-10-CM | POA: Diagnosis present

## 2019-03-04 DIAGNOSIS — Z79891 Long term (current) use of opiate analgesic: Secondary | ICD-10-CM | POA: Diagnosis not present

## 2019-03-04 DIAGNOSIS — M5441 Lumbago with sciatica, right side: Secondary | ICD-10-CM | POA: Insufficient documentation

## 2019-03-04 DIAGNOSIS — M5442 Lumbago with sciatica, left side: Secondary | ICD-10-CM | POA: Insufficient documentation

## 2019-03-04 DIAGNOSIS — G8929 Other chronic pain: Secondary | ICD-10-CM

## 2019-03-04 DIAGNOSIS — M48061 Spinal stenosis, lumbar region without neurogenic claudication: Secondary | ICD-10-CM

## 2019-03-04 DIAGNOSIS — M25552 Pain in left hip: Secondary | ICD-10-CM | POA: Insufficient documentation

## 2019-03-04 MED ORDER — OXYCODONE-ACETAMINOPHEN 10-325 MG PO TABS: 1.0000 | ORAL_TABLET | Freq: Three times a day (TID) | ORAL | 0 refills | Status: DC | PRN

## 2019-03-04 MED ORDER — PREGABALIN 75 MG PO CAPS: 150.0000 mg | ORAL_CAPSULE | Freq: Every evening | ORAL | 1 refills | Status: DC | PRN

## 2019-03-04 NOTE — Patient Instructions (Signed)
____________________________________________________________________________________________  Medication Rules  Purpose: To inform patients, and their family members, of our rules and regulations.  Applies to: All patients receiving prescriptions (written or electronic).  Pharmacy of record: Pharmacy where electronic prescriptions will be sent. If written prescriptions are taken to a different pharmacy, please inform the nursing staff. The pharmacy listed in the electronic medical record should be the one where you would like electronic prescriptions to be sent.  Electronic prescriptions: In compliance with the [place] Strengthen Opioid Misuse Prevention (STOP) Act of 2017 (Session Law 2017-74/H243), effective December 16, 2018, all controlled substances must be electronically prescribed. Calling prescriptions to the pharmacy will cease to exist.  Prescription refills: Only during scheduled appointments. Applies to all prescriptions.  NOTE: The following applies primarily to controlled substances (Opioid* Pain Medications).   Patient's responsibilities: 1. Pain Pills: Bring all pain pills to every appointment (except for procedure appointments). 2. Pill Bottles: Bring pills in original pharmacy bottle. Always bring the newest bottle. Bring bottle, even if empty. 3. Medication refills: You are responsible for knowing and keeping track of what medications you take and those you need refilled. The day before your appointment: write a list of all prescriptions that need to be refilled. The day of the appointment: give the list to the admitting nurse. Prescriptions will be written only during appointments. No prescriptions will be written on procedure days. If you forget a medication: it will not be "Called in", "Faxed", or "electronically sent". You will need to get another appointment to get these prescribed. No early refills. Do not call asking to have your prescription filled  early. 4. Prescription Accuracy: You are responsible for carefully inspecting your prescriptions before leaving our office. Have the discharge nurse carefully go over each prescription with you, before taking them home. Make sure that your name is accurately spelled, that your address is correct. Check the name and dose of your medication to make sure it is accurate. Check the number of pills, and the written instructions to make sure they are clear and accurate. Make sure that you are given enough medication to last until your next medication refill appointment. 5. Taking Medication: Take medication as prescribed. When it comes to controlled substances, taking less pills or less frequently than prescribed is permitted and encouraged. Never take more pills than instructed. Never take medication more frequently than prescribed.  6. Inform other Doctors: Always inform, all of your healthcare providers, of all the medications you take. 7. Pain Medication from other Providers: You are not allowed to accept any additional pain medication from any other Doctor or Healthcare provider. There are two exceptions to this rule. (see below) In the event that you require additional pain medication, you are responsible for notifying us, as stated below. 8. Medication Agreement: You are responsible for carefully reading and following our Medication Agreement. This must be signed before receiving any prescriptions from our practice. Safely store a copy of your signed Agreement. Violations to the Agreement will result in no further prescriptions. (Additional copies of our Medication Agreement are available upon request.) 9. Laws, Rules, & Regulations: All patients are expected to follow all Federal and State Laws, Statutes, Rules, & Regulations. Ignorance of the Laws does not constitute a valid excuse. The use of any illegal substances is prohibited. 10. Adopted CDC guidelines & recommendations: Target dosing levels will be  at or below 60 MME/day. Use of benzodiazepines** is not recommended.  Exceptions: There are only two exceptions to the rule of not   receiving pain medications from other Healthcare Providers. 1. Exception #1 (Emergencies): In the event of an emergency (i.e.: accident requiring emergency care), you are allowed to receive additional pain medication. However, you are responsible for: As soon as you are able, call our office [phone], at any time of the day or night, and leave a message stating your name, the date and nature of the emergency, and the name and dose of the medication prescribed. In the event that your call is answered by a member of our staff, make sure to document and save the date, time, and the name of the person that took your information.  2. Exception #2 (Planned Surgery): In the event that you are scheduled by another doctor or dentist to have any type of surgery or procedure, you are allowed (for a period no longer than 30 days), to receive additional pain medication, for the acute post-op pain. However, in this case, you are responsible for picking up a copy of our "Post-op Pain Management for Surgeons" handout, and giving it to your surgeon or dentist. This document is available at our office, and does not require an appointment to obtain it. Simply go to our office during business hours (Monday-Thursday from 8:00 AM to 4:00 PM) (Friday 8:00 AM to 12:00 Noon) or if you have a scheduled appointment with us, prior to your surgery, and ask for it by name. In addition, you will need to provide us with your name, name of your surgeon, type of surgery, and date of procedure or surgery.  *Opioid medications include: morphine, codeine, oxycodone, oxymorphone, hydrocodone, hydromorphone, meperidine, tramadol, tapentadol, buprenorphine, fentanyl, methadone. **Benzodiazepine medications include: diazepam (Valium), alprazolam (Xanax), clonazepam (Klonopine), lorazepam (Ativan), clorazepate  (Tranxene), chlordiazepoxide (Librium), estazolam (Prosom), oxazepam (Serax), temazepam (Restoril), triazolam (Halcion) (Last updated: 02/12/2018) ____________________________________________________________________________________________    

## 2019-03-04 NOTE — Progress Notes (Signed)
Nursing Pain Medication Assessment:  Safety precautions to be maintained throughout the outpatient stay will include: orient to surroundings, keep bed in low position, maintain call bell within reach at all times, provide assistance with transfer out of bed and ambulation.  Medication Inspection Compliance: Pill count conducted under aseptic conditions, in front of the patient. Neither the pills nor the bottle was removed from the patient's sight at any time. Once count was completed pills were immediately returned to the patient in their original bottle.  Medication: Oxycodone/APAP Pill/Patch Count: 6 of 90 pills remain Pill/Patch Appearance: Markings consistent with prescribed medication Bottle Appearance: Standard pharmacy container. Clearly labeled. Filled Date: 2 / 29 / 2020 Last Medication intake:  Today

## 2019-03-04 NOTE — Progress Notes (Signed)
Patient's Name: Logan Vazquez  MRN: [id number]  Referring Provider: No ref. provider found  DOB: 1946-01-09  PCP: Letta Median, MD  DOS: 03/04/2019  Note by: Dionisio David, NP  Service setting: Ambulatory outpatient  Specialty: Interventional Pain Management  Location: [facility] (AMB) Pain Management Facility    Patient type: Established   HPI  Reason for Visit: Logan Vazquez is a 73 y.o. year old, male patient, who comes today with a chief complaint of Back Pain Last Appointment: His last appointment at our practice was on 02/02/2019. I last saw him on 01/05/2019.  Pain Assessment: Today, Mr. Dettman describes the severity of the Chronic pain as a 8 /10. He indicates the location/referral of the pain to be Back Lower, Right, Left(hips)/pain radiaties down leg to foot. Onset was: More than a month ago. The quality of pain is described as Sharp, Constant. Temporal description, or timing of pain is: Constant. Possible modifying factors: meds, lay down. Mr. Azevedo  height is 5' 9" (1.753 m) and weight is 145 lb (65.8 kg). His temperature is 97.6 F (36.4 C). His blood pressure is 148/79 (abnormal) and his pulse is 70. His oxygen saturation is 93%. He admits that he has pain that goes down both legs to his ankles.  He does have some numbness and tingling.  He also has weakness.  He uses walker.  Controlled Substance Pharmacotherapy Assessment REMS (Risk Evaluation and Mitigation Strategy)  Analgesic:Percocet 10 mg 3 times daily as needed, quantity 90/month MME/day:54m/day BChauncey Fischer RN  03/04/2019  1:43 PM  Sign when Signing Visit Nursing Pain Medication Assessment:  Safety precautions to be maintained throughout the outpatient stay will include: orient to surroundings, keep bed in low position, maintain call bell within reach at all times, provide assistance with transfer out of bed and ambulation.  Medication Inspection Compliance: Pill count conducted under aseptic conditions, in  front of the patient. Neither the pills nor the bottle was removed from the patient's sight at any time. Once count was completed pills were immediately returned to the patient in their original bottle.  Medication: Oxycodone/APAP Pill/Patch Count: 6 of 90 pills remain Pill/Patch Appearance: Markings consistent with prescribed medication Bottle Appearance: Standard pharmacy container. Clearly labeled. Filled Date: 2 / 29 / 2020 Last Medication intake:  Today   Pharmacokinetics: Liberation and absorption (onset of action): WNL Distribution (time to peak effect): WNL Metabolism and excretion (duration of action): WNL         Pharmacodynamics: Desired effects: Analgesia: Mr. GRabideaureports >50% benefit. Functional ability: Patient reports that medication allows him to accomplish basic ADLs Clinically meaningful improvement in function (CMIF): Sustained CMIF goals met Perceived effectiveness: Described as relatively effective, allowing for increase in activities of daily living (ADL) Undesirable effects: Side-effects or Adverse reactions: None reported Monitoring: [place] PMP: Online review of the past 146-montheriod conducted. Compliant with practice rules and regulations Last UDS on record: Summary  Date Value Ref Range Status  01/05/2019 FINAL  Final    Comment:    ==================================================================== TOXASSURE SELECT 13 (MW) ==================================================================== Test                             Result       Flag       Units Drug Present and Declared for Prescription Verification   Oxycodone  1254         EXPECTED   ng/mg creat   Oxymorphone                    310          EXPECTED   ng/mg creat   Noroxycodone                   4592         EXPECTED   ng/mg creat   Noroxymorphone                 247          EXPECTED   ng/mg creat    Sources of oxycodone are scheduled prescription medications.     Oxymorphone, noroxycodone, and noroxymorphone are expected    metabolites of oxycodone. Oxymorphone is also available as a    scheduled prescription medication. Drug Present not Declared for Prescription Verification   Alprazolam                     15           UNEXPECTED ng/mg creat   Alpha-hydroxyalprazolam        19           UNEXPECTED ng/mg creat    Source of alprazolam is a scheduled prescription medication.    Alpha-hydroxyalprazolam is an expected metabolite of alprazolam. ==================================================================== Test                      Result    Flag   Units      Ref Range   Creatinine              150              mg/dL      >=20 ==================================================================== Declared Medications:  The flagging and interpretation on this report are based on the  following declared medications.  Unexpected results may arise from  inaccuracies in the declared medications.  **Note: The testing scope of this panel includes these medications:  Oxycodone (Oxycodone Acetaminophen)  **Note: The testing scope of this panel does not include following  reported medications:  Acetaminophen (Oxycodone Acetaminophen)  Albuterol  Aspirin (Aspirin 81)  Atorvastatin  Baclofen  Celecoxib  Cholecalciferol  Cyanocobalamin  Docusate  Fluoxetine  Hydrocortisone  Lisinopril  Mupirocin  Nitroglycerin  Pregabalin ==================================================================== For clinical consultation, please call [phone]. ====================================================================    UDS interpretation: Non-Compliant Patient informed of the CDC guidelines and recommendations to stay away from the concomitant use of benzodiazepines and opioids due to the increased risk of respiratory depression and death. Medication Assessment Form: Reviewed. Patient indicates being compliant with therapy Treatment compliance:  Non-compliant not prescribed Benzo per PMp Risk Assessment Profile: Aberrant behavior: See initial evaluations. None observed or detected today Comorbid factors increasing risk of overdose: See initial evaluation. No additional risks detected today Opioid risk tool (ORT):  Opioid Risk  01/05/2019  Alcohol 0  Illegal Drugs 0  Rx Drugs 0  Alcohol 0  Illegal Drugs 0  Rx Drugs -  Age between 16-45 years  0  History of Preadolescent Sexual Abuse 0  Psychological Disease 0  Depression 0  Opioid Risk Tool Scoring 0  Opioid Risk Interpretation Low Risk    ORT Scoring interpretation table:  Score <3 = Low Risk for SUD  Score between 4-7 = Moderate Risk for SUD  Score >8 = High  Risk for Opioid Abuse   Risk of substance use disorder (SUD): Low  Risk Mitigation Strategies:  Patient Counseling: Covered Patient-Prescriber Agreement (PPA): Present and active  Notification to other healthcare providers: Done  Pharmacologic Plan: No change in therapy, at this time.             ROS  Constitutional: Denies any fever or chills Gastrointestinal: No reported hemesis, hematochezia, vomiting, or acute GI distress Musculoskeletal: Denies any acute onset joint swelling, redness, loss of ROM, or weakness Neurological: No reported episodes of acute onset apraxia, aphasia, dysarthria, agnosia, amnesia, paralysis, loss of coordination, or loss of consciousness  Medication Review  B-12, Cholecalciferol, FLUoxetine, albuterol, aspirin EC, atorvastatin, baclofen, docusate sodium, hydrocortisone, lisinopril, mupirocin cream, nitroGLYCERIN, oxyCODONE-acetaminophen, and pregabalin  History Review  Allergy: Mr. Agne is allergic to ibuprofen. Drug: Mr. Denio  reports no history of drug use. Alcohol:  reports no history of alcohol use. Tobacco:  reports that he has been smoking cigarettes. He has a 116.00 pack-year smoking history. He has never used smokeless tobacco. Social: Mr. Delaughter  reports that he  has been smoking cigarettes. He has a 116.00 pack-year smoking history. He has never used smokeless tobacco. He reports that he does not drink alcohol or use drugs. Medical:  has a past medical history of Arthritis, Asthma, CAD (coronary artery disease), Depression, Emphysema lung ([place]), Glaucoma, and Hypertension. Surgical: Mr. Riendeau  has a past surgical history that includes heart surgery (07/1999); Hip surgery (Left, 1968); Back surgery (1985); Skin graft (1968); and Humerus fracture surgery (1968). Family: family history includes Cancer in his father; Hearing loss in his mother; Heart attack in his brother; Heart disease in his brother; Hypertension in his father and mother; Stroke in his sister. Problem List: Mr. Matus has Long term current use of opiate analgesic; Chronic bilateral low back pain with bilateral sciatica; Chronic hip pain, left; and Spinal stenosis of lumbar region on their pertinent problem list.  Lab Review  Kidney Function Lab Results  Component Value Date   BUN 15 10/23/2017   CREATININE 0.92 74/82/7078   BCR NOT APPLICABLE 67/54/4920  Liver Function Lab Results  Component Value Date   AST 14 10/23/2017   ALT 10 10/23/2017  Note: Above Lab results reviewed.  Imaging Review  DG HIP UNILAT W OR W/O PELVIS 2-3 VIEWS LEFT CLINICAL DATA:  Chronic left hip pain. Previous injury and surgery to the left hip in the 1960s.  EXAM: DG HIP (WITH OR WITHOUT PELVIS) 2-3V LEFT  COMPARISON:  None.  FINDINGS: Diffuse osteopenia. Old, healed proximal left femur fracture with a probable involuting intramedullary rod tract. Associated corticated bony fragmentation at the superior aspect of the greater trochanter. Lumbosacral spine hardware and bone fusion.  IMPRESSION: No acute abnormality. Old posttraumatic and postsurgical changes, as described above.  Electronically Signed   By: Claudie Revering M.D.   On: 02/18/2019 20:36 CT CHEST LUNG CANCER SCREENING LOW DOSE WO  CONTRAST CLINICAL DATA:  73 year old male current smoker, with 116 pack-year history of smoking, for initial lung cancer screening  EXAM: CT CHEST WITHOUT CONTRAST LOW-DOSE FOR LUNG CANCER SCREENING  TECHNIQUE: Multidetector CT imaging of the chest was performed following the standard protocol without IV contrast.  COMPARISON:  None.  FINDINGS: Cardiovascular: The heart is normal in size. No pericardial effusion.  No evidence of thoracic aortic aneurysm. Atherosclerotic calcifications of the aortic arch.  Three vessel coronary atherosclerosis.  Mediastinum/Nodes: No suspicious mediastinal lymphadenopathy.  Visualized thyroid is unremarkable.  Lungs/Pleura: Mild  paraseptal emphysematous changes, upper lobe predominant.  Mild subpleural reticulation/fibrosis in the bilateral lower lobes.  No focal consolidation.  No suspicious pulmonary nodules.  No pleural effusion or pneumothorax.  Upper Abdomen: Visualized upper abdomen is notable for vascular calcifications, prior cholecystectomy, and a 3.4 cm posterior interpolar left renal cyst.  Musculoskeletal: Degenerative changes of the visualized thoracolumbar spine.  IMPRESSION: Lung-RADS 1, negative. Continue annual screening with low-dose chest CT without contrast in 12 months.  Aortic Atherosclerosis (ICD10-I70.0) and Emphysema (ICD10-J43.9).  Electronically Signed   By: Julian Hy M.D.   On: 02/18/2019 15:25 Note: Reviewed        Physical Exam  General appearance: Well nourished, well developed, and well hydrated. In no apparent acute distress Mental status: Alert, oriented x 3 (person, place, & time)       Respiratory: No evidence of acute respiratory distress Eyes: PERLA Vitals: BP (!) 148/79   Pulse 70   Temp 97.6 F (36.4 C)   Ht 5' 9" (1.753 m)   Wt 145 lb (65.8 kg)   SpO2 93%   BMI 21.41 kg/m  BMI: Estimated body mass index is 21.41 kg/m as calculated from the following:   Height as of  this encounter: 5' 9" (1.753 m).   Weight as of this encounter: 145 lb (65.8 kg). Ideal: Ideal body weight: 70.7 kg (155 lb 13.8 oz) Lumbar Spine Area Exam  Skin & Axial Inspection: No masses, redness, or swelling Alignment: Symmetrical Functional ROM: Unrestricted ROM       Stability: No instability detected Muscle Tone/Strength: Functionally intact. No obvious neuro-muscular anomalies detected. Sensory (Neurological): Unimpaired Palpation: Complains of area being tender to palpation       Provocative Tests: Hyperextension/rotation test: Positive bilaterally for facet joint pain. Lumbar quadrant test (Kemp's test): deferred today       Lateral bending test: (+) ipsilateral radicular pain, bilaterally. Positive for bilateral foraminal stenosis. Patrick's Maneuver: deferred today                    Gait & Posture Assessment  Ambulation: Patient ambulates using a walker Gait: Antalgic Posture: WNL  Lower Extremity Exam    Side: Right lower extremity  Side: Left lower extremity  Stability: No instability observed          Stability: No instability observed          Skin & Extremity Inspection: Skin color, temperature, and hair growth are WNL. No peripheral edema or cyanosis. No masses, redness, swelling, asymmetry, or associated skin lesions. No contractures.  Skin & Extremity Inspection: Skin color, temperature, and hair growth are WNL. No peripheral edema or cyanosis. No masses, redness, swelling, asymmetry, or associated skin lesions. No contractures.  Functional ROM: Adequate ROM                  Functional ROM: Adequate ROM                  Muscle Tone/Strength: Functionally intact. No obvious neuro-muscular anomalies detected.  Muscle Tone/Strength: Functionally intact. No obvious neuro-muscular anomalies detected.  Sensory (Neurological): Dermatomal pain pattern        Sensory (Neurological): Dermatomal pain pattern        Palpation: No palpable anomalies  Palpation: No palpable  anomalies   Assessment   Status Diagnosis  Persistent Persistent Controlled 1. Spinal stenosis of lumbar region, unspecified whether neurogenic claudication present   2. Chronic bilateral low back pain with bilateral sciatica   3. Chronic  hip pain, left   4. Long term current use of opiate analgesic   5. Chronic pain syndrome      Updated Problems: Problem  Spinal Stenosis of Lumbar Region    Plan of Care  Pharmacotherapy (Medications Ordered): Meds ordered this encounter  Medications  . oxyCODONE-acetaminophen (PERCOCET) 10-325 MG tablet    Sig: Take 1 tablet by mouth every 8 (eight) hours as needed for up to 30 days for pain.    Dispense:  90 tablet    Refill:  0    Do not place this medication, or any other prescription from our practice, on "Automatic Refill". Patient may have prescription filled one day early if pharmacy is closed on scheduled refill date.    Order Specific Question:   Supervising Provider    Answer:   Gillis Santa [[id number]]  . pregabalin (LYRICA) 75 MG capsule    Sig: Take 2 capsules (150 mg total) by mouth at bedtime as needed.    Dispense:  60 capsule    Refill:  1    Order Specific Question:   Supervising Provider    Answer:   Gillis Santa [[id number]]  . oxyCODONE-acetaminophen (PERCOCET) 10-325 MG tablet    Sig: Take 1 tablet by mouth every 8 (eight) hours as needed for up to 30 days for pain. For chronic pain To last for 30 days from fill date    Dispense:  90 tablet    Refill:  0    Do not place this medication, or any other prescription from our practice, on "Automatic Refill". Patient may have prescription filled one day early if pharmacy is closed on scheduled refill date.    Order Specific Question:   Supervising Provider    Answer:   Gillis Santa [[id number]]  . oxyCODONE-acetaminophen (PERCOCET) 10-325 MG tablet    Sig: Take 1 tablet by mouth every 8 (eight) hours as needed for up to 30 days for pain.    Dispense:  90 tablet    Refill:  0     Do not place this medication, or any other prescription from our practice, on "Automatic Refill". Patient may have prescription filled one day early if pharmacy is closed on scheduled refill date.    Order Specific Question:   Supervising Provider    Answer:   Gillis Santa [[id number]]   Administered today: Reynolds Bowl. [place] had no medications administered during this visit.  Orders:  Orders Placed This Encounter  Procedures  . Lumbar Transforaminal Epidural    Standing Status:   Future    Standing Expiration Date:   04/04/2019    Scheduling Instructions:     Side: Bilateral     Level: L2-3     Sedation: No Sedation.     Timeframe: ASAA    Order Specific Question:   Where will this procedure be performed?    Answer:   [facility]   Follow-up plan:   Return in about 3 months (around 06/04/2019) for MedMgmt,Procedure(NS), w/ Dr. Holley Raring (B) TFESI, .    Note by: Dionisio David, NP Date: 03/04/2019; Time: 2:26 PM

## 2019-03-22 ENCOUNTER — Other Ambulatory Visit: Payer: Self-pay | Admitting: Family Medicine

## 2019-03-30 ENCOUNTER — Other Ambulatory Visit: Payer: Self-pay | Admitting: Family Medicine

## 2019-04-09 ENCOUNTER — Ambulatory Visit (INDEPENDENT_AMBULATORY_CARE_PROVIDER_SITE_OTHER): Payer: Medicare HMO | Admitting: Vascular Surgery

## 2019-04-09 ENCOUNTER — Other Ambulatory Visit (INDEPENDENT_AMBULATORY_CARE_PROVIDER_SITE_OTHER): Payer: Medicare HMO

## 2019-04-21 ENCOUNTER — Other Ambulatory Visit: Payer: Self-pay | Admitting: Family Medicine

## 2019-05-12 ENCOUNTER — Ambulatory Visit: Payer: Medicare HMO | Admitting: Student in an Organized Health Care Education/Training Program

## 2019-05-17 ENCOUNTER — Telehealth: Payer: Self-pay

## 2019-05-17 MED ORDER — PREGABALIN 75 MG PO CAPS: 150.0000 mg | ORAL_CAPSULE | Freq: Every evening | ORAL | 3 refills | Status: DC | PRN

## 2019-05-17 NOTE — Telephone Encounter (Signed)
Patient notified

## 2019-05-17 NOTE — Telephone Encounter (Signed)
Need refill for Lyrica. Has appt on 05-26-19.

## 2019-05-17 NOTE — Telephone Encounter (Signed)
Please call Bradly Chris at [facility] pharmacy back regarding patients Lyrica script.  VM was left on 05/14/19

## 2019-05-25 ENCOUNTER — Encounter: Payer: Self-pay | Admitting: Student in an Organized Health Care Education/Training Program

## 2019-05-26 ENCOUNTER — Ambulatory Visit
Payer: Medicare HMO | Attending: Nurse Practitioner | Admitting: Student in an Organized Health Care Education/Training Program

## 2019-05-26 ENCOUNTER — Other Ambulatory Visit: Payer: Self-pay

## 2019-05-26 ENCOUNTER — Encounter: Payer: Self-pay | Admitting: Student in an Organized Health Care Education/Training Program

## 2019-05-26 DIAGNOSIS — M25552 Pain in left hip: Secondary | ICD-10-CM | POA: Diagnosis not present

## 2019-05-26 DIAGNOSIS — G894 Chronic pain syndrome: Secondary | ICD-10-CM

## 2019-05-26 DIAGNOSIS — Z79891 Long term (current) use of opiate analgesic: Secondary | ICD-10-CM

## 2019-05-26 DIAGNOSIS — M5442 Lumbago with sciatica, left side: Secondary | ICD-10-CM

## 2019-05-26 DIAGNOSIS — M48061 Spinal stenosis, lumbar region without neurogenic claudication: Secondary | ICD-10-CM

## 2019-05-26 DIAGNOSIS — M5441 Lumbago with sciatica, right side: Secondary | ICD-10-CM

## 2019-05-26 DIAGNOSIS — G8929 Other chronic pain: Secondary | ICD-10-CM

## 2019-05-26 MED ORDER — OXYCODONE-ACETAMINOPHEN 10-325 MG PO TABS: 1.0000 | ORAL_TABLET | Freq: Three times a day (TID) | ORAL | 0 refills | Status: DC | PRN

## 2019-05-26 NOTE — Progress Notes (Signed)
Pain Management Virtual Encounter Note - Virtual Visit via Telephone Telehealth (real-time audio visits between healthcare provider and patient).   Patient's Phone No. & Preferred Pharmacy:  [phone] (home); There is no such number on file (mobile).; (Preferred) [phone] No e-mail address on record  [facility] - [place], [place] - [address] [place] [address] Phone: [phone] Fax: [phone]  [facility] - [place], [place] - [address] [place] [id number] Phone: [phone] Fax: [phone]    Pre-screening note:  Our staff contacted [name] and offered him an "in person", "face-to-face" appointment versus a telephone encounter. He indicated preferring the telephone encounter, at this time.   Reason for Virtual Visit: COVID-19*  Social distancing based on CDC and AMA recommendations.   I contacted Logan ProvencalJerry L Montalvo on 05/26/2019 via telephone.      I clearly identified myself as Edward JollyBilal Ranier Coach, MD. I verified that I was speaking with the correct person using two identifiers (Name: Logan ProvencalJerry L Flurry, and date of birth: 08/31/1946).  Advanced Informed Consent I sought verbal advanced consent from Logan ProvencalJerry L Maberry for virtual visit interactions. I informed [name] of possible security and privacy concerns, risks, and limitations associated with providing "not-in-person" medical evaluation and management services. I also informed [name] of the availability of "in-person" appointments. Finally, I informed him that there would be a charge for the virtual visit and that he could be  personally, fully or partially, financially responsible for it. [name] expressed understanding and agreed to proceed.   Historic Elements   [name] is a 73 y.o. year old, male patient evaluated today after his last encounter by our practice on 05/17/2019. [name]  has a past medical history of Arthritis, Asthma, CAD  (coronary artery disease), Depression, Emphysema lung (HCC), Glaucoma, and Hypertension. He also  has a past surgical history that includes heart surgery (07/1999); Hip surgery (Left, 1968); Back surgery (1985); Skin graft (1968); and Humerus fracture surgery (1968). [name] has a current medication list which includes the following prescription(s): albuterol, aspirin ec, atorvastatin, baclofen, cholecalciferol, b-12, docusate sodium, fluoxetine, hydrocortisone, lisinopril, mupirocin cream, nitroglycerin, oxycodone-acetaminophen, oxycodone-acetaminophen, oxycodone-acetaminophen, and pregabalin. He  reports that he has been smoking cigarettes. He has a 116.00 pack-year smoking history. He has never used smokeless tobacco. He reports that he does not drink alcohol or use drugs. [name] is allergic to ibuprofen.   HPI  Today, he is being contacted for medication management.  No change in medical history.  Patient is scheduled for a lumbar epidural steroid injection with me later this month.  He states that his pain is at baseline.  He states that medications help decrease his pain and allow him to function.  He states that he is good on his Lyrica.  Refill Percocet as below.  Pharmacotherapy Assessment   05/17/2019  1   05/17/2019  Pregabalin 75 MG Capsule  60.00 30 Bi Lat   [id number]   [place] ([id number])   0  1.00 LME  Medicare   [place]  05/14/2019  1   03/04/2019  Oxycodone-Acetaminophen 10-325  90.00 30 Cr Kin   [id number]   [place] ([id number])   0  45.00 MME  Medicare   [place]     Monitoring: Pharmacotherapy: No side-effects or adverse reactions reported. [place] PMP: PDMP reviewed during this encounter.       Compliance: No problems identified. Effectiveness: Clinically acceptable.  Plan: Refer to "POC".  Pertinent Labs   SAFETY SCREENING Profile No results found for: SARSCOV2NAA, COVIDSOURCE, STAPHAUREUS, MRSAPCR, HCVAB, HIV, PREGTESTUR Renal Function Lab Results  Component Value Date   BUN 15 10/23/2017    CREATININE 0.92 10/23/2017   BCR NOT APPLICABLE 10/23/2017   Hepatic Function Lab Results  Component Value Date   AST 14 10/23/2017   ALT 10 10/23/2017   UDS Summary  Date Value Ref Range Status  01/05/2019 FINAL  Final    Comment:    ==================================================================== TOXASSURE SELECT 13 (MW) ==================================================================== Test                             Result       Flag       Units Drug Present and Declared for Prescription Verification   Oxycodone                      1254         EXPECTED   ng/mg creat   Oxymorphone                    310          EXPECTED   ng/mg creat   Noroxycodone                   4592         EXPECTED   ng/mg creat   Noroxymorphone                 247          EXPECTED   ng/mg creat    Sources of oxycodone are scheduled prescription medications.    Oxymorphone, noroxycodone, and noroxymorphone are expected    metabolites of oxycodone. Oxymorphone is also available as a    scheduled prescription medication. Drug Present not Declared for Prescription Verification   Alprazolam                     15           UNEXPECTED ng/mg creat   Alpha-hydroxyalprazolam        19           UNEXPECTED ng/mg creat    Source of alprazolam is a scheduled prescription medication.    Alpha-hydroxyalprazolam is an expected metabolite of alprazolam. ==================================================================== Test                      Result    Flag   Units      Ref Range   Creatinine              150              mg/dL      >=24 ==================================================================== Declared Medications:  The flagging and interpretation on this report are based on the  following declared medications.  Unexpected results may arise from  inaccuracies in the declared medications.  **Note: The testing scope of this panel includes these medications:  Oxycodone (Oxycodone  Acetaminophen)  **Note: The testing scope of this panel does not include following  reported medications:  Acetaminophen (Oxycodone Acetaminophen)  Albuterol  Aspirin (Aspirin 81)  Atorvastatin  Baclofen  Celecoxib  Cholecalciferol  Cyanocobalamin  Docusate  Fluoxetine  Hydrocortisone  Lisinopril  Mupirocin  Nitroglycerin  Pregabalin ==================================================================== For clinical consultation, please call [phone]. ====================================================================  Note: Above Lab results reviewed.  Recent imaging  DG HIP UNILAT W OR W/O PELVIS 2-3 VIEWS LEFT CLINICAL DATA:  Chronic left hip pain. Previous injury and surgery to the left hip in the 1960s.  EXAM: DG HIP (WITH OR WITHOUT PELVIS) 2-3V LEFT  COMPARISON:  None.  FINDINGS: Diffuse osteopenia. Old, healed proximal left femur fracture with a probable involuting intramedullary rod tract. Associated corticated bony fragmentation at the superior aspect of the greater trochanter. Lumbosacral spine hardware and bone fusion.  IMPRESSION: No acute abnormality. Old posttraumatic and postsurgical changes, as described above.  Electronically Signed   By: Claudie Revering M.D.   On: 02/18/2019 20:36 CT CHEST LUNG CANCER SCREENING LOW DOSE WO CONTRAST CLINICAL DATA:  73 year old male current smoker, with 116 pack-year history of smoking, for initial lung cancer screening  EXAM: CT CHEST WITHOUT CONTRAST LOW-DOSE FOR LUNG CANCER SCREENING  TECHNIQUE: Multidetector CT imaging of the chest was performed following the standard protocol without IV contrast.  COMPARISON:  None.  FINDINGS: Cardiovascular: The heart is normal in size. No pericardial effusion.  No evidence of thoracic aortic aneurysm. Atherosclerotic calcifications of the aortic arch.  Three vessel coronary atherosclerosis.  Mediastinum/Nodes: No suspicious mediastinal  lymphadenopathy.  Visualized thyroid is unremarkable.  Lungs/Pleura: Mild paraseptal emphysematous changes, upper lobe predominant.  Mild subpleural reticulation/fibrosis in the bilateral lower lobes.  No focal consolidation.  No suspicious pulmonary nodules.  No pleural effusion or pneumothorax.  Upper Abdomen: Visualized upper abdomen is notable for vascular calcifications, prior cholecystectomy, and a 3.4 cm posterior interpolar left renal cyst.  Musculoskeletal: Degenerative changes of the visualized thoracolumbar spine.  IMPRESSION: Lung-RADS 1, negative. Continue annual screening with low-dose chest CT without contrast in 12 months.  Aortic Atherosclerosis (ICD10-I70.0) and Emphysema (ICD10-J43.9).  Electronically Signed   By: Julian Hy M.D.   On: 02/18/2019 15:25  Assessment  The primary encounter diagnosis was Spinal stenosis of lumbar region, unspecified whether neurogenic claudication present. Diagnoses of Chronic bilateral low back pain with bilateral sciatica, Chronic hip pain, left, Long term current use of opiate analgesic, and Chronic pain syndrome were also pertinent to this visit.  Plan of Care  I am having Sonia Side L. Senn start on oxyCODONE-acetaminophen and oxyCODONE-acetaminophen. I am also having him maintain his albuterol, aspirin EC, Cholecalciferol, B-12, nitroGLYCERIN, docusate sodium, atorvastatin, mupirocin cream, hydrocortisone, lisinopril, FLUoxetine, baclofen, pregabalin, and oxyCODONE-acetaminophen.  Continue Lyrica as prescribed, and baclofen as needed.  Refill of Percocet as below.  Follow-up end of this month for established procedure L2-L3 interlaminar ESI.  Pharmacotherapy (Medications Ordered): Meds ordered this encounter  Medications  . oxyCODONE-acetaminophen (PERCOCET) 10-325 MG tablet    Sig: Take 1 tablet by mouth every 8 (eight) hours as needed for up to 30 days for pain. For chronic pain To last for 30 days from fill date     Dispense:  90 tablet    Refill:  0    Do not place this medication, or any other prescription from our practice, on "Automatic Refill". Patient may have prescription filled one day early if pharmacy is closed on scheduled refill date.  [name] oxyCODONE-acetaminophen (PERCOCET) 10-325 MG tablet    Sig: Take 1 tablet by mouth every 8 (eight) hours as needed for up to 30 days for pain. For chronic pain To last for 30 days from fill date    Dispense:  90 tablet    Refill:  0    Do not place this medication, or any other prescription from our  practice, on "Automatic Refill". Patient may have prescription filled one day early if pharmacy is closed on scheduled refill date.  [name]. oxyCODONE-acetaminophen (PERCOCET) 10-325 MG tablet    Sig: Take 1 tablet by mouth every 8 (eight) hours as needed for up to 30 days for pain. For chronic pain To last for 30 days from fill date    Dispense:  90 tablet    Refill:  0    Do not place this medication, or any other prescription from our practice, on "Automatic Refill". Patient may have prescription filled one day early if pharmacy is closed on scheduled refill date.   Orders:  No orders of the defined types were placed in this encounter.  Follow-up plan:   Return in about 3 months (around 08/26/2019) for Medication Management, in person.    I discussed the assessment and treatment plan with the patient. The patient was provided an opportunity to ask questions and all were answered. The patient agreed with the plan and demonstrated an understanding of the instructions.  Patient advised to call back or seek an in-person evaluation if the symptoms or condition worsens.  Total duration of non-face-to-face encounter: 25 minutes.  Note by: Edward JollyBilal Crescencio Jozwiak, MD Date: 05/26/2019; Time: 2:44 PM  Note: This dictation was prepared with Dragon dictation. Any transcriptional errors that may result from this process are unintentional.  Disclaimer:  * Given the special  circumstances of the COVID-19 pandemic, the federal government has announced that the Office for Civil Rights (OCR) will exercise its enforcement discretion and will not impose penalties on physicians using telehealth in the event of noncompliance with regulatory requirements under the [employer] Insurance Portability and Accountability Act (HIPAA) in connection with the good faith provision of telehealth during the COVID-19 national public health emergency. (AMA)

## 2019-06-03 ENCOUNTER — Encounter: Payer: Medicare HMO | Admitting: Nurse Practitioner

## 2019-06-09 ENCOUNTER — Other Ambulatory Visit: Payer: Medicare HMO | Attending: Student in an Organized Health Care Education/Training Program

## 2019-06-14 ENCOUNTER — Ambulatory Visit: Payer: Medicare HMO | Admitting: Student in an Organized Health Care Education/Training Program

## 2019-08-25 ENCOUNTER — Ambulatory Visit
Payer: Medicare HMO | Attending: Student in an Organized Health Care Education/Training Program | Admitting: Student in an Organized Health Care Education/Training Program

## 2019-08-25 ENCOUNTER — Encounter: Payer: Self-pay | Admitting: Student in an Organized Health Care Education/Training Program

## 2019-08-25 ENCOUNTER — Other Ambulatory Visit: Payer: Self-pay

## 2019-08-25 VITALS — BP 143/79 | HR 68 | Temp 98.5°F | Resp 18 | Ht 69.0 in | Wt 142.0 lb

## 2019-08-25 DIAGNOSIS — M5441 Lumbago with sciatica, right side: Secondary | ICD-10-CM

## 2019-08-25 DIAGNOSIS — M5136 Other intervertebral disc degeneration, lumbar region: Secondary | ICD-10-CM

## 2019-08-25 DIAGNOSIS — R531 Weakness: Secondary | ICD-10-CM

## 2019-08-25 DIAGNOSIS — M5442 Lumbago with sciatica, left side: Secondary | ICD-10-CM | POA: Diagnosis not present

## 2019-08-25 DIAGNOSIS — Z8673 Personal history of transient ischemic attack (TIA), and cerebral infarction without residual deficits: Secondary | ICD-10-CM

## 2019-08-25 DIAGNOSIS — M48061 Spinal stenosis, lumbar region without neurogenic claudication: Secondary | ICD-10-CM | POA: Diagnosis not present

## 2019-08-25 DIAGNOSIS — Z79899 Other long term (current) drug therapy: Secondary | ICD-10-CM

## 2019-08-25 DIAGNOSIS — Z981 Arthrodesis status: Secondary | ICD-10-CM

## 2019-08-25 DIAGNOSIS — G8929 Other chronic pain: Secondary | ICD-10-CM

## 2019-08-25 DIAGNOSIS — M21371 Foot drop, right foot: Secondary | ICD-10-CM

## 2019-08-25 DIAGNOSIS — M25552 Pain in left hip: Secondary | ICD-10-CM

## 2019-08-25 DIAGNOSIS — Z79891 Long term (current) use of opiate analgesic: Secondary | ICD-10-CM

## 2019-08-25 DIAGNOSIS — G894 Chronic pain syndrome: Secondary | ICD-10-CM

## 2019-08-25 MED ORDER — OXYCODONE-ACETAMINOPHEN 10-325 MG PO TABS: 1.0000 | ORAL_TABLET | Freq: Three times a day (TID) | ORAL | 0 refills | Status: DC | PRN

## 2019-08-25 MED ORDER — PREGABALIN 75 MG PO CAPS: 150.0000 mg | ORAL_CAPSULE | Freq: Every evening | ORAL | 2 refills | Status: DC | PRN

## 2019-08-25 NOTE — Progress Notes (Signed)
Patient's Name: Logan Vazquez  MRN: [id number]  Referring Provider: Letta Median, MD  DOB: [birth year]  PCP: Letta Median, MD  DOS: 08/25/2019  Note by: Gillis Santa, MD  Service setting: Ambulatory outpatient  Attending: Gillis Santa, MD  Location: [facility] (AMB) Pain Management Facility  Specialty: Interventional Pain Management  Patient type: Established   Primary Reason(s) for Visit: Encounter for prescription drug management. (Level of risk: moderate)  CC: Back Pain (low) and Hip Pain (bilateral)  HPI  Logan Vazquez is a 73 y.o. year old, male patient, who comes today for a medication management evaluation. He has Essential hypertension; Tobacco dependence; Abdominal aortic aneurysm (AAA) without rupture ([place]); Back pain; Depression, recurrent ([place]); COPD (chronic obstructive pulmonary disease) ([place]); Anxiety disorder; Atherosclerotic heart disease of native coronary artery without angina pectoris; History of CVA (cerebrovascular accident); Acquired right foot drop; History of lumbar spinal fusion; Lumbar degenerative disc disease; At high risk for falls; Chronic nausea; Chronic prescription opiate use; Descending thoracic aortic aneurysm ([place]); Enthesopathy of knee; GERD (gastroesophageal reflux disease); Ischemic stroke ([place]); Major depressive disorder, single episode; Pain medication agreement; Recurrent major depressive disorder, in partial remission ([place]); Stable angina ([place]); Tobacco use disorder; Chronic pain syndrome; Long term current use of opiate analgesic; Chronic bilateral low back pain with bilateral sciatica; Chronic hip pain, left; and Spinal stenosis of lumbar region on their problem list. His primarily concern today is the Back Pain (low) and Hip Pain (bilateral)  Pain Assessment: Location: Lower Back Radiating: hips Onset: More than a month ago Duration: Chronic pain Quality: Aching, Constant Severity: 8 /10 (subjective, self-reported pain score)  Note: Reported level  is inconsistent with clinical observations.  Effect on ADL:   Timing: Constant Modifying factors: medications, heat BP: (!) 143/79  HR: 68  Logan Vazquez was last scheduled for an appointment on 05/26/2019 for medication management. During today's appointment we reviewed Logan Vazquez chronic pain status, as well as his outpatient medication regimen.  No change in medical history since last visit.  Patient's pain is at baseline.  Patient continues multimodal pain regimen as prescribed.  States that it provides pain relief and improvement in functional status.  Is endorsing left hip pain that is worse with weightbearing.  He states that he did sustain a fall a couple of days ago but fell back onto his sofa.  He does ambulate with a walker.  No other significant changes in his medical or pain history.   The patient  reports no history of drug use. His body mass index is 20.97 kg/m.  Further details on both, my assessment(s), as well as the proposed treatment plan, please see below.  Controlled Substance Pharmacotherapy Assessment REMS (Risk Evaluation and Mitigation Strategy)  Analgesic: 08/12/2019  1   05/17/2019  Pregabalin 75 MG Capsule  60.00  30 Bi Lat   [id number]   [place] ([id number])   3  1.00 LME  Medicare   [place]  08/12/2019  1   05/26/2019  Oxycodone-Acetaminophen 10-325  90.00  30 Bi Lat   [id number]   [place] ([id number])   0  45.00 MME  Medicare   [place]    Hart Rochester, RN  08/25/2019  1:50 PM  Sign when Signing Visit Nursing Pain Medication Assessment:  Safety precautions to be maintained throughout the outpatient stay will include: orient to surroundings, keep bed in low position, maintain call bell within reach at all times, provide assistance with transfer out of bed and ambulation.  Medication Inspection Compliance:  Pill count conducted under aseptic conditions, in front of the patient. Neither the pills nor the bottle was removed from the patient's sight at any time. Once count was completed pills  were immediately returned to the patient in their original bottle.  Medication: Oxycodone IR Pill/Patch Count: 39 of 90 pills remain Pill/Patch Appearance: Markings consistent with prescribed medication Bottle Appearance: Standard pharmacy container. Clearly labeled. Filled Date: 08 / 27 / 2020 Last Medication intake:  Today   Pharmacokinetics: Liberation and absorption (onset of action): WNL Distribution (time to peak effect): WNL Metabolism and excretion (duration of action): WNL         Pharmacodynamics: Desired effects: Analgesia: Logan Vazquez reports >50% benefit. Functional ability: Patient reports that medication allows him to accomplish basic ADLs Clinically meaningful improvement in function (CMIF): Sustained CMIF goals met Perceived effectiveness: Described as relatively effective, allowing for increase in activities of daily living (ADL) Undesirable effects: Side-effects or Adverse reactions: None reported Monitoring: [place] PMP: PDMP not reviewed this encounter. Online review of the past 54-monthperiod conducted. Compliant with practice rules and regulations Last UDS on record: Summary  Date Value Ref Range Status  01/05/2019 FINAL  Final    Comment:    ==================================================================== TOXASSURE SELECT 13 (MW) ==================================================================== Test                             Result       Flag       Units Drug Present and Declared for Prescription Verification   Oxycodone                      1254         EXPECTED   ng/mg creat   Oxymorphone                    310          EXPECTED   ng/mg creat   Noroxycodone                   4592         EXPECTED   ng/mg creat   Noroxymorphone                 247          EXPECTED   ng/mg creat    Sources of oxycodone are scheduled prescription medications.    Oxymorphone, noroxycodone, and noroxymorphone are expected    metabolites of oxycodone. Oxymorphone is  also available as a    scheduled prescription medication. Drug Present not Declared for Prescription Verification   Alprazolam                     15           UNEXPECTED ng/mg creat   Alpha-hydroxyalprazolam        19           UNEXPECTED ng/mg creat    Source of alprazolam is a scheduled prescription medication.    Alpha-hydroxyalprazolam is an expected metabolite of alprazolam. ==================================================================== Test                      Result    Flag   Units      Ref Range   Creatinine              150  mg/dL      >=20 ==================================================================== Declared Medications:  The flagging and interpretation on this report are based on the  following declared medications.  Unexpected results may arise from  inaccuracies in the declared medications.  **Note: The testing scope of this panel includes these medications:  Oxycodone (Oxycodone Acetaminophen)  **Note: The testing scope of this panel does not include following  reported medications:  Acetaminophen (Oxycodone Acetaminophen)  Albuterol  Aspirin (Aspirin 81)  Atorvastatin  Baclofen  Celecoxib  Cholecalciferol  Cyanocobalamin  Docusate  Fluoxetine  Hydrocortisone  Lisinopril  Mupirocin  Nitroglycerin  Pregabalin ==================================================================== For clinical consultation, please call [phone]. ====================================================================    UDS interpretation: Compliant          Medication Assessment Form: Reviewed. Patient indicates being compliant with therapy Treatment compliance: Compliant Risk Assessment Profile: Aberrant behavior: See initial evaluations. None observed or detected today Comorbid factors increasing risk of overdose: See initial evaluation. No additional risks detected today Opioid risk tool (ORT):  Opioid Risk  01/05/2019  Alcohol 0  Illegal Drugs  0  Rx Drugs 0  Alcohol 0  Illegal Drugs 0  Rx Drugs -  Age between 16-45 years  0  History of Preadolescent Sexual Abuse 0  Psychological Disease 0  Depression 0  Opioid Risk Tool Scoring 0  Opioid Risk Interpretation Low Risk    ORT Scoring interpretation table:  Score <3 = Low Risk for SUD  Score between 4-7 = Moderate Risk for SUD  Score >8 = High Risk for Opioid Abuse   Risk of substance use disorder (SUD): Low  Risk Mitigation Strategies:  Patient Counseling: Covered Patient-Prescriber Agreement (PPA): Present and active  Notification to other healthcare providers: Done  Pharmacologic Plan: No change in therapy, at this time.             Laboratory Chemistry Profile     Renal Lab Results  Component Value Date   BUN 15 10/23/2017   CREATININE 0.92 50/35/4656   BCR NOT APPLICABLE 81/27/5170                             Hepatic Lab Results  Component Value Date   AST 14 10/23/2017   ALT 10 10/23/2017                        Electrolytes Lab Results  Component Value Date   NA 138 10/23/2017   K 4.9 10/23/2017   CL 102 10/23/2017   CALCIUM 9.5 10/23/2017                         Coagulation Lab Results  Component Value Date   PLT 349 10/23/2017                        Cardiovascular Lab Results  Component Value Date   HGB 14.7 10/23/2017   HCT 43.1 10/23/2017                           Endocrine Lab Results  Component Value Date   TSH 1.55 10/23/2017                        Note: Lab results reviewed.  Recent Diagnostic Imaging Results  DG HIP UNILAT W OR W/O PELVIS 2-3 VIEWS LEFT CLINICAL DATA:  Chronic left hip pain. Previous injury and surgery to the left hip in the 1960s.  EXAM: DG HIP (WITH OR WITHOUT PELVIS) 2-3V LEFT  COMPARISON:  None.  FINDINGS: Diffuse osteopenia. Old, healed proximal left femur fracture with a probable involuting intramedullary rod tract. Associated corticated bony fragmentation at the superior aspect of  the greater trochanter. Lumbosacral spine hardware and bone fusion.  IMPRESSION: No acute abnormality. Old posttraumatic and postsurgical changes, as described above.  Electronically Signed   By: Claudie Revering M.D.   On: 02/18/2019 20:36 CT CHEST LUNG CANCER SCREENING LOW DOSE WO CONTRAST CLINICAL DATA:  73 year old male current smoker, with 116 pack-year history of smoking, for initial lung cancer screening  EXAM: CT CHEST WITHOUT CONTRAST LOW-DOSE FOR LUNG CANCER SCREENING  TECHNIQUE: Multidetector CT imaging of the chest was performed following the standard protocol without IV contrast.  COMPARISON:  None.  FINDINGS: Cardiovascular: The heart is normal in size. No pericardial effusion.  No evidence of thoracic aortic aneurysm. Atherosclerotic calcifications of the aortic arch.  Three vessel coronary atherosclerosis.  Mediastinum/Nodes: No suspicious mediastinal lymphadenopathy.  Visualized thyroid is unremarkable.  Lungs/Pleura: Mild paraseptal emphysematous changes, upper lobe predominant.  Mild subpleural reticulation/fibrosis in the bilateral lower lobes.  No focal consolidation.  No suspicious pulmonary nodules.  No pleural effusion or pneumothorax.  Upper Abdomen: Visualized upper abdomen is notable for vascular calcifications, prior cholecystectomy, and a 3.4 cm posterior interpolar left renal cyst.  Musculoskeletal: Degenerative changes of the visualized thoracolumbar spine.  IMPRESSION: Lung-RADS 1, negative. Continue annual screening with low-dose chest CT without contrast in 12 months.  Aortic Atherosclerosis (ICD10-I70.0) and Emphysema (ICD10-J43.9).  Electronically Signed   By: Julian Hy M.D.   On: 02/18/2019 15:25  Complexity Note: Imaging results reviewed. Results shared with Logan Vazquez, using Layman's terms.                               Meds   Current Outpatient Medications:  .  albuterol (PROVENTIL HFA;VENTOLIN HFA) 108  (90 Base) MCG/ACT inhaler, Inhale into the lungs., Disp: , Rfl:  .  aspirin EC 81 MG tablet, Take 81 mg by mouth., Disp: , Rfl:  .  atorvastatin (LIPITOR) 40 MG tablet, Take 1 tablet (40 mg total) by mouth daily., Disp: 30 tablet, Rfl: 3 .  baclofen (LIORESAL) 10 MG tablet, TAKE 1 TABLET TWICE DAILY AS NEEDED FOR HEADACHE OR MUSCLE SPASM, Disp: , Rfl:  .  Cholecalciferol (VITAMIN D-1000 MAX ST) 1000 units tablet, Take by mouth., Disp: , Rfl:  .  Cyanocobalamin (B-12) 2000 MCG TABS, Take by mouth., Disp: , Rfl:  .  docusate sodium (COLACE) 100 MG capsule, Take 100 mg by mouth 2 (two) times daily., Disp: , Rfl:  .  FLUoxetine (PROZAC) 20 MG tablet, Take 1 tablet (20 mg total) by mouth daily., Disp: 30 tablet, Rfl: 0 .  hydrocortisone (ANUSOL-HC) 2.5 % rectal cream, Place 1 application rectally 2 (two) times daily., Disp: 30 g, Rfl: 0 .  lisinopril (PRINIVIL,ZESTRIL) 40 MG tablet, Take 1 tablet (40 mg total) by mouth daily., Disp: 30 tablet, Rfl: 0 .  mupirocin cream (BACTROBAN) 2 %, Apply 1 application topically 2 (two) times daily., Disp: 15 g, Rfl: 0 .  nitroGLYCERIN (NITROSTAT) 0.4 MG SL tablet, Place under the tongue., Disp: , Rfl:  .  [START ON 09/10/2019] oxyCODONE-acetaminophen (PERCOCET) 10-325 MG tablet, Take 1 tablet by mouth every 8 (eight) hours as needed for  pain. For chronic pain To last for 30 days from fill date, Disp: 90 tablet, Rfl: 0 .  [START ON 10/10/2019] oxyCODONE-acetaminophen (PERCOCET) 10-325 MG tablet, Take 1 tablet by mouth every 8 (eight) hours as needed for pain. For chronic pain To last for 30 days from fill date, Disp: 90 tablet, Rfl: 0 .  [START ON 11/09/2019] oxyCODONE-acetaminophen (PERCOCET) 10-325 MG tablet, Take 1 tablet by mouth every 8 (eight) hours as needed for pain. For chronic pain To last for 30 days from fill date, Disp: 90 tablet, Rfl: 0 .  pregabalin (LYRICA) 75 MG capsule, Take 2 capsules (150 mg total) by mouth at bedtime as needed., Disp: 60 capsule,  Rfl: 2  ROS  Constitutional: Denies any fever or chills Gastrointestinal: No reported hemesis, hematochezia, vomiting, or acute GI distress Musculoskeletal: Denies any acute onset joint swelling, redness, loss of ROM, or weakness Neurological: No reported episodes of acute onset apraxia, aphasia, dysarthria, agnosia, amnesia, paralysis, loss of coordination, or loss of consciousness  Allergies  Logan Vazquez is allergic to ibuprofen.  [place]  Drug: Logan Vazquez  reports no history of drug use. Alcohol:  reports no history of alcohol use. Tobacco:  reports that he has been smoking cigarettes. He has a 116.00 pack-year smoking history. He has never used smokeless tobacco. Medical:  has a past medical history of Arthritis, Asthma, CAD (coronary artery disease), Depression, Emphysema lung ([place]), Glaucoma, and Hypertension. Surgical: Logan Vazquez  has a past surgical history that includes heart surgery (07/1999); Hip surgery (Left, 1968); Back surgery (1985); Skin graft (1968); and Humerus fracture surgery (1968). Family: family history includes Cancer in his father; Hearing loss in his mother; Heart attack in his brother; Heart disease in his brother; Hypertension in his father and mother; Stroke in his sister.  Constitutional Exam  General appearance: Well nourished, well developed, and well hydrated. In no apparent acute distress Vitals:   08/25/19 1344  BP: (!) 143/79  Pulse: 68  Resp: 18  Temp: 98.5 F (36.9 C)  TempSrc: Oral  SpO2: 96%  Weight: 142 lb (64.4 kg)  Height: _0  (1.753 m)   BMI Assessment: Estimated body mass index is 20.97 kg/m as calculated from the following:   Height as of this encounter: _1  (1.753 m).   Weight as of this encounter: 142 lb (64.4 kg).  BMI interpretation table: BMI level Category Range association with higher incidence of chronic pain  <18 kg/m2 Underweight   18.5-24.9 kg/m2 Ideal body weight   25-29.9 kg/m2 Overweight Increased incidence by 20%   30-34.9 kg/m2 Obese (Class I) Increased incidence by 68%  35-39.9 kg/m2 Severe obesity (Class II) Increased incidence by 136%  >40 kg/m2 Extreme obesity (Class III) Increased incidence by 254%   Patient's current BMI Ideal Body weight  Body mass index is 20.97 kg/m. Ideal body weight: 70.7 kg (155 lb 13.8 oz)   BMI Readings from Last 4 Encounters:  08/25/19 20.97 kg/m  03/04/19 21.41 kg/m  02/18/19 21.41 kg/m  01/05/19 22.15 kg/m   Wt Readings from Last 4 Encounters:  08/25/19 142 lb (64.4 kg)  03/04/19 145 lb (65.8 kg)  02/18/19 145 lb (65.8 kg)  01/05/19 150 lb (68 kg)  Psych/Mental status: Alert, oriented x 3 (person, place, & time)       Eyes: PERLA Respiratory: No evidence of acute respiratory distress  Cervical Spine Area Exam  Skin & Axial Inspection: No masses, redness, edema, swelling, or associated skin lesions Alignment: Symmetrical Functional ROM: Unrestricted  ROM      Stability: No instability detected Muscle Tone/Strength: Functionally intact. No obvious neuro-muscular anomalies detected. Sensory (Neurological): Unimpaired Palpation: No palpable anomalies              Upper Extremity (UE) Exam    Side: Right upper extremity  Side: Left upper extremity  Skin & Extremity Inspection: Skin color, temperature, and hair growth are WNL. No peripheral edema or cyanosis. No masses, redness, swelling, asymmetry, or associated skin lesions. No contractures.  Skin & Extremity Inspection: Skin color, temperature, and hair growth are WNL. No peripheral edema or cyanosis. No masses, redness, swelling, asymmetry, or associated skin lesions. No contractures.  Functional ROM: Unrestricted ROM          Functional ROM: Unrestricted ROM          Muscle Tone/Strength: Functionally intact. No obvious neuro-muscular anomalies detected.  Muscle Tone/Strength: Functionally intact. No obvious neuro-muscular anomalies detected.  Sensory (Neurological): Unimpaired          Sensory  (Neurological): Unimpaired          Palpation: No palpable anomalies              Palpation: No palpable anomalies              Provocative Test(s):  Phalen's test: deferred Tinel's test: deferred Apley's scratch test (touch opposite shoulder):  Action 1 (Across chest): deferred Action 2 (Overhead): deferred Action 3 (LB reach): deferred   Provocative Test(s):  Phalen's test: deferred Tinel's test: deferred Apley's scratch test (touch opposite shoulder):  Action 1 (Across chest): deferred Action 2 (Overhead): deferred Action 3 (LB reach): deferred    Thoracic Spine Area Exam  Skin & Axial Inspection: No masses, redness, or swelling Alignment: Symmetrical Functional ROM: Unrestricted ROM Stability: No instability detected Muscle Tone/Strength: Functionally intact. No obvious neuro-muscular anomalies detected. Sensory (Neurological): Musculoskeletal pain pattern Muscle strength & Tone: No palpable anomalies  Lumbar Spine Area Exam  Skin & Axial Inspection: Well healed scar from previous spine surgery detected Alignment: Symmetrical Functional ROM: Decreased ROM       Stability: No instability detected Muscle Tone/Strength: Functionally intact. No obvious neuro-muscular anomalies detected. Sensory (Neurological): Dermatomal pain pattern and musculoskeletal Palpation: Complains of area being tender to palpation         Gait & Posture Assessment  Ambulation: Patient ambulates using a walker Gait: Antalgic gait (limping) Posture: Difficulty standing up straight, due to pain   Lower Extremity Exam    Side: Right lower extremity  Side: Left lower extremity  Stability: No instability observed          Stability: No instability observed          Skin & Extremity Inspection: Skin color, temperature, and hair growth are WNL. No peripheral edema or cyanosis. No masses, redness, swelling, asymmetry, or associated skin lesions. No contractures.  Skin & Extremity Inspection: Skin color,  temperature, and hair growth are WNL. No peripheral edema or cyanosis. No masses, redness, swelling, asymmetry, or associated skin lesions. No contractures.  Functional ROM: Decreased ROM for hip and knee joints          Functional ROM: Decreased ROM for all joints of the lower extremity          Muscle Tone/Strength: Functionally intact. No obvious neuro-muscular anomalies detected.  Muscle Tone/Strength: Functionally intact. No obvious neuro-muscular anomalies detected.  Sensory (Neurological): Dermatomal pain pattern top of foot (L5)  Sensory (Neurological): Dermatomal pain pattern medial portion of foot (L4)  DTR: Patellar: deferred today Achilles: deferred today Plantar: deferred today  DTR: Patellar: deferred today Achilles: deferred today Plantar: deferred today  Palpation: No palpable anomalies  Palpation: No palpable anomalies   Assessment   Status Diagnosis  Controlled Controlled Controlled 1. Spinal stenosis of lumbar region, unspecified whether neurogenic claudication present   2. Chronic bilateral low back pain with bilateral sciatica   3. Chronic hip pain, left   4. Long term current use of opiate analgesic   5. Chronic pain syndrome   6. Lumbar degenerative disc disease   7. History of CVA (cerebrovascular accident)   8. Right sided weakness   9. Acquired right foot drop   10. History of lumbar spinal fusion   11. Controlled substance agreement signed       Plan of Care  Pharmacotherapy (Medications Ordered): Meds ordered this encounter  Medications  . oxyCODONE-acetaminophen (PERCOCET) 10-325 MG tablet    Sig: Take 1 tablet by mouth every 8 (eight) hours as needed for pain. For chronic pain To last for 30 days from fill date    Dispense:  90 tablet    Refill:  0    Do not place this medication, or any other prescription from our practice, on "Automatic Refill". Patient may have prescription filled one day early if pharmacy is closed on scheduled refill date.   [name] oxyCODONE-acetaminophen (PERCOCET) 10-325 MG tablet    Sig: Take 1 tablet by mouth every 8 (eight) hours as needed for pain. For chronic pain To last for 30 days from fill date    Dispense:  90 tablet    Refill:  0    Do not place this medication, or any other prescription from our practice, on "Automatic Refill". Patient may have prescription filled one day early if pharmacy is closed on scheduled refill date.  [name] oxyCODONE-acetaminophen (PERCOCET) 10-325 MG tablet    Sig: Take 1 tablet by mouth every 8 (eight) hours as needed for pain. For chronic pain To last for 30 days from fill date    Dispense:  90 tablet    Refill:  0    Do not place this medication, or any other prescription from our practice, on "Automatic Refill". Patient may have prescription filled one day early if pharmacy is closed on scheduled refill date.  . pregabalin (LYRICA) 75 MG capsule    Sig: Take 2 capsules (150 mg total) by mouth at bedtime as needed.    Dispense:  60 capsule    Refill:  2    Planned follow-up:   Return in about 3 months (around 11/24/2019) for Medication Management, in person.    Recent Visits No visits were found meeting these conditions.  Showing recent visits within past 90 days and meeting all other requirements   Today's Visits Date Type Provider Dept  08/25/19 Office Visit Gillis Santa, MD [facility]  Showing today's visits and meeting all other requirements   Future Appointments Date Type Provider Dept  11/23/19 Appointment Gillis Santa, MD [facility]  Showing future appointments within next 90 days and meeting all other requirements   Primary Care Physician: Letta Median, MD Location: [facility] Note by: Gillis Santa, MD Date: 08/25/2019; Time: 2:20 PM  Note: This dictation was prepared with Dragon dictation. Any transcriptional errors that may result from this process are unintentional.

## 2019-08-25 NOTE — Progress Notes (Signed)
Nursing Pain Medication Assessment:  Safety precautions to be maintained throughout the outpatient stay will include: orient to surroundings, keep bed in low position, maintain call bell within reach at all times, provide assistance with transfer out of bed and ambulation.  Medication Inspection Compliance: Pill count conducted under aseptic conditions, in front of the patient. Neither the pills nor the bottle was removed from the patient's sight at any time. Once count was completed pills were immediately returned to the patient in their original bottle.  Medication: Oxycodone IR Pill/Patch Count: 39 of 90 pills remain Pill/Patch Appearance: Markings consistent with prescribed medication Bottle Appearance: Standard pharmacy container. Clearly labeled. Filled Date: 08 / 27 / 2020 Last Medication intake:  Today

## 2019-11-22 ENCOUNTER — Encounter: Payer: Self-pay | Admitting: Student in an Organized Health Care Education/Training Program

## 2019-11-23 ENCOUNTER — Ambulatory Visit
Payer: Medicare HMO | Attending: Student in an Organized Health Care Education/Training Program | Admitting: Student in an Organized Health Care Education/Training Program

## 2019-11-23 ENCOUNTER — Other Ambulatory Visit: Payer: Self-pay

## 2019-11-23 ENCOUNTER — Encounter: Payer: Self-pay | Admitting: Student in an Organized Health Care Education/Training Program

## 2019-11-23 DIAGNOSIS — Z981 Arthrodesis status: Secondary | ICD-10-CM

## 2019-11-23 DIAGNOSIS — Z79899 Other long term (current) drug therapy: Secondary | ICD-10-CM

## 2019-11-23 DIAGNOSIS — G894 Chronic pain syndrome: Secondary | ICD-10-CM

## 2019-11-23 DIAGNOSIS — Z79891 Long term (current) use of opiate analgesic: Secondary | ICD-10-CM

## 2019-11-23 DIAGNOSIS — M48061 Spinal stenosis, lumbar region without neurogenic claudication: Secondary | ICD-10-CM

## 2019-11-23 DIAGNOSIS — G8929 Other chronic pain: Secondary | ICD-10-CM

## 2019-11-23 DIAGNOSIS — M25552 Pain in left hip: Secondary | ICD-10-CM | POA: Diagnosis not present

## 2019-11-23 DIAGNOSIS — Z8673 Personal history of transient ischemic attack (TIA), and cerebral infarction without residual deficits: Secondary | ICD-10-CM

## 2019-11-23 DIAGNOSIS — M5136 Other intervertebral disc degeneration, lumbar region: Secondary | ICD-10-CM

## 2019-11-23 DIAGNOSIS — R531 Weakness: Secondary | ICD-10-CM

## 2019-11-23 DIAGNOSIS — M21371 Foot drop, right foot: Secondary | ICD-10-CM

## 2019-11-23 MED ORDER — OXYCODONE-ACETAMINOPHEN 10-325 MG PO TABS: 1.0000 | ORAL_TABLET | Freq: Three times a day (TID) | ORAL | 0 refills | Status: DC | PRN

## 2019-11-23 NOTE — Progress Notes (Signed)
Pain Management Virtual Encounter Note - Virtual Visit via Telephone Telehealth (real-time audio visits between healthcare provider and patient).   Patient's Phone No. & Preferred Pharmacy:  [phone] (home); There is no such number on file (mobile).; (Preferred) [phone] No e-mail address on record  [place], [place] [address] [place] [address] Phone: [phone] Fax: [facility] [place], [place] [address] Phone: [phone] Fax: [phone]    Pre-screening note:  Our staff contacted Mr. Turnage and offered him an "in person", "face-to-face" appointment versus a telephone encounter. He indicated preferring the telephone encounter, at this time.   Reason for Virtual Visit: COVID-19*  Social distancing based on CDC and AMA recommendations.   I contacted Logan Vazquez on 11/23/2019 via telephone.      I clearly identified myself as Gillis Santa, MD. I verified that I was speaking with the correct person using two identifiers (Name: Logan Vazquez, and date of birth: 1945-12-21).  Advanced Informed Consent I sought verbal advanced consent from Logan Vazquez for virtual visit interactions. I informed Logan Vazquez of possible security and privacy concerns, risks, and limitations associated with providing "not-in-person" medical evaluation and management services. I also informed Logan Vazquez of the availability of "in-person" appointments. Finally, I informed him that there would be a charge for the virtual visit and that he could be  personally, fully or partially, financially responsible for it. Logan Vazquez expressed understanding and agreed to proceed.   Historic Elements   Logan Vazquez is a 73 y.o. year old, male patient evaluated today after his last encounter by our practice on 08/25/2019. Logan Vazquez  has a past medical history of Arthritis, Asthma, CAD  (coronary artery disease), Depression, Emphysema lung ([place]), Glaucoma, and Hypertension. He also  has a past surgical history that includes heart surgery (07/1999); Hip surgery (Left, 1968); Back surgery (1985); Skin graft (1968); and Humerus fracture surgery (1968). [name] has a current medication list which includes the following prescription(s): albuterol, aspirin ec, atorvastatin, baclofen, cholecalciferol, b-12, docusate sodium, fluoxetine, hydrocortisone, lisinopril, mupirocin cream, nitroglycerin, oxycodone-acetaminophen, oxycodone-acetaminophen, and pregabalin. He  reports that he has been smoking cigarettes. He has a 116.00 pack-year smoking history. He has never used smokeless tobacco. He reports that he does not drink alcohol or use drugs. Logan Vazquez is allergic to ibuprofen.   HPI  Today, he is being contacted for medication management.  No change in medical history since last visit.  Patient's pain is at baseline.  Patient continues multimodal pain regimen as prescribed.  States that it provides pain relief and improvement in functional status.  Pharmacotherapy Assessment  Analgesic:  11/09/2019  1   08/25/2019  Oxycodone-Acetaminophen 10-325  90.00  30 Bi Lat   [id number]   [place] (1669)   0  45.00 MME  Medicare   [facility]    Monitoring: Pharmacotherapy: No side-effects or adverse reactions reported. [place] PMP: PDMP reviewed during this encounter.       Compliance: No problems identified. Effectiveness: Clinically acceptable. Plan: Refer to "POC".  UDS:  Summary  Date Value Ref Range Status  01/05/2019 FINAL  Final    Comment:    ==================================================================== TOXASSURE SELECT 13 (MW) ==================================================================== Test  Result       Flag       Units Drug Present and Declared for Prescription Verification   Oxycodone                      1254         EXPECTED   ng/mg creat    Oxymorphone                    310          EXPECTED   ng/mg creat   Noroxycodone                   4592         EXPECTED   ng/mg creat   Noroxymorphone                 247          EXPECTED   ng/mg creat    Sources of oxycodone are scheduled prescription medications.    Oxymorphone, noroxycodone, and noroxymorphone are expected    metabolites of oxycodone. Oxymorphone is also available as a    scheduled prescription medication. Drug Present not Declared for Prescription Verification   Alprazolam                     15           UNEXPECTED ng/mg creat   Alpha-hydroxyalprazolam        19           UNEXPECTED ng/mg creat    Source of alprazolam is a scheduled prescription medication.    Alpha-hydroxyalprazolam is an expected metabolite of alprazolam. ==================================================================== Test                      Result    Flag   Units      Ref Range   Creatinine              150              mg/dL      >=42 ==================================================================== Declared Medications:  The flagging and interpretation on this report are based on the  following declared medications.  Unexpected results may arise from  inaccuracies in the declared medications.  **Note: The testing scope of this panel includes these medications:  Oxycodone (Oxycodone Acetaminophen)  **Note: The testing scope of this panel does not include following  reported medications:  Acetaminophen (Oxycodone Acetaminophen)  Albuterol  Aspirin (Aspirin 81)  Atorvastatin  Baclofen  Celecoxib  Cholecalciferol  Cyanocobalamin  Docusate  Fluoxetine  Hydrocortisone  Lisinopril  Mupirocin  Nitroglycerin  Pregabalin ==================================================================== For clinical consultation, please call [phone]. ====================================================================    Laboratory Chemistry Profile (12 mo)  Renal: No results found  for requested labs within last 8760 hours.  No results found for: GFR, GFRAA, GFRNONAA Hepatic: No results found for requested labs within last 8760 hours. Lab Results  Component Value Date   AST 14 10/23/2017   ALT 10 10/23/2017   Other: No results found for requested labs within last 8760 hours. Note: Above Lab results reviewed.  Imaging  DG HIP UNILAT W OR W/O PELVIS 2-3 VIEWS LEFT CLINICAL DATA:  Chronic left hip pain. Previous injury and surgery to the left hip in the 1960s.  EXAM: DG HIP (WITH OR WITHOUT PELVIS) 2-3V LEFT  COMPARISON:  None.  FINDINGS: Diffuse osteopenia. Old, healed proximal left femur  fracture with a probable involuting intramedullary rod tract. Associated corticated bony fragmentation at the superior aspect of the greater trochanter. Lumbosacral spine hardware and bone fusion.  IMPRESSION: No acute abnormality. Old posttraumatic and postsurgical changes, as described above.  Electronically Signed   By: Beckie SaltsSteven  Reid M.D.   On: 02/18/2019 20:36 CT CHEST LUNG CANCER SCREENING LOW DOSE WO CONTRAST CLINICAL DATA:  73 year old male current smoker, with 116 pack-year history of smoking, for initial lung cancer screening  EXAM: CT CHEST WITHOUT CONTRAST LOW-DOSE FOR LUNG CANCER SCREENING  TECHNIQUE: Multidetector CT imaging of the chest was performed following the standard protocol without IV contrast.  COMPARISON:  None.  FINDINGS: Cardiovascular: The heart is normal in size. No pericardial effusion.  No evidence of thoracic aortic aneurysm. Atherosclerotic calcifications of the aortic arch.  Three vessel coronary atherosclerosis.  Mediastinum/Nodes: No suspicious mediastinal lymphadenopathy.  Visualized thyroid is unremarkable.  Lungs/Pleura: Mild paraseptal emphysematous changes, upper lobe predominant.  Mild subpleural reticulation/fibrosis in the bilateral lower lobes.  No focal consolidation.  No suspicious pulmonary  nodules.  No pleural effusion or pneumothorax.  Upper Abdomen: Visualized upper abdomen is notable for vascular calcifications, prior cholecystectomy, and a 3.4 cm posterior interpolar left renal cyst.  Musculoskeletal: Degenerative changes of the visualized thoracolumbar spine.  IMPRESSION: Lung-RADS 1, negative. Continue annual screening with low-dose chest CT without contrast in 12 months.  Aortic Atherosclerosis (ICD10-I70.0) and Emphysema (ICD10-J43.9).  Electronically Signed   By: Charline BillsSriyesh  Krishnan M.D.   On: 02/18/2019 15:25   Assessment  The primary encounter diagnosis was Spinal stenosis of lumbar region, unspecified whether neurogenic claudication present. Diagnoses of Chronic hip pain, left, Long term current use of opiate analgesic, History of CVA (cerebrovascular accident), History of lumbar spinal fusion, Right sided weakness, Chronic pain syndrome, Acquired right foot drop, Lumbar degenerative disc disease, and Controlled substance agreement signed were also pertinent to this visit.  Plan of Care  I am having Logan Vazquez start on oxyCODONE-acetaminophen. I am also having him maintain his albuterol, aspirin EC, Cholecalciferol, B-12, nitroGLYCERIN, docusate sodium, atorvastatin, mupirocin cream, hydrocortisone, lisinopril, FLUoxetine, baclofen, pregabalin, and oxyCODONE-acetaminophen.   1.  Continue baclofen, Prozac, Lyrica as prescribed.  Pharmacotherapy (Medications Ordered): Meds ordered this encounter  Medications  . oxyCODONE-acetaminophen (PERCOCET) 10-325 MG tablet    Sig: Take 1 tablet by mouth every 8 (eight) hours as needed for pain. For chronic pain To last for 30 days from fill date    Dispense:  90 tablet    Refill:  0    Do not place this medication, or any other prescription from our practice, on "Automatic Refill". Patient may have prescription filled one day early if pharmacy is closed on scheduled refill date.  [name]. oxyCODONE-acetaminophen  (PERCOCET) 10-325 MG tablet    Sig: Take 1 tablet by mouth every 8 (eight) hours as needed for pain. For chronic pain To last for 30 days from fill date    Dispense:  90 tablet    Refill:  0    Do not place this medication, or any other prescription from our practice, on "Automatic Refill". Patient may have prescription filled one day early if pharmacy is closed on scheduled refill date.    Follow-up plan:   Return in about 10 weeks (around 02/01/2020) for Medication Management, in person, (UDS).     Recent Visits Date Type Provider Dept  08/25/19 Office Visit Edward JollyLateef, Tavon Corriher, MD [facility]  Showing recent visits within past 90 days and meeting all  other requirements   Today's Visits Date Type Provider Dept  11/23/19 Office Visit Edward Jolly, MD [facility]  Showing today's visits and meeting all other requirements   Future Appointments No visits were found meeting these conditions.  Showing future appointments within next 90 days and meeting all other requirements   I discussed the assessment and treatment plan with the patient. The patient was provided an opportunity to ask questions and all were answered. The patient agreed with the plan and demonstrated an understanding of the instructions.  Patient advised to call back or seek an in-person evaluation if the symptoms or condition worsens.  Total duration of non-face-to-face encounter:25 minutes.  Note by: Edward Jolly, MD Date: 11/23/2019; Time: 1:58 PM  Note: This dictation was prepared with Dragon dictation. Any transcriptional errors that may result from this process are unintentional.  Disclaimer:  * Given the special circumstances of the COVID-19 pandemic, the federal government has announced that the Office for Civil Rights (OCR) will exercise its enforcement discretion and will not impose penalties on physicians using telehealth in the event of noncompliance with regulatory requirements under the  DIRECT[employer] Portability and Accountability Act (HIPAA) in connection with the good faith provision of telehealth during the COVID-19 national public health emergency. (AMA)

## 2020-02-01 ENCOUNTER — Encounter: Payer: Medicare HMO | Admitting: Student in an Organized Health Care Education/Training Program

## 2020-02-02 ENCOUNTER — Encounter: Payer: Medicare HMO | Admitting: Student in an Organized Health Care Education/Training Program

## 2020-02-02 ENCOUNTER — Encounter: Payer: Self-pay | Admitting: Student in an Organized Health Care Education/Training Program

## 2020-02-03 ENCOUNTER — Ambulatory Visit
Payer: Medicare HMO | Attending: Student in an Organized Health Care Education/Training Program | Admitting: Student in an Organized Health Care Education/Training Program

## 2020-02-03 ENCOUNTER — Other Ambulatory Visit: Payer: Self-pay

## 2020-02-03 ENCOUNTER — Encounter: Payer: Self-pay | Admitting: Student in an Organized Health Care Education/Training Program

## 2020-02-03 DIAGNOSIS — Z79891 Long term (current) use of opiate analgesic: Secondary | ICD-10-CM

## 2020-02-03 DIAGNOSIS — G894 Chronic pain syndrome: Secondary | ICD-10-CM

## 2020-02-03 DIAGNOSIS — M21371 Foot drop, right foot: Secondary | ICD-10-CM

## 2020-02-03 DIAGNOSIS — R531 Weakness: Secondary | ICD-10-CM

## 2020-02-03 DIAGNOSIS — M25552 Pain in left hip: Secondary | ICD-10-CM | POA: Diagnosis not present

## 2020-02-03 DIAGNOSIS — M5442 Lumbago with sciatica, left side: Secondary | ICD-10-CM

## 2020-02-03 DIAGNOSIS — M48061 Spinal stenosis, lumbar region without neurogenic claudication: Secondary | ICD-10-CM | POA: Diagnosis not present

## 2020-02-03 DIAGNOSIS — Z981 Arthrodesis status: Secondary | ICD-10-CM

## 2020-02-03 DIAGNOSIS — Z8673 Personal history of transient ischemic attack (TIA), and cerebral infarction without residual deficits: Secondary | ICD-10-CM | POA: Diagnosis not present

## 2020-02-03 DIAGNOSIS — M5441 Lumbago with sciatica, right side: Secondary | ICD-10-CM

## 2020-02-03 DIAGNOSIS — G8929 Other chronic pain: Secondary | ICD-10-CM

## 2020-02-03 MED ORDER — OXYCODONE-ACETAMINOPHEN 10-325 MG PO TABS: 1.0000 | ORAL_TABLET | Freq: Three times a day (TID) | ORAL | 0 refills | Status: DC | PRN

## 2020-02-03 MED ORDER — TIZANIDINE HCL 4 MG PO TABS: 4.0000 mg | ORAL_TABLET | Freq: Every evening | ORAL | 2 refills | Status: DC | PRN

## 2020-02-03 MED ORDER — PREGABALIN 75 MG PO CAPS: 150.0000 mg | ORAL_CAPSULE | Freq: Every evening | ORAL | 2 refills | Status: DC | PRN

## 2020-02-03 NOTE — Progress Notes (Signed)
Patient: Logan Vazquez  Service Category: E/M  Provider: Gillis Santa, MD  DOB: [birth year]  DOS: 02/03/2020  Location: Office  MRN: [id number]  Setting: Ambulatory outpatient  Referring Provider: Letta Median, MD  Type: Established Patient  Specialty: Interventional Pain Management  PCP: Letta Median, MD  Location: Home  Delivery: TeleHealth     Virtual Encounter - Pain Management PROVIDER NOTE: Information contained herein reflects review and annotations entered in association with encounter. Interpretation of such information and data should be left to medically-trained personnel. Information provided to patient can be located elsewhere in the medical record under "Patient Instructions". Document created using STT-dictation technology, any transcriptional errors that may result from process are unintentional.    Contact & Pharmacy Preferred: [phone] Home: [phone] (home) Mobile: There is no such number on file (mobile). E-mail: No e-mail address on record  [place], [place] [address] [place] [address] Phone: [phone] Fax: [facility] [place], [place] [address] Phone: [phone] Fax: [phone]   Pre-screening  Logan Vazquez offered "in-person" vs "virtual" encounter. He indicated preferring virtual for this encounter.   Reason COVID-19*  Social distancing based on CDC and AMA recommendations.   I contacted Logan Vazquez on 02/03/2020 via telephone.      I clearly identified myself as Gillis Santa, MD. I verified that I was speaking with the correct person using two identifiers (Name: Logan Vazquez, and date of birth: Oct 26, 1946). This visit was completed via telephone due to the restrictions of the COVID-19 pandemic. All issues as above were discussed and addressed but no physical exam was performed. If it was felt that the patient  should be evaluated in the office, they were directed there. The patient verbally consented to this visit. Patient was unable to complete an audio/visual visit due to Technical difficulties and/or Lack of internet. Due to the catastrophic nature of the COVID-19 pandemic, this visit was done through audio contact only.  Location of the patient: home address (see Epic for details)  Location of the provider: office Consent I sought verbal advanced consent from Logan Vazquez for virtual visit interactions. I informed Logan Vazquez of possible security and privacy concerns, risks, and limitations associated with providing "not-in-person" medical evaluation and management services. I also informed Logan Vazquez of the availability of "in-person" appointments. Finally, I informed him that there would be a charge for the virtual visit and that he could be  personally, fully or partially, financially responsible for it. Logan Vazquez expressed understanding and agreed to proceed.   Historic Elements   Logan Vazquez is a 74 y.o. year old, male patient evaluated today after his last contact with our practice on 11/23/2019. Logan Vazquez  has a past medical history of Arthritis, Asthma, CAD (coronary artery disease), Depression, Emphysema lung ([place]), Glaucoma, and Hypertension. He also  has a past surgical history that includes heart surgery (07/1999); Hip surgery (Left, 1968); Back surgery (1985); Skin graft (1968); and Humerus fracture surgery (1968). Logan Vazquez has a current medication list which includes the following prescription(s): albuterol, aspirin ec, atorvastatin, buspirone, cholecalciferol, b-12, docusate sodium, fluoxetine, hydrocortisone, lisinopril, mupirocin cream, nitroglycerin, [START ON 02/05/2020] oxycodone-acetaminophen, [START ON 03/06/2020] oxycodone-acetaminophen, [START ON 04/05/2020] oxycodone-acetaminophen, pregabalin, and tizanidine. He  reports that he has been smoking cigarettes. He has a 116.00  pack-year smoking history. He has  never used smokeless tobacco. He reports that he does not drink alcohol or use drugs. Logan Vazquez is allergic to ibuprofen.   HPI  Today, he is being contacted for medication management.   No change in medical history since last visit.  Patient's pain is at baseline.  Patient continues multimodal pain regimen as prescribed.  States that it provides pain relief and improvement in functional status.  Pharmacotherapy Assessment  Analgesic: 01/06/2020  1   11/23/2019  Oxycodone-Acetaminophen 10-325  90.00  30 Bi Lat   [id number]   [place] (1669)   0  45.00 MME  Medicare   [place]   Monitoring: [place] PMP: PDMP not reviewed this encounter.       Pharmacotherapy: No side-effects or adverse reactions reported. Compliance: No problems identified. Effectiveness: Clinically acceptable. Plan: Refer to "POC".  UDS:  Summary  Date Value Ref Range Status  01/05/2019 FINAL  Final    Comment:    ==================================================================== TOXASSURE SELECT 13 (MW) ==================================================================== Test                             Result       Flag       Units Drug Present and Declared for Prescription Verification   Oxycodone                      1254         EXPECTED   ng/mg creat   Oxymorphone                    310          EXPECTED   ng/mg creat   Noroxycodone                   4592         EXPECTED   ng/mg creat   Noroxymorphone                 247          EXPECTED   ng/mg creat    Sources of oxycodone are scheduled prescription medications.    Oxymorphone, noroxycodone, and noroxymorphone are expected    metabolites of oxycodone. Oxymorphone is also available as a    scheduled prescription medication. Drug Present not Declared for Prescription Verification   Alprazolam                     15           UNEXPECTED ng/mg creat   Alpha-hydroxyalprazolam        19           UNEXPECTED ng/mg creat    Source of  alprazolam is a scheduled prescription medication.    Alpha-hydroxyalprazolam is an expected metabolite of alprazolam. ==================================================================== Test                      Result    Flag   Units      Ref Range   Creatinine              150              mg/dL      >=20 ==================================================================== Declared Medications:  The flagging and interpretation on this report are based on the  following declared medications.  Unexpected results may arise from  inaccuracies in the declared medications.  **Note: The  testing scope of this panel includes these medications:  Oxycodone (Oxycodone Acetaminophen)  **Note: The testing scope of this panel does not include following  reported medications:  Acetaminophen (Oxycodone Acetaminophen)  Albuterol  Aspirin (Aspirin 81)  Atorvastatin  Baclofen  Celecoxib  Cholecalciferol  Cyanocobalamin  Docusate  Fluoxetine  Hydrocortisone  Lisinopril  Mupirocin  Nitroglycerin  Pregabalin ==================================================================== For clinical consultation, please call [phone]. ====================================================================    UDS positive for alprazolam.  Patient states that he did take 1 Xanax a couple nights prior to his UDS to help him sleep.  This is not a regular medication that he has.  PMP was checked and does not show fill of alprazolam.  This was the patient's morning.  I informed him not to utilize any benzodiazepine specifically Xanax while he is on oxycodone that I am prescribing him.  Laboratory Chemistry Profile   Renal Lab Results  Component Value Date   BUN 15 10/23/2017   CREATININE 0.92 19/41/7408   BCR NOT APPLICABLE 14/48/1856    Hepatic Lab Results  Component Value Date   AST 14 10/23/2017   ALT 10 10/23/2017    Electrolytes Lab Results  Component Value Date   NA 138 10/23/2017   K 4.9  10/23/2017   CL 102 10/23/2017   CALCIUM 9.5 10/23/2017    Bone No results found for: VD25OH, VD125OH2TOT, DJ4970YO3, ZC5885OY7, 25OHVITD1, 25OHVITD2, 25OHVITD3, TESTOFREE, TESTOSTERONE  Inflammation (CRP: Acute Phase) (ESR: Chronic Phase) No results found for: CRP, ESRSEDRATE, LATICACIDVEN    Note: Above Lab results reviewed.  Imaging  DG HIP UNILAT W OR W/O PELVIS 2-3 VIEWS LEFT CLINICAL DATA:  Chronic left hip pain. Previous injury and surgery to the left hip in the 1960s.  EXAM: DG HIP (WITH OR WITHOUT PELVIS) 2-3V LEFT  COMPARISON:  None.  FINDINGS: Diffuse osteopenia. Old, healed proximal left femur fracture with a probable involuting intramedullary rod tract. Associated corticated bony fragmentation at the superior aspect of the greater trochanter. Lumbosacral spine hardware and bone fusion.  IMPRESSION: No acute abnormality. Old posttraumatic and postsurgical changes, as described above.  Electronically Signed   By: Claudie Revering M.D.   On: 02/18/2019 20:36 CT CHEST LUNG CANCER SCREENING LOW DOSE WO CONTRAST CLINICAL DATA:  74 year old male current smoker, with 116 pack-year history of smoking, for initial lung cancer screening  EXAM: CT CHEST WITHOUT CONTRAST LOW-DOSE FOR LUNG CANCER SCREENING  TECHNIQUE: Multidetector CT imaging of the chest was performed following the standard protocol without IV contrast.  COMPARISON:  None.  FINDINGS: Cardiovascular: The heart is normal in size. No pericardial effusion.  No evidence of thoracic aortic aneurysm. Atherosclerotic calcifications of the aortic arch.  Three vessel coronary atherosclerosis.  Mediastinum/Nodes: No suspicious mediastinal lymphadenopathy.  Visualized thyroid is unremarkable.  Lungs/Pleura: Mild paraseptal emphysematous changes, upper lobe predominant.  Mild subpleural reticulation/fibrosis in the bilateral lower lobes.  No focal consolidation.  No suspicious pulmonary nodules.  No  pleural effusion or pneumothorax.  Upper Abdomen: Visualized upper abdomen is notable for vascular calcifications, prior cholecystectomy, and a 3.4 cm posterior interpolar left renal cyst.  Musculoskeletal: Degenerative changes of the visualized thoracolumbar spine.  IMPRESSION: Lung-RADS 1, negative. Continue annual screening with low-dose chest CT without contrast in 12 months.  Aortic Atherosclerosis (ICD10-I70.0) and Emphysema (ICD10-J43.9).  Electronically Signed   By: Julian Hy M.D.   On: 02/18/2019 15:25  Assessment  The primary encounter diagnosis was Spinal stenosis of lumbar region, unspecified whether neurogenic claudication present. Diagnoses of Chronic hip pain,  left, Long term current use of opiate analgesic, History of CVA (cerebrovascular accident), History of lumbar spinal fusion, Right sided weakness, Acquired right foot drop, Chronic bilateral low back pain with bilateral sciatica, and Chronic pain syndrome were also pertinent to this visit.  Plan of Care  Logan Vazquez has a current medication list which includes the following long-term medication(s): albuterol, atorvastatin, fluoxetine, lisinopril, nitroglycerin, and pregabalin.  Pharmacotherapy (Medications Ordered): Meds ordered this encounter  Medications  . oxyCODONE-acetaminophen (PERCOCET) 10-325 MG tablet    Sig: Take 1 tablet by mouth every 8 (eight) hours as needed for pain. For chronic pain To last for 30 days from fill date    Dispense:  90 tablet    Refill:  0    Do not place this medication, or any other prescription from our practice, on "Automatic Refill". Patient may have prescription filled one day early if pharmacy is closed on scheduled refill date.  [name] oxyCODONE-acetaminophen (PERCOCET) 10-325 MG tablet    Sig: Take 1 tablet by mouth every 8 (eight) hours as needed for pain. For chronic pain To last for 30 days from fill date    Dispense:  90 tablet    Refill:  0    Do not  place this medication, or any other prescription from our practice, on "Automatic Refill". Patient may have prescription filled one day early if pharmacy is closed on scheduled refill date.  [name] oxyCODONE-acetaminophen (PERCOCET) 10-325 MG tablet    Sig: Take 1 tablet by mouth every 8 (eight) hours as needed for pain. For chronic pain To last for 30 days from fill date    Dispense:  90 tablet    Refill:  0    Do not place this medication, or any other prescription from our practice, on "Automatic Refill". Patient may have prescription filled one day early if pharmacy is closed on scheduled refill date.  . pregabalin (LYRICA) 75 MG capsule    Sig: Take 2 capsules (150 mg total) by mouth at bedtime as needed.    Dispense:  60 capsule    Refill:  2  . tiZANidine (ZANAFLEX) 4 MG tablet    Sig: Take 1 tablet (4 mg total) by mouth at bedtime as needed for muscle spasms.    Dispense:  30 tablet    Refill:  2    Do not place this medication, or any other prescription from our practice, on "Automatic Refill". Patient may have prescription filled one day early if pharmacy is closed on scheduled refill date.    Follow-up plan:   Return in about 3 months (around 05/02/2020) for Medication Management.    Recent Visits Date Type Provider Dept  11/23/19 Office Visit Gillis Santa, MD [facility]  Showing recent visits within past 90 days and meeting all other requirements   Today's Visits Date Type Provider Dept  02/03/20 Office Visit Gillis Santa, MD [facility]  Showing today's visits and meeting all other requirements   Future Appointments No visits were found meeting these conditions.  Showing future appointments within next 90 days and meeting all other requirements   I discussed the assessment and treatment plan with the patient. The patient was provided an opportunity to ask questions and all were answered. The patient agreed with the plan and demonstrated an understanding  of the instructions.  Patient advised to call back or seek an in-person evaluation if the symptoms or condition worsens.  Duration of encounter: 25 minutes.  Note  by: Gillis Santa, MD Date: 02/03/2020; Time: 10:02 AM

## 2020-02-11 ENCOUNTER — Telehealth: Payer: Self-pay | Admitting: *Deleted

## 2020-02-11 NOTE — Telephone Encounter (Signed)
(  02/11/2020) Left message for patient to notify them that it is time to schedule annual low dose lung cancer screening CT scan. Instructed patient to call back to verify information prior to the scan being scheduled °SRW °  ° ° °

## 2020-03-07 ENCOUNTER — Telehealth: Payer: Self-pay

## 2020-03-07 NOTE — Telephone Encounter (Signed)
Message left notifying patient that it is time to schedule the annual lung cancer screening low dose CT scan.  Instructed patient to return call to Shawn Perkins at [phone] to verify information prior to CT scan being scheduled.    

## 2020-03-18 ENCOUNTER — Telehealth: Payer: Self-pay

## 2020-03-18 NOTE — Telephone Encounter (Signed)
Patient notified that it is time to schedule the low dose lung cancer screening CT scan.  He is not interested in getting scheduled at this time and will return call to [facility] when he would like to have it scheduled.

## 2020-04-03 ENCOUNTER — Telehealth: Payer: Self-pay | Admitting: Student in an Organized Health Care Education/Training Program

## 2020-04-03 NOTE — Telephone Encounter (Signed)
Called patient and he states he has enough Percocet because he found #20 pills that he had put away.

## 2020-04-03 NOTE — Telephone Encounter (Addendum)
Patient lvmail on 4-15 at 9:29 states he should be able to fill meds on 19th not 20th, he left 2 other msgs same thing Pharmacy also called stating patient is out of meds on 15 and called to fill script. Stating Dr. Cherylann Ratel told him he could take more meds, but did not reflect this on the scripts sent in. They will need new script reflecting this change if Dr. Cherylann Ratel wants patient to have more meds.

## 2020-05-01 ENCOUNTER — Encounter: Payer: Self-pay | Admitting: Student in an Organized Health Care Education/Training Program

## 2020-05-02 ENCOUNTER — Encounter: Payer: Self-pay | Admitting: Student in an Organized Health Care Education/Training Program

## 2020-05-02 ENCOUNTER — Other Ambulatory Visit: Payer: Self-pay

## 2020-05-02 ENCOUNTER — Ambulatory Visit
Payer: Medicare HMO | Attending: Student in an Organized Health Care Education/Training Program | Admitting: Student in an Organized Health Care Education/Training Program

## 2020-05-02 DIAGNOSIS — Z8673 Personal history of transient ischemic attack (TIA), and cerebral infarction without residual deficits: Secondary | ICD-10-CM | POA: Diagnosis not present

## 2020-05-02 DIAGNOSIS — M25552 Pain in left hip: Secondary | ICD-10-CM | POA: Diagnosis not present

## 2020-05-02 DIAGNOSIS — Z79891 Long term (current) use of opiate analgesic: Secondary | ICD-10-CM | POA: Diagnosis not present

## 2020-05-02 DIAGNOSIS — M5136 Other intervertebral disc degeneration, lumbar region: Secondary | ICD-10-CM

## 2020-05-02 DIAGNOSIS — G894 Chronic pain syndrome: Secondary | ICD-10-CM

## 2020-05-02 DIAGNOSIS — Z981 Arthrodesis status: Secondary | ICD-10-CM

## 2020-05-02 DIAGNOSIS — M21371 Foot drop, right foot: Secondary | ICD-10-CM

## 2020-05-02 DIAGNOSIS — M48061 Spinal stenosis, lumbar region without neurogenic claudication: Secondary | ICD-10-CM | POA: Diagnosis not present

## 2020-05-02 DIAGNOSIS — Z79899 Other long term (current) drug therapy: Secondary | ICD-10-CM

## 2020-05-02 DIAGNOSIS — G8929 Other chronic pain: Secondary | ICD-10-CM

## 2020-05-02 MED ORDER — OXYCODONE-ACETAMINOPHEN 10-325 MG PO TABS: 1.0000 | ORAL_TABLET | Freq: Three times a day (TID) | ORAL | 0 refills | Status: DC | PRN

## 2020-05-02 MED ORDER — TIZANIDINE HCL 4 MG PO TABS: 4.0000 mg | ORAL_TABLET | Freq: Every evening | ORAL | 2 refills | Status: AC | PRN

## 2020-05-02 MED ORDER — PREGABALIN 75 MG PO CAPS: 150.0000 mg | ORAL_CAPSULE | Freq: Every evening | ORAL | 2 refills | Status: DC | PRN

## 2020-05-02 NOTE — Progress Notes (Signed)
Patient: Logan Vazquez  Service Category: E/M  Provider: Gillis Santa, MD  DOB: January 18, 1946  DOS: 05/02/2020  Location: Office  MRN: [id number]  Setting: Ambulatory outpatient  Referring Provider: Letta Median, MD  Type: Established Patient  Specialty: Interventional Pain Management  PCP: Letta Median, MD  Location: Home  Delivery: TeleHealth     Virtual Encounter - Pain Management PROVIDER NOTE: Information contained herein reflects review and annotations entered in association with encounter. Interpretation of such information and data should be left to medically-trained personnel. Information provided to patient can be located elsewhere in the medical record under "Patient Instructions". Document created using STT-dictation technology, any transcriptional errors that may result from process are unintentional.    Contact & Pharmacy Preferred: [phone] Home: [phone] (home) Mobile: There is no such number on file (mobile). E-mail: No e-mail address on record  [place], [place] [address] [place] [address] Phone: [phone] Fax: [facility] [place], [place] [address] Phone: [phone] Fax: [phone]   Pre-screening  Logan Vazquez offered "in-person" vs "virtual" encounter. He indicated preferring virtual for this encounter.   Reason COVID-19*  Social distancing based on CDC and AMA recommendations.   I contacted Logan Vazquez on 05/02/2020 via video conference.      I clearly identified myself as Gillis Santa, MD. I verified that I was speaking with the correct person using two identifiers (Name: BRODI KARI, and date of birth: October 30, 1946).  Consent I sought verbal advanced consent from Logan Vazquez for virtual visit interactions. I informed Logan Vazquez of possible security and privacy concerns, risks, and limitations associated with  providing "not-in-person" medical evaluation and management services. I also informed Logan Vazquez of the availability of "in-person" appointments. Finally, I informed him that there would be a charge for the virtual visit and that he could be  personally, fully or partially, financially responsible for it. Logan Vazquez expressed understanding and agreed to proceed.   Historic Elements   Logan Vazquez is a 74 y.o. year old, male patient evaluated today after his last contact with our practice on 04/03/2020. Logan Vazquez  has a past medical history of Arthritis, Asthma, CAD (coronary artery disease), Depression, Emphysema lung ([place]), Glaucoma, and Hypertension. He also  has a past surgical history that includes heart surgery (07/1999); Hip surgery (Left, 1968); Back surgery (1985); Skin graft (1968); and Humerus fracture surgery (1968). Logan Vazquez has a current medication list which includes the following prescription(s): albuterol, aspirin ec, atorvastatin, buspirone, cholecalciferol, b-12, docusate sodium, fluoxetine, hydrocortisone, lisinopril, mupirocin cream, nitroglycerin, [START ON 05/04/2020] oxycodone-acetaminophen, [START ON 06/03/2020] oxycodone-acetaminophen, pregabalin, and tizanidine. He  reports that he has been smoking cigarettes. He has a 116.00 pack-year smoking history. He has never used smokeless tobacco. He reports that he does not drink alcohol or use drugs. Logan Vazquez is allergic to ibuprofen.   HPI  Today, he is being contacted for medication management.   States that he has been feeling ill: started Saturday- cough and cold sx. Denies fever. Plans on following up with PCP.  Patient's pain is at baseline.  Patient continues multimodal pain regimen as prescribed.  States that it provides pain relief and improvement in functional status.   Pharmacotherapy Assessment  Analgesic: 04/05/2020  1   02/03/2020  Oxycodone-Acetaminophen 10-325  90.00  30 Bi Lat  [id number]   [facility] (1669)   0   45.00 MME  Medicare   [place]  03/31/2020  1   02/03/2020  Pregabalin 75 MG Capsule  60.00  30 Bi Lat   [id number]   [place] (1669)   1  1.00 LME  Medicare   [place]  03/06/2020  1   02/03/2020  Oxycodone-Acetaminophen 10-325  90.00  30 Bi Lat   [id number]   [place] (1669)   0  45.00 MME  Medicare   [place]  03/03/2020  1   02/03/2020  Pregabalin 75 MG Capsule  60.00  30 Bi Lat   [id number]   [place] (1669)   0  1.00 LME  Medicare   [place]  02/05/2020  1   02/03/2020  Oxycodone-Acetaminophen 10-325  90.00  30 Bi Lat   [id number]   [place] (1669)   0  45.00 MME  Medicare   [place]    Monitoring: [place] PMP: PDMP reviewed during this encounter.       Pharmacotherapy: No side-effects or adverse reactions reported. Compliance: No problems identified. Effectiveness: Clinically acceptable. Plan: Refer to "POC".  UDS:  Summary  Date Value Ref Range Status  01/05/2019 FINAL  Final    Comment:    ==================================================================== TOXASSURE SELECT 13 (MW) ==================================================================== Test                             Result       Flag       Units Drug Present and Declared for Prescription Verification   Oxycodone                      1254         EXPECTED   ng/mg creat   Oxymorphone                    310          EXPECTED   ng/mg creat   Noroxycodone                   4592         EXPECTED   ng/mg creat   Noroxymorphone                 247          EXPECTED   ng/mg creat    Sources of oxycodone are scheduled prescription medications.    Oxymorphone, noroxycodone, and noroxymorphone are expected    metabolites of oxycodone. Oxymorphone is also available as a    scheduled prescription medication. Drug Present not Declared for Prescription Verification   Alprazolam                     15           UNEXPECTED ng/mg creat   Alpha-hydroxyalprazolam        19           UNEXPECTED ng/mg creat    Source of alprazolam is a scheduled prescription medication.     Alpha-hydroxyalprazolam is an expected metabolite of alprazolam. ==================================================================== Test                      Result    Flag   Units      Ref Range   Creatinine              150  mg/dL      >=20 ==================================================================== Declared Medications:  The flagging and interpretation on this report are based on the  following declared medications.  Unexpected results may arise from  inaccuracies in the declared medications.  **Note: The testing scope of this panel includes these medications:  Oxycodone (Oxycodone Acetaminophen)  **Note: The testing scope of this panel does not include following  reported medications:  Acetaminophen (Oxycodone Acetaminophen)  Albuterol  Aspirin (Aspirin 81)  Atorvastatin  Baclofen  Celecoxib  Cholecalciferol  Cyanocobalamin  Docusate  Fluoxetine  Hydrocortisone  Lisinopril  Mupirocin  Nitroglycerin  Pregabalin ==================================================================== For clinical consultation, please call [phone]. ====================================================================    Laboratory Chemistry Profile   Renal Lab Results  Component Value Date   BUN 15 10/23/2017   CREATININE 0.92 70/35/0093   BCR NOT APPLICABLE 81/82/9937     Hepatic Lab Results  Component Value Date   AST 14 10/23/2017   ALT 10 10/23/2017     Electrolytes Lab Results  Component Value Date   NA 138 10/23/2017   K 4.9 10/23/2017   CL 102 10/23/2017   CALCIUM 9.5 10/23/2017     Bone No results found for: VD25OH, VD125OH2TOT, [id number], BO1751WC5, 25OHVITD1, 25OHVITD2, 25OHVITD3, TESTOFREE, TESTOSTERONE   Inflammation (CRP: Acute Phase) (ESR: Chronic Phase) No results found for: CRP, ESRSEDRATE, LATICACIDVEN     Note: Above Lab results reviewed.  Imaging  DG HIP UNILAT W OR W/O PELVIS 2-3 VIEWS LEFT CLINICAL DATA:  Chronic left  hip pain. Previous injury and surgery to the left hip in the 1960s.  EXAM: DG HIP (WITH OR WITHOUT PELVIS) 2-3V LEFT  COMPARISON:  None.  FINDINGS: Diffuse osteopenia. Old, healed proximal left femur fracture with a probable involuting intramedullary rod tract. Associated corticated bony fragmentation at the superior aspect of the greater trochanter. Lumbosacral spine hardware and bone fusion.  IMPRESSION: No acute abnormality. Old posttraumatic and postsurgical changes, as described above.  Electronically Signed   By: Claudie Revering M.D.   On: 02/18/2019 20:36 CT CHEST LUNG CANCER SCREENING LOW DOSE WO CONTRAST CLINICAL DATA:  74 year old male current smoker, with 116 pack-year history of smoking, for initial lung cancer screening  EXAM: CT CHEST WITHOUT CONTRAST LOW-DOSE FOR LUNG CANCER SCREENING  TECHNIQUE: Multidetector CT imaging of the chest was performed following the standard protocol without IV contrast.  COMPARISON:  None.  FINDINGS: Cardiovascular: The heart is normal in size. No pericardial effusion.  No evidence of thoracic aortic aneurysm. Atherosclerotic calcifications of the aortic arch.  Three vessel coronary atherosclerosis.  Mediastinum/Nodes: No suspicious mediastinal lymphadenopathy.  Visualized thyroid is unremarkable.  Lungs/Pleura: Mild paraseptal emphysematous changes, upper lobe predominant.  Mild subpleural reticulation/fibrosis in the bilateral lower lobes.  No focal consolidation.  No suspicious pulmonary nodules.  No pleural effusion or pneumothorax.  Upper Abdomen: Visualized upper abdomen is notable for vascular calcifications, prior cholecystectomy, and a 3.4 cm posterior interpolar left renal cyst.  Musculoskeletal: Degenerative changes of the visualized thoracolumbar spine.  IMPRESSION: Lung-RADS 1, negative. Continue annual screening with low-dose chest CT without contrast in 12 months.  Aortic Atherosclerosis  (ICD10-I70.0) and Emphysema (ICD10-J43.9).  Electronically Signed   By: Julian Hy M.D.   On: 02/18/2019 15:25  Assessment  The primary encounter diagnosis was Spinal stenosis of lumbar region, unspecified whether neurogenic claudication present. Diagnoses of Chronic hip pain, left, Long term current use of opiate analgesic, History of CVA (cerebrovascular accident), History of lumbar spinal fusion, Acquired right foot drop, Lumbar degenerative disc disease, Controlled  substance agreement signed, and Chronic pain syndrome were also pertinent to this visit.  Plan of Care  Logan Vazquez has a current medication list which includes the following long-term medication(s): albuterol, atorvastatin, fluoxetine, lisinopril, nitroglycerin, and pregabalin.  Pharmacotherapy (Medications Ordered): Meds ordered this encounter  Medications  . oxyCODONE-acetaminophen (PERCOCET) 10-325 MG tablet    Sig: Take 1 tablet by mouth every 8 (eight) hours as needed for pain. For chronic pain To last for 30 days from fill date    Dispense:  90 tablet    Refill:  0    Do not place this medication, or any other prescription from our practice, on "Automatic Refill". Patient may have prescription filled one day early if pharmacy is closed on scheduled refill date.  [name] oxyCODONE-acetaminophen (PERCOCET) 10-325 MG tablet    Sig: Take 1 tablet by mouth every 8 (eight) hours as needed for pain. For chronic pain To last for 30 days from fill date    Dispense:  90 tablet    Refill:  0    Do not place this medication, or any other prescription from our practice, on "Automatic Refill". Patient may have prescription filled one day early if pharmacy is closed on scheduled refill date.  [name] tiZANidine (ZANAFLEX) 4 MG tablet    Sig: Take 1 tablet (4 mg total) by mouth at bedtime as needed for muscle spasms.    Dispense:  30 tablet    Refill:  2    Do not place this medication, or any other prescription from our  practice, on "Automatic Refill". Patient may have prescription filled one day early if pharmacy is closed on scheduled refill date.  . pregabalin (LYRICA) 75 MG capsule    Sig: Take 2 capsules (150 mg total) by mouth at bedtime as needed.    Dispense:  60 capsule    Refill:  2   Follow-up plan:   Return in about 8 weeks (around 06/27/2020) for Medication Management, in person.    Recent Visits Date Type Provider Dept  02/03/20 Office Visit Gillis Santa, MD [facility]  Showing recent visits within past 90 days and meeting all other requirements   Today's Visits Date Type Provider Dept  05/02/20 Telemedicine Gillis Santa, MD [facility]  Showing today's visits and meeting all other requirements   Future Appointments No visits were found meeting these conditions.  Showing future appointments within next 90 days and meeting all other requirements   I discussed the assessment and treatment plan with the patient. The patient was provided an opportunity to ask questions and all were answered. The patient agreed with the plan and demonstrated an understanding of the instructions.  Patient advised to call back or seek an in-person evaluation if the symptoms or condition worsens.  Duration of encounter: 32mnutes.  Note by: BGillis Santa MD Date: 05/02/2020; Time: 9:23 AM

## 2020-05-13 IMAGING — CT CT CHEST LUNG CANCER SCREENING LOW DOSE W/O CM
2 of 5 series · 15 of 40 positions shown, 18 images · non-contrast
Comparison: None.

CLINICAL DATA: 72-year-old male current smoker, with 116 pack-year
history of smoking, for initial lung cancer screening

EXAM:
CT CHEST WITHOUT CONTRAST LOW-DOSE FOR LUNG CANCER SCREENING
TECHNIQUE: Multidetector CT imaging of the chest was performed following the
standard protocol without IV contrast.

[Series 3: lung · axial · 0.64mm/px · z∈[-1257,-950]mm · 12 of 339 slices shown, 15 images (1 of 2)]
[im 16/339  mediastinal]
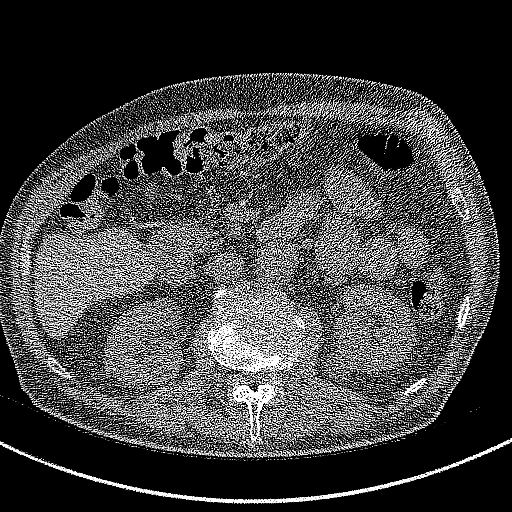
[im 16/339  lung]
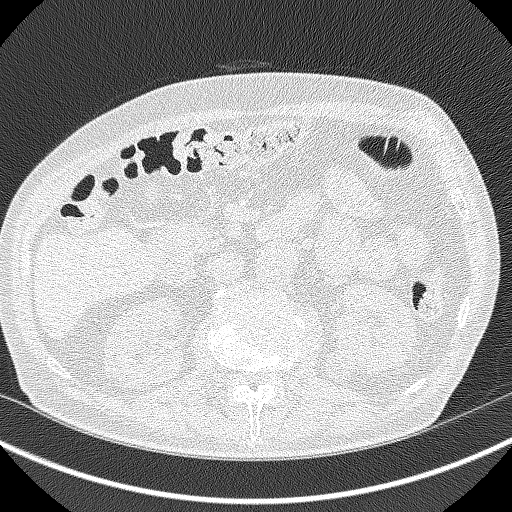
[im 47/339  lung]
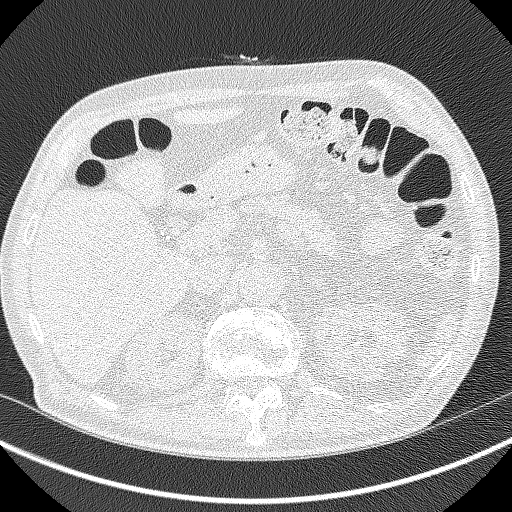
[im 77/339  lung]
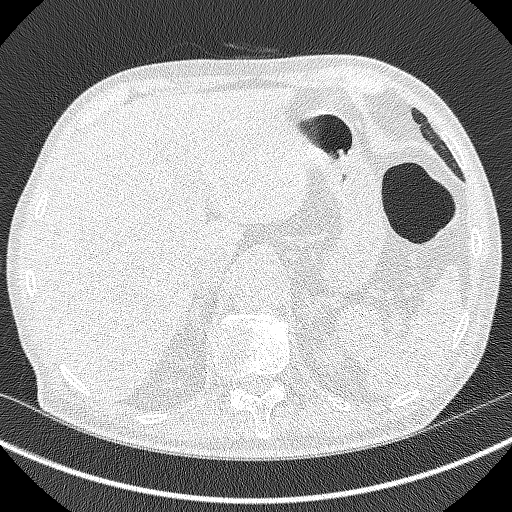
[im 108/339  lung]
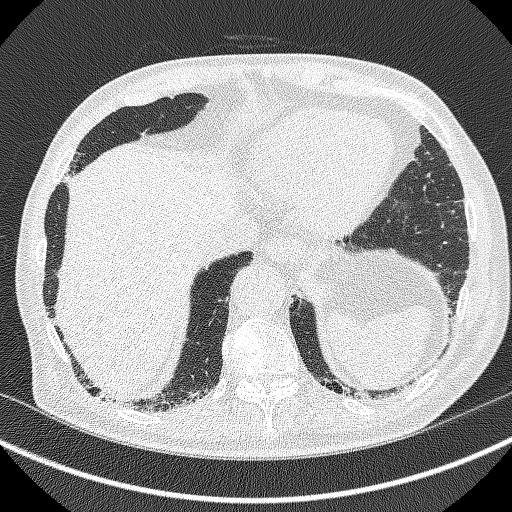
[im 123/339  mediastinal]
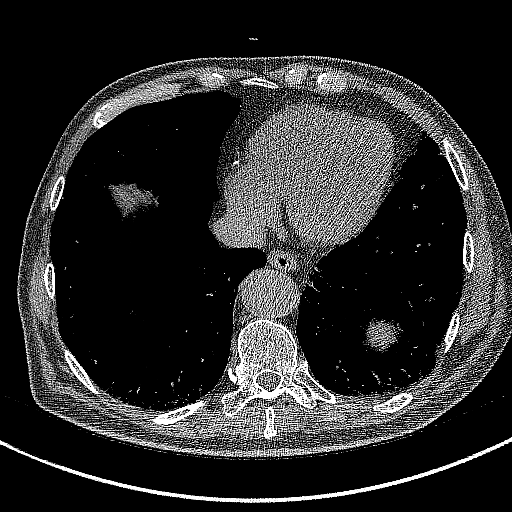
[im 123/339  lung]
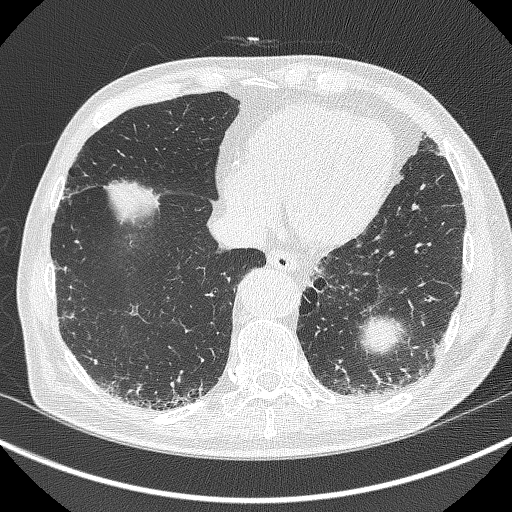
[im 154/339  lung]
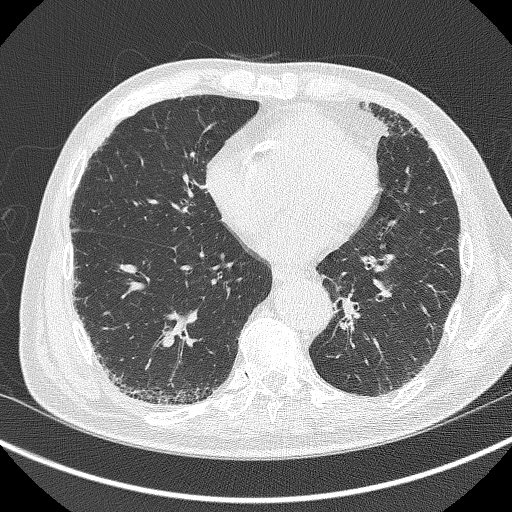
[im 185/339  lung]
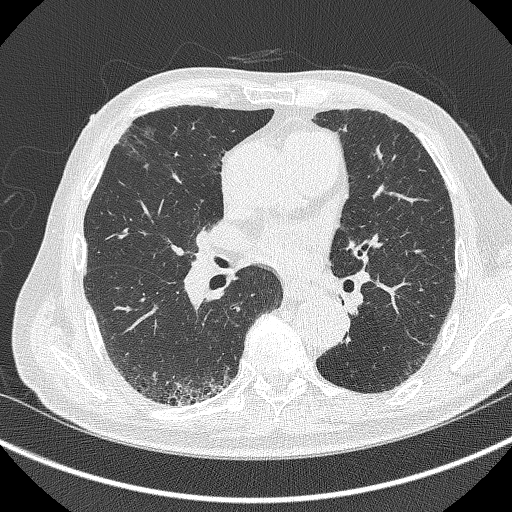
[im 216/339  lung]
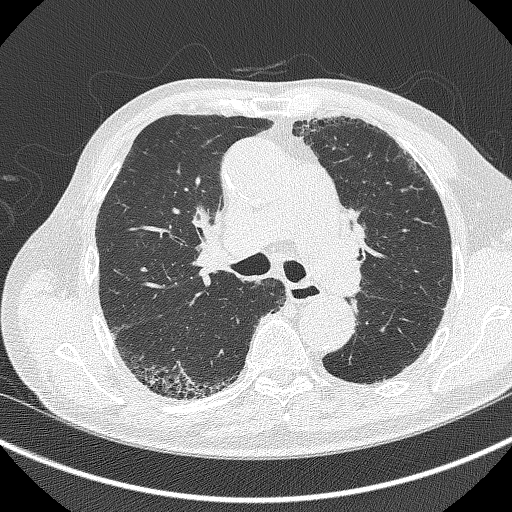
[im 231/339  mediastinal]
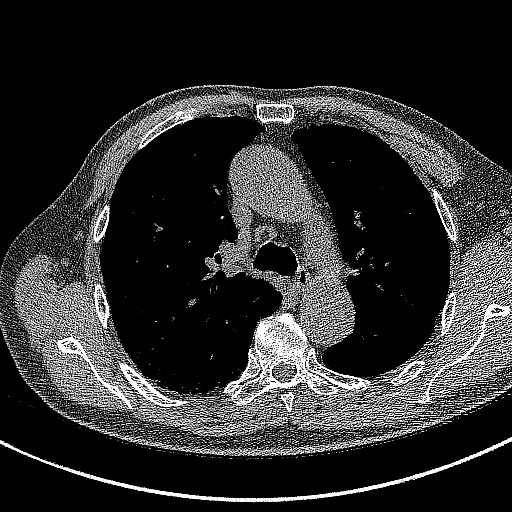
[im 231/339  lung]
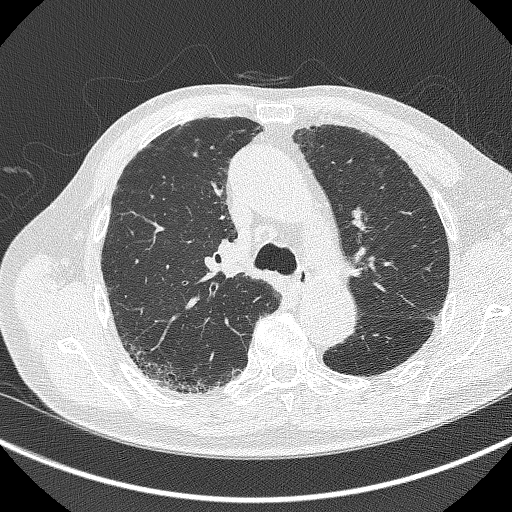
[im 262/339  lung]
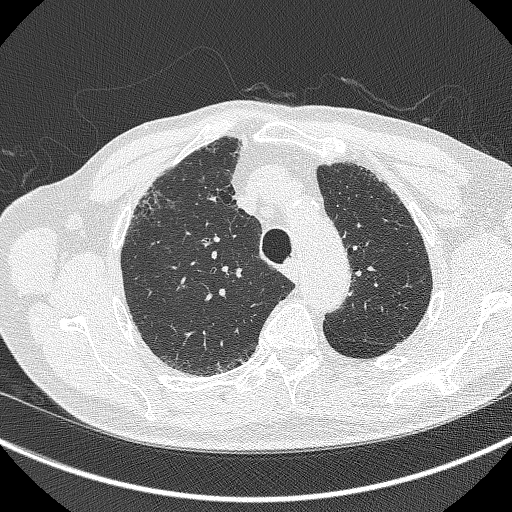
[im 292/339  lung]
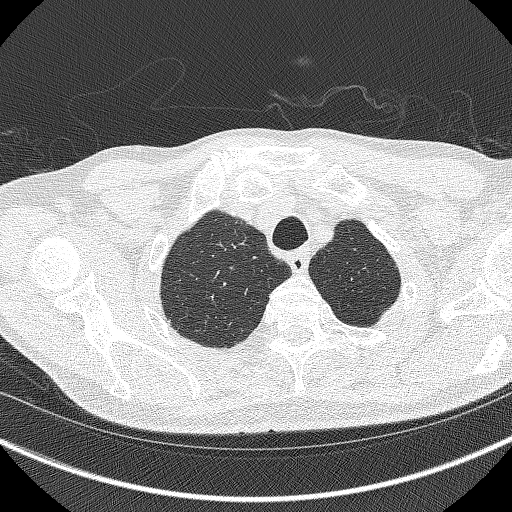
[im 323/339  lung]
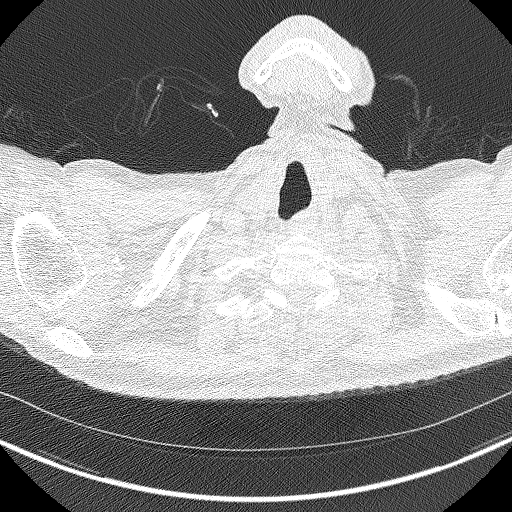

[Series 4: lung · coronal · 0.64mm/px · 3 of 263 slices shown (2 of 2)]
[im 53/263  lung]
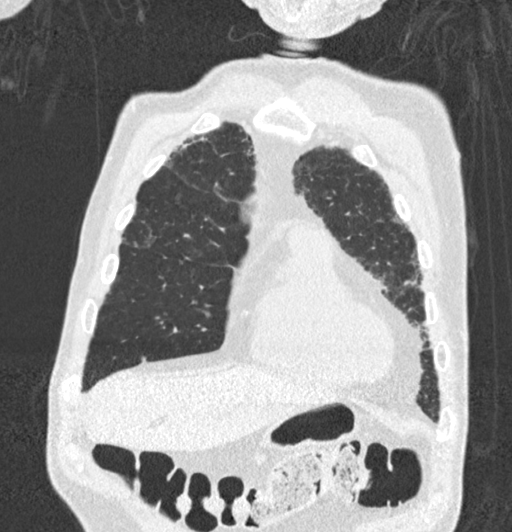
[im 105/263  lung]
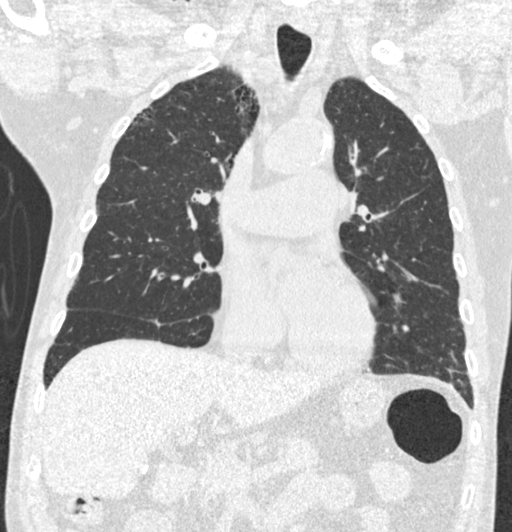
[im 158/263  lung]
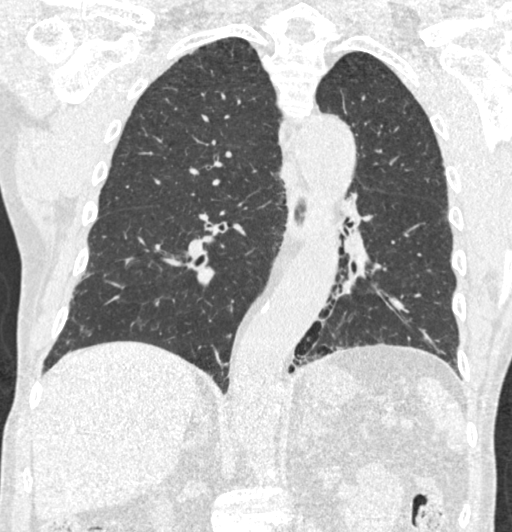

[15 of 40 positions shown; findings below may reference images not displayed]

FINDINGS: Cardiovascular: The heart is normal in size. No pericardial
effusion.

No evidence of thoracic aortic aneurysm. Atherosclerotic
calcifications of the aortic arch.

Three vessel coronary atherosclerosis.

Mediastinum/Nodes: No suspicious mediastinal lymphadenopathy.

Visualized thyroid is unremarkable.

Lungs/Pleura: Mild paraseptal emphysematous changes, upper lobe
predominant.

Mild subpleural reticulation/fibrosis in the bilateral lower lobes.

No focal consolidation.

No suspicious pulmonary nodules.

No pleural effusion or pneumothorax.

Upper Abdomen: Visualized upper abdomen is notable for vascular
calcifications, prior cholecystectomy, and a 3.4 cm posterior
interpolar left renal cyst.

Musculoskeletal: Degenerative changes of the visualized
thoracolumbar spine.
IMPRESSION: Lung-RADS 1, negative. Continue annual screening with low-dose chest
CT without contrast in 12 months.

Aortic Atherosclerosis ([id number].B) and Emphysema ([id number].E).

## 2020-06-21 ENCOUNTER — Telehealth: Payer: Self-pay

## 2020-06-21 NOTE — Telephone Encounter (Signed)
Message left notifying patient that it is time to schedule the low dose lung cancer screening CT scan.  Instructed patient to return call to Shawn Perkins at [phone] to verify information prior to CT scan being scheduled.    

## 2020-06-27 ENCOUNTER — Encounter: Payer: Self-pay | Admitting: Student in an Organized Health Care Education/Training Program

## 2020-06-27 ENCOUNTER — Other Ambulatory Visit: Payer: Self-pay

## 2020-06-27 ENCOUNTER — Ambulatory Visit
Payer: Medicare HMO | Attending: Student in an Organized Health Care Education/Training Program | Admitting: Student in an Organized Health Care Education/Training Program

## 2020-06-27 VITALS — BP 129/80 | HR 84 | Temp 98.4°F | Resp 18 | Ht 69.0 in | Wt 129.0 lb

## 2020-06-27 DIAGNOSIS — Z79891 Long term (current) use of opiate analgesic: Secondary | ICD-10-CM | POA: Insufficient documentation

## 2020-06-27 DIAGNOSIS — G894 Chronic pain syndrome: Secondary | ICD-10-CM | POA: Insufficient documentation

## 2020-06-27 DIAGNOSIS — M21371 Foot drop, right foot: Secondary | ICD-10-CM | POA: Insufficient documentation

## 2020-06-27 DIAGNOSIS — M25552 Pain in left hip: Secondary | ICD-10-CM | POA: Diagnosis not present

## 2020-06-27 DIAGNOSIS — Z0289 Encounter for other administrative examinations: Secondary | ICD-10-CM

## 2020-06-27 DIAGNOSIS — G8929 Other chronic pain: Secondary | ICD-10-CM | POA: Diagnosis present

## 2020-06-27 DIAGNOSIS — Z981 Arthrodesis status: Secondary | ICD-10-CM | POA: Insufficient documentation

## 2020-06-27 DIAGNOSIS — Z79899 Other long term (current) drug therapy: Secondary | ICD-10-CM | POA: Diagnosis present

## 2020-06-27 DIAGNOSIS — M5136 Other intervertebral disc degeneration, lumbar region: Secondary | ICD-10-CM | POA: Insufficient documentation

## 2020-06-27 DIAGNOSIS — M48061 Spinal stenosis, lumbar region without neurogenic claudication: Secondary | ICD-10-CM | POA: Insufficient documentation

## 2020-06-27 DIAGNOSIS — Z8673 Personal history of transient ischemic attack (TIA), and cerebral infarction without residual deficits: Secondary | ICD-10-CM | POA: Diagnosis present

## 2020-06-27 MED ORDER — OXYCODONE-ACETAMINOPHEN 10-325 MG PO TABS: 1.0000 | ORAL_TABLET | Freq: Three times a day (TID) | ORAL | 0 refills | Status: DC | PRN

## 2020-06-27 NOTE — Progress Notes (Signed)
PROVIDER NOTE: Information contained herein reflects review and annotations entered in association with encounter. Interpretation of such information and data should be left to medically-trained personnel. Information provided to patient can be located elsewhere in the medical record under "Patient Instructions". Document created using STT-dictation technology, any transcriptional errors that may result from process are unintentional.    Patient: Logan Vazquez  Service Category: E/M  Provider: Gillis Santa, MD  DOB: [birth year]  DOS: 06/27/2020  Specialty: Interventional Pain Management  MRN: [id number]  Setting: Ambulatory outpatient  PCP: Letta Median, MD  Type: Established Patient    Referring Provider: Letta Median, MD  Location: Office  Delivery: Face-to-face     HPI  Reason for encounter: Logan Vazquez, a 74 y.o. year old male, is here today for evaluation and management of his Spinal stenosis of lumbar region, unspecified whether neurogenic claudication present [M48.061]. [name] primary complain today is Medication Refill Last encounter: Practice (04/03/2020). My last encounter with him was on 04/03/2020. Pertinent problems: [name] has Anxiety disorder; Atherosclerotic heart disease of native coronary artery without angina pectoris; History of lumbar spinal fusion; Lumbar degenerative disc disease; Recurrent major depressive disorder, in partial remission ([place]); Chronic pain syndrome; Long term current use of opiate analgesic; Chronic bilateral low back pain with bilateral sciatica; Chronic hip pain, left; and Spinal stenosis of lumbar region on their pertinent problem list. Pain Assessment: Severity of Chronic pain is reported as a 9 /10. Location: Back Lower/from lower back intto entire legs bilateral. Onset: More than a month ago. Quality: Constant. Timing: Constant. Modifying factor(s): Pian medicine. Vitals:  height is 5' 9"  (1.753 m) and weight is 129 lb (58.5 kg).  His temporal temperature is 98.4 F (36.9 C). His blood pressure is 129/80 and his pulse is 84. His respiration is 18 and oxygen saturation is 96%.   No change in medical history since last visit other than viral gastritis that caused diarrhea over 2 weeks resulting in weight loss. GI sx have improved and patient is tolerating PO intake. Patient's pain is at baseline.  Patient continues multimodal pain regimen as prescribed.  States that it provides pain relief and improvement in functional status.   Pharmacotherapy Assessment   Analgesic: 06/03/2020  1   05/02/2020  Oxycodone-Acetaminophen 10-325  90.00  30 Bi Lat   [id number]   [place] ([id number])   0/0  45.00 MME  Medicare   [facility]     Monitoring: [place] PMP: PDMP reviewed during this encounter.       Pharmacotherapy: No side-effects or adverse reactions reported. Compliance: No problems identified. Effectiveness: Clinically acceptable.  [name], RN  06/27/2020  2:14 PM  Sign when Signing Visit Nursing Pain Medication Assessment:  Safety precautions to be maintained throughout the outpatient stay will include: orient to surroundings, keep bed in low position, maintain call bell within reach at all times, provide assistance with transfer out of bed and ambulation.  Medication Inspection Compliance: [name] did not comply with our request to bring his pills to be counted. He was reminded that bringing the medication bottles, even when empty, is a requirement.  Medication: None brought in. Pill/Patch Count: None available to be counted. Bottle Appearance: No container available. Did not bring bottle(s) to appointment. Filled Date: N/A Last Medication intake:  Patient states he has 19 pills left on the bottle     UDS:  Summary  Date Value Ref Range Status  01/05/2019 FINAL  Final  Comment:    ==================================================================== TOXASSURE SELECT 13  (MW) ==================================================================== Test                             Result       Flag       Units Drug Present and Declared for Prescription Verification   Oxycodone                      1254         EXPECTED   ng/mg creat   Oxymorphone                    310          EXPECTED   ng/mg creat   Noroxycodone                   4592         EXPECTED   ng/mg creat   Noroxymorphone                 247          EXPECTED   ng/mg creat    Sources of oxycodone are scheduled prescription medications.    Oxymorphone, noroxycodone, and noroxymorphone are expected    metabolites of oxycodone. Oxymorphone is also available as a    scheduled prescription medication. Drug Present not Declared for Prescription Verification   Alprazolam                     15           UNEXPECTED ng/mg creat   Alpha-hydroxyalprazolam        19           UNEXPECTED ng/mg creat    Source of alprazolam is a scheduled prescription medication.    Alpha-hydroxyalprazolam is an expected metabolite of alprazolam. ==================================================================== Test                      Result    Flag   Units      Ref Range   Creatinine              150              mg/dL      >=20 ==================================================================== Declared Medications:  The flagging and interpretation on this report are based on the  following declared medications.  Unexpected results may arise from  inaccuracies in the declared medications.  **Note: The testing scope of this panel includes these medications:  Oxycodone (Oxycodone Acetaminophen)  **Note: The testing scope of this panel does not include following  reported medications:  Acetaminophen (Oxycodone Acetaminophen)  Albuterol  Aspirin (Aspirin 81)  Atorvastatin  Baclofen  Celecoxib  Cholecalciferol  Cyanocobalamin  Docusate  Fluoxetine  Hydrocortisone  Lisinopril  Mupirocin  Nitroglycerin   Pregabalin ==================================================================== For clinical consultation, please call [phone]. ====================================================================      ROS  Constitutional: Denies any fever or chills Gastrointestinal: No reported hemesis, hematochezia, vomiting, or acute GI distress Musculoskeletal: Denies any acute onset joint swelling, redness, loss of ROM, or weakness Neurological: No reported episodes of acute onset apraxia, aphasia, dysarthria, agnosia, amnesia, paralysis, loss of coordination, or loss of consciousness  Medication Review  B-12, Cholecalciferol, FLUoxetine, albuterol, aspirin EC, atorvastatin, busPIRone, docusate sodium, hydrocortisone, lisinopril, mupirocin cream, nitroGLYCERIN, oxyCODONE-acetaminophen, pregabalin, and tiZANidine  History Review  Allergy: Mr. Capozzoli is allergic  to ibuprofen. Drug: Mr. Geesey  reports no history of drug use. Alcohol:  reports no history of alcohol use. Tobacco:  reports that he has been smoking cigarettes. He has a 116.00 pack-year smoking history. He has never used smokeless tobacco. Social: Mr. Dollar  reports that he has been smoking cigarettes. He has a 116.00 pack-year smoking history. He has never used smokeless tobacco. He reports that he does not drink alcohol and does not use drugs. Medical:  has a past medical history of Arthritis, Asthma, CAD (coronary artery disease), Depression, Emphysema lung ([place]), Glaucoma, and Hypertension. Surgical: Mr. Rowser  has a past surgical history that includes heart surgery (07/1999); Hip surgery (Left, 1968); Back surgery (1985); Skin graft (1968); and Humerus fracture surgery (1968). Family: family history includes Cancer in his father; Hearing loss in his mother; Heart attack in his brother; Heart disease in his brother; Hypertension in his father and mother; Stroke in his sister.  Laboratory Chemistry Profile   Renal Lab Results   Component Value Date   BUN 15 10/23/2017   CREATININE 0.92 48/18/5631   BCR NOT APPLICABLE 49/70/2637     Hepatic Lab Results  Component Value Date   AST 14 10/23/2017   ALT 10 10/23/2017     Electrolytes Lab Results  Component Value Date   NA 138 10/23/2017   K 4.9 10/23/2017   CL 102 10/23/2017   CALCIUM 9.5 10/23/2017     Bone No results found for: VD25OH, VD125OH2TOT, CH8850YD7, AJ2878MV6, 25OHVITD1, 25OHVITD2, 25OHVITD3, TESTOFREE, TESTOSTERONE   Inflammation (CRP: Acute Phase) (ESR: Chronic Phase) No results found for: CRP, ESRSEDRATE, LATICACIDVEN     Note: Above Lab results reviewed.  Recent Imaging Review  DG HIP UNILAT W OR W/O PELVIS 2-3 VIEWS LEFT CLINICAL DATA:  Chronic left hip pain. Previous injury and surgery to the left hip in the 1960s.  EXAM: DG HIP (WITH OR WITHOUT PELVIS) 2-3V LEFT  COMPARISON:  None.  FINDINGS: Diffuse osteopenia. Old, healed proximal left femur fracture with a probable involuting intramedullary rod tract. Associated corticated bony fragmentation at the superior aspect of the greater trochanter. Lumbosacral spine hardware and bone fusion.  IMPRESSION: No acute abnormality. Old posttraumatic and postsurgical changes, as described above.  Electronically Signed   By: Claudie Revering M.D.   On: 02/18/2019 20:36 CT CHEST LUNG CANCER SCREENING LOW DOSE WO CONTRAST CLINICAL DATA:  74 year old male current smoker, with 116 pack-year history of smoking, for initial lung cancer screening  EXAM: CT CHEST WITHOUT CONTRAST LOW-DOSE FOR LUNG CANCER SCREENING  TECHNIQUE: Multidetector CT imaging of the chest was performed following the standard protocol without IV contrast.  COMPARISON:  None.  FINDINGS: Cardiovascular: The heart is normal in size. No pericardial effusion.  No evidence of thoracic aortic aneurysm. Atherosclerotic calcifications of the aortic arch.  Three vessel coronary  atherosclerosis.  Mediastinum/Nodes: No suspicious mediastinal lymphadenopathy.  Visualized thyroid is unremarkable.  Lungs/Pleura: Mild paraseptal emphysematous changes, upper lobe predominant.  Mild subpleural reticulation/fibrosis in the bilateral lower lobes.  No focal consolidation.  No suspicious pulmonary nodules.  No pleural effusion or pneumothorax.  Upper Abdomen: Visualized upper abdomen is notable for vascular calcifications, prior cholecystectomy, and a 3.4 cm posterior interpolar left renal cyst.  Musculoskeletal: Degenerative changes of the visualized thoracolumbar spine.  IMPRESSION: Lung-RADS 1, negative. Continue annual screening with low-dose chest CT without contrast in 12 months.  Aortic Atherosclerosis (ICD10-I70.0) and Emphysema (ICD10-J43.9).  Electronically Signed   By: Julian Hy M.D.   On: 02/18/2019  15:25 Note: Reviewed        Physical Exam  General appearance: Well nourished, well developed, and well hydrated. In no apparent acute distress Mental status: Alert, oriented x 3 (person, place, & time)       Respiratory: No evidence of acute respiratory distress Eyes: PERLA Vitals: BP 129/80   Pulse 84   Temp 98.4 F (36.9 C) (Temporal)   Resp 18   Ht 5' 9"  (1.753 m)   Wt 129 lb (58.5 kg)   SpO2 96%   BMI 19.05 kg/m  BMI: Estimated body mass index is 19.05 kg/m as calculated from the following:   Height as of this encounter: 5' 9"  (1.753 m).   Weight as of this encounter: 129 lb (58.5 kg). Ideal: Ideal body weight: 70.7 kg (155 lb 13.8 oz)  Lumbar Spine Area Exam  Skin & Axial Inspection: No masses, redness, or swelling Alignment: Symmetrical Functional ROM: Pain restricted ROM affecting both sides Stability: No instability detected Muscle Tone/Strength: Functionally intact. No obvious neuro-muscular anomalies detected. Sensory (Neurological): Dermatomal pain pattern and musculoskeletal  Gait & Posture Assessment   Ambulation: Limited Gait: Significantly limited. Dependent on assistive device to ambulate Posture: Difficulty standing up straight, due to pain  Lower Extremity Exam    Side: Right lower extremity  Side: Left lower extremity  Stability: No instability observed          Stability: No instability observed          Skin & Extremity Inspection: Skin color, temperature, and hair growth are WNL. No peripheral edema or cyanosis. No masses, redness, swelling, asymmetry, or associated skin lesions. No contractures.  Skin & Extremity Inspection: Skin color, temperature, and hair growth are WNL. No peripheral edema or cyanosis. No masses, redness, swelling, asymmetry, or associated skin lesions. No contractures.  Functional ROM: Decreased ROM for hip and knee joints          Functional ROM: Decreased ROM for hip and knee joints          Muscle Tone/Strength: Functionally intact. No obvious neuro-muscular anomalies detected.  Muscle Tone/Strength: Functionally intact. No obvious neuro-muscular anomalies detected.  Sensory (Neurological): Musculoskeletal pain pattern        Sensory (Neurological): Musculoskeletal pain pattern        DTR: Patellar: deferred today Achilles: deferred today Plantar: deferred today  DTR: Patellar: deferred today Achilles: deferred today Plantar: deferred today  Palpation: No palpable anomalies  Palpation: No palpable anomalies    Assessment   Status Diagnosis  Controlled Controlled Controlled 1. Spinal stenosis of lumbar region, unspecified whether neurogenic claudication present   2. Pain medication agreement   3. Chronic pain syndrome   4. Chronic hip pain, left   5. Long term current use of opiate analgesic   6. History of CVA (cerebrovascular accident)   7. History of lumbar spinal fusion   8. Acquired right foot drop   9. Lumbar degenerative disc disease   10. Controlled substance agreement signed      Updated Problems: Problem  Spinal Stenosis of  Lumbar Region  Chronic Hip Pain, Left  Chronic Pain Syndrome  Long Term Current Use of Opiate Analgesic  Chronic Bilateral Low Back Pain With Bilateral Sciatica  History of Lumbar Spinal Fusion  Lumbar Degenerative Disc Disease  Anxiety Disorder   Overview:  Overview:  Added to problem list as part of QI consistent with current medical treatment Overview:  Added to problem list as part of QI consistent with current medical  treatment   Recurrent Major Depressive Disorder, in Partial Remission ([facility])   Overview:  Added to problem list as part of QI consistent with current medical treatment   Atherosclerotic Heart Disease of Native Coronary Artery Without Angina Pectoris   Overview:  Overview:  Last cardiac catherization was 07/1999 at which time Ostial RCA DES and Proximal RCA DES placed.   Overview:  Last cardiac catherization was 07/1999 at which time Ostial RCA DES and Proximal RCA DES placed.     Pain Medication Agreement   Overview:  Patient signed pain contract with Dr Holley Raring, [facility]  Mr. KYNDAL GLOSTER has a current medication list which includes the following long-term medication(s): albuterol, atorvastatin, fluoxetine, lisinopril, nitroglycerin, and pregabalin.  Pharmacotherapy (Medications Ordered): Meds ordered this encounter  Medications  . oxyCODONE-acetaminophen (PERCOCET) 10-325 MG tablet    Sig: Take 1 tablet by mouth every 8 (eight) hours as needed for pain. For chronic pain To last for 30 days from fill date    Dispense:  90 tablet    Refill:  0    Do not place this medication, or any other prescription from our practice, on "Automatic Refill". Patient may have prescription filled one day early if pharmacy is closed on scheduled refill date.  [name] oxyCODONE-acetaminophen (PERCOCET) 10-325 MG tablet    Sig: Take 1 tablet by mouth every 8 (eight) hours as needed for pain. For chronic pain To last for 30 days from fill date    Dispense:  90  tablet    Refill:  0    Do not place this medication, or any other prescription from our practice, on "Automatic Refill". Patient may have prescription filled one day early if pharmacy is closed on scheduled refill date.  [name] oxyCODONE-acetaminophen (PERCOCET) 10-325 MG tablet    Sig: Take 1 tablet by mouth every 8 (eight) hours as needed for pain. For chronic pain To last for 30 days from fill date    Dispense:  90 tablet    Refill:  0    Do not place this medication, or any other prescription from our practice, on "Automatic Refill". Patient may have prescription filled one day early if pharmacy is closed on scheduled refill date.   Orders:  Orders Placed This Encounter  Procedures  . ToxASSURE Select 13 (MW), Urine    Volume: 30 ml(s). Minimum 3 ml of urine is needed. Document temperature of fresh sample. Indications: Long term (current) use of opiate analgesic [phone])    Order Specific Question:   Release to patient    Answer:   Immediate   Follow-up plan:   Return in about 3 months (around 09/27/2020) for Medication Management, in person.   Recent Visits Date Type Provider Dept  05/02/20 Telemedicine Gillis Santa, MD [facility]  Showing recent visits within past 90 days and meeting all other requirements Today's Visits Date Type Provider Dept  06/27/20 Office Visit Gillis Santa, MD [facility]  Showing today's visits and meeting all other requirements Future Appointments Date Type Provider Dept  09/21/20 Appointment Gillis Santa, MD [facility]  Showing future appointments within next 90 days and meeting all other requirements  I discussed the assessment and treatment plan with the patient. The patient was provided an opportunity to ask questions and all were answered. The patient agreed with the plan and demonstrated an understanding of the instructions.  Patient advised to call back or seek an in-person evaluation  if the symptoms or  condition worsens.  Duration of encounter: 42mnutes.  Note by: BGillis Santa MD Date: 06/27/2020; Time: 3:16 PM

## 2020-06-27 NOTE — Progress Notes (Signed)
Nursing Pain Medication Assessment:  Safety precautions to be maintained throughout the outpatient stay will include: orient to surroundings, keep bed in low position, maintain call bell within reach at all times, provide assistance with transfer out of bed and ambulation.  Medication Inspection Compliance: Logan Vazquez did not comply with our request to bring his pills to be counted. He was reminded that bringing the medication bottles, even when empty, is a requirement.  Medication: None brought in. Pill/Patch Count: None available to be counted. Bottle Appearance: No container available. Did not bring bottle(s) to appointment. Filled Date: N/A Last Medication intake:  Patient states he has 19 pills left on the bottle

## 2020-07-10 ENCOUNTER — Telehealth: Payer: Self-pay

## 2020-07-10 NOTE — Telephone Encounter (Signed)
Message left notifying patient that it is time to schedule the low dose lung cancer screening CT scan.  Instructed patient to return call to Shawn Perkins at [phone] to verify information prior to CT scan being scheduled.    

## 2020-07-13 ENCOUNTER — Telehealth: Payer: Self-pay | Admitting: *Deleted

## 2020-07-13 DIAGNOSIS — Z122 Encounter for screening for malignant neoplasm of respiratory organs: Secondary | ICD-10-CM

## 2020-07-13 DIAGNOSIS — Z87891 Personal history of nicotine dependence: Secondary | ICD-10-CM

## 2020-07-13 NOTE — Telephone Encounter (Signed)
Patient has been notified that annual lung cancer screening low dose CT scan is due currently or will be in near future. Confirmed that patient is within the age range of 55-77, and asymptomatic, (no signs or symptoms of lung cancer). Patient denies illness that would prevent curative treatment for lung cancer if found. Verified smoking history, (current, 117 pack year). The shared decision making visit was done 02/18/19. Patient is agreeable for CT scan being scheduled.

## 2020-07-26 ENCOUNTER — Ambulatory Visit: Payer: Medicare HMO

## 2020-08-10 ENCOUNTER — Ambulatory Visit: Admission: RE | Admit: 2020-08-10 | Payer: Medicare HMO | Source: Ambulatory Visit

## 2020-09-07 ENCOUNTER — Telehealth: Payer: Self-pay

## 2020-09-07 DIAGNOSIS — G894 Chronic pain syndrome: Secondary | ICD-10-CM

## 2020-09-07 MED ORDER — TIZANIDINE HCL 4 MG PO TABS: 4.0000 mg | ORAL_TABLET | Freq: Every day | ORAL | 2 refills | Status: DC

## 2020-09-07 MED ORDER — PREGABALIN 75 MG PO CAPS: 150.0000 mg | ORAL_CAPSULE | Freq: Every evening | ORAL | 2 refills | Status: DC | PRN

## 2020-09-07 NOTE — Telephone Encounter (Signed)
Received message from pharmacy and they were requesting refills on Lyrica and Tizanidine for this patient.   The patient was last here in July. The patient has an appointment the end of October.

## 2020-09-18 ENCOUNTER — Other Ambulatory Visit: Payer: Self-pay

## 2020-09-19 ENCOUNTER — Ambulatory Visit: Payer: Medicare HMO | Admitting: Gastroenterology

## 2020-09-21 ENCOUNTER — Encounter: Payer: Medicare HMO | Admitting: Student in an Organized Health Care Education/Training Program

## 2020-09-22 ENCOUNTER — Telehealth: Payer: Self-pay | Admitting: Student in an Organized Health Care Education/Training Program

## 2020-09-22 NOTE — Telephone Encounter (Signed)
[facility] called stating patient needs new scripts sent for Tizanidine, Buperon, pregabalin.  [phone].   Patient has appt 10-05-20 for med refill.

## 2020-10-04 ENCOUNTER — Encounter: Payer: Self-pay | Admitting: Student in an Organized Health Care Education/Training Program

## 2020-10-05 ENCOUNTER — Other Ambulatory Visit: Payer: Self-pay

## 2020-10-05 ENCOUNTER — Ambulatory Visit
Payer: Medicare HMO | Attending: Student in an Organized Health Care Education/Training Program | Admitting: Student in an Organized Health Care Education/Training Program

## 2020-10-05 ENCOUNTER — Encounter: Payer: Self-pay | Admitting: Student in an Organized Health Care Education/Training Program

## 2020-10-05 DIAGNOSIS — M48061 Spinal stenosis, lumbar region without neurogenic claudication: Secondary | ICD-10-CM | POA: Diagnosis not present

## 2020-10-05 DIAGNOSIS — Z79891 Long term (current) use of opiate analgesic: Secondary | ICD-10-CM

## 2020-10-05 DIAGNOSIS — Z981 Arthrodesis status: Secondary | ICD-10-CM

## 2020-10-05 DIAGNOSIS — Z8673 Personal history of transient ischemic attack (TIA), and cerebral infarction without residual deficits: Secondary | ICD-10-CM

## 2020-10-05 DIAGNOSIS — M25552 Pain in left hip: Secondary | ICD-10-CM

## 2020-10-05 DIAGNOSIS — Z0289 Encounter for other administrative examinations: Secondary | ICD-10-CM

## 2020-10-05 DIAGNOSIS — G8929 Other chronic pain: Secondary | ICD-10-CM

## 2020-10-05 DIAGNOSIS — G894 Chronic pain syndrome: Secondary | ICD-10-CM

## 2020-10-05 DIAGNOSIS — M5136 Other intervertebral disc degeneration, lumbar region: Secondary | ICD-10-CM

## 2020-10-05 DIAGNOSIS — M21371 Foot drop, right foot: Secondary | ICD-10-CM

## 2020-10-05 MED ORDER — OXYCODONE-ACETAMINOPHEN 10-325 MG PO TABS: 1.0000 | ORAL_TABLET | Freq: Three times a day (TID) | ORAL | 0 refills | Status: DC | PRN

## 2020-10-05 MED ORDER — PREGABALIN 75 MG PO CAPS: 150.0000 mg | ORAL_CAPSULE | Freq: Every evening | ORAL | 5 refills | Status: DC | PRN

## 2020-10-05 NOTE — Progress Notes (Signed)
Patient: Logan Vazquez  Service Category: E/M  Provider: Gillis Santa, MD  DOB: 13-Feb-1946  DOS: 10/05/2020  Location: Office  MRN: [id number]  Setting: Ambulatory outpatient  Referring Provider: Letta Median, MD  Type: Established Patient  Specialty: Interventional Pain Management  PCP: Letta Median, MD  Location: Home  Delivery: TeleHealth     Virtual Encounter - Pain Management PROVIDER NOTE: Information contained herein reflects review and annotations entered in association with encounter. Interpretation of such information and data should be left to medically-trained personnel. Information provided to patient can be located elsewhere in the medical record under "Patient Instructions". Document created using STT-dictation technology, any transcriptional errors that may result from process are unintentional.    Contact & Pharmacy Preferred: [phone] Home: [phone] (home) Mobile: There is no such number on file (mobile). E-mail: No e-mail address on record  [place] [place] [address] [place] [address] Phone: [phone] Fax: [facility] [address] [place], [place] [address] Phone: [phone] Fax: [phone]   Pre-screening  Mr. Handley offered "in-person" vs "virtual" encounter. He indicated preferring virtual for this encounter.   Reason COVID-19*  Social distancing based on CDC and AMA recommendations.   I contacted Norva Pavlov on 10/05/2020 via video conference.      I clearly identified myself as Gillis Santa, MD. I verified that I was speaking with the correct person using two identifiers (Name: DEONTAYE CIVELLO, and date of birth: [birth year]).  Consent I sought verbal advanced consent from Norva Pavlov for virtual visit interactions. I informed Mr. Dendinger of possible security and privacy concerns, risks, and limitations associated with  providing "not-in-person" medical evaluation and management services. I also informed Mr. Pellow of the availability of "in-person" appointments. Finally, I informed him that there would be a charge for the virtual visit and that he could be  personally, fully or partially, financially responsible for it. Mr. Leifheit expressed understanding and agreed to proceed.   Historic Elements   Mr. DEVRON COHICK is a 74 y.o. year old, male patient evaluated today after our last contact on 09/22/2020. Mr. Hoobler  has a past medical history of Arthritis, Asthma, CAD (coronary artery disease), Depression, Emphysema lung ([place]), Glaucoma, and Hypertension. He also  has a past surgical history that includes heart surgery (07/1999); Hip surgery (Left, 1968); Back surgery (1985); Skin graft (1968); and Humerus fracture surgery (1968). Mr. Rodier has a current medication list which includes the following prescription(s): albuterol, amlodipine, aspirin ec, atorvastatin, buspirone, cholecalciferol, b-12, docusate sodium, fluoxetine, fluoxetine, hydrocortisone, hydrocortisone, lisinopril, loperamide, mupirocin cream, nitroglycerin, omeprazole, oxycodone-acetaminophen, pregabalin, and tizanidine. He  reports that he has been smoking cigarettes. He has a 116.00 pack-year smoking history. He has never used smokeless tobacco. He reports that he does not drink alcohol and does not use drugs. Mr. Vanalstyne is allergic to ibuprofen.   HPI  Today, he is being contacted for medication management.   Patient states that he is not feeling well, has been feeling ill.  States that he has a fever.  Also is having headaches.  I encouraged him to follow-up with his primary care provider regarding this.  Otherwise, no change in medical history since last visit.  Patient's pain is at baseline.  Patient continues multimodal pain regimen as prescribed.  States that it provides pain relief and improvement in functional status.  We will provide refill  for 1 month of oxycodone and Lyrica as patient unable to come in given acute illness.  Pharmacotherapy Assessment  Analgesic: 09/30/2020  09/07/2020   1  Pregabalin 75 Mg Capsule 60.00  30  Bi Lat  [id number]  [place] ([id number])  1/2  1.00 LME  Medicare  [place]    09/07/2020  09/07/2020   1  Pregabalin 75 Mg Capsule 55.00  25  Bi Lat  [id number]  [facility] (1669)  0/2  1.11 LME  Medicare  [place]    09/02/2020  06/27/2020   1  Oxycodone-Acetaminophen 10-325 90.00  30  Bi Lat  [id number]  [facility] ([id number])  0/0  45.00 MME  Medicare  [place]       Monitoring: [place] PMP: PDMP reviewed during this encounter.       Pharmacotherapy: No side-effects or adverse reactions reported. Compliance: No problems identified. Effectiveness: Clinically acceptable. Plan: Refer to "POC".  UDS:  Summary  Date Value Ref Range Status  01/05/2019 FINAL  Final    Comment:    ==================================================================== TOXASSURE SELECT 13 (MW) ==================================================================== Test                             Result       Flag       Units Drug Present and Declared for Prescription Verification   Oxycodone                      1254         EXPECTED   ng/mg creat   Oxymorphone                    310          EXPECTED   ng/mg creat   Noroxycodone                   4592         EXPECTED   ng/mg creat   Noroxymorphone                 247          EXPECTED   ng/mg creat    Sources of oxycodone are scheduled prescription medications.    Oxymorphone, noroxycodone, and noroxymorphone are expected    metabolites of oxycodone. Oxymorphone is also available as a    scheduled prescription medication. Drug Present not Declared for Prescription Verification   Alprazolam                     15           UNEXPECTED ng/mg creat   Alpha-hydroxyalprazolam        19           UNEXPECTED ng/mg creat    Source of alprazolam is a scheduled prescription medication.    Alpha-hydroxyalprazolam is an expected  metabolite of alprazolam. ==================================================================== Test                      Result    Flag   Units      Ref Range   Creatinine              150              mg/dL      >=20 ==================================================================== Declared Medications:  The flagging and interpretation on this report are based on  the  following declared medications.  Unexpected results may arise from  inaccuracies in the declared medications.  **Note: The testing scope of this panel includes these medications:  Oxycodone (Oxycodone Acetaminophen)  **Note: The testing scope of this panel does not include following  reported medications:  Acetaminophen (Oxycodone Acetaminophen)  Albuterol  Aspirin (Aspirin 81)  Atorvastatin  Baclofen  Celecoxib  Cholecalciferol  Cyanocobalamin  Docusate  Fluoxetine  Hydrocortisone  Lisinopril  Mupirocin  Nitroglycerin  Pregabalin ==================================================================== For clinical consultation, please call [phone]. ====================================================================     Laboratory Chemistry Profile   Renal Lab Results  Component Value Date   BUN 15 10/23/2017   CREATININE 0.92 93/79/0240   BCR NOT APPLICABLE 97/35/3299     Hepatic Lab Results  Component Value Date   AST 14 10/23/2017   ALT 10 10/23/2017     Electrolytes Lab Results  Component Value Date   NA 138 10/23/2017   K 4.9 10/23/2017   CL 102 10/23/2017   CALCIUM 9.5 10/23/2017     Bone No results found for: VD25OH, VD125OH2TOT, ME2683MH9, QQ2297LG9, 25OHVITD1, 25OHVITD2, 25OHVITD3, TESTOFREE, TESTOSTERONE   Inflammation (CRP: Acute Phase) (ESR: Chronic Phase) No results found for: CRP, ESRSEDRATE, LATICACIDVEN     Note: Above Lab results reviewed.  Imaging  DG HIP UNILAT W OR W/O PELVIS 2-3 VIEWS LEFT CLINICAL DATA:  Chronic left hip pain. Previous injury and  surgery to the left hip in the 1960s.  EXAM: DG HIP (WITH OR WITHOUT PELVIS) 2-3V LEFT  COMPARISON:  None.  FINDINGS: Diffuse osteopenia. Old, healed proximal left femur fracture with a probable involuting intramedullary rod tract. Associated corticated bony fragmentation at the superior aspect of the greater trochanter. Lumbosacral spine hardware and bone fusion.  IMPRESSION: No acute abnormality. Old posttraumatic and postsurgical changes, as described above.  Electronically Signed   By: Claudie Revering M.D.   On: 02/18/2019 20:36 CT CHEST LUNG CANCER SCREENING LOW DOSE WO CONTRAST CLINICAL DATA:  74 year old male current smoker, with 116 pack-year history of smoking, for initial lung cancer screening  EXAM: CT CHEST WITHOUT CONTRAST LOW-DOSE FOR LUNG CANCER SCREENING  TECHNIQUE: Multidetector CT imaging of the chest was performed following the standard protocol without IV contrast.  COMPARISON:  None.  FINDINGS: Cardiovascular: The heart is normal in size. No pericardial effusion.  No evidence of thoracic aortic aneurysm. Atherosclerotic calcifications of the aortic arch.  Three vessel coronary atherosclerosis.  Mediastinum/Nodes: No suspicious mediastinal lymphadenopathy.  Visualized thyroid is unremarkable.  Lungs/Pleura: Mild paraseptal emphysematous changes, upper lobe predominant.  Mild subpleural reticulation/fibrosis in the bilateral lower lobes.  No focal consolidation.  No suspicious pulmonary nodules.  No pleural effusion or pneumothorax.  Upper Abdomen: Visualized upper abdomen is notable for vascular calcifications, prior cholecystectomy, and a 3.4 cm posterior interpolar left renal cyst.  Musculoskeletal: Degenerative changes of the visualized thoracolumbar spine.  IMPRESSION: Lung-RADS 1, negative. Continue annual screening with low-dose chest CT without contrast in 12 months.  Aortic Atherosclerosis (ICD10-I70.0) and Emphysema  (ICD10-J43.9).  Electronically Signed   By: Julian Hy M.D.   On: 02/18/2019 15:25  Assessment  The primary encounter diagnosis was Spinal stenosis of lumbar region, unspecified whether neurogenic claudication present. Diagnoses of Chronic hip pain, left, Long term current use of opiate analgesic, Pain medication agreement, History of CVA (cerebrovascular accident), History of lumbar spinal fusion, Acquired right foot drop, Lumbar degenerative disc disease, and Chronic pain syndrome were also pertinent to this visit.  Plan of Care   Mr. Jahmad  L Friedland has a current medication list which includes the following long-term medication(s): albuterol, amlodipine, atorvastatin, fluoxetine, fluoxetine, lisinopril, nitroglycerin, and pregabalin.  Pharmacotherapy (Medications Ordered): Meds ordered this encounter  Medications  . oxyCODONE-acetaminophen (PERCOCET) 10-325 MG tablet    Sig: Take 1 tablet by mouth every 8 (eight) hours as needed for pain. For chronic pain syndrome    Dispense:  90 tablet    Refill:  0  . pregabalin (LYRICA) 75 MG capsule    Sig: Take 2 capsules (150 mg total) by mouth at bedtime as needed.    Dispense:  60 capsule    Refill:  5   Follow-up plan:   Return in about 4 weeks (around 11/02/2020) for Medication Management, in person.   Recent Visits No visits were found meeting these conditions. Showing recent visits within past 90 days and meeting all other requirements Today's Visits Date Type Provider Dept  10/05/20 Telemedicine Gillis Santa, MD [facility]  Showing today's visits and meeting all other requirements Future Appointments Date Type Provider Dept  10/30/20 Appointment Gillis Santa, MD [facility]  Showing future appointments within next 90 days and meeting all other requirements  I discussed the assessment and treatment plan with the patient. The patient was provided an opportunity to ask questions and all were answered.  The patient agreed with the plan and demonstrated an understanding of the instructions.  Patient advised to call back or seek an in-person evaluation if the symptoms or condition worsens.  Duration of encounter:30 minutes.  Note by: Gillis Santa, MD Date: 10/05/2020; Time: 1:19 PM

## 2020-10-30 ENCOUNTER — Encounter: Payer: Self-pay | Admitting: Student in an Organized Health Care Education/Training Program

## 2020-10-30 ENCOUNTER — Other Ambulatory Visit: Payer: Self-pay

## 2020-10-30 ENCOUNTER — Ambulatory Visit
Payer: Medicare HMO | Attending: Student in an Organized Health Care Education/Training Program | Admitting: Student in an Organized Health Care Education/Training Program

## 2020-10-30 VITALS — BP 117/65 | HR 82 | Temp 97.1°F | Resp 16 | Ht 69.0 in | Wt 129.0 lb

## 2020-10-30 DIAGNOSIS — Z79891 Long term (current) use of opiate analgesic: Secondary | ICD-10-CM | POA: Diagnosis present

## 2020-10-30 DIAGNOSIS — M48061 Spinal stenosis, lumbar region without neurogenic claudication: Secondary | ICD-10-CM | POA: Diagnosis present

## 2020-10-30 DIAGNOSIS — Z0289 Encounter for other administrative examinations: Secondary | ICD-10-CM | POA: Insufficient documentation

## 2020-10-30 DIAGNOSIS — G894 Chronic pain syndrome: Secondary | ICD-10-CM

## 2020-10-30 DIAGNOSIS — Z981 Arthrodesis status: Secondary | ICD-10-CM | POA: Insufficient documentation

## 2020-10-30 DIAGNOSIS — G8929 Other chronic pain: Secondary | ICD-10-CM | POA: Diagnosis present

## 2020-10-30 DIAGNOSIS — Z8673 Personal history of transient ischemic attack (TIA), and cerebral infarction without residual deficits: Secondary | ICD-10-CM | POA: Diagnosis present

## 2020-10-30 DIAGNOSIS — M25552 Pain in left hip: Secondary | ICD-10-CM | POA: Diagnosis present

## 2020-10-30 MED ORDER — OXYCODONE-ACETAMINOPHEN 10-325 MG PO TABS: 1.0000 | ORAL_TABLET | Freq: Three times a day (TID) | ORAL | 0 refills | Status: DC | PRN

## 2020-10-30 MED ORDER — PREGABALIN 75 MG PO CAPS: 150.0000 mg | ORAL_CAPSULE | Freq: Every evening | ORAL | 5 refills | Status: DC | PRN

## 2020-10-30 MED ORDER — TIZANIDINE HCL 4 MG PO TABS: 4.0000 mg | ORAL_TABLET | Freq: Every day | ORAL | 2 refills | Status: DC

## 2020-10-30 NOTE — Addendum Note (Signed)
Addended by: Edward Jolly on: 10/30/2020 02:04 PM   Modules accepted: Orders

## 2020-10-30 NOTE — Progress Notes (Signed)
Nursing Pain Medication Assessment:  Safety precautions to be maintained throughout the outpatient stay will include: orient to surroundings, keep bed in low position, maintain call bell within reach at all times, provide assistance with transfer out of bed and ambulation.  Medication Inspection Compliance: Logan Vazquez did not comply with our request to bring his pills to be counted. He was reminded that bringing the medication bottles, even when empty, is a requirement.  Medication: None brought in. Pill/Patch Count: None available to be counted. Bottle Appearance: No container available. Did not bring bottle(s) to appointment. Filled Date: N/A Last Medication intake:  Yesterday   Patient reports that the fill date on the bottle is 10/05/20 and there were 8 tablets left.

## 2020-10-30 NOTE — Progress Notes (Addendum)
PROVIDER NOTE: Information contained herein reflects review and annotations entered in association with encounter. Interpretation of such information and data should be left to medically-trained personnel. Information provided to patient can be located elsewhere in the medical record under "Patient Instructions". Document created using STT-dictation technology, any transcriptional errors that may result from process are unintentional.    Patient: Logan Vazquez  Service Category: E/M  Provider: Gillis Santa, MD  DOB: [birth year]  DOS: 10/30/2020  Specialty: Interventional Pain Management  MRN: [id number]  Setting: Ambulatory outpatient  PCP: Letta Median, MD  Type: Established Patient    Referring Provider: Letta Median, MD  Location: Office  Delivery: Face-to-face     HPI  Mr. RAIF CHACHERE, a 74 y.o. year old male, is here today because of his Spinal stenosis of lumbar region, unspecified whether neurogenic claudication present [M48.061]. Mr. Lange primary complain today is Back Pain (lumbar right ), Hip Pain (left ), and Leg Pain (right ) Last encounter: My last encounter with him was on 09/22/2020. Pertinent problems: Mr. Greulich has Anxiety disorder; Atherosclerotic heart disease of native coronary artery without angina pectoris; History of lumbar spinal fusion; Lumbar degenerative disc disease; Recurrent major depressive disorder, in partial remission ([place]); Chronic pain syndrome; Long term current use of opiate analgesic; Chronic bilateral low back pain with bilateral sciatica; Chronic hip pain, left; and Spinal stenosis of lumbar region on their pertinent problem list. Pain Assessment: Severity of Chronic pain is reported as a 8 /10. Location: Back (see visit info for aditional sites.) Lower, Left/hip pain into right leg. Onset: More than a month ago. Quality: Discomfort, Constant, Dull. Timing: Constant. Modifying factor(s): medications. Vitals:  height is _0  (1.753 m) and  weight is 129 lb (58.5 kg). His temporal temperature is 97.1 F (36.2 C) (abnormal). His blood pressure is 117/65 and his pulse is 82. His respiration is 16 and oxygen saturation is 95%.   Reason for encounter: medication management.   No change in medical history since last visit.  Patient's pain is at baseline.  Patient continues multimodal pain regimen as prescribed.  States that it provides pain relief and improvement in functional status. Patient states that he has overutilize his medication given increased acute pain.  He has 8 tablets left which he will manage over the next 5 days prior to his refill on 11/04/20  Pharmacotherapy Assessment   10/05/2020  10/05/2020   1  Oxycodone-Acetaminophen 10-325 90.00  30  [place]  [id number]  [place] (1669)  0/0  45.00 MME  Medicare  [place]      Analgesic: Percocet 10 mg 3 times daily as needed, quantity 90/month; MME equals 45    Monitoring: [place] PMP: PDMP reviewed during this encounter.       Pharmacotherapy: No side-effects or adverse reactions reported. Compliance: No problems identified. Effectiveness: Clinically acceptable.  Janett Billow, RN  10/30/2020  1:39 PM  Signed Nursing Pain Medication Assessment:  Safety precautions to be maintained throughout the outpatient stay will include: orient to surroundings, keep bed in low position, maintain call bell within reach at all times, provide assistance with transfer out of bed and ambulation.  Medication Inspection Compliance: Mr. Altice did not comply with our request to bring his pills to be counted. He was reminded that bringing the medication bottles, even when empty, is a requirement.  Medication: None brought in. Pill/Patch Count: None available to be counted. Bottle Appearance: No container available. Did not bring bottle(s) to appointment. Filled  Date: N/A Last Medication intake:  Yesterday   Patient reports that the fill date on the bottle is 10/05/20 and there were 8 tablets left.      UDS:  Summary  Date Value Ref Range Status  01/05/2019 FINAL  Final    Comment:    ==================================================================== TOXASSURE SELECT 13 (MW) ==================================================================== Test                             Result       Flag       Units Drug Present and Declared for Prescription Verification   Oxycodone                      1254         EXPECTED   ng/mg creat   Oxymorphone                    310          EXPECTED   ng/mg creat   Noroxycodone                   4592         EXPECTED   ng/mg creat   Noroxymorphone                 247          EXPECTED   ng/mg creat    Sources of oxycodone are scheduled prescription medications.    Oxymorphone, noroxycodone, and noroxymorphone are expected    metabolites of oxycodone. Oxymorphone is also available as a    scheduled prescription medication. Drug Present not Declared for Prescription Verification   Alprazolam                     15           UNEXPECTED ng/mg creat   Alpha-hydroxyalprazolam        19           UNEXPECTED ng/mg creat    Source of alprazolam is a scheduled prescription medication.    Alpha-hydroxyalprazolam is an expected metabolite of alprazolam. ==================================================================== Test                      Result    Flag   Units      Ref Range   Creatinine              150              mg/dL      >=20 ==================================================================== Declared Medications:  The flagging and interpretation on this report are based on the  following declared medications.  Unexpected results may arise from  inaccuracies in the declared medications.  **Note: The testing scope of this panel includes these medications:  Oxycodone (Oxycodone Acetaminophen)  **Note: The testing scope of this panel does not include following  reported medications:  Acetaminophen (Oxycodone Acetaminophen)  Albuterol   Aspirin (Aspirin 81)  Atorvastatin  Baclofen  Celecoxib  Cholecalciferol  Cyanocobalamin  Docusate  Fluoxetine  Hydrocortisone  Lisinopril  Mupirocin  Nitroglycerin  Pregabalin ==================================================================== For clinical consultation, please call [phone]. ====================================================================      ROS  Constitutional: Denies any fever or chills Gastrointestinal: No reported hemesis, hematochezia, vomiting, or acute GI distress Musculoskeletal: Denies any acute onset joint swelling, redness, loss of ROM, or weakness Neurological: No reported episodes of  acute onset apraxia, aphasia, dysarthria, agnosia, amnesia, paralysis, loss of coordination, or loss of consciousness  Medication Review  B-12, Cholecalciferol, FLUoxetine, albuterol, amLODipine, aspirin EC, atorvastatin, busPIRone, docusate sodium, hydrocortisone, lisinopril, loperamide, mupirocin cream, nitroGLYCERIN, omeprazole, oxyCODONE-acetaminophen, pregabalin, and tiZANidine  History Review  Allergy: Mr. Leitz is allergic to ibuprofen. Drug: Mr. Runyon  reports no history of drug use. Alcohol:  reports no history of alcohol use. Tobacco:  reports that he has been smoking cigarettes. He has a 116.00 pack-year smoking history. He has never used smokeless tobacco. Social: Mr. Kolenovic  reports that he has been smoking cigarettes. He has a 116.00 pack-year smoking history. He has never used smokeless tobacco. He reports that he does not drink alcohol and does not use drugs. Medical:  has a past medical history of Arthritis, Asthma, CAD (coronary artery disease), Depression, Emphysema lung ([place]), Glaucoma, and Hypertension. Surgical: Mr. Gervasi  has a past surgical history that includes heart surgery (07/1999); Hip surgery (Left, 1968); Back surgery (1985); Skin graft (1968); and Humerus fracture surgery (1968). Family: family history includes Cancer in  his father; Hearing loss in his mother; Heart attack in his brother; Heart disease in his brother; Hypertension in his father and mother; Stroke in his sister.  Laboratory Chemistry Profile   Renal Lab Results  Component Value Date   BUN 15 10/23/2017   CREATININE 0.92 31/54/0086   BCR NOT APPLICABLE 76/19/5093     Hepatic Lab Results  Component Value Date   AST 14 10/23/2017   ALT 10 10/23/2017     Electrolytes Lab Results  Component Value Date   NA 138 10/23/2017   K 4.9 10/23/2017   CL 102 10/23/2017   CALCIUM 9.5 10/23/2017     Bone No results found for: VD25OH, VD125OH2TOT, OI7124PY0, DX8338SN0, 25OHVITD1, 25OHVITD2, 25OHVITD3, TESTOFREE, TESTOSTERONE   Inflammation (CRP: Acute Phase) (ESR: Chronic Phase) No results found for: CRP, ESRSEDRATE, LATICACIDVEN     Note: Above Lab results reviewed.  Recent Imaging Review  DG HIP UNILAT W OR W/O PELVIS 2-3 VIEWS LEFT CLINICAL DATA:  Chronic left hip pain. Previous injury and surgery to the left hip in the 1960s.  EXAM: DG HIP (WITH OR WITHOUT PELVIS) 2-3V LEFT  COMPARISON:  None.  FINDINGS: Diffuse osteopenia. Old, healed proximal left femur fracture with a probable involuting intramedullary rod tract. Associated corticated bony fragmentation at the superior aspect of the greater trochanter. Lumbosacral spine hardware and bone fusion.  IMPRESSION: No acute abnormality. Old posttraumatic and postsurgical changes, as described above.  Electronically Signed   By: Claudie Revering M.D.   On: 02/18/2019 20:36 CT CHEST LUNG CANCER SCREENING LOW DOSE WO CONTRAST CLINICAL DATA:  74 year old male current smoker, with 116 pack-year history of smoking, for initial lung cancer screening  EXAM: CT CHEST WITHOUT CONTRAST LOW-DOSE FOR LUNG CANCER SCREENING  TECHNIQUE: Multidetector CT imaging of the chest was performed following the standard protocol without IV contrast.  COMPARISON:   None.  FINDINGS: Cardiovascular: The heart is normal in size. No pericardial effusion.  No evidence of thoracic aortic aneurysm. Atherosclerotic calcifications of the aortic arch.  Three vessel coronary atherosclerosis.  Mediastinum/Nodes: No suspicious mediastinal lymphadenopathy.  Visualized thyroid is unremarkable.  Lungs/Pleura: Mild paraseptal emphysematous changes, upper lobe predominant.  Mild subpleural reticulation/fibrosis in the bilateral lower lobes.  No focal consolidation.  No suspicious pulmonary nodules.  No pleural effusion or pneumothorax.  Upper Abdomen: Visualized upper abdomen is notable for vascular calcifications, prior cholecystectomy, and a 3.4 cm posterior interpolar  left renal cyst.  Musculoskeletal: Degenerative changes of the visualized thoracolumbar spine.  IMPRESSION: Lung-RADS 1, negative. Continue annual screening with low-dose chest CT without contrast in 12 months.  Aortic Atherosclerosis (ICD10-I70.0) and Emphysema (ICD10-J43.9).  Electronically Signed   By: Julian Hy M.D.   On: 02/18/2019 15:25 Note: Reviewed        Physical Exam  General appearance: Well nourished, well developed, and well hydrated. In no apparent acute distress Mental status: Alert, oriented x 3 (person, place, & time)       Respiratory: No evidence of acute respiratory distress Eyes: PERLA Vitals: BP 117/65 (BP Location: Right Arm, Patient Position: Sitting, Cuff Size: Normal)   Pulse 82   Temp (!) 97.1 F (36.2 C) (Temporal)   Resp 16   Ht _0  (1.753 m)   Wt 129 lb (58.5 kg)   SpO2 95%   BMI 19.05 kg/m  BMI: Estimated body mass index is 19.05 kg/m as calculated from the following:   Height as of this encounter: _1  (1.753 m).   Weight as of this encounter: 129 lb (58.5 kg). Ideal: Ideal body weight: 70.7 kg (155 lb 13.8 oz)  Lumbar Spine Area Exam  Skin & Axial Inspection:  Well healed scar from prior surgery Alignment:  Symmetrical Functional ROM: Pain restricted ROM affecting both sides Stability: No instability detected Muscle Tone/Strength: Functionally intact. No obvious neuro-muscular anomalies detected. Sensory (Neurological): Dermatomal pain pattern and musculoskeletal  Gait & Posture Assessment  Ambulation: Limited Gait: Significantly limited. Dependent on assistive device to ambulate Posture: Difficulty standing up straight, due to pain  Lower Extremity Exam    Side: Right lower extremity  Side: Left lower extremity  Stability: No instability observed          Stability: No instability observed          Skin & Extremity Inspection: Skin color, temperature, and hair growth are WNL. No peripheral edema or cyanosis. No masses, redness, swelling, asymmetry, or associated skin lesions. No contractures.  Skin & Extremity Inspection: Skin color, temperature, and hair growth are WNL. No peripheral edema or cyanosis. No masses, redness, swelling, asymmetry, or associated skin lesions. No contractures.  Functional ROM: Decreased ROM for hip and knee joints          Functional ROM: Decreased ROM for hip and knee joints          Muscle Tone/Strength: Functionally intact. No obvious neuro-muscular anomalies detected.  Muscle Tone/Strength: Functionally intact. No obvious neuro-muscular anomalies detected.  Sensory (Neurological): Musculoskeletal pain pattern        Sensory (Neurological): Musculoskeletal pain pattern        DTR: Patellar: deferred today Achilles: deferred today Plantar: deferred today  DTR: Patellar: deferred today Achilles: deferred today Plantar: deferred today  Palpation: No palpable anomalies  Palpation: No palpable anomalies     Assessment   Status Diagnosis  Controlled Controlled Controlled 1. Spinal stenosis of lumbar region, unspecified whether neurogenic claudication present   2. Chronic pain syndrome   3. Chronic hip pain, left   4. Long term current use of  opiate analgesic   5. Pain medication agreement   6. History of CVA (cerebrovascular accident)   7. History of lumbar spinal fusion       Plan of Care  Mr. DUILIO HERITAGE has a current medication list which includes the following long-term medication(s): albuterol, amlodipine, atorvastatin, fluoxetine, fluoxetine, lisinopril, nitroglycerin, [START ON 11/04/2020] oxycodone-acetaminophen, [START ON 12/04/2020] oxycodone-acetaminophen, [START ON 01/03/2021] oxycodone-acetaminophen,  and pregabalin.  Pharmacotherapy (Medications Ordered): Meds ordered this encounter  Medications  . oxyCODONE-acetaminophen (PERCOCET) 10-325 MG tablet    Sig: Take 1 tablet by mouth every 8 (eight) hours as needed for pain. For chronic pain syndrome    Dispense:  90 tablet    Refill:  0  . oxyCODONE-acetaminophen (PERCOCET) 10-325 MG tablet    Sig: Take 1 tablet by mouth every 8 (eight) hours as needed for pain. For chronic pain syndrome    Dispense:  90 tablet    Refill:  0  . oxyCODONE-acetaminophen (PERCOCET) 10-325 MG tablet    Sig: Take 1 tablet by mouth every 8 (eight) hours as needed for pain. For chronic pain syndrome    Dispense:  90 tablet    Refill:  0  . pregabalin (LYRICA) 75 MG capsule    Sig: Take 2 capsules (150 mg total) by mouth at bedtime as needed.    Dispense:  60 capsule    Refill:  5  . tiZANidine (ZANAFLEX) 4 MG tablet    Sig: Take 1 tablet (4 mg total) by mouth at bedtime.    Dispense:  90 tablet    Refill:  2    Do not place this medication, or any other prescription from our practice, on "Automatic Refill". Patient may have prescription filled one day early if pharmacy is closed on scheduled refill date.   Orders:  Orders Placed This Encounter  Procedures  . ToxASSURE Select 13 (MW), Urine    Volume: 30 ml(s). Minimum 3 ml of urine is needed. Document temperature of fresh sample. Indications: Long term (current) use of opiate analgesic (P16.742)    Order Specific Question:    Release to patient    Answer:   Immediate  . Drug Screen 10 W/Conf, Serum    Order Specific Question:   Release to patient    Answer:   Immediate   Follow-up plan:   Return in about 3 months (around 01/30/2021) for Medication Management, in person.   Recent Visits Date Type Provider Dept  10/05/20 Telemedicine Gillis Santa, MD [facility]  Showing recent visits within past 90 days and meeting all other requirements Today's Visits Date Type Provider Dept  10/30/20 Office Visit Gillis Santa, MD [facility]  Showing today's visits and meeting all other requirements Future Appointments No visits were found meeting these conditions. Showing future appointments within next 90 days and meeting all other requirements  I discussed the assessment and treatment plan with the patient. The patient was provided an opportunity to ask questions and all were answered. The patient agreed with the plan and demonstrated an understanding of the instructions.  Patient advised to call back or seek an in-person evaluation if the symptoms or condition worsens.  Duration of encounter: 25mnutes.  Note by: BGillis Santa MD Date: 10/30/2020; Time: 2:03 PM

## 2020-11-05 ENCOUNTER — Telehealth: Payer: Self-pay

## 2020-11-05 NOTE — Telephone Encounter (Signed)
Patient contacted and scheduled for CT lung screening scan on 11/23/20 at 2pm at [facility]. Verified with patient smoking status. He is a current smoker and is smoking about 1/2 pack per day currently and has been a smoker for approximately 58 years. No major changes in medical condition since last year. No changes in insurance since last year, but he is thinking of switiching. Pt also stated that he will look for transportation for appt, but if he was unable to keep he would call and let us know.

## 2020-11-06 NOTE — Telephone Encounter (Signed)
Current smoker, 117 pack year

## 2020-11-14 LAB — DRUG SCREEN 10 W/CONF, SERUM
Amphetamines, IA: NEGATIVE ng/mL
Barbiturates, IA: NEGATIVE ug/mL
Benzodiazepines, IA: NEGATIVE ng/mL
Cocaine & Metabolite, IA: NEGATIVE ng/mL
Methadone, IA: NEGATIVE ng/mL
Opiates, IA: NEGATIVE ng/mL
Oxycodones, IA: POSITIVE ng/mL — AB
Phencyclidine, IA: NEGATIVE ng/mL
Propoxyphene, IA: NEGATIVE ng/mL
THC(Marijuana) Metabolite, IA: NEGATIVE ng/mL

## 2020-11-14 LAB — OXYCODONES,MS,WB/SP RFX
Oxycocone: 10 ng/mL
Oxycodones Confirmation: POSITIVE
Oxymorphone: NEGATIVE ng/mL

## 2020-11-16 NOTE — Telephone Encounter (Signed)
Should be done now

## 2020-11-23 ENCOUNTER — Ambulatory Visit: Admission: RE | Admit: 2020-11-23 | Payer: Medicare HMO | Source: Ambulatory Visit

## 2020-12-25 ENCOUNTER — Telehealth: Payer: Self-pay | Admitting: Student in an Organized Health Care Education/Training Program

## 2020-12-25 ENCOUNTER — Telehealth: Payer: Self-pay | Admitting: *Deleted

## 2020-12-25 DIAGNOSIS — G894 Chronic pain syndrome: Secondary | ICD-10-CM

## 2020-12-25 DIAGNOSIS — M48061 Spinal stenosis, lumbar region without neurogenic claudication: Secondary | ICD-10-CM

## 2020-12-25 NOTE — Telephone Encounter (Signed)
Patient fell last week and is having pain in his back. Wants to know if Dr. Cherylann Ratel will put in some orders for xrays and advise him what is wrong.

## 2020-12-25 NOTE — Telephone Encounter (Signed)
Called to patient about the fall.  States he lost his balance 3 times and fell.  Is having back pain in mid lower back.  Unable to look and see if there is any bruising or swelling.

## 2020-12-25 NOTE — Telephone Encounter (Signed)
Called patient to let him know that Dr Cherylann Ratel has placed order for lumbar xrays.

## 2020-12-26 ENCOUNTER — Ambulatory Visit
Admission: RE | Admit: 2020-12-26 | Discharge: 2020-12-26 | Disposition: A | Discharge status: Home / Self Care | Payer: Medicare HMO | Source: Ambulatory Visit | Attending: Student in an Organized Health Care Education/Training Program | Admitting: Student in an Organized Health Care Education/Training Program

## 2020-12-26 ENCOUNTER — Ambulatory Visit
Admission: RE | Admit: 2020-12-26 | Discharge: 2020-12-26 | Disposition: A | Discharge status: Home / Self Care | Payer: Medicare HMO | Attending: Student in an Organized Health Care Education/Training Program | Admitting: Student in an Organized Health Care Education/Training Program

## 2020-12-26 DIAGNOSIS — M48061 Spinal stenosis, lumbar region without neurogenic claudication: Secondary | ICD-10-CM | POA: Diagnosis present

## 2020-12-26 DIAGNOSIS — G894 Chronic pain syndrome: Secondary | ICD-10-CM | POA: Insufficient documentation

## 2020-12-27 ENCOUNTER — Telehealth: Payer: Self-pay | Admitting: *Deleted

## 2020-12-27 NOTE — Telephone Encounter (Signed)
Attempted to call to inform patient of x-ray results. No answer, unable to leave a message.

## 2020-12-28 ENCOUNTER — Telehealth: Payer: Self-pay | Admitting: *Deleted

## 2020-12-28 NOTE — Telephone Encounter (Signed)
X-ray results of lumbar spine read to patient.

## 2021-01-30 ENCOUNTER — Encounter: Payer: Medicare HMO | Admitting: Student in an Organized Health Care Education/Training Program

## 2021-02-01 ENCOUNTER — Encounter: Payer: Medicare HMO | Admitting: Student in an Organized Health Care Education/Training Program

## 2021-02-07 ENCOUNTER — Other Ambulatory Visit: Payer: Self-pay | Admitting: Student in an Organized Health Care Education/Training Program

## 2021-02-07 ENCOUNTER — Ambulatory Visit
Admission: RE | Admit: 2021-02-07 | Discharge: 2021-02-07 | Disposition: A | Discharge status: Home / Self Care | Payer: Medicare HMO | Source: Ambulatory Visit | Attending: Student in an Organized Health Care Education/Training Program | Admitting: Student in an Organized Health Care Education/Training Program

## 2021-02-07 ENCOUNTER — Encounter: Payer: Self-pay | Admitting: Student in an Organized Health Care Education/Training Program

## 2021-02-07 ENCOUNTER — Other Ambulatory Visit: Payer: Self-pay

## 2021-02-07 ENCOUNTER — Ambulatory Visit (HOSPITAL_BASED_OUTPATIENT_CLINIC_OR_DEPARTMENT_OTHER): Payer: Medicare HMO | Admitting: Student in an Organized Health Care Education/Training Program

## 2021-02-07 VITALS — BP 126/74 | Temp 97.9°F | Resp 18 | Ht 69.0 in | Wt 109.0 lb

## 2021-02-07 DIAGNOSIS — M47812 Spondylosis without myelopathy or radiculopathy, cervical region: Secondary | ICD-10-CM

## 2021-02-07 DIAGNOSIS — G894 Chronic pain syndrome: Secondary | ICD-10-CM | POA: Insufficient documentation

## 2021-02-07 DIAGNOSIS — Z981 Arthrodesis status: Secondary | ICD-10-CM

## 2021-02-07 DIAGNOSIS — Z79891 Long term (current) use of opiate analgesic: Secondary | ICD-10-CM | POA: Insufficient documentation

## 2021-02-07 DIAGNOSIS — M47894 Other spondylosis, thoracic region: Secondary | ICD-10-CM

## 2021-02-07 DIAGNOSIS — M21371 Foot drop, right foot: Secondary | ICD-10-CM | POA: Insufficient documentation

## 2021-02-07 DIAGNOSIS — Z8673 Personal history of transient ischemic attack (TIA), and cerebral infarction without residual deficits: Secondary | ICD-10-CM | POA: Insufficient documentation

## 2021-02-07 DIAGNOSIS — M48061 Spinal stenosis, lumbar region without neurogenic claudication: Secondary | ICD-10-CM

## 2021-02-07 DIAGNOSIS — M25552 Pain in left hip: Secondary | ICD-10-CM

## 2021-02-07 DIAGNOSIS — Z0289 Encounter for other administrative examinations: Secondary | ICD-10-CM | POA: Insufficient documentation

## 2021-02-07 DIAGNOSIS — G8929 Other chronic pain: Secondary | ICD-10-CM | POA: Insufficient documentation

## 2021-02-07 MED ORDER — TIZANIDINE HCL 4 MG PO TABS
4.0000 mg | ORAL_TABLET | Freq: Every day | ORAL | 2 refills | Status: DC
Start: 2021-02-07 — End: 2021-06-05

## 2021-02-07 MED ORDER — OXYCODONE-ACETAMINOPHEN 10-325 MG PO TABS: 1.0000 | ORAL_TABLET | Freq: Three times a day (TID) | ORAL | 0 refills | Status: DC | PRN

## 2021-02-07 MED ORDER — PREGABALIN 75 MG PO CAPS
150.0000 mg | ORAL_CAPSULE | Freq: Every evening | ORAL | 5 refills | Status: DC | PRN
Start: 2021-02-07 — End: 2021-06-05

## 2021-02-07 MED ORDER — OXYCODONE-ACETAMINOPHEN 10-325 MG PO TABS
1.0000 | ORAL_TABLET | Freq: Three times a day (TID) | ORAL | 0 refills | Status: DC | PRN
Start: 2021-04-08 — End: 2021-06-05

## 2021-02-07 NOTE — Progress Notes (Signed)
PROVIDER NOTE: Information contained herein reflects review and annotations entered in association with encounter. Interpretation of such information and data should be left to medically-trained personnel. Information provided to patient can be located elsewhere in the medical record under "Patient Instructions". Document created using STT-dictation technology, any transcriptional errors that may result from process are unintentional.    Patient: Logan Vazquez  Service Category: E/M  Provider: Gillis Santa, MD  DOB: [birth year]  DOS: 02/07/2021  Specialty: Interventional Pain Management  MRN: [id number]  Setting: Ambulatory outpatient  PCP: Letta Median, MD  Type: Established Patient    Referring Provider: Letta Median, MD  Location: Office  Delivery: Face-to-face     HPI  Logan Vazquez, a 75 y.o. year old male, is here today because of his Cervical facet joint syndrome [M47.812]. Mr. Harkin primary complain today is Back Pain (upper) Last encounter: My last encounter with him was on 12/25/2020. Pertinent problems: Mr. Swoveland has Anxiety disorder; Atherosclerotic heart disease of native coronary artery without angina pectoris; History of lumbar spinal fusion; Lumbar degenerative disc disease; Recurrent major depressive disorder, in partial remission ([place]); Chronic pain syndrome; Long term current use of opiate analgesic; Chronic bilateral low back pain with bilateral sciatica; Chronic hip pain, left; and Spinal stenosis of lumbar region on their pertinent problem list. Pain Assessment: Severity of Chronic pain is reported as a 9 /10. Location: Back Upper/ . Onset: More than a month ago. Quality: Throbbing,Aching,Constant. Timing: Constant. Modifying factor(s): medications, rest. Vitals:  height is 5' 9" (1.753 m) and weight is 109 lb (49.4 kg). His temporal temperature is 97.9 F (36.6 C). His blood pressure is 126/74. His respiration is 18 and oxygen saturation is 98%.   Reason for  encounter: medication management.    Patient presents today for medication refill.  States that he ran out of his medication, Percocet last week.  Is having increased pain in his periscapular region, cervical spine and thoracic spine.  Is also having some musculoskeletal pain of his left lower lumbar spine.  Otherwise he continues his multimodal analgesics as prescribed.  States that he has been losing weight as a result of persistent diarrhea.  Recommend that he follow-up with his primary care provider to discuss this.  Otherwise continue multimodal analgesics as prescribed, refill as below.  Would like to obtain x-rays of thoracic, cervical spine given increased pain in those regions.  Follow-up in 3 months for medication management.  Will call patient with x-ray results and discuss treatment plan.  Pharmacotherapy Assessment   Analgesic: Percocet 10 mg 3 times daily as needed, quantity 90/month; MME equals 45    Monitoring: [place] PMP: PDMP reviewed during this encounter.       Pharmacotherapy: No side-effects or adverse reactions reported. Compliance: No problems identified. Effectiveness: Clinically acceptable.  Hart Rochester, RN  02/07/2021  1:17 PM  Sign when Signing Visit Nursing Pain Medication Assessment:  Safety precautions to be maintained throughout the outpatient stay will include: orient to surroundings, keep bed in low position, maintain call bell within reach at all times, provide assistance with transfer out of bed and ambulation.  Medication Inspection Compliance: Mr. Sciarra did not comply with our request to bring his pills to be counted. He was reminded that bringing the medication bottles, even when empty, is a requirement.  Medication: None brought in. Pill/Patch Count: None available to be counted. Bottle Appearance: No container available. Did not bring bottle(s) to appointment. Filled Date: N/A Last Medication intake:  Day before yesterday    UDS:  Summary  Date  Value Ref Range Status  01/05/2019 FINAL  Final    Comment:    ==================================================================== TOXASSURE SELECT 13 (MW) ==================================================================== Test                             Result       Flag       Units Drug Present and Declared for Prescription Verification   Oxycodone                      1254         EXPECTED   ng/mg creat   Oxymorphone                    310          EXPECTED   ng/mg creat   Noroxycodone                   4592         EXPECTED   ng/mg creat   Noroxymorphone                 247          EXPECTED   ng/mg creat    Sources of oxycodone are scheduled prescription medications.    Oxymorphone, noroxycodone, and noroxymorphone are expected    metabolites of oxycodone. Oxymorphone is also available as a    scheduled prescription medication. Drug Present not Declared for Prescription Verification   Alprazolam                     15           UNEXPECTED ng/mg creat   Alpha-hydroxyalprazolam        19           UNEXPECTED ng/mg creat    Source of alprazolam is a scheduled prescription medication.    Alpha-hydroxyalprazolam is an expected metabolite of alprazolam. ==================================================================== Test                      Result    Flag   Units      Ref Range   Creatinine              150              mg/dL      >=20 ==================================================================== Declared Medications:  The flagging and interpretation on this report are based on the  following declared medications.  Unexpected results may arise from  inaccuracies in the declared medications.  **Note: The testing scope of this panel includes these medications:  Oxycodone (Oxycodone Acetaminophen)  **Note: The testing scope of this panel does not include following  reported medications:  Acetaminophen (Oxycodone Acetaminophen)  Albuterol  Aspirin (Aspirin 81)   Atorvastatin  Baclofen  Celecoxib  Cholecalciferol  Cyanocobalamin  Docusate  Fluoxetine  Hydrocortisone  Lisinopril  Mupirocin  Nitroglycerin  Pregabalin ==================================================================== For clinical consultation, please call [phone]. ====================================================================      ROS  Constitutional: Denies any fever or chills Gastrointestinal: No reported hemesis, hematochezia, vomiting, or acute GI distress Musculoskeletal: Cervical, thoracic, lumbar pain Neurological: No reported episodes of acute onset apraxia, aphasia, dysarthria, agnosia, amnesia, paralysis, loss of coordination, or loss of consciousness  Medication Review  B-12, Cholecalciferol, FLUoxetine, albuterol, amLODipine, aspirin EC, atorvastatin, busPIRone, docusate sodium, hydrocortisone, lisinopril,  loperamide, mupirocin cream, nitroGLYCERIN, omeprazole, oxyCODONE-acetaminophen, pregabalin, and tiZANidine  History Review  Allergy: Mr. Keimig is allergic to ibuprofen. Drug: Mr. Huberty  reports no history of drug use. Alcohol:  reports no history of alcohol use. Tobacco:  reports that he has been smoking cigarettes. He has a 29.00 pack-year smoking history. He has never used smokeless tobacco. Social: Mr. Cassatt  reports that he has been smoking cigarettes. He has a 29.00 pack-year smoking history. He has never used smokeless tobacco. He reports that he does not drink alcohol and does not use drugs. Medical:  has a past medical history of Arthritis, Asthma, CAD (coronary artery disease), Depression, Emphysema lung ([place]), Glaucoma, and Hypertension. Surgical: Mr. Mittag  has a past surgical history that includes heart surgery (07/1999); Hip surgery (Left, 1968); Back surgery (1985); Skin graft (1968); and Humerus fracture surgery (1968). Family: family history includes Cancer in his father; Hearing loss in his mother; Heart attack in his brother;  Heart disease in his brother; Hypertension in his father and mother; Stroke in his sister.  Laboratory Chemistry Profile   Renal Lab Results  Component Value Date   BUN 15 10/23/2017   CREATININE 0.92 48/25/0037   BCR NOT APPLICABLE 04/88/8916     Hepatic Lab Results  Component Value Date   AST 14 10/23/2017   ALT 10 10/23/2017     Electrolytes Lab Results  Component Value Date   NA 138 10/23/2017   K 4.9 10/23/2017   CL 102 10/23/2017   CALCIUM 9.5 10/23/2017     Bone No results found for: VD25OH, VD125OH2TOT, XI5038UE2, CM0349ZP9, 25OHVITD1, 25OHVITD2, 25OHVITD3, TESTOFREE, TESTOSTERONE   Inflammation (CRP: Acute Phase) (ESR: Chronic Phase) No results found for: CRP, ESRSEDRATE, LATICACIDVEN     Note: Above Lab results reviewed.  Recent Imaging Review  DG Lumbar Spine Complete W/Bend CLINICAL DATA:  75 year old male with a history of low back pain. Fall 1 week ago  EXAM: LUMBAR SPINE - COMPLETE WITH BENDING VIEWS  COMPARISON:  Plain film of the hip 02/18/2019, MR 03/05/2018  FINDINGS: Note that the numbering system on the current plain from is based on the prior MRI.  Surgical changes of lumbosacral fixation spanning L3-sacrum bilaterally. No hardware fracture identified. No significant lucency surrounding the sacral fixation.  Bony fusion of the posterior elements again noted.  No anterolisthesis/retrolisthesis.  The flexion and extension images demonstrates no pathologic motion.  Similar anterior wedge deformity/compression fracture of the T12 level when compared to the prior.  No acute displaced fracture identified.  Disc space narrowing throughout the lower thoracic and the lumbar spine with endplate changes, anterior osteophyte production. Facet hypertrophy/partial bony bridging at the L2-L3 level.  Degenerative facet changes at L1-L2.  Similar appearance of mild scoliotic curvature of the lumbar spine.  Osteopenia.  IMPRESSION: No  acute fracture or malalignment of the lumbar spine.  No pathologic motion identified on flexion/extension images.  Surgical changes spanning L3-S1 without hardware fracture.  Degenerative changes.  Electronically Signed   By: Corrie Mckusick D.O.   On: 12/26/2020 16:36 Note: Reviewed        Physical Exam  General appearance: Well nourished, well developed, and well hydrated. In no apparent acute distress Mental status: Alert, oriented x 3 (person, place, & time)       Respiratory: No evidence of acute respiratory distress Eyes: PERLA Vitals: BP 126/74   Temp 97.9 F (36.6 C) (Temporal)   Resp 18   Ht 5' 9" (1.753 m)   Wt 109  lb (49.4 kg)   SpO2 98%   BMI 16.10 kg/m  BMI: Estimated body mass index is 16.1 kg/m as calculated from the following:   Height as of this encounter: 5' 9" (1.753 m).   Weight as of this encounter: 109 lb (49.4 kg). Ideal: Male patients must weigh at least 50 kg to calculate ideal body weight  Cervical Spine Area Exam  Skin & Axial Inspection: No masses, redness, edema, swelling, or associated skin lesions Alignment: Symmetrical Functional ROM: Pain restricted ROM      Stability: No instability detected Muscle Tone/Strength: Functionally intact. No obvious neuro-muscular anomalies detected. Sensory (Neurological): Musculoskeletal pain pattern Palpation: No palpable anomalies               Thoracic Spine Area Exam  Skin & Axial Inspection: No masses, redness, or swelling Alignment: Symmetrical Functional ROM: Pain restricted ROM Stability: No instability detected Muscle Tone/Strength: Functionally intact. No obvious neuro-muscular anomalies detected. Sensory (Neurological): Musculoskeletal pain pattern Muscle strength & Tone: Complains of area being tender to palpation overlying superior medial scapular border   Lumbar Spine Area Exam  Skin & Axial Inspection: Well healed scar from prior surgery Alignment:Symmetrical Functional YIA:XKPV  restricted ROMaffecting both sides Stability:No instability detected Muscle Tone/Strength:Functionally intact. No obvious neuro-muscular anomalies detected. Sensory (Neurological):Dermatomal pain patternand musculoskeletal  Gait & Posture Assessment  Ambulation:Limited Gait:Significantly limited. Dependent on assistive device to ambulate Posture:Difficulty standing up straight, due to pain Lower Extremity Exam    Side:Right lower extremity  Side:Left lower extremity  Stability:No instability observed  Stability:No instability observed  Skin & Extremity Inspection:Skin color, temperature, and hair growth are WNL. No peripheral edema or cyanosis. No masses, redness, swelling, asymmetry, or associated skin lesions. No contractures.  Skin & Extremity Inspection:Skin color, temperature, and hair growth are WNL. No peripheral edema or cyanosis. No masses, redness, swelling, asymmetry, or associated skin lesions. No contractures.  Functional VZS:MOLMBEMLJ ROMfor hip and knee joints   Functional QGB:EEFEOFHQR ROMfor hip and knee joints   Muscle Tone/Strength:Functionally intact. No obvious neuro-muscular anomalies detected.  Muscle Tone/Strength:Functionally intact. No obvious neuro-muscular anomalies detected.  Sensory (Neurological):Musculoskeletal pain pattern  Sensory (Neurological):Musculoskeletal pain pattern  DTR: Patellar:deferred today Achilles:deferred today Plantar:deferred today  DTR: Patellar:deferred today Achilles:deferred today Plantar:deferred today  Palpation:No palpable anomalies  Palpation:No palpable anomalies       Assessment   Status Diagnosis  Having a Flare-up Having a Flare-up Persistent 1. Cervical facet joint syndrome   2. Thoracic facet joint syndrome   3. Spinal stenosis of lumbar region, unspecified whether neurogenic claudication present   4. Chronic hip pain, left    5. History of CVA (cerebrovascular accident)   6. Pain medication agreement   7. Long term current use of opiate analgesic   8. History of lumbar spinal fusion   9. Acquired right foot drop   10. Chronic pain syndrome      Updated Problems: Problem  Thoracic Facet Joint Syndrome  Cervical Facet Joint Syndrome    Plan of Care   Mr. JP EASTHAM has a current medication list which includes the following long-term medication(s): albuterol, amlodipine, atorvastatin, fluoxetine, fluoxetine, lisinopril, nitroglycerin, oxycodone-acetaminophen, [START ON 03/09/2021] oxycodone-acetaminophen, [START ON 04/08/2021] oxycodone-acetaminophen, and pregabalin.  Pharmacotherapy (Medications Ordered): Meds ordered this encounter  Medications  . oxyCODONE-acetaminophen (PERCOCET) 10-325 MG tablet    Sig: Take 1 tablet by mouth every 8 (eight) hours as needed for pain. For chronic pain syndrome    Dispense:  90 tablet    Refill:  0  . oxyCODONE-acetaminophen (PERCOCET) 10-325 MG tablet    Sig: Take 1 tablet by mouth every 8 (eight) hours as needed for pain. For chronic pain syndrome    Dispense:  90 tablet    Refill:  0  . oxyCODONE-acetaminophen (PERCOCET) 10-325 MG tablet    Sig: Take 1 tablet by mouth every 8 (eight) hours as needed for pain. For chronic pain syndrome    Dispense:  90 tablet    Refill:  0  . pregabalin (LYRICA) 75 MG capsule    Sig: Take 2 capsules (150 mg total) by mouth at bedtime as needed.    Dispense:  60 capsule    Refill:  5  . tiZANidine (ZANAFLEX) 4 MG tablet    Sig: Take 1 tablet (4 mg total) by mouth at bedtime.    Dispense:  90 tablet    Refill:  2    Do not place this medication, or any other prescription from our practice, on "Automatic Refill". Patient may have prescription filled one day early if pharmacy is closed on scheduled refill date.   Orders:  Orders Placed This Encounter  Procedures  . DG Cervical Spine With Flex & Extend    Patient presents  with axial pain with possible radicular component.  In addition to any acute findings, please report on:  1. Facet (Zygapophyseal) joint DJD (Hypertrophy, space narrowing, subchondral sclerosis, and/or osteophyte formation) 2. DDD and/or IVDD (Loss of disc height, desiccation or "Black disc disease") 3. Pars defects 4. Spondylolisthesis, spondylosis, and/or spondyloarthropathies (include Degree/Grade of displacement in mm) 5. Vertebral body Fractures, including age (old, new/acute) 62. Modic Type Changes 7. Demineralization 8. Bone pathology 9. Central, Lateral Recess, and/or Foraminal Stenosis (include AP diameter of stenosis in mm) 10. Surgical changes (hardware type, status, and presence of fibrosis) NOTE: Please specify level(s) and laterality. If applicable: Please indicate ROM and/or evidence of instability (>64m displacement between flexion and extension views)    Standing Status:   Future    Number of Occurrences:   1    Standing Expiration Date:   05/07/2021    Scheduling Instructions:     Please evaluate for any evidence of cervical spine instability. Describe the presence of any spondylolisthesis (Antero- or retrolisthesis). If present, provide displacement "Grade" and measurement in cm. Please describe presence and specific location (Level & Laterality) of any signs of      osteoarthritis, zygapophyseal (Facet) joints DJD (including decreased joint space and/or osteophytosis), DDD, Foraminal narrowing, as well as any sclerosis and/or cyst formation. Please comment on ROM.    Order Specific Question:   Reason for Exam (SYMPTOM  OR DIAGNOSIS REQUIRED)    Answer:   Cervicalgia    Order Specific Question:   Preferred imaging location?    Answer:   [facility]    Order Specific Question:   Call Results- Best Contact Number?    Answer:   [phone][facility]    Order Specific Question:   Radiology Contrast Protocol - do NOT remove file path    Answer:    _[email or web address]  . DG Thoracic Spine 2 View    Patient presents with axial pain with possible radicular component.  In addition to any acute findings, please report on:  1. Facet (Zygapophyseal) joint DJD (Hypertrophy, space narrowing, subchondral sclerosis, and/or osteophyte formation) 2. DDD and/or IVDD (Loss of disc height, desiccation or "Black disc disease") 3. Pars defects 4. Spondylolisthesis, spondylosis, and/or spondyloarthropathies (include Degree/Grade of displacement in mm) 5.  Vertebral body Fractures, including age (old, new/acute) 1. Modic Type Changes 7. Demineralization 8. Bone pathology 9. Central, Lateral Recess, and/or Foraminal Stenosis (include AP diameter of stenosis in mm) 10. Surgical changes (hardware type, status, and presence of fibrosis) NOTE: Please specify level(s) and laterality.    Standing Status:   Future    Number of Occurrences:   1    Standing Expiration Date:   02/07/2022    Order Specific Question:   Reason for Exam (SYMPTOM  OR DIAGNOSIS REQUIRED)    Answer:   Upper back pain and/or thoracic spine pain.    Order Specific Question:   Preferred imaging location?    Answer:   [facility]    Order Specific Question:   Call Results- Best Contact Number?    Answer:   [phone] (Patient Clinic facility) (Dr. Dossie Arbour)  . Drug Screen 10 W/Conf, Serum    Order Specific Question:   Release to patient    Answer:   Immediate   Follow-up plan:   Return in about 3 months (around 05/08/2021) for Medication Management, in person.   Recent Visits No visits were found meeting these conditions. Showing recent visits within past 90 days and meeting all other requirements Today's Visits Date Type Provider Dept  02/07/21 Office Visit Gillis Santa, MD [facility]  Showing today's visits and meeting all other requirements Future Appointments Date Type Provider Dept  05/01/21 Appointment Gillis Santa,  MD [facility]  Showing future appointments within next 90 days and meeting all other requirements  I discussed the assessment and treatment plan with the patient. The patient was provided an opportunity to ask questions and all were answered. The patient agreed with the plan and demonstrated an understanding of the instructions.  Patient advised to call back or seek an in-person evaluation if the symptoms or condition worsens.  Duration of encounter: 58mnutes.  Note by: BGillis Santa MD Date: 02/07/2021; Time: 2:12 PM

## 2021-02-07 NOTE — Progress Notes (Signed)
Nursing Pain Medication Assessment:  Safety precautions to be maintained throughout the outpatient stay will include: orient to surroundings, keep bed in low position, maintain call bell within reach at all times, provide assistance with transfer out of bed and ambulation.  Medication Inspection Compliance: Logan Vazquez did not comply with our request to bring his pills to be counted. He was reminded that bringing the medication bottles, even when empty, is a requirement.  Medication: None brought in. Pill/Patch Count: None available to be counted. Bottle Appearance: No container available. Did not bring bottle(s) to appointment. Filled Date: N/A Last Medication intake:  Day before yesterday

## 2021-02-20 LAB — DRUG SCREEN 10 W/CONF, SERUM
Amphetamines, IA: NEGATIVE ng/mL
Barbiturates, IA: NEGATIVE ug/mL
Benzodiazepines, IA: NEGATIVE ng/mL
Cocaine & Metabolite, IA: NEGATIVE ng/mL
Methadone, IA: NEGATIVE ng/mL
Opiates, IA: POSITIVE ng/mL — AB
Oxycodones, IA: NEGATIVE ng/mL
Phencyclidine, IA: NEGATIVE ng/mL
Propoxyphene, IA: NEGATIVE ng/mL
THC(Marijuana) Metabolite, IA: NEGATIVE ng/mL

## 2021-02-20 LAB — OPIATES,MS,WB/SP RFX
6-Acetylmorphine: NEGATIVE
Codeine: NEGATIVE ng/mL
Dihydrocodeine: 1.5 ng/mL
Hydrocodone: 16.4 ng/mL
Hydromorphone: NEGATIVE ng/mL
Morphine: NEGATIVE ng/mL
Opiate Confirmation: POSITIVE

## 2021-02-20 LAB — OXYCODONES,MS,WB/SP RFX
Oxycocone: NEGATIVE ng/mL
Oxycodones Confirmation: NEGATIVE
Oxymorphone: NEGATIVE ng/mL

## 2021-04-09 ENCOUNTER — Telehealth: Payer: Self-pay | Admitting: Student in an Organized Health Care Education/Training Program

## 2021-04-09 NOTE — Telephone Encounter (Signed)
Are you OK with this? If so, I will call the pharmacy.

## 2021-04-09 NOTE — Telephone Encounter (Signed)
Called pharmacy and notified them. Also called patient to notify him.

## 2021-04-25 ENCOUNTER — Telehealth: Payer: Self-pay | Admitting: Student in an Organized Health Care Education/Training Program

## 2021-04-25 NOTE — Telephone Encounter (Signed)
Patients sister called stating Logan Vazquez has had a stroke and is in the hospital. He will be going to rehab afterward as he cannot walk at this time. He canceled his appt and will call when he is getting out of rehab.

## 2021-05-01 ENCOUNTER — Encounter: Payer: Medicare HMO | Admitting: Student in an Organized Health Care Education/Training Program

## 2021-06-05 ENCOUNTER — Encounter: Payer: Self-pay | Admitting: Student in an Organized Health Care Education/Training Program

## 2021-06-05 ENCOUNTER — Other Ambulatory Visit: Payer: Self-pay

## 2021-06-05 ENCOUNTER — Ambulatory Visit
Payer: Medicare HMO | Attending: Student in an Organized Health Care Education/Training Program | Admitting: Student in an Organized Health Care Education/Training Program

## 2021-06-05 VITALS — BP 98/70 | HR 81 | Temp 97.2°F | Resp 16 | Ht 69.0 in | Wt 119.0 lb

## 2021-06-05 DIAGNOSIS — M47812 Spondylosis without myelopathy or radiculopathy, cervical region: Secondary | ICD-10-CM | POA: Insufficient documentation

## 2021-06-05 DIAGNOSIS — M25552 Pain in left hip: Secondary | ICD-10-CM | POA: Insufficient documentation

## 2021-06-05 DIAGNOSIS — G894 Chronic pain syndrome: Secondary | ICD-10-CM | POA: Diagnosis present

## 2021-06-05 DIAGNOSIS — M47894 Other spondylosis, thoracic region: Secondary | ICD-10-CM | POA: Diagnosis present

## 2021-06-05 DIAGNOSIS — G8929 Other chronic pain: Secondary | ICD-10-CM | POA: Diagnosis present

## 2021-06-05 DIAGNOSIS — Z0289 Encounter for other administrative examinations: Secondary | ICD-10-CM | POA: Insufficient documentation

## 2021-06-05 DIAGNOSIS — Z8673 Personal history of transient ischemic attack (TIA), and cerebral infarction without residual deficits: Secondary | ICD-10-CM | POA: Diagnosis present

## 2021-06-05 DIAGNOSIS — M48061 Spinal stenosis, lumbar region without neurogenic claudication: Secondary | ICD-10-CM | POA: Diagnosis present

## 2021-06-05 MED ORDER — OXYCODONE-ACETAMINOPHEN 10-325 MG PO TABS: 1.0000 | ORAL_TABLET | Freq: Three times a day (TID) | ORAL | 0 refills | Status: DC | PRN

## 2021-06-05 MED ORDER — PREGABALIN 75 MG PO CAPS: 150.0000 mg | ORAL_CAPSULE | Freq: Every evening | ORAL | 5 refills | Status: DC | PRN

## 2021-06-05 MED ORDER — TIZANIDINE HCL 4 MG PO TABS
4.0000 mg | ORAL_TABLET | Freq: Every day | ORAL | 2 refills | Status: DC
Start: 2021-06-05 — End: 2021-12-04

## 2021-06-05 NOTE — Progress Notes (Signed)
PROVIDER NOTE: Information contained herein reflects review and annotations entered in association with encounter. Interpretation of such information and data should be left to medically-trained personnel. Information provided to patient can be located elsewhere in the medical record under "Patient Instructions". Document created using STT-dictation technology, any transcriptional errors that may result from process are unintentional.    Patient: Logan Vazquez  Service Category: E/M  Provider: Gillis Santa, MD  DOB: [birth year]  DOS: 06/05/2021  Specialty: Interventional Pain Management  MRN: [id number]  Setting: Ambulatory outpatient  PCP: Letta Median, MD  Type: Established Patient    Referring Provider: Letta Median, MD  Location: Office  Delivery: Face-to-face     HPI  [name], a 75 y.o. year old male, is here today because of his Cervical facet joint syndrome [M47.812]. [name] primary complain today is Back Pain (lower) Last encounter: My last encounter with him was on 02/07/2021. Pertinent problems: [name] has Anxiety disorder; Atherosclerotic heart disease of native coronary artery without angina pectoris; History of lumbar spinal fusion; Lumbar degenerative disc disease; Recurrent major depressive disorder, in partial remission ([place]); Chronic pain syndrome; Long term current use of opiate analgesic; Chronic bilateral low back pain with bilateral sciatica; Chronic hip pain, left; and Spinal stenosis of lumbar region on their pertinent problem list. Pain Assessment: Severity of Chronic pain is reported as a 9 /10. Location: Back Lower/down legs bilat to feet. Onset: More than a month ago. Quality: Constant. Timing: Constant. Modifying factor(s): meds. Vitals:  height is _0  (1.753 m) and weight is 119 lb (54 kg). His temporal temperature is 97.2 F (36.2 C) (abnormal). His blood pressure is 98/70 and his pulse is 81. His respiration is 16 and oxygen saturation is  96%.   Reason for encounter: medication management.    Since the patient's last visit with me, he had a stroke for which she was in the hospital.  This presented as left leg pain and weakness.  He went to rehab and has recovered and feels that he is back at baseline now.  Otherwise no significant changes in his history.  He continues his medications as prescribed.  No issues with constipation.  Pharmacotherapy Assessment   Analgesic: Percocet 10 mg 3 times daily as needed, quantity 90/month; MME equals 45    Monitoring: [place] PMP: PDMP reviewed during this encounter.       Pharmacotherapy: No side-effects or adverse reactions reported. Compliance: No problems identified. Effectiveness: Clinically acceptable.  Rise Patience, RN  06/05/2021  2:09 PM  Sign when Signing Visit Safety precautions to be maintained throughout the outpatient stay will include: orient to surroundings, keep bed in low position, maintain call bell within reach at all times, provide assistance with transfer out of bed and ambulation.   Unable to ready label on RX bottle.  Pt. States he ran out of Oxycodone 2 days ago.   UDS:  Summary  Date Value Ref Range Status  01/05/2019 FINAL  Final    Comment:    ==================================================================== TOX[facility] SELECT 13 (MW) ==================================================================== Test                             Result       Flag       Units Drug Present and Declared for Prescription Verification   Oxycodone  1254         EXPECTED   ng/mg creat   Oxymorphone                    310          EXPECTED   ng/mg creat   Noroxycodone                   4592         EXPECTED   ng/mg creat   Noroxymorphone                 247          EXPECTED   ng/mg creat    Sources of oxycodone are scheduled prescription medications.    Oxymorphone, noroxycodone, and noroxymorphone are expected    metabolites of oxycodone.  Oxymorphone is also available as a    scheduled prescription medication. Drug Present not Declared for Prescription Verification   Alprazolam                     15           UNEXPECTED ng/mg creat   Alpha-hydroxyalprazolam        19           UNEXPECTED ng/mg creat    Source of alprazolam is a scheduled prescription medication.    Alpha-hydroxyalprazolam is an expected metabolite of alprazolam. ==================================================================== Test                      Result    Flag   Units      Ref Range   Creatinine              150              mg/dL      >=20 ==================================================================== Declared Medications:  The flagging and interpretation on this report are based on the  following declared medications.  Unexpected results may arise from  inaccuracies in the declared medications.  **Note: The testing scope of this panel includes these medications:  Oxycodone (Oxycodone Acetaminophen)  **Note: The testing scope of this panel does not include following  reported medications:  Acetaminophen (Oxycodone Acetaminophen)  Albuterol  Aspirin (Aspirin 81)  Atorvastatin  Baclofen  Celecoxib  Cholecalciferol  Cyanocobalamin  Docusate  Fluoxetine  Hydrocortisone  Lisinopril  Mupirocin  Nitroglycerin  Pregabalin ==================================================================== For clinical consultation, please call [phone]. ====================================================================      ROS  Constitutional: Denies any fever or chills Gastrointestinal: No reported hemesis, hematochezia, vomiting, or acute GI distress Musculoskeletal:  Cervical, thoracic, lumbar pain Neurological: No reported episodes of acute onset apraxia, aphasia, dysarthria, agnosia, amnesia, paralysis, loss of coordination, or loss of consciousness  Medication Review  B-12, Cholecalciferol, FLUoxetine, albuterol, amLODipine,  aspirin EC, atorvastatin, busPIRone, docusate sodium, hydrocortisone, lisinopril, loperamide, mupirocin cream, nitroGLYCERIN, omeprazole, oxyCODONE-acetaminophen, pregabalin, and tiZANidine  History Review  Allergy: [name] is allergic to ibuprofen. Drug: [name]  reports no history of drug use. Alcohol:  reports no history of alcohol use. Tobacco:  reports that he has been smoking cigarettes. He has a 29.00 pack-year smoking history. He has never used smokeless tobacco. Social: [name]  reports that he has been smoking cigarettes. He has a 29.00 pack-year smoking history. He has never used smokeless tobacco. He reports that he does not drink alcohol and does not use drugs. Medical:  has a past medical history of Arthritis, Asthma,  CAD (coronary artery disease), Depression, Emphysema lung ([place]), Glaucoma, and Hypertension. Surgical: [name]  has a past surgical history that includes heart surgery (07/1999); Hip surgery (Left, 1968); Back surgery (1985); Skin graft (1968); and Humerus fracture surgery (1968). Family: family history includes Cancer in his father; Hearing loss in his mother; Heart attack in his brother; Heart disease in his brother; Hypertension in his father and mother; Stroke in his sister.  Laboratory Chemistry Profile   Renal Lab Results  Component Value Date   BUN 15 10/23/2017   CREATININE 0.92 28/36/6294   BCR NOT APPLICABLE 76/54/6503     Hepatic Lab Results  Component Value Date   AST 14 10/23/2017   ALT 10 10/23/2017     Electrolytes Lab Results  Component Value Date   NA 138 10/23/2017   K 4.9 10/23/2017   CL 102 10/23/2017   CALCIUM 9.5 10/23/2017     Bone No results found for: VD25OH, VD125OH2TOT, [id number], NT7001VC9, 25OHVITD1, 25OHVITD2, 25OHVITD3, TESTOFREE, TESTOSTERONE   Inflammation (CRP: Acute Phase) (ESR: Chronic Phase) No results found for: CRP, ESRSEDRATE, LATICACIDVEN     Note: Above Lab results reviewed.  Recent Imaging  Review  DG Cervical Spine With Flex & Extend CLINICAL DATA:  Cervicalgia  EXAM: CERVICAL SPINE COMPLETE WITH FLEXION AND EXTENSION VIEWS  COMPARISON:  None.  FINDINGS: Frontal, bilateral oblique, lateral neutral, lateral flexion, lateral extension views of the cervical spine are obtained. Alignment is anatomic to the cervicothoracic junction. No instability with flexion or extension. Mild diffuse spondylosis with disc space narrowing most pronounced at C5-6. Diffuse facet hypertrophy. Neural foramina are grossly patent. Prevertebral soft tissues are unremarkable. Lung apices are clear.  IMPRESSION: 1. Mild diffuse spondylosis and facet hypertrophy. No acute fracture.  Electronically Signed   By: Randa Ngo M.D.   On: 02/08/2021 23:22 DG Thoracic Spine 2 View CLINICAL DATA:  Upper back pain  EXAM: THORACIC SPINE 2 VIEWS  COMPARISON:  12/26/2020  FINDINGS: Moderate severe compression deformity at approximate T10 level. Chronic compression deformity at T12. Remaining vertebral bodies demonstrate normal stature.  IMPRESSION: Moderate severe compression deformity at approximate T10 level.  Electronically Signed   By: Donavan Foil M.D.   On: 02/08/2021 23:11 Note: Reviewed        Physical Exam  General appearance: Well nourished, well developed, and well hydrated. In no apparent acute distress Mental status: Alert, oriented x 3 (person, place, & time)       Respiratory: No evidence of acute respiratory distress Eyes: PERLA Vitals: BP 98/70   Pulse 81   Temp (!) 97.2 F (36.2 C) (Temporal)   Resp 16   Ht _0  (1.753 m)   Wt 119 lb (54 kg)   SpO2 96%   BMI 17.57 kg/m  BMI: Estimated body mass index is 17.57 kg/m as calculated from the following:   Height as of this encounter: _1  (1.753 m).   Weight as of this encounter: 119 lb (54 kg). Ideal: Ideal body weight: 70.7 kg (155 lb 13.8 oz)  Cervical Spine Area Exam  Skin & Axial Inspection: No masses,  redness, edema, swelling, or associated skin lesions Alignment: Symmetrical Functional ROM: Pain restricted ROM      Stability: No instability detected Muscle Tone/Strength: Functionally intact. No obvious neuro-muscular anomalies detected. Sensory (Neurological): Musculoskeletal pain pattern Palpation: No palpable anomalies               Thoracic Spine Area Exam  Skin & Axial Inspection: No masses,  redness, or swelling Alignment: Symmetrical Functional ROM: Pain restricted ROM Stability: No instability detected Muscle Tone/Strength: Functionally intact. No obvious neuro-muscular anomalies detected. Sensory (Neurological): Musculoskeletal pain pattern Muscle strength & Tone: Complains of area being tender to palpation overlying superior medial scapular border   Lumbar Spine Area Exam  Skin & Axial Inspection:  Well healed scar from prior surgery Alignment: Symmetrical Functional ROM: Pain restricted ROM affecting both sides Stability: No instability detected Muscle Tone/Strength: Functionally intact. No obvious neuro-muscular anomalies detected. Sensory (Neurological): Dermatomal pain pattern and musculoskeletal   Gait & Posture Assessment  Ambulation: Limited Gait: Significantly limited. Dependent on assistive device to ambulate Posture: Difficulty standing up straight, due to pain  Lower Extremity Exam      Side: Right lower extremity   Side: Left lower extremity  Stability: No instability observed           Stability: No instability observed          Skin & Extremity Inspection: Skin color, temperature, and hair growth are WNL. No peripheral edema or cyanosis. No masses, redness, swelling, asymmetry, or associated skin lesions. No contractures.   Skin & Extremity Inspection: Skin color, temperature, and hair growth are WNL. No peripheral edema or cyanosis. No masses, redness, swelling, asymmetry, or associated skin lesions. No contractures.  Functional ROM: Decreased ROM for hip  and knee joints           Functional ROM: Decreased ROM for hip and knee joints          Muscle Tone/Strength: Functionally intact. No obvious neuro-muscular anomalies detected.   Muscle Tone/Strength: Functionally intact. No obvious neuro-muscular anomalies detected.  Sensory (Neurological): Musculoskeletal pain pattern         Sensory (Neurological): Musculoskeletal pain pattern        DTR: Patellar: deferred today Achilles: deferred today Plantar: deferred today   DTR: Patellar: deferred today Achilles: deferred today Plantar: deferred today  Palpation: No palpable anomalies   Palpation: No palpable anomalies    4 out of 5 strength bilateral lower extremity: Plantar flexion, dorsiflexion, knee flexion, knee extension.   Assessment   Status Diagnosis  Persistent Persistent Persistent 1. Cervical facet joint syndrome   2. Thoracic facet joint syndrome   3. Chronic hip pain, left   4. History of CVA (cerebrovascular accident)   5. Pain medication agreement   6. Chronic pain syndrome   7. Spinal stenosis of lumbar region, unspecified whether neurogenic claudication present       Plan of Care   [name] has a current medication list which includes the following long-term medication(s): albuterol, amlodipine, atorvastatin, fluoxetine, fluoxetine, lisinopril, nitroglycerin, oxycodone-acetaminophen, [START ON 07/05/2021] oxycodone-acetaminophen, [START ON 08/04/2021] oxycodone-acetaminophen, and pregabalin.  Pharmacotherapy (Medications Ordered): Meds ordered this encounter  Medications   oxyCODONE-acetaminophen (PERCOCET) 10-325 MG tablet    Sig: Take 1 tablet by mouth every 8 (eight) hours as needed for pain. For chronic pain syndrome    Dispense:  90 tablet    Refill:  0   oxyCODONE-acetaminophen (PERCOCET) 10-325 MG tablet    Sig: Take 1 tablet by mouth every 8 (eight) hours as needed for pain. For chronic pain syndrome    Dispense:  90 tablet    Refill:  0    oxyCODONE-acetaminophen (PERCOCET) 10-325 MG tablet    Sig: Take 1 tablet by mouth every 8 (eight) hours as needed for pain. For chronic pain syndrome    Dispense:  90 tablet    Refill:  0  pregabalin (LYRICA) 75 MG capsule    Sig: Take 2 capsules (150 mg total) by mouth at bedtime as needed.    Dispense:  60 capsule    Refill:  5   tiZANidine (ZANAFLEX) 4 MG tablet    Sig: Take 1 tablet (4 mg total) by mouth at bedtime.    Dispense:  90 tablet    Refill:  2    Do not place this medication, or any other prescription from our practice, on "Automatic Refill". Patient may have prescription filled one day early if pharmacy is closed on scheduled refill date.    Follow-up plan:   Return in about 3 months (around 09/05/2021) for Medication Management, in person.   Recent Visits No visits were found meeting these conditions. Showing recent visits within past 90 days and meeting all other requirements Today's Visits Date Type Provider Dept  06/05/21 Office Visit Gillis Santa, MD [facility]  Showing today's visits and meeting all other requirements Future Appointments No visits were found meeting these conditions. Showing future appointments within next 90 days and meeting all other requirements I discussed the assessment and treatment plan with the patient. The patient was provided an opportunity to ask questions and all were answered. The patient agreed with the plan and demonstrated an understanding of the instructions.  Patient advised to call back or seek an in-person evaluation if the symptoms or condition worsens.  Duration of encounter: 22mnutes.  Note by: BGillis Santa MD Date: 06/05/2021; Time: 2:58 PM

## 2021-06-05 NOTE — Progress Notes (Signed)
Safety precautions to be maintained throughout the outpatient stay will include: orient to surroundings, keep bed in low position, maintain call bell within reach at all times, provide assistance with transfer out of bed and ambulation.   Unable to ready label on RX bottle.  Pt. States he ran out of Oxycodone 2 days ago.

## 2021-09-04 ENCOUNTER — Ambulatory Visit
Payer: Medicare HMO | Attending: Student in an Organized Health Care Education/Training Program | Admitting: Student in an Organized Health Care Education/Training Program

## 2021-09-04 ENCOUNTER — Encounter: Payer: Self-pay | Admitting: Student in an Organized Health Care Education/Training Program

## 2021-09-04 ENCOUNTER — Other Ambulatory Visit: Payer: Self-pay

## 2021-09-04 VITALS — BP 122/70 | HR 78 | Temp 97.2°F | Resp 18 | Ht 69.0 in | Wt 109.0 lb

## 2021-09-04 DIAGNOSIS — Z79891 Long term (current) use of opiate analgesic: Secondary | ICD-10-CM

## 2021-09-04 DIAGNOSIS — G8929 Other chronic pain: Secondary | ICD-10-CM | POA: Diagnosis present

## 2021-09-04 DIAGNOSIS — M47894 Other spondylosis, thoracic region: Secondary | ICD-10-CM

## 2021-09-04 DIAGNOSIS — M48061 Spinal stenosis, lumbar region without neurogenic claudication: Secondary | ICD-10-CM

## 2021-09-04 DIAGNOSIS — G894 Chronic pain syndrome: Secondary | ICD-10-CM

## 2021-09-04 DIAGNOSIS — M25552 Pain in left hip: Secondary | ICD-10-CM | POA: Insufficient documentation

## 2021-09-04 DIAGNOSIS — Z8673 Personal history of transient ischemic attack (TIA), and cerebral infarction without residual deficits: Secondary | ICD-10-CM | POA: Diagnosis present

## 2021-09-04 DIAGNOSIS — M47812 Spondylosis without myelopathy or radiculopathy, cervical region: Secondary | ICD-10-CM

## 2021-09-04 DIAGNOSIS — M5416 Radiculopathy, lumbar region: Secondary | ICD-10-CM | POA: Diagnosis present

## 2021-09-04 DIAGNOSIS — Z0289 Encounter for other administrative examinations: Secondary | ICD-10-CM | POA: Diagnosis present

## 2021-09-04 MED ORDER — KETOROLAC TROMETHAMINE 30 MG/ML IJ SOLN
INTRAMUSCULAR | Status: AC
  Filled 2021-09-04: qty 1

## 2021-09-04 MED ORDER — OXYCODONE-ACETAMINOPHEN 10-325 MG PO TABS: 1.0000 | ORAL_TABLET | Freq: Three times a day (TID) | ORAL | 0 refills | Status: DC | PRN

## 2021-09-04 MED ORDER — ORPHENADRINE CITRATE 30 MG/ML IJ SOLN
30.0000 mg | Freq: Once | INTRAMUSCULAR | Status: AC
  Administered 2021-09-04: 30 mg via INTRAMUSCULAR

## 2021-09-04 MED ORDER — KETOROLAC TROMETHAMINE 30 MG/ML IJ SOLN
30.0000 mg | Freq: Once | INTRAMUSCULAR | Status: AC
  Administered 2021-09-04: 30 mg via INTRAMUSCULAR

## 2021-09-04 MED ORDER — ORPHENADRINE CITRATE 30 MG/ML IJ SOLN
INTRAMUSCULAR | Status: AC
  Filled 2021-09-04: qty 2

## 2021-09-04 NOTE — Patient Instructions (Signed)

## 2021-09-04 NOTE — Progress Notes (Signed)
Nursing Pain Medication Assessment:  Safety precautions to be maintained throughout the outpatient stay will include: orient to surroundings, keep bed in low position, maintain call bell within reach at all times, provide assistance with transfer out of bed and ambulation.  Medication Inspection Compliance: Pill count conducted under aseptic conditions, in front of the patient. Neither the pills nor the bottle was removed from the patient's sight at any time. Once count was completed pills were immediately returned to the patient in their original bottle.  Medication: Oxycodone/APAP Pill/Patch Count:  0 of 90 pills remain Pill/Patch Appearance: Markings consistent with prescribed medication Bottle Appearance: Standard pharmacy container. Clearly labeled. Filled Date: 08 / 20 / 2022 Last Medication intake:  Ran out of medicine more than 48 hours ago

## 2021-09-04 NOTE — Progress Notes (Signed)
PROVIDER NOTE: Information contained herein reflects review and annotations entered in association with encounter. Interpretation of such information and data should be left to medically-trained personnel. Information provided to patient can be located elsewhere in the medical record under "Patient Instructions". Document created using STT-dictation technology, any transcriptional errors that may result from process are unintentional.    Patient: Logan Vazquez  Service Category: E/M  Provider: Gillis Santa, MD  DOB: [birth year]  DOS: 09/04/2021  Specialty: Interventional Pain Management  MRN: [id number]  Setting: Ambulatory outpatient  PCP: Letta Median, MD  Type: Established Patient    Referring Provider: Letta Median, MD  Location: Office  Delivery: Face-to-face     HPI  [name], a 75 y.o. year old male, is here today because of his Cervical facet joint syndrome [M47.812]. Logan Vazquez primary complain today is Back Pain (lower) Last encounter: My last encounter with him was on 06/05/2021. Pertinent problems: Logan Vazquez has Anxiety disorder; Atherosclerotic heart disease of native coronary artery without angina pectoris; History of lumbar spinal fusion; Lumbar degenerative disc disease; Recurrent major depressive disorder, in partial remission ([place]); Chronic pain syndrome; Long term current use of opiate analgesic; Chronic bilateral low back pain with bilateral sciatica; Chronic hip pain, left; and Spinal stenosis of lumbar region on their pertinent problem list. Pain Assessment: Severity of Chronic pain is reported as a 10-Worst pain ever/10. Location: Back Lower/both legs, worse on right leg. Onset:  . Quality: Other (Comment) (aweful). Timing: Constant. Modifying factor(s): nothing. Vitals:  height is _0  (1.753 m) and weight is 109 lb (49.4 kg). His temporal temperature is 97.2 F (36.2 C) (abnormal). His blood pressure is 122/70 and his pulse is 78. His respiration is 18  and oxygen saturation is 98%.   Reason for encounter: medication management.    Patient is having a lumbar radicular pain flare down his right lower extremity stopping at his posterior lateral calf.  He went to the emergency department for this.  He states that nothing was done.  Of note patient has L3-S1 spinal fusion.  For his sciatica pain flare, we discussed caudal epidural steroid injection given multi level lumbar foraminal stenosis.  Risks and benefits reviewed and patient would like to proceed.  Given his acute on chronic pain flare, will administer intramuscular Norflex and Toradol 30 mg, 30 mg respectively.  Will refill his Percocet as below.  No change in dose.  No issues with constipation or chronic nausea.  Patient was unable to provide urine for his previous urine toxicology screen.  We will obtain serum tox screen today for medication compliance and monitoring.   Pharmacotherapy Assessment  Analgesic: Percocet 10 mg 3 times daily as needed, quantity 90/month; MME equals 45    Monitoring: [place] PMP: PDMP reviewed during this encounter.       Pharmacotherapy: No side-effects or adverse reactions reported. Compliance: No problems identified. Effectiveness: Clinically acceptable.  UDS:  Summary  Date Value Ref Range Status  01/05/2019 FINAL  Final    Comment:    ==================================================================== TOXASSURE SELECT 13 (MW) ==================================================================== Test                             Result       Flag       Units Drug Present and Declared for Prescription Verification   Oxycodone  1254         EXPECTED   ng/mg creat   Oxymorphone                    310          EXPECTED   ng/mg creat   Noroxycodone                   4592         EXPECTED   ng/mg creat   Noroxymorphone                 247          EXPECTED   ng/mg creat    Sources of oxycodone are scheduled prescription medications.     Oxymorphone, noroxycodone, and noroxymorphone are expected    metabolites of oxycodone. Oxymorphone is also available as a    scheduled prescription medication. Drug Present not Declared for Prescription Verification   Alprazolam                     15           UNEXPECTED ng/mg creat   Alpha-hydroxyalprazolam        19           UNEXPECTED ng/mg creat    Source of alprazolam is a scheduled prescription medication.    Alpha-hydroxyalprazolam is an expected metabolite of alprazolam. ==================================================================== Test                      Result    Flag   Units      Ref Range   Creatinine              150              mg/dL      >=20 ==================================================================== Declared Medications:  The flagging and interpretation on this report are based on the  following declared medications.  Unexpected results may arise from  inaccuracies in the declared medications.  **Note: The testing scope of this panel includes these medications:  Oxycodone (Oxycodone Acetaminophen)  **Note: The testing scope of this panel does not include following  reported medications:  Acetaminophen (Oxycodone Acetaminophen)  Albuterol  Aspirin (Aspirin 81)  Atorvastatin  Baclofen  Celecoxib  Cholecalciferol  Cyanocobalamin  Docusate  Fluoxetine  Hydrocortisone  Lisinopril  Mupirocin  Nitroglycerin  Pregabalin ==================================================================== For clinical consultation, please call [phone]. ====================================================================       ROS  Constitutional: Denies any fever or chills Gastrointestinal: No reported hemesis, hematochezia, vomiting, or acute GI distress Musculoskeletal:  Cervical, thoracic, lumbar pain, with radiation down to right posterior lateral calf Neurological: No reported episodes of acute onset apraxia, aphasia, dysarthria, agnosia,  amnesia, paralysis, loss of coordination, or loss of consciousness  Medication Review  B-12, Cholecalciferol, FLUoxetine, albuterol, amLODipine, aspirin EC, atorvastatin, busPIRone, docusate sodium, hydrocortisone, lisinopril, loperamide, mupirocin cream, nitroGLYCERIN, omeprazole, oxyCODONE-acetaminophen, pregabalin, and tiZANidine  History Review  Allergy: Logan Vazquez is allergic to ibuprofen. Drug: Logan Vazquez  reports no history of drug use. Alcohol:  reports no history of alcohol use. Tobacco:  reports that he has been smoking cigarettes. He has a 29.00 pack-year smoking history. He has never used smokeless tobacco. Social: Logan Vazquez  reports that he has been smoking cigarettes. He has a 29.00 pack-year smoking history. He has never used smokeless tobacco. He reports that he does not drink alcohol and does not use drugs. Medical:  has a past medical history of Arthritis, Asthma, CAD (coronary artery disease), Depression, Emphysema lung ([place]), Glaucoma, and Hypertension. Surgical: Logan Vazquez  has a past surgical history that includes heart surgery (07/1999); Hip surgery (Left, 1968); Back surgery (1985); Skin graft (1968); and Humerus fracture surgery (1968). Family: family history includes Cancer in his father; Hearing loss in his mother; Heart attack in his brother; Heart disease in his brother; Hypertension in his father and mother; Stroke in his sister.  Laboratory Chemistry Profile   Renal Lab Results  Component Value Date   BUN 15 10/23/2017   CREATININE 0.92 50/53/9767   BCR NOT APPLICABLE 34/19/3790     Hepatic Lab Results  Component Value Date   AST 14 10/23/2017   ALT 10 10/23/2017     Electrolytes Lab Results  Component Value Date   NA 138 10/23/2017   K 4.9 10/23/2017   CL 102 10/23/2017   CALCIUM 9.5 10/23/2017     Bone No results found for: VD25OH, VD125OH2TOT, WI0973ZH2, DJ2426ST4, 25OHVITD1, 25OHVITD2, 25OHVITD3, TESTOFREE, TESTOSTERONE   Inflammation  (CRP: Acute Phase) (ESR: Chronic Phase) No results found for: CRP, ESRSEDRATE, LATICACIDVEN     Note: Above Lab results reviewed.  Recent Imaging Review  DG Cervical Spine With Flex & Extend CLINICAL DATA:  Cervicalgia  EXAM: CERVICAL SPINE COMPLETE WITH FLEXION AND EXTENSION VIEWS  COMPARISON:  None.  FINDINGS: Frontal, bilateral oblique, lateral neutral, lateral flexion, lateral extension views of the cervical spine are obtained. Alignment is anatomic to the cervicothoracic junction. No instability with flexion or extension. Mild diffuse spondylosis with disc space narrowing most pronounced at C5-6. Diffuse facet hypertrophy. Neural foramina are grossly patent. Prevertebral soft tissues are unremarkable. Lung apices are clear.  IMPRESSION: 1. Mild diffuse spondylosis and facet hypertrophy. No acute fracture.  Electronically Signed   By: Randa Ngo M.D.   On: 02/08/2021 23:22 DG Thoracic Spine 2 View CLINICAL DATA:  Upper back pain  EXAM: THORACIC SPINE 2 VIEWS  COMPARISON:  12/26/2020  FINDINGS: Moderate severe compression deformity at approximate T10 level. Chronic compression deformity at T12. Remaining vertebral bodies demonstrate normal stature.  IMPRESSION: Moderate severe compression deformity at approximate T10 level.  Electronically Signed   By: Donavan Foil M.D.   On: 02/08/2021 23:11 Note: Reviewed        Physical Exam  General appearance: Well nourished, well developed, and well hydrated. In no apparent acute distress Mental status: Alert, oriented x 3 (person, place, & time)       Respiratory: No evidence of acute respiratory distress Eyes: PERLA Vitals: BP 122/70 (BP Location: Right Arm, Patient Position: Sitting, Cuff Size: Normal)   Pulse 78   Temp (!) 97.2 F (36.2 C) (Temporal)   Resp 18   Ht _0  (1.753 m)   Wt 109 lb (49.4 kg)   SpO2 98%   BMI 16.10 kg/m  BMI: Estimated body mass index is 16.1 kg/m as calculated from the  following:   Height as of this encounter: _1  (1.753 m).   Weight as of this encounter: 109 lb (49.4 kg). Ideal: Male patients must weigh at least 50 kg to calculate ideal body weight  Cervical Spine Area Exam  Skin & Axial Inspection: No masses, redness, edema, swelling, or associated skin lesions Alignment: Symmetrical Functional ROM: Pain restricted ROM      Stability: No instability detected Muscle Tone/Strength: Functionally intact. No obvious neuro-muscular anomalies detected. Sensory (Neurological): Musculoskeletal pain pattern Palpation: No palpable anomalies  Thoracic Spine Area Exam  Skin & Axial Inspection: No masses, redness, or swelling Alignment: Symmetrical Functional ROM: Pain restricted ROM Stability: No instability detected Muscle Tone/Strength: Functionally intact. No obvious neuro-muscular anomalies detected. Sensory (Neurological): Musculoskeletal pain pattern Muscle strength & Tone: Complains of area being tender to palpation overlying superior medial scapular border   Lumbar Spine Area Exam  Skin & Axial Inspection:  Well healed scar from prior surgery Alignment: Symmetrical Functional ROM: Pain restricted ROM affecting both sides Stability: No instability detected Muscle Tone/Strength: Functionally intact. No obvious neuro-muscular anomalies detected. Sensory (Neurological): Dermatomal pain pattern on the right   Gait & Posture Assessment  Ambulation: Limited Gait: Significantly limited. Dependent on assistive device to ambulate Posture: Difficulty standing up straight, due to pain  Lower Extremity Exam      Side: Right lower extremity   Side: Left lower extremity  Stability: No instability observed           Stability: No instability observed          Skin & Extremity Inspection: Skin color, temperature, and hair growth are WNL. No peripheral edema or cyanosis. No masses, redness, swelling, asymmetry, or associated skin lesions. No  contractures.   Skin & Extremity Inspection: Skin color, temperature, and hair growth are WNL. No peripheral edema or cyanosis. No masses, redness, swelling, asymmetry, or associated skin lesions. No contractures.  Functional ROM: Decreased ROM for hip and knee joints           Functional ROM: Decreased ROM for hip and knee joints          Muscle Tone/Strength: Functionally intact. No obvious neuro-muscular anomalies detected.   Muscle Tone/Strength: Functionally intact. No obvious neuro-muscular anomalies detected.  Sensory (Neurological): Dermatomal pain pattern on the right       Sensory (Neurological): Musculoskeletal pain pattern        DTR: Patellar: deferred today Achilles: deferred today Plantar: deferred today   DTR: Patellar: deferred today Achilles: deferred today Plantar: deferred today  Palpation: No palpable anomalies   Palpation: No palpable anomalies    4 out of 5 strength bilateral lower extremity: Plantar flexion, dorsiflexion, knee flexion, knee extension.   Assessment   Status Diagnosis  Persistent Persistent Persistent 1. Cervical facet joint syndrome   2. Thoracic facet joint syndrome   3. Chronic hip pain, left   4. History of CVA (cerebrovascular accident)   5. Pain medication agreement   6. Spinal stenosis of lumbar region, unspecified whether neurogenic claudication present   7. Long term current use of opiate analgesic   8. Chronic pain syndrome   9. Chronic radicular lumbar pain        Plan of Care   Logan Vazquez has a current medication list which includes the following long-term medication(s): albuterol, amlodipine, atorvastatin, fluoxetine, fluoxetine, lisinopril, nitroglycerin, pregabalin, oxycodone-acetaminophen, [START ON 10/04/2021] oxycodone-acetaminophen, and [START ON 11/03/2021] oxycodone-acetaminophen.  1. Cervical facet joint syndrome - oxyCODONE-acetaminophen (PERCOCET) 10-325 MG tablet; Take 1 tablet by mouth every 8 (eight)  hours as needed for pain. For chronic pain syndrome  Dispense: 90 tablet; Refill: 0 - oxyCODONE-acetaminophen (PERCOCET) 10-325 MG tablet; Take 1 tablet by mouth every 8 (eight) hours as needed for pain. For chronic pain syndrome  Dispense: 90 tablet; Refill: 0 - oxyCODONE-acetaminophen (PERCOCET) 10-325 MG tablet; Take 1 tablet by mouth every 8 (eight) hours as needed for pain. For chronic pain syndrome  Dispense: 90 tablet; Refill: 0  2. Thoracic facet joint syndrome - oxyCODONE-acetaminophen (PERCOCET)  10-325 MG tablet; Take 1 tablet by mouth every 8 (eight) hours as needed for pain. For chronic pain syndrome  Dispense: 90 tablet; Refill: 0 - oxyCODONE-acetaminophen (PERCOCET) 10-325 MG tablet; Take 1 tablet by mouth every 8 (eight) hours as needed for pain. For chronic pain syndrome  Dispense: 90 tablet; Refill: 0 - oxyCODONE-acetaminophen (PERCOCET) 10-325 MG tablet; Take 1 tablet by mouth every 8 (eight) hours as needed for pain. For chronic pain syndrome  Dispense: 90 tablet; Refill: 0  3. Chronic hip pain, left  4. History of CVA (cerebrovascular accident)  5. Pain medication agreement  6. Spinal stenosis of lumbar region, unspecified whether neurogenic claudication present - oxyCODONE-acetaminophen (PERCOCET) 10-325 MG tablet; Take 1 tablet by mouth every 8 (eight) hours as needed for pain. For chronic pain syndrome  Dispense: 90 tablet; Refill: 0 - oxyCODONE-acetaminophen (PERCOCET) 10-325 MG tablet; Take 1 tablet by mouth every 8 (eight) hours as needed for pain. For chronic pain syndrome  Dispense: 90 tablet; Refill: 0 - oxyCODONE-acetaminophen (PERCOCET) 10-325 MG tablet; Take 1 tablet by mouth every 8 (eight) hours as needed for pain. For chronic pain syndrome  Dispense: 90 tablet; Refill: 0 - Caudal Epidural Injection; Future  7. Long term current use of opiate analgesic  8. Chronic pain syndrome - oxyCODONE-acetaminophen (PERCOCET) 10-325 MG tablet; Take 1 tablet by mouth  every 8 (eight) hours as needed for pain. For chronic pain syndrome  Dispense: 90 tablet; Refill: 0 - oxyCODONE-acetaminophen (PERCOCET) 10-325 MG tablet; Take 1 tablet by mouth every 8 (eight) hours as needed for pain. For chronic pain syndrome  Dispense: 90 tablet; Refill: 0 - oxyCODONE-acetaminophen (PERCOCET) 10-325 MG tablet; Take 1 tablet by mouth every 8 (eight) hours as needed for pain. For chronic pain syndrome  Dispense: 90 tablet; Refill: 0 - Caudal Epidural Injection; Future - Drug Screen 10 W/Conf, Serum  9. Chronic radicular lumbar pain - Caudal Epidural Injection; Future - ketorolac (TORADOL) 30 MG/ML injection 30 mg - orphenadrine (NORFLEX) injection 30 mg   Pharmacotherapy (Medications Ordered): Meds ordered this encounter  Medications   oxyCODONE-acetaminophen (PERCOCET) 10-325 MG tablet    Sig: Take 1 tablet by mouth every 8 (eight) hours as needed for pain. For chronic pain syndrome    Dispense:  90 tablet    Refill:  0   oxyCODONE-acetaminophen (PERCOCET) 10-325 MG tablet    Sig: Take 1 tablet by mouth every 8 (eight) hours as needed for pain. For chronic pain syndrome    Dispense:  90 tablet    Refill:  0   oxyCODONE-acetaminophen (PERCOCET) 10-325 MG tablet    Sig: Take 1 tablet by mouth every 8 (eight) hours as needed for pain. For chronic pain syndrome    Dispense:  90 tablet    Refill:  0   ketorolac (TORADOL) 30 MG/ML injection 30 mg   orphenadrine (NORFLEX) injection 30 mg   Orders Placed This Encounter  Procedures   Caudal Epidural Injection    Standing Status:   Future    Standing Expiration Date:   10/04/2021    Scheduling Instructions:     Laterality: Midline     Level(s): Sacrococcygeal canal (Tailbone area)     Sedation: PO Valium     Scheduling Timeframe: As soon as pre-approved    Order Specific Question:   Where will this procedure be performed?    Answer:   [facility]   Drug Screen 10 W/Conf, Serum    Order Specific Question:  Release to patient    Answer:   Immediate    Follow-up plan:   Return in about 2 weeks (around 09/18/2021) for Caudal ESI  , minimal sedation (PO Valium).   Recent Visits No visits were found meeting these conditions. Showing recent visits within past 90 days and meeting all other requirements Today's Visits Date Type Provider Dept  09/04/21 Office Visit Gillis Santa, MD [facility]  Showing today's visits and meeting all other requirements Future Appointments No visits were found meeting these conditions. Showing future appointments within next 90 days and meeting all other requirements I discussed the assessment and treatment plan with the patient. The patient was provided an opportunity to ask questions and all were answered. The patient agreed with the plan and demonstrated an understanding of the instructions.  Patient advised to call back or seek an in-person evaluation if the symptoms or condition worsens.  Duration of encounter: 89mnutes.  Note by: BGillis Santa MD Date: 09/04/2021; Time: 2:57 PM

## 2021-09-19 ENCOUNTER — Encounter: Payer: Self-pay | Admitting: Student in an Organized Health Care Education/Training Program

## 2021-09-19 ENCOUNTER — Ambulatory Visit
Admission: RE | Admit: 2021-09-19 | Discharge: 2021-09-19 | Disposition: A | Discharge status: Home / Self Care | Payer: Medicare HMO | Source: Ambulatory Visit | Attending: Student in an Organized Health Care Education/Training Program | Admitting: Student in an Organized Health Care Education/Training Program

## 2021-09-19 ENCOUNTER — Ambulatory Visit (HOSPITAL_BASED_OUTPATIENT_CLINIC_OR_DEPARTMENT_OTHER): Payer: Medicare HMO | Admitting: Student in an Organized Health Care Education/Training Program

## 2021-09-19 ENCOUNTER — Other Ambulatory Visit: Payer: Self-pay

## 2021-09-19 DIAGNOSIS — G894 Chronic pain syndrome: Secondary | ICD-10-CM | POA: Diagnosis not present

## 2021-09-19 DIAGNOSIS — M5136 Other intervertebral disc degeneration, lumbar region: Secondary | ICD-10-CM | POA: Diagnosis present

## 2021-09-19 DIAGNOSIS — M48061 Spinal stenosis, lumbar region without neurogenic claudication: Secondary | ICD-10-CM

## 2021-09-19 DIAGNOSIS — G8929 Other chronic pain: Secondary | ICD-10-CM | POA: Diagnosis not present

## 2021-09-19 DIAGNOSIS — M5416 Radiculopathy, lumbar region: Secondary | ICD-10-CM | POA: Insufficient documentation

## 2021-09-19 LAB — DRUG SCREEN 10 W/CONF, SERUM
Amphetamines, IA: NEGATIVE ng/mL
Barbiturates, IA: NEGATIVE ug/mL
Benzodiazepines, IA: NEGATIVE ng/mL
Cocaine & Metabolite, IA: NEGATIVE ng/mL
Methadone, IA: NEGATIVE ng/mL
Opiates, IA: NEGATIVE ng/mL
Oxycodones, IA: NEGATIVE ng/mL
Phencyclidine, IA: NEGATIVE ng/mL
Propoxyphene, IA: NEGATIVE ng/mL
THC(Marijuana) Metabolite, IA: NEGATIVE ng/mL

## 2021-09-19 LAB — OXYCODONES,MS,WB/SP RFX
Oxycocone: NEGATIVE ng/mL
Oxycodones Confirmation: NEGATIVE
Oxymorphone: NEGATIVE ng/mL

## 2021-09-19 MED ORDER — DIAZEPAM 5 MG PO TABS
5.0000 mg | ORAL_TABLET | ORAL | Status: AC
  Administered 2021-09-19: 5 mg via ORAL

## 2021-09-19 MED ORDER — LIDOCAINE HCL 2 % IJ SOLN
INTRAMUSCULAR | Status: AC
  Filled 2021-09-19: qty 20

## 2021-09-19 MED ORDER — DEXAMETHASONE SODIUM PHOSPHATE 10 MG/ML IJ SOLN
INTRAMUSCULAR | Status: AC
  Filled 2021-09-19: qty 1

## 2021-09-19 MED ORDER — SODIUM CHLORIDE (PF) 0.9 % IJ SOLN
INTRAMUSCULAR | Status: AC
  Filled 2021-09-19: qty 10

## 2021-09-19 MED ORDER — DEXAMETHASONE SODIUM PHOSPHATE 10 MG/ML IJ SOLN
10.0000 mg | Freq: Once | INTRAMUSCULAR | Status: AC
  Administered 2021-09-19: 10 mg

## 2021-09-19 MED ORDER — DIAZEPAM 5 MG PO TABS
ORAL_TABLET | ORAL | Status: AC
  Filled 2021-09-19: qty 1

## 2021-09-19 MED ORDER — IOHEXOL 180 MG/ML  SOLN
10.0000 mL | Freq: Once | INTRAMUSCULAR | Status: AC
  Administered 2021-09-19: 5 mL via EPIDURAL

## 2021-09-19 MED ORDER — SODIUM CHLORIDE 0.9% FLUSH
2.0000 mL | Freq: Once | INTRAVENOUS | Status: AC
  Administered 2021-09-19: 2 mL

## 2021-09-19 MED ORDER — ROPIVACAINE HCL 2 MG/ML IJ SOLN
2.0000 mL | Freq: Once | INTRAMUSCULAR | Status: AC
  Administered 2021-09-19: 2 mL via EPIDURAL

## 2021-09-19 MED ORDER — LIDOCAINE HCL 2 % IJ SOLN
20.0000 mL | Freq: Once | INTRAMUSCULAR | Status: AC
  Administered 2021-09-19: 400 mg

## 2021-09-19 NOTE — Patient Instructions (Signed)
Pain Management Discharge Instructions  General Discharge Instructions :  If you need to reach your doctor call: Monday-Friday 8:00 am - 4:00 pm at [phone] or toll free [phone].  After clinic hours [phone] to have operator reach doctor.  Bring all of your medication bottles to all your appointments in the pain clinic.  To cancel or reschedule your appointment with Pain Management please remember to call 24 hours in advance to avoid a fee.  Refer to the educational materials which you have been given on: General Risks, I had my Procedure. Discharge Instructions, Post Sedation.  Post Procedure Instructions:  The drugs you were given will stay in your system until tomorrow, so for the next 24 hours you should not drive, make any legal decisions or drink any alcoholic beverages.  You may eat anything you prefer, but it is better to start with liquids then soups and crackers, and gradually work up to solid foods.  Please notify your doctor immediately if you have any unusual bleeding, trouble breathing or pain that is not related to your normal pain.  Depending on the type of procedure that was done, some parts of your body may feel week and/or numb.  This usually clears up by tonight or the next day.  Walk with the use of an assistive device or accompanied by an adult for the 24 hours.  You may use ice on the affected area for the first 24 hours.  Put ice in a Ziploc bag and cover with a towel and place against area 15 minutes on 15 minutes off.  You may switch to heat after 24 hours.Epidural Steroid Injection Patient Information  Description: The epidural space surrounds the nerves as they exit the spinal cord.  In some patients, the nerves can be compressed and inflamed by a bulging disc or a tight spinal canal (spinal stenosis).  By injecting steroids into the epidural space, we can bring irritated nerves into direct contact with a potentially helpful medication.  These  steroids act directly on the irritated nerves and can reduce swelling and inflammation which often leads to decreased pain.  Epidural steroids may be injected anywhere along the spine and from the neck to the low back depending upon the location of your pain.   After numbing the skin with local anesthetic (like Novocaine), a small needle is passed into the epidural space slowly.  You may experience a sensation of pressure while this is being done.  The entire block usually last less than 10 minutes.  Conditions which may be treated by epidural steroids:  Low back and leg pain Neck and arm pain Spinal stenosis Post-laminectomy syndrome Herpes zoster (shingles) pain Pain from compression fractures  Preparation for the injection:  Do not eat any solid food or dairy products within 8 hours of your appointment.  You may drink clear liquids up to 3 hours before appointment.  Clear liquids include water, black coffee, juice or soda.  No milk or cream please. You may take your regular medication, including pain medications, with a sip of water before your appointment  Diabetics should hold regular insulin (if taken separately) and take 1/2 normal NPH dos the morning of the procedure.  Carry some sugar containing items with you to your appointment. A driver must accompany you and be prepared to drive you home after your procedure.  Bring all your current medications with your. An IV may be inserted and sedation may be given at the discretion of the physician.     A blood pressure cuff, EKG and other monitors will often be applied during the procedure.  Some patients may need to have extra oxygen administered for a short period. You will be asked to provide medical information, including your allergies, prior to the procedure.  We must know immediately if you are taking blood thinners (like Coumadin/Warfarin)  Or if you are allergic to IV iodine contrast (dye). We must know if you could possible be  pregnant.  Possible side-effects: Bleeding from needle site Infection (rare, may require surgery) Nerve injury (rare) Numbness & tingling (temporary) Difficulty urinating (rare, temporary) Spinal headache ( a headache worse with upright posture) Light -headedness (temporary) Pain at injection site (several days) Decreased blood pressure (temporary) Weakness in arm/leg (temporary) Pressure sensation in back/neck (temporary)  Call if you experience: Fever/chills associated with headache or increased back/neck pain. Headache worsened by an upright position. New onset weakness or numbness of an extremity below the injection site Hives or difficulty breathing (go to the emergency room) Inflammation or drainage at the infection site Severe back/neck pain Any new symptoms which are concerning to you  Please note:  Although the local anesthetic injected can often make your back or neck feel good for several hours after the injection, the pain will likely return.  It takes 3-7 days for steroids to work in the epidural space.  You may not notice any pain relief for at least that one week.  If effective, we will often do a series of three injections spaced 3-6 weeks apart to maximally decrease your pain.  After the initial series, we generally will wait several months before considering a repeat injection of the same type.  If you have any questions, please call [phone]  [facility] 

## 2021-09-19 NOTE — Progress Notes (Signed)
Safety precautions to be maintained throughout the outpatient stay will include: orient to surroundings, keep bed in low position, maintain call bell within reach at all times, provide assistance with transfer out of bed and ambulation.  

## 2021-09-19 NOTE — Progress Notes (Signed)
PROVIDER NOTE: Information contained herein reflects review and annotations entered in association with encounter. Interpretation of such information and data should be left to medically-trained personnel. Information provided to patient can be located elsewhere in the medical record under "Patient Instructions". Document created using STT-dictation technology, any transcriptional errors that may result from process are unintentional.    Patient: Logan Vazquez  Service Category: Procedure  Provider: Edward Jolly, MD  DOB: 03/20/1946  DOS: 09/19/2021  Location: [facility]  MRN: [id number]  Setting: Ambulatory - outpatient  Referring Provider: Edward Jolly, MD  Type: Established Patient  Specialty: Interventional Pain Management  PCP: Oswaldo Conroy, MD   Primary Reason for Visit: Interventional Pain Management Treatment. CC: Back Pain (lower)    Procedure:          Anesthesia, Analgesia, Anxiolysis:  Type: Diagnostic Epidural Steroid Injection          Region: Caudal Level: Sacrococcygeal   Laterality: Midline       Type: Local Anesthesia Local Anesthetic: Lidocaine 1-2% Sedation: Minimal Anxiolysis  Indication(s): Anxiety & Analgesia Route: Infiltration ([place]/IM)    Position: Prone   Indications: 1. Spinal stenosis of lumbar region, unspecified whether neurogenic claudication present   2. Chronic pain syndrome   3. Chronic radicular lumbar pain   4. Other intervertebral disc degeneration, lumbar region    Pain Score: Pre-procedure: 9 /10 Post-procedure: 8 /10     Pre-op H&P Assessment:  Mr. Hausen is a 75 y.o. (year old), male patient, seen today for interventional treatment. He  has a past surgical history that includes heart surgery (07/1999); Hip surgery (Left, 1968); Back surgery (1985); Skin graft (1968); and Humerus fracture surgery (1968). Mr. Monds has a current medication list which includes the following prescription(s): albuterol, amlodipine,  aspirin ec, atorvastatin, buspirone, b-12, docusate sodium, fluoxetine, fluoxetine, hydrocortisone, lisinopril, loperamide, mupirocin cream, nitroglycerin, omeprazole, oxycodone-acetaminophen, [START ON 10/04/2021] oxycodone-acetaminophen, [START ON 11/03/2021] oxycodone-acetaminophen, pregabalin, tizanidine, cholecalciferol, and hydrocortisone. His primarily concern today is the Back Pain (lower)  Initial Vital Signs:  Pulse/HCG Rate: 74ECG Heart Rate: 79 Temp:  (!) 97.2 F (36.2 C) Resp: 18 BP:  (!) 103/56 SpO2: 94 %  BMI: Estimated body mass index is 16.1 kg/m as calculated from the following:   Height as of this encounter: 5\' 9"  (1.753 m).   Weight as of this encounter: 109 lb (49.4 kg).  Risk Assessment: Allergies: Reviewed. He is allergic to ibuprofen.  Allergy Precautions: None required Coagulopathies: Reviewed. None identified.  Blood-thinner therapy: None at this time Active Infection(s): Reviewed. None identified. Mr. Purtee is afebrile  Site Confirmation: Mr. Ursin was asked to confirm the procedure and laterality before marking the site Procedure checklist: Completed Consent: Before the procedure and under the influence of no sedative(s), amnesic(s), or anxiolytics, the patient was informed of the treatment options, risks and possible complications. To fulfill our ethical and legal obligations, as recommended by the American Medical Association's Code of Ethics, I have informed the patient of my clinical impression; the nature and purpose of the treatment or procedure; the risks, benefits, and possible complications of the intervention; the alternatives, including doing nothing; the risk(s) and benefit(s) of the alternative treatment(s) or procedure(s); and the risk(s) and benefit(s) of doing nothing. The patient was provided information about the general risks and possible complications associated with the procedure. These may include, but are not limited to: failure to  achieve desired goals, infection, bleeding, organ or nerve damage, allergic reactions, paralysis, and death. In addition,  the patient was informed of those risks and complications associated to Spine-related procedures, such as failure to decrease pain; infection (i.e.: Meningitis, epidural or intraspinal abscess); bleeding (i.e.: epidural hematoma, subarachnoid hemorrhage, or any other type of intraspinal or peri-dural bleeding); organ or nerve damage (i.e.: Any type of peripheral nerve, nerve root, or spinal cord injury) with subsequent damage to sensory, motor, and/or autonomic systems, resulting in permanent pain, numbness, and/or weakness of one or several areas of the body; allergic reactions; (i.e.: anaphylactic reaction); and/or death. Furthermore, the patient was informed of those risks and complications associated with the medications. These include, but are not limited to: allergic reactions (i.e.: anaphylactic or anaphylactoid reaction(s)); adrenal axis suppression; blood sugar elevation that in diabetics may result in ketoacidosis or comma; water retention that in patients with history of congestive heart failure may result in shortness of breath, pulmonary edema, and decompensation with resultant heart failure; weight gain; swelling or edema; medication-induced neural toxicity; particulate matter embolism and blood vessel occlusion with resultant organ, and/or nervous system infarction; and/or aseptic necrosis of one or more joints. Finally, the patient was informed that Medicine is not an exact science; therefore, there is also the possibility of unforeseen or unpredictable risks and/or possible complications that may result in a catastrophic outcome. The patient indicated having understood very clearly. We have given the patient no guarantees and we have made no promises. Enough time was given to the patient to ask questions, all of which were answered to the patient's satisfaction. Mr. Sandler has  indicated that he wanted to continue with the procedure. Attestation: I, the ordering provider, attest that I have discussed with the patient the benefits, risks, side-effects, alternatives, likelihood of achieving goals, and potential problems during recovery for the procedure that I have provided informed consent. Date  Time: 09/19/2021  1:45 PM  Pre-Procedure Preparation:  Monitoring: As per clinic protocol. Respiration, ETCO2, SpO2, BP, heart rate and rhythm monitor placed and checked for adequate function Safety Precautions: Patient was assessed for positional comfort and pressure points before starting the procedure. Time-out: I initiated and conducted the "Time-out" before starting the procedure, as per protocol. The patient was asked to participate by confirming the accuracy of the "Time Out" information. Verification of the correct person, site, and procedure were performed and confirmed by me, the nursing staff, and the patient. "Time-out" conducted as per Joint Commission's Universal Protocol (UP.01.01.01). Time: 1425  Description of Procedure:          Target Area: Caudal Epidural Canal. Approach: Midline approach. Area Prepped: Entire Posterior Sacrococcygeal Region DuraPrep (Iodine Povacrylex [0.7% available iodine] and Isopropyl Alcohol, 74% w/w) Safety Precautions: Aspiration looking for blood return was conducted prior to all injections. At no point did we inject any substances, as a needle was being advanced. No attempts were made at seeking any paresthesias. Safe injection practices and needle disposal techniques used. Medications properly checked for expiration dates. SDV (single dose vial) medications used. Description of the Procedure: Protocol guidelines were followed. The patient was placed in position over the fluoroscopy table. The target area was identified and the area prepped in the usual manner. Skin & deeper tissues infiltrated with local anesthetic. Appropriate amount  of time allowed to pass for local anesthetics to take effect. The procedure needles were then advanced to the target area. Proper needle placement secured. Negative aspiration confirmed. Solution injected in intermittent fashion, asking for systemic symptoms every 0.5cc of injectate. The needles were then removed and the area cleansed, making sure  to leave some of the prepping solution back to take advantage of its long term bactericidal properties. Vitals:   09/19/21 1401 09/19/21 1425 09/19/21 1430 09/19/21 1432  BP: (!) 103/56 104/63 106/62 107/69  Pulse: 74     Resp: 18 18 16 18   Temp: (!) 97.2 F (36.2 C)     TempSrc: Temporal     SpO2: 94% 96% 96% 97%  Weight: 109 lb (49.4 kg)     Height: 5\' 9"  (1.753 m)       Start Time: 1425 hrs. End Time: 1432 hrs. Materials:  Needle(s) Type: Epidural needle Gauge: 22G Length: 3.5-in Medication(s): Please see orders for medications and dosing details. 6 cc solution made of 3 cc of preservative-free saline, 2 cc of 0.2% ropivacaine, 1 cc of Decadron 10 mg/cc.  Imaging Guidance (Spinal):          Type of Imaging Technique: Fluoroscopy Guidance (Spinal) Indication(s): Assistance in needle guidance and placement for procedures requiring needle placement in or near specific anatomical locations not easily accessible without such assistance. Exposure Time: Please see nurses notes. Contrast: Before injecting any contrast, we confirmed that the patient did not have an allergy to iodine, shellfish, or radiological contrast. Once satisfactory needle placement was completed at the desired level, radiological contrast was injected. Contrast injected under live fluoroscopy. No contrast complications. See chart for type and volume of contrast used. Fluoroscopic Guidance: I was personally present during the use of fluoroscopy. "Tunnel Vision Technique" used to obtain the best possible view of the target area. Parallax error corrected before commencing the  procedure. "Direction-depth-direction" technique used to introduce the needle under continuous pulsed fluoroscopy. Once target was reached, antero-posterior, oblique, and lateral fluoroscopic projection used confirm needle placement in all planes. Images permanently stored in EMR. Interpretation: I personally interpreted the imaging intraoperatively. Adequate needle placement confirmed in multiple planes. Appropriate spread of contrast into desired area was observed. No evidence of afferent or efferent intravascular uptake. No intrathecal or subarachnoid spread observed. Permanent images saved into the patient's record.  Post-operative Assessment:  Post-procedure Vital Signs:  Pulse/HCG Rate: 7481 Temp:  (!) 97.2 F (36.2 C) Resp: 18 BP:  107/69 SpO2: 97 %  EBL: None  Complications: No immediate post-treatment complications observed by team, or reported by patient.  Note: The patient tolerated the entire procedure well. A repeat set of vitals were taken after the procedure and the patient was kept under observation following institutional policy, for this type of procedure. Post-procedural neurological assessment was performed, showing return to baseline, prior to discharge. The patient was provided with post-procedure discharge instructions, including a section on how to identify potential problems. Should any problems arise concerning this procedure, the patient was given instructions to immediately contact , at any time, without hesitation. In any case, we plan to contact the patient by telephone for a follow-up status report regarding this interventional procedure.  Comments:  No additional relevant information.  Plan of Care  Orders:  Orders Placed This Encounter  Procedures   DG PAIN CLINIC C-ARM 1-60 MIN NO REPORT    Intraoperative interpretation by procedural physician at [facility].    Standing Status:   Standing    Number of Occurrences:   1    Order Specific  Question:   Reason for exam:    Answer:   Assistance in needle guidance and placement for procedures requiring needle placement in or near specific anatomical locations not easily accessible without such assistance.   MR LUMBAR  SPINE WO CONTRAST    Patient presents with axial pain with possible radicular component. Please assist [place] in identifying specific level(s) and laterality of any additional findings such as: 1. Facet (Zygapophyseal) joint DJD (Hypertrophy, space narrowing, subchondral sclerosis, and/or osteophyte formation) 2. DDD and/or IVDD (Loss of disc height, desiccation, gas patterns, osteophytes, endplate sclerosis, or "Black disc disease") 3. Pars defects 4. Spondylolisthesis, spondylosis, and/or spondyloarthropathies (include Degree/Grade of displacement in mm) (stability) 5. Vertebral body Fractures (acute/chronic) (state percentage of collapse) 6. Demineralization (osteopenia/osteoporotic) 7. Bone pathology 8. Foraminal narrowing  9. Surgical changes 10. Central, Lateral Recess, and/or Foraminal Stenosis (include AP diameter of stenosis in mm) 11. Surgical changes (hardware type, status, and presence of fibrosis) 12. Modic Type Changes (MRI only) 13. IVDD (Disc bulge, protrusion, herniation, extrusion) (Level, laterality, extent)    Standing Status:   Future    Standing Expiration Date:   10/20/2021    Scheduling Instructions:     Imaging must be done as soon as possible. Inform patient that order will expire within 30 days and I will not renew it.    Order Specific Question:   What is the patient's sedation requirement?    Answer:   No Sedation    Order Specific Question:   Does the patient have a pacemaker or implanted devices?    Answer:   No    Order Specific Question:   Preferred imaging location?    Answer:   [facility] (table limit-350lbs)    Order Specific Question:   Call Results- Best Contact Number?    Answer:   [phone] [facility])     Order Specific Question:   Radiology Contrast Protocol - do NOT remove file path    Answer:   [email or web address]      Chronic Opioid Analgesic:  Percocet 10 mg 3 times daily as needed, quantity 90/month; MME equals 45    Medications ordered for procedure: Meds ordered this encounter  Medications   diazepam (VALIUM) tablet 5 mg    Make sure Flumazenil is available in the pyxis when using this medication. If oversedation occurs, administer 0.2 mg IV over 15 sec. If after 45 sec no response, administer 0.2 mg again over 1 min; may repeat at 1 min intervals; not to exceed 4 doses (1 mg)   iohexol (OMNIPAQUE) 180 MG/ML injection 10 mL    Must be Myelogram-compatible. If not available, you may substitute with a water-soluble, non-ionic, hypoallergenic, myelogram-compatible radiological contrast medium.   lidocaine (XYLOCAINE) 2 % (with pres) injection 400 mg   ropivacaine (PF) 2 mg/mL (0.2%) (NAROPIN) injection 2 mL   sodium chloride flush (NS) 0.9 % injection 2 mL   dexamethasone (DECADRON) injection 10 mg   Medications administered: We administered diazepam, iohexol, lidocaine, ropivacaine (PF) 2 mg/mL (0.2%), sodium chloride flush, and dexamethasone.  See the medical record for exact dosing, route, and time of administration.  Follow-up plan:   Return in about 6 weeks (around 10/31/2021) for Post Procedure Evaluation, virtual.       Recent Visits Date Type Provider Dept  09/04/21 Office Visit Edward Jolly, MD [facility]  Showing recent visits within past 90 days and meeting all other requirements Today's Visits Date Type Provider Dept  09/19/21 Procedure visit Edward Jolly, MD [facility]  Showing today's visits and meeting all other requirements Future Appointments Date Type Provider Dept  10/31/21 Appointment Edward Jolly, MD [facility]  12/04/21 Appointment Edward Jolly, MD [facility]  Showing  future  appointments within next 90 days and meeting all other requirements Disposition: Discharge home  Discharge (Date  Time): 09/19/2021; 1445 hrs.   Primary Care Physician: Oswaldo Conroy, MD Location: [facility] Note by: Edward Jolly, MD Date: 09/19/2021; Time: 2:40 PM  Disclaimer:  Medicine is not an exact science. The only guarantee in medicine is that nothing is guaranteed. It is important to note that the decision to proceed with this intervention was based on the information collected from the patient. The Data and conclusions were drawn from the patient's questionnaire, the interview, and the physical examination. Because the information was provided in large part by the patient, it cannot be guaranteed that it has not been purposely or unconsciously manipulated. Every effort has been made to obtain as much relevant data as possible for this evaluation. It is important to note that the conclusions that lead to this procedure are derived in large part from the available data. Always take into account that the treatment will also be dependent on availability of resources and existing treatment guidelines, considered by other Pain Management Practitioners as being common knowledge and practice, at the time of the intervention. For Medico-Legal purposes, it is also important to point out that variation in procedural techniques and pharmacological choices are the acceptable norm. The indications, contraindications, technique, and results of the above procedure should only be interpreted and judged by a Board-Certified Interventional Pain Specialist with extensive familiarity and expertise in the same exact procedure and technique.

## 2021-09-20 ENCOUNTER — Telehealth: Payer: Self-pay | Admitting: *Deleted

## 2021-09-20 NOTE — Telephone Encounter (Signed)
Called for post procedure check. Denies any issues. 

## 2021-10-01 ENCOUNTER — Ambulatory Visit: Payer: Medicare HMO

## 2021-10-04 ENCOUNTER — Ambulatory Visit: Payer: Medicare HMO

## 2021-10-30 ENCOUNTER — Encounter: Payer: Self-pay | Admitting: Student in an Organized Health Care Education/Training Program

## 2021-10-31 ENCOUNTER — Ambulatory Visit
Payer: Medicare HMO | Attending: Student in an Organized Health Care Education/Training Program | Admitting: Student in an Organized Health Care Education/Training Program

## 2021-10-31 ENCOUNTER — Other Ambulatory Visit: Payer: Self-pay

## 2021-10-31 DIAGNOSIS — M25552 Pain in left hip: Secondary | ICD-10-CM

## 2021-10-31 DIAGNOSIS — Z8673 Personal history of transient ischemic attack (TIA), and cerebral infarction without residual deficits: Secondary | ICD-10-CM

## 2021-10-31 DIAGNOSIS — M47812 Spondylosis without myelopathy or radiculopathy, cervical region: Secondary | ICD-10-CM

## 2021-10-31 DIAGNOSIS — M5136 Other intervertebral disc degeneration, lumbar region: Secondary | ICD-10-CM

## 2021-10-31 DIAGNOSIS — M48061 Spinal stenosis, lumbar region without neurogenic claudication: Secondary | ICD-10-CM

## 2021-10-31 DIAGNOSIS — G8929 Other chronic pain: Secondary | ICD-10-CM

## 2021-10-31 DIAGNOSIS — G894 Chronic pain syndrome: Secondary | ICD-10-CM

## 2021-10-31 DIAGNOSIS — Z0289 Encounter for other administrative examinations: Secondary | ICD-10-CM | POA: Diagnosis not present

## 2021-10-31 NOTE — Progress Notes (Signed)
Patient: Logan Vazquez  Service Category: E/M  Provider: Gillis Santa, MD  DOB: July 05, 1946  DOS: 10/31/2021  Location: Office  MRN: [id number]  Setting: Ambulatory outpatient  Referring Provider: Letta Median, MD  Type: Established Patient  Specialty: Interventional Pain Management  PCP: Letta Median, MD  Location: Remote location  Delivery: TeleHealth     Virtual Encounter - Pain Management PROVIDER NOTE: Information contained herein reflects review and annotations entered in association with encounter. Interpretation of such information and data should be left to medically-trained personnel. Information provided to patient can be located elsewhere in the medical record under "Patient Instructions". Document created using STT-dictation technology, any transcriptional errors that may result from process are unintentional.    Contact & Pharmacy Preferred: [phone] Home: [phone] (home) Mobile: [phone] (mobile) E-mail: No e-mail address on record  [facility], [place] [address] [place] [address] Phone: [phone] Fax: [phone]  [facility], [place] [address] Phone: [phone] Fax: [phone]   Pre-screening  Mr. Salzwedel offered "in-person" vs "virtual" encounter. He indicated preferring virtual for this encounter.   Reason COVID-19*  Social distancing based on CDC and AMA recommendations.   I contacted Norva Pavlov on 10/31/2021 via telephone.      I clearly identified myself as Gillis Santa, MD. I verified that I was speaking with the correct person using two identifiers (Name: VISHNU MOELLER, and date of birth: 1946-10-14).  Consent I sought verbal advanced consent from Norva Pavlov for virtual visit interactions. I informed Mr. Pinson of possible security and privacy concerns, risks, and limitations associated with providing  "not-in-person" medical evaluation and management services. I also informed Mr. Lamison of the availability of "in-person" appointments. Finally, I informed him that there would be a charge for the virtual visit and that he could be  personally, fully or partially, financially responsible for it. Mr. Fetterman expressed understanding and agreed to proceed.   Historic Elements   Mr. SHADE KALEY is a 75 y.o. year old, male patient evaluated today after our last contact on 09/19/2021. Mr. Nave  has a past medical history of Arthritis, Asthma, CAD (coronary artery disease), Depression, Emphysema lung ([place]), Glaucoma, and Hypertension. He also  has a past surgical history that includes heart surgery (07/1999); Hip surgery (Left, 1968); Back surgery (1985); Skin graft (1968); and Humerus fracture surgery (1968). Mr. Huebert has a current medication list which includes the following prescription(s): albuterol, amlodipine, aspirin ec, atorvastatin, buspirone, b-12, docusate sodium, fluoxetine, fluoxetine, hydrocortisone, lisinopril, loperamide, mupirocin cream, nitroglycerin, omeprazole, oxycodone-acetaminophen, [START ON 11/03/2021] oxycodone-acetaminophen, pregabalin, tizanidine, and oxycodone-acetaminophen. He  reports that he has been smoking cigarettes. He has a 29.00 pack-year smoking history. He has never used smokeless tobacco. He reports that he does not drink alcohol and does not use drugs. Mr. Coole is allergic to ibuprofen.   HPI  Today, he is being contacted for a post-procedure assessment.   Post-Procedure Evaluation  Procedure (09/19/2021):   Type: Diagnostic Epidural Steroid Injection          Region: Caudal Level: Sacrococcygeal   Laterality: Midline    Anxiolysis: Please see nurses note.  Effectiveness during initial hour after procedure (Ultra-Short Term Relief): 100%  Local anesthetic used: Long-acting (4-6 hours) Effectiveness: Defined as any analgesic benefit obtained secondary to  the administration of local anesthetics. This carries significant diagnostic value  as to the etiological location, or anatomical origin, of the pain. Duration of benefit is expected to coincide with the duration of the local anesthetic used.  Effectiveness during initial 4-6 hours after procedure (Short-Term Relief): 70%  Long-term benefit: Defined as any relief past the pharmacologic duration of the local anesthetics.  Effectiveness past the initial 6 hours after procedure (Long-Term Relief):10%  Benefits, current: Defined as benefit present at the time of this evaluation.   Analgesia:  0%    Pharmacotherapy Assessment   Analgesic: Percocet 10 mg 3 times daily as needed, quantity 90/month; MME equals 45    Monitoring: [place] PMP: PDMP reviewed during this encounter.       Pharmacotherapy: No side-effects or adverse reactions reported. Compliance: No problems identified. Effectiveness: Clinically acceptable. Plan: Refer to "POC". UDS:  Summary  Date Value Ref Range Status  01/05/2019 FINAL  Final    Comment:    ==================================================================== TOXASSURE SELECT 13 (MW) ==================================================================== Test                             Result       Flag       Units Drug Present and Declared for Prescription Verification   Oxycodone                      1254         EXPECTED   ng/mg creat   Oxymorphone                    310          EXPECTED   ng/mg creat   Noroxycodone                   4592         EXPECTED   ng/mg creat   Noroxymorphone                 247          EXPECTED   ng/mg creat    Sources of oxycodone are scheduled prescription medications.    Oxymorphone, noroxycodone, and noroxymorphone are expected    metabolites of oxycodone. Oxymorphone is also available as a    scheduled prescription medication. Drug Present not Declared for Prescription Verification   Alprazolam                     15            UNEXPECTED ng/mg creat   Alpha-hydroxyalprazolam        19           UNEXPECTED ng/mg creat    Source of alprazolam is a scheduled prescription medication.    Alpha-hydroxyalprazolam is an expected metabolite of alprazolam. ==================================================================== Test                      Result    Flag   Units      Ref Range   Creatinine              150              mg/dL      >=20 ==================================================================== Declared Medications:  The flagging and interpretation on this report are based on the  following declared medications.  Unexpected results may arise from  inaccuracies in the declared medications.  **Note: The testing scope of this panel  includes these medications:  Oxycodone (Oxycodone Acetaminophen)  **Note: The testing scope of this panel does not include following  reported medications:  Acetaminophen (Oxycodone Acetaminophen)  Albuterol  Aspirin (Aspirin 81)  Atorvastatin  Baclofen  Celecoxib  Cholecalciferol  Cyanocobalamin  Docusate  Fluoxetine  Hydrocortisone  Lisinopril  Mupirocin  Nitroglycerin  Pregabalin ==================================================================== For clinical consultation, please call [phone]. ====================================================================      Laboratory Chemistry Profile   Renal Lab Results  Component Value Date   BUN 15 10/23/2017   CREATININE 0.92 14/15/9733   BCR NOT APPLICABLE 12/50/8719    Hepatic Lab Results  Component Value Date   AST 14 10/23/2017   ALT 10 10/23/2017    Electrolytes Lab Results  Component Value Date   NA 138 10/23/2017   K 4.9 10/23/2017   CL 102 10/23/2017   CALCIUM 9.5 10/23/2017    Bone No results found for: VD25OH, VD125OH2TOT, BO1290YB5, VD9179EB7, 25OHVITD1, 25OHVITD2, 25OHVITD3, TESTOFREE, TESTOSTERONE  Inflammation (CRP: Acute Phase) (ESR: Chronic Phase) No results found for:  CRP, ESRSEDRATE, LATICACIDVEN       Note: Above Lab results reviewed.   Assessment  The primary encounter diagnosis was Spinal stenosis of lumbar region, unspecified whether neurogenic claudication present. Diagnoses of Other intervertebral disc degeneration, lumbar region, Cervical facet joint syndrome, Chronic hip pain, left, History of CVA (cerebrovascular accident), Pain medication agreement, and Chronic pain syndrome were also pertinent to this visit.  Plan of Care   Unfortunately, no significant long-term pain relief with previous caudal ESI.  Instructed to continue with his multimodal analgesics including oxycodone, Lyrica, tizanidine as prescribed.  Follow-up as scheduled next month for medication management.  Follow-up plan:   Return for Keep sch. appt.    Recent Visits Date Type Provider Dept  09/19/21 Procedure visit Gillis Santa, MD [facility]  09/04/21 Office Visit Gillis Santa, MD [facility]  Showing recent visits within past 90 days and meeting all other requirements Today's Visits Date Type Provider Dept  10/31/21 Office Visit Gillis Santa, MD [facility]  Showing today's visits and meeting all other requirements Future Appointments Date Type Provider Dept  12/04/21 Appointment Gillis Santa, MD [facility]  Showing future appointments within next 90 days and meeting all other requirements I discussed the assessment and treatment plan with the patient. The patient was provided an opportunity to ask questions and all were answered. The patient agreed with the plan and demonstrated an understanding of the instructions.  Patient advised to call back or seek an in-person evaluation if the symptoms or condition worsens.  Duration of encounter: 3mnutes.  Note by: BGillis Santa MD Date: 10/31/2021; Time: 3:18 PM

## 2021-11-27 ENCOUNTER — Encounter: Payer: Medicare HMO | Admitting: Student in an Organized Health Care Education/Training Program

## 2021-12-04 ENCOUNTER — Other Ambulatory Visit: Payer: Self-pay

## 2021-12-04 ENCOUNTER — Ambulatory Visit
Payer: Medicare HMO | Attending: Student in an Organized Health Care Education/Training Program | Admitting: Student in an Organized Health Care Education/Training Program

## 2021-12-04 ENCOUNTER — Encounter: Payer: Self-pay | Admitting: Student in an Organized Health Care Education/Training Program

## 2021-12-04 VITALS — BP 112/62 | HR 81 | Temp 97.3°F | Resp 14 | Ht 69.0 in | Wt 108.0 lb

## 2021-12-04 DIAGNOSIS — Z79891 Long term (current) use of opiate analgesic: Secondary | ICD-10-CM

## 2021-12-04 DIAGNOSIS — G8929 Other chronic pain: Secondary | ICD-10-CM

## 2021-12-04 DIAGNOSIS — M25552 Pain in left hip: Secondary | ICD-10-CM

## 2021-12-04 DIAGNOSIS — Z0289 Encounter for other administrative examinations: Secondary | ICD-10-CM

## 2021-12-04 DIAGNOSIS — M5136 Other intervertebral disc degeneration, lumbar region: Secondary | ICD-10-CM

## 2021-12-04 DIAGNOSIS — M48061 Spinal stenosis, lumbar region without neurogenic claudication: Secondary | ICD-10-CM | POA: Diagnosis not present

## 2021-12-04 DIAGNOSIS — M47894 Other spondylosis, thoracic region: Secondary | ICD-10-CM

## 2021-12-04 DIAGNOSIS — Z8673 Personal history of transient ischemic attack (TIA), and cerebral infarction without residual deficits: Secondary | ICD-10-CM

## 2021-12-04 DIAGNOSIS — M47812 Spondylosis without myelopathy or radiculopathy, cervical region: Secondary | ICD-10-CM

## 2021-12-04 DIAGNOSIS — M5416 Radiculopathy, lumbar region: Secondary | ICD-10-CM

## 2021-12-04 DIAGNOSIS — G894 Chronic pain syndrome: Secondary | ICD-10-CM

## 2021-12-04 MED ORDER — PREGABALIN 75 MG PO CAPS: 150.0000 mg | ORAL_CAPSULE | Freq: Every evening | ORAL | 5 refills | Status: DC | PRN

## 2021-12-04 MED ORDER — OXYCODONE-ACETAMINOPHEN 10-325 MG PO TABS: 1.0000 | ORAL_TABLET | Freq: Three times a day (TID) | ORAL | 0 refills | Status: DC | PRN

## 2021-12-04 MED ORDER — TIZANIDINE HCL 4 MG PO TABS: 4.0000 mg | ORAL_TABLET | Freq: Every day | ORAL | 5 refills | Status: DC

## 2021-12-04 NOTE — Progress Notes (Signed)
PROVIDER NOTE: Information contained herein reflects review and annotations entered in association with encounter. Interpretation of such information and data should be left to medically-trained personnel. Information provided to patient can be located elsewhere in the medical record under "Patient Instructions". Document created using STT-dictation technology, any transcriptional errors that may result from process are unintentional.    Patient: Logan Vazquez  Service Category: E/M  Provider: Gillis Santa, MD  DOB: [birth year]  DOS: 12/04/2021  Specialty: Interventional Pain Management  MRN: [id number]  Setting: Ambulatory outpatient  PCP: Letta Median, MD  Type: Established Patient    Referring Provider: Letta Median, MD  Location: Office  Delivery: Face-to-face     HPI  Logan Vazquez, a 75 y.o. year old male, is here today because of his Spinal stenosis of lumbar region, unspecified whether neurogenic claudication present [M48.061]. [name] primary complain today is Back Pain (lower)  Last encounter: My last encounter with him was on 10/31/21  Pertinent problems: [name] has Anxiety disorder; Atherosclerotic heart disease of native coronary artery without angina pectoris; History of lumbar spinal fusion; Lumbar degenerative disc disease; Recurrent major depressive disorder, in partial remission ([place]); Chronic pain syndrome; Long term current use of opiate analgesic; Chronic bilateral low back pain with bilateral sciatica; Chronic hip pain, left; and Spinal stenosis of lumbar region on their pertinent problem list. Pain Assessment: Severity of Chronic pain is reported as a 10-Worst pain ever/10. Location: Back Lower/both legs to the calf. Onset: More than a month ago. Quality: Sharp. Timing: Constant. Modifying factor(s): medications. Vitals:  height is 5' 9"  (1.753 m) and weight is 108 lb (49 kg). His temporal temperature is 97.3 F (36.3 C) (abnormal). His blood pressure  is 112/62 and his pulse is 81. His respiration is 14 and oxygen saturation is 99%.   Reason for encounter: medication management.    No change in medical history since last visit.  Patient's pain is at baseline.  Patient continues multimodal pain regimen as prescribed.  States that it provides pain relief and improvement in functional status.   Pharmacotherapy Assessment  Analgesic: Percocet 10 mg 3 times daily as needed, quantity 90/month; MME equals 45    Monitoring: [place] PMP: PDMP not reviewed this encounter.       Pharmacotherapy: No side-effects or adverse reactions reported. Compliance: No problems identified. Effectiveness: Clinically acceptable.  UDS:  Summary  Date Value Ref Range Status  01/05/2019 FINAL  Final    Comment:    ==================================================================== TOXASSURE SELECT 13 (MW) ==================================================================== Test                             Result       Flag       Units Drug Present and Declared for Prescription Verification   Oxycodone                      1254         EXPECTED   ng/mg creat   Oxymorphone                    310          EXPECTED   ng/mg creat   Noroxycodone                   4592         EXPECTED   ng/mg creat   Noroxymorphone  247          EXPECTED   ng/mg creat    Sources of oxycodone are scheduled prescription medications.    Oxymorphone, noroxycodone, and noroxymorphone are expected    metabolites of oxycodone. Oxymorphone is also available as a    scheduled prescription medication. Drug Present not Declared for Prescription Verification   Alprazolam                     15           UNEXPECTED ng/mg creat   Alpha-hydroxyalprazolam        19           UNEXPECTED ng/mg creat    Source of alprazolam is a scheduled prescription medication.    Alpha-hydroxyalprazolam is an expected metabolite of  alprazolam. ==================================================================== Test                      Result    Flag   Units      Ref Range   Creatinine              150              mg/dL      >=20 ==================================================================== Declared Medications:  The flagging and interpretation on this report are based on the  following declared medications.  Unexpected results may arise from  inaccuracies in the declared medications.  **Note: The testing scope of this panel includes these medications:  Oxycodone (Oxycodone Acetaminophen)  **Note: The testing scope of this panel does not include following  reported medications:  Acetaminophen (Oxycodone Acetaminophen)  Albuterol  Aspirin (Aspirin 81)  Atorvastatin  Baclofen  Celecoxib  Cholecalciferol  Cyanocobalamin  Docusate  Fluoxetine  Hydrocortisone  Lisinopril  Mupirocin  Nitroglycerin  Pregabalin ==================================================================== For clinical consultation, please call [phone]. ====================================================================       ROS  Constitutional: Denies any fever or chills Gastrointestinal: No reported hemesis, hematochezia, vomiting, or acute GI distress Musculoskeletal:  Cervical, thoracic, lumbar pain, with radiation down to right posterior lateral calf Neurological: No reported episodes of acute onset apraxia, aphasia, dysarthria, agnosia, amnesia, paralysis, loss of coordination, or loss of consciousness  Medication Review  B-12, FLUoxetine, albuterol, amLODipine, aspirin EC, atorvastatin, busPIRone, docusate sodium, hydrocortisone, lisinopril, loperamide, mupirocin cream, nitroGLYCERIN, omeprazole, oxyCODONE-acetaminophen, pregabalin, and tiZANidine  History Review  Allergy: [name] is allergic to ibuprofen. Drug: [name]  reports no history of drug use. Alcohol:  reports no history of alcohol  use. Tobacco:  reports that he has been smoking cigarettes. He has a 29.00 pack-year smoking history. He has never used smokeless tobacco. Social: Logan Vazquez  reports that he has been smoking cigarettes. He has a 29.00 pack-year smoking history. He has never used smokeless tobacco. He reports that he does not drink alcohol and does not use drugs. Medical:  has a past medical history of Arthritis, Asthma, CAD (coronary artery disease), Depression, Emphysema lung ([place]), Glaucoma, and Hypertension. Surgical: Logan Vazquez  has a past surgical history that includes heart surgery (07/1999); Hip surgery (Left, 1968); Back surgery (1985); Skin graft (1968); and Humerus fracture surgery (1968). Family: family history includes Cancer in his father; Hearing loss in his mother; Heart attack in his brother; Heart disease in his brother; Hypertension in his father and mother; Stroke in his sister.  Laboratory Chemistry Profile   Renal Lab Results  Component Value Date   BUN 15 10/23/2017   CREATININE 0.92 10/23/2017  BCR NOT APPLICABLE 81/82/9937     Hepatic Lab Results  Component Value Date   AST 14 10/23/2017   ALT 10 10/23/2017     Electrolytes Lab Results  Component Value Date   NA 138 10/23/2017   K 4.9 10/23/2017   CL 102 10/23/2017   CALCIUM 9.5 10/23/2017     Bone No results found for: VD25OH, VD125OH2TOT, [id number], BO1751WC5, 25OHVITD1, 25OHVITD2, 25OHVITD3, TESTOFREE, TESTOSTERONE   Inflammation (CRP: Acute Phase) (ESR: Chronic Phase) No results found for: CRP, ESRSEDRATE, LATICACIDVEN     Note: Above Lab results reviewed.   Physical Exam  General appearance: Well nourished, well developed, and well hydrated. In no apparent acute distress Mental status: Alert, oriented x 3 (person, place, & time)       Respiratory: No evidence of acute respiratory distress Eyes: PERLA Vitals: BP 112/62    Pulse 81    Temp (!) 97.3 F (36.3 C) (Temporal)    Resp 14    Ht 5' 9"  (1.753 m)    Wt  108 lb (49 kg)    SpO2 99%    BMI 15.95 kg/m  BMI: Estimated body mass index is 15.95 kg/m as calculated from the following:   Height as of this encounter: 5' 9"  (1.753 m).   Weight as of this encounter: 108 lb (49 kg). Ideal: Male patients must weigh at least 50 kg to calculate ideal body weight  Cervical Spine Area Exam  Skin & Axial Inspection: No masses, redness, edema, swelling, or associated skin lesions Alignment: Symmetrical Functional ROM: Pain restricted ROM      Stability: No instability detected Muscle Tone/Strength: Functionally intact. No obvious neuro-muscular anomalies detected. Sensory (Neurological): Musculoskeletal pain pattern Palpation: No palpable anomalies               Lumbar Spine Area Exam  Skin & Axial Inspection:  Well healed scar from prior surgery Alignment: Symmetrical Functional ROM: Pain restricted ROM affecting both sides Stability: No instability detected Muscle Tone/Strength: Functionally intact. No obvious neuro-muscular anomalies detected. Sensory (Neurological): Dermatomal pain pattern on the right   Gait & Posture Assessment  Ambulation: Limited Gait: Significantly limited. Dependent on assistive device to ambulate Posture: Difficulty standing up straight, due to pain  Lower Extremity Exam      Side: Right lower extremity   Side: Left lower extremity  Stability: No instability observed           Stability: No instability observed          Skin & Extremity Inspection: Skin color, temperature, and hair growth are WNL. No peripheral edema or cyanosis. No masses, redness, swelling, asymmetry, or associated skin lesions. No contractures.   Skin & Extremity Inspection: Skin color, temperature, and hair growth are WNL. No peripheral edema or cyanosis. No masses, redness, swelling, asymmetry, or associated skin lesions. No contractures.  Functional ROM: Decreased ROM for hip and knee joints           Functional ROM: Decreased ROM for hip and knee  joints          Muscle Tone/Strength: Functionally intact. No obvious neuro-muscular anomalies detected.   Muscle Tone/Strength: Functionally intact. No obvious neuro-muscular anomalies detected.  Sensory (Neurological): Dermatomal pain pattern on the right       Sensory (Neurological): Musculoskeletal pain pattern        DTR: Patellar: deferred today Achilles: deferred today Plantar: deferred today   DTR: Patellar: deferred today Achilles: deferred today Plantar: deferred today  Palpation: No  palpable anomalies   Palpation: No palpable anomalies    4 out of 5 strength bilateral lower extremity: Plantar flexion, dorsiflexion, knee flexion, knee extension.   Assessment   Status Diagnosis  Persistent Persistent Persistent 1. Spinal stenosis of lumbar region, unspecified whether neurogenic claudication present   2. Other intervertebral disc degeneration, lumbar region   3. Cervical facet joint syndrome   4. Chronic hip pain, left   5. History of CVA (cerebrovascular accident)   6. Pain medication agreement   7. Chronic radicular lumbar pain   8. Thoracic facet joint syndrome   9. Long term current use of opiate analgesic   10. Chronic pain syndrome         Plan of Care   Logan Vazquez has a current medication list which includes the following long-term medication(s): albuterol, amlodipine, atorvastatin, fluoxetine, fluoxetine, lisinopril, nitroglycerin, oxycodone-acetaminophen, [START ON 01/03/2022] oxycodone-acetaminophen, [START ON 02/02/2022] oxycodone-acetaminophen, and pregabalin.  1. Spinal stenosis of lumbar region, unspecified whether neurogenic claudication present - oxyCODONE-acetaminophen (PERCOCET) 10-325 MG tablet; Take 1 tablet by mouth every 8 (eight) hours as needed for pain. For chronic pain syndrome  Dispense: 90 tablet; Refill: 0 - oxyCODONE-acetaminophen (PERCOCET) 10-325 MG tablet; Take 1 tablet by mouth every 8 (eight) hours as needed for pain. For  chronic pain syndrome  Dispense: 90 tablet; Refill: 0 - oxyCODONE-acetaminophen (PERCOCET) 10-325 MG tablet; Take 1 tablet by mouth every 8 (eight) hours as needed for pain. For chronic pain syndrome  Dispense: 90 tablet; Refill: 0 - pregabalin (LYRICA) 75 MG capsule; Take 2 capsules (150 mg total) by mouth at bedtime as needed.  Dispense: 60 capsule; Refill: 5 - tiZANidine (ZANAFLEX) 4 MG tablet; Take 1 tablet (4 mg total) by mouth at bedtime.  Dispense: 30 tablet; Refill: 5  2. Other intervertebral disc degeneration, lumbar region  3. Cervical facet joint syndrome - oxyCODONE-acetaminophen (PERCOCET) 10-325 MG tablet; Take 1 tablet by mouth every 8 (eight) hours as needed for pain. For chronic pain syndrome  Dispense: 90 tablet; Refill: 0 - oxyCODONE-acetaminophen (PERCOCET) 10-325 MG tablet; Take 1 tablet by mouth every 8 (eight) hours as needed for pain. For chronic pain syndrome  Dispense: 90 tablet; Refill: 0 - oxyCODONE-acetaminophen (PERCOCET) 10-325 MG tablet; Take 1 tablet by mouth every 8 (eight) hours as needed for pain. For chronic pain syndrome  Dispense: 90 tablet; Refill: 0  4. Chronic hip pain, left  5. History of CVA (cerebrovascular accident)  6. Pain medication agreement  7. Chronic radicular lumbar pain  8. Thoracic facet joint syndrome - oxyCODONE-acetaminophen (PERCOCET) 10-325 MG tablet; Take 1 tablet by mouth every 8 (eight) hours as needed for pain. For chronic pain syndrome  Dispense: 90 tablet; Refill: 0 - oxyCODONE-acetaminophen (PERCOCET) 10-325 MG tablet; Take 1 tablet by mouth every 8 (eight) hours as needed for pain. For chronic pain syndrome  Dispense: 90 tablet; Refill: 0 - oxyCODONE-acetaminophen (PERCOCET) 10-325 MG tablet; Take 1 tablet by mouth every 8 (eight) hours as needed for pain. For chronic pain syndrome  Dispense: 90 tablet; Refill: 0  9. Long term current use of opiate analgesic  10. Chronic pain syndrome - oxyCODONE-acetaminophen (PERCOCET)  10-325 MG tablet; Take 1 tablet by mouth every 8 (eight) hours as needed for pain. For chronic pain syndrome  Dispense: 90 tablet; Refill: 0 - oxyCODONE-acetaminophen (PERCOCET) 10-325 MG tablet; Take 1 tablet by mouth every 8 (eight) hours as needed for pain. For chronic pain syndrome  Dispense: 90 tablet; Refill: 0 - oxyCODONE-acetaminophen (  PERCOCET) 10-325 MG tablet; Take 1 tablet by mouth every 8 (eight) hours as needed for pain. For chronic pain syndrome  Dispense: 90 tablet; Refill: 0 - pregabalin (LYRICA) 75 MG capsule; Take 2 capsules (150 mg total) by mouth at bedtime as needed.  Dispense: 60 capsule; Refill: 5 - tiZANidine (ZANAFLEX) 4 MG tablet; Take 1 tablet (4 mg total) by mouth at bedtime.  Dispense: 30 tablet; Refill: 5 - Drug Screen 10 W/Conf, Serum    Pharmacotherapy (Medications Ordered): Meds ordered this encounter  Medications   oxyCODONE-acetaminophen (PERCOCET) 10-325 MG tablet    Sig: Take 1 tablet by mouth every 8 (eight) hours as needed for pain. For chronic pain syndrome    Dispense:  90 tablet    Refill:  0   oxyCODONE-acetaminophen (PERCOCET) 10-325 MG tablet    Sig: Take 1 tablet by mouth every 8 (eight) hours as needed for pain. For chronic pain syndrome    Dispense:  90 tablet    Refill:  0   oxyCODONE-acetaminophen (PERCOCET) 10-325 MG tablet    Sig: Take 1 tablet by mouth every 8 (eight) hours as needed for pain. For chronic pain syndrome    Dispense:  90 tablet    Refill:  0   pregabalin (LYRICA) 75 MG capsule    Sig: Take 2 capsules (150 mg total) by mouth at bedtime as needed.    Dispense:  60 capsule    Refill:  5   tiZANidine (ZANAFLEX) 4 MG tablet    Sig: Take 1 tablet (4 mg total) by mouth at bedtime.    Dispense:  30 tablet    Refill:  5    Do not place this medication, or any other prescription from our practice, on "Automatic Refill". Patient may have prescription filled one day early if pharmacy is closed on scheduled refill date.    Orders Placed This Encounter  Procedures   Drug Screen 10 W/Conf, Serum    Order Specific Question:   Release to patient    Answer:   Immediate    Follow-up plan:   Return in about 3 months (around 03/04/2022) for Medication Management, in person.   Recent Visits Date Type Provider Dept  10/31/21 Office Visit Gillis Santa, MD [facility]  09/19/21 Procedure visit Gillis Santa, MD [facility]  Showing recent visits within past 90 days and meeting all other requirements Today's Visits Date Type Provider Dept  12/04/21 Office Visit Gillis Santa, MD [facility]  Showing today's visits and meeting all other requirements Future Appointments No visits were found meeting these conditions. Showing future appointments within next 90 days and meeting all other requirements  I discussed the assessment and treatment plan with the patient. The patient was provided an opportunity to ask questions and all were answered. The patient agreed with the plan and demonstrated an understanding of the instructions.  Patient advised to call back or seek an in-person evaluation if the symptoms or condition worsens.  Duration of encounter: 82mnutes.  Note by: BGillis Santa MD Date: 12/04/2021; Time: 2:56 PM

## 2021-12-04 NOTE — Patient Instructions (Signed)

## 2021-12-04 NOTE — Progress Notes (Signed)
Nursing Pain Medication Assessment:  Safety precautions to be maintained throughout the outpatient stay will include: orient to surroundings, keep bed in low position, maintain call bell within reach at all times, provide assistance with transfer out of bed and ambulation.  Medication Inspection Compliance: Pill count conducted under aseptic conditions, in front of the patient. Neither the pills nor the bottle was removed from the patient's sight at any time. Once count was completed pills were immediately returned to the patient in their original bottle.  Medication: Oxycodone/APAP Pill/Patch Count:  0 of 90 pills remain Pill/Patch Appearance: Markings consistent with prescribed medication Bottle Appearance: Standard pharmacy container. Clearly labeled. Filled Date: 24 / 19 / 2022 Last Medication intake:  Yesterday

## 2021-12-10 LAB — DRUG SCREEN 10 W/CONF, SERUM
Amphetamines, IA: NEGATIVE ng/mL
Barbiturates, IA: NEGATIVE ug/mL
Benzodiazepines, IA: NEGATIVE ng/mL
Cocaine & Metabolite, IA: NEGATIVE ng/mL
Methadone, IA: NEGATIVE ng/mL
Opiates, IA: NEGATIVE ng/mL
Oxycodones, IA: NEGATIVE ng/mL
Phencyclidine, IA: NEGATIVE ng/mL
Propoxyphene, IA: NEGATIVE ng/mL
THC(Marijuana) Metabolite, IA: NEGATIVE ng/mL

## 2021-12-25 ENCOUNTER — Telehealth: Payer: Self-pay | Admitting: Student in an Organized Health Care Education/Training Program

## 2021-12-25 NOTE — Telephone Encounter (Signed)
Patient called and instructed that he should only be taking 2 Lyrica at bedtime per Dr. Cherylann Ratel. Understands instructions and states he will not take 3 but will only take as directed, 2 at bedtime (150mg  total).

## 2021-12-25 NOTE — Telephone Encounter (Signed)
Patient says his meds were supposed to be increased, pregabalin, he has been taking meds like Dr. Cherylann Ratel told him but wont have enough to last to next refill. Please check with Dr. Cherylann Ratel and ask him about the increase to 3x daily instead of 2x daily. Please advise patient

## 2021-12-25 NOTE — Telephone Encounter (Signed)
Patient has 5 refills on a script sent in 12/04/2021. I instructed him to call the pharmacy for a refill and if his insurance won't let him do it to call us back and will will have Dr. Cherylann Ratel fix the script to say 3/day. Patient understands instructions.

## 2021-12-25 NOTE — Telephone Encounter (Signed)
I have also sent a message  to Dr. Holley Raring to clarify the dose.

## 2022-02-28 ENCOUNTER — Ambulatory Visit
Payer: Medicare Other | Attending: Student in an Organized Health Care Education/Training Program | Admitting: Student in an Organized Health Care Education/Training Program

## 2022-02-28 ENCOUNTER — Other Ambulatory Visit: Payer: Self-pay

## 2022-02-28 ENCOUNTER — Encounter: Payer: Self-pay | Admitting: Student in an Organized Health Care Education/Training Program

## 2022-02-28 VITALS — BP 160/75 | HR 67 | Temp 97.2°F | Resp 16 | Ht 69.0 in | Wt 108.0 lb

## 2022-02-28 DIAGNOSIS — M47894 Other spondylosis, thoracic region: Secondary | ICD-10-CM | POA: Insufficient documentation

## 2022-02-28 DIAGNOSIS — Z8673 Personal history of transient ischemic attack (TIA), and cerebral infarction without residual deficits: Secondary | ICD-10-CM | POA: Insufficient documentation

## 2022-02-28 DIAGNOSIS — M5136 Other intervertebral disc degeneration, lumbar region: Secondary | ICD-10-CM | POA: Insufficient documentation

## 2022-02-28 DIAGNOSIS — Z0289 Encounter for other administrative examinations: Secondary | ICD-10-CM | POA: Insufficient documentation

## 2022-02-28 DIAGNOSIS — G894 Chronic pain syndrome: Secondary | ICD-10-CM | POA: Diagnosis present

## 2022-02-28 DIAGNOSIS — M25552 Pain in left hip: Secondary | ICD-10-CM | POA: Diagnosis not present

## 2022-02-28 DIAGNOSIS — Z79891 Long term (current) use of opiate analgesic: Secondary | ICD-10-CM | POA: Diagnosis present

## 2022-02-28 DIAGNOSIS — M5416 Radiculopathy, lumbar region: Secondary | ICD-10-CM | POA: Insufficient documentation

## 2022-02-28 DIAGNOSIS — M47812 Spondylosis without myelopathy or radiculopathy, cervical region: Secondary | ICD-10-CM | POA: Diagnosis not present

## 2022-02-28 DIAGNOSIS — M48061 Spinal stenosis, lumbar region without neurogenic claudication: Secondary | ICD-10-CM | POA: Diagnosis not present

## 2022-02-28 DIAGNOSIS — G8929 Other chronic pain: Secondary | ICD-10-CM | POA: Insufficient documentation

## 2022-02-28 MED ORDER — TIZANIDINE HCL 4 MG PO TABS: 4.0000 mg | ORAL_TABLET | Freq: Two times a day (BID) | ORAL | 5 refills | Status: AC | PRN

## 2022-02-28 MED ORDER — OXYCODONE-ACETAMINOPHEN 10-325 MG PO TABS: 1.0000 | ORAL_TABLET | Freq: Three times a day (TID) | ORAL | 0 refills | Status: DC | PRN

## 2022-02-28 MED ORDER — PREGABALIN 75 MG PO CAPS: ORAL_CAPSULE | ORAL | 5 refills | Status: DC

## 2022-02-28 NOTE — Progress Notes (Signed)
Nursing Pain Medication Assessment:  ?Safety precautions to be maintained throughout the outpatient stay will include: orient to surroundings, keep bed in low position, maintain call bell within reach at all times, provide assistance with transfer out of bed and ambulation.  ?Medication Inspection Compliance: Pill count conducted under aseptic conditions, in front of the patient. Neither the pills nor the bottle was removed from the patient's sight at any time. Once count was completed pills were immediately returned to the patient in their original bottle. ? ?Medication: Oxycodone/APAP ?Pill/Patch Count:  0 of 90 pills remain ?Pill/Patch Appearance:  no pills left ?Bottle Appearance: Standard pharmacy container. Clearly labeled. ?Filled Date: 02 / 18 / 2023 ?Last Medication intake:  Today ?

## 2022-02-28 NOTE — Progress Notes (Signed)
PROVIDER NOTE: Information contained herein reflects review and annotations entered in association with encounter. Interpretation of such information and data should be left to medically-trained personnel. Information provided to patient can be located elsewhere in the medical record under "Patient Instructions". Document created using STT-dictation technology, any transcriptional errors that may result from process are unintentional.  ?  ?Patient: Logan Vazquez  Service Category: E/M  Provider: Gillis Santa, MD  ?DOB: 11-16-1946  DOS: 02/28/2022  Specialty: Interventional Pain Management  ?MRN: [id number]  Setting: Ambulatory outpatient  PCP: Letta Median, MD  ?Type: Established Patient    Referring Provider: Letta Median, MD  ?Location: Office  Delivery: Face-to-face    ? ?HPI  ?[name], a 76 y.o. year old male, is here today because of his Spinal stenosis of lumbar region, unspecified whether neurogenic claudication present [M48.061]. [name] primary complain today is Hip Pain (Bilateral ), Leg Pain (Bilateral ), and Back Pain (Lumbar bilateral ) ? ?Last encounter: My last encounter with him was on 12/04/21 ? ?Pertinent problems: Logan Vazquez has Anxiety disorder; Atherosclerotic heart disease of native coronary artery without angina pectoris; History of lumbar spinal fusion; Lumbar degenerative disc disease; Recurrent major depressive disorder, in partial remission (Logan Vazquez); Chronic pain syndrome; Long term current use of opiate analgesic; Chronic bilateral low back pain with bilateral sciatica; Chronic hip pain, left; and Spinal stenosis of lumbar region on their pertinent problem list. ?Pain Assessment: Severity of Chronic pain is reported as a 10-Worst pain ever/10. Location: Back (see visit info for additional sites.) Lower, Left, Right/? back pain into hips and legs. Onset: More than a month ago. Quality: Burning, Constant, Discomfort. Timing: Constant. Modifying factor(s):  medications are not working very well.  is having to take additional medicine than what is prescribed and states this does help. ?Vitals:  height is 5' 9"  (1.753 m) and weight is 108 lb (49 kg). His temporal temperature is 97.2 ?F (36.2 ?C) (abnormal). His blood pressure is 160/75 (abnormal) and his pulse is 67. His respiration is 16 and oxygen saturation is 98%.  ? ?Reason for encounter: medication management.   ? ?No change in medical history since last visit other than a fall a couple of days ago, he is getting better from it.  Patient continues multimodal pain regimen as prescribed.  States that it provides pain relief and improvement in functional status. ?Complaining of increased lumbar paraspinal muscle spasms and muscle tension.  Recommend increasing his tizanidine as below and also recommend increasing his Lyrica by adding a daytime dose at 75 mg. ? ?Pharmacotherapy Assessment  ?Analgesic: Percocet 10 mg 3 times daily as needed, quantity 90/month; MME equals 45   ? ?Monitoring: ?Logan Vazquez PMP: PDMP reviewed during this encounter.       ?Pharmacotherapy: No side-effects or adverse reactions reported. ?Compliance: No problems identified. ?Effectiveness: Clinically acceptable.  UDS:  ?Summary  ?Date Value Ref Range Status  ?01/05/2019 FINAL  Final  ?  Comment:  ?  ==================================================================== ?[facility] ([facility]) ?==================================================================== ?Test                             Result       Flag       Units ?Drug Present and Declared for Prescription Verification ?  Oxycodone                      1254  EXPECTED   ng/mg creat ?  Oxymorphone                    310          EXPECTED   ng/mg creat ?  Noroxycodone                   4592         EXPECTED   ng/mg creat ?  Noroxymorphone                 247          EXPECTED   ng/mg creat ?   Sources of oxycodone are scheduled prescription medications. ?   Oxymorphone, noroxycodone, and  noroxymorphone are expected ?   metabolites of oxycodone. Oxymorphone is also available as a ?   scheduled prescription medication. ?Drug Present not Declared for Prescription Verification ?  Alprazolam                     15           UNEXPECTED ng/mg creat ?  Alpha-hydroxyalprazolam        19           UNEXPECTED ng/mg creat ?   Source of alprazolam is a scheduled prescription medication. ?   Alpha-hydroxyalprazolam is an expected metabolite of alprazolam. ?==================================================================== ?Test                      Result    Flag   Units      Ref Range ?  Creatinine              150              mg/dL      >=20 ?==================================================================== ?Declared Medications: ? The flagging and interpretation on this report are based on the ? following declared medications.  Unexpected results may arise from ? inaccuracies in the declared medications. ? **Note: The testing scope of this panel includes these medications: ? Oxycodone (Oxycodone Acetaminophen) ? **Note: The testing scope of this panel does not include following ? reported medications: ? Acetaminophen (Oxycodone Acetaminophen) ? Albuterol ? Aspirin (Aspirin 81) ? Atorvastatin ? Baclofen ? Celecoxib ? Cholecalciferol ? Cyanocobalamin ? Docusate ? Fluoxetine ? Hydrocortisone ? Lisinopril ? Mupirocin ? Nitroglycerin ? Pregabalin ?==================================================================== ?For clinical consultation, please call [phone]. ?==================================================================== ?  ?  ? ? ?ROS  ?Constitutional: Denies any fever or chills ?Gastrointestinal: No reported hemesis, hematochezia, vomiting, or acute GI distress ?Musculoskeletal:  Cervical, thoracic, lumbar pain, with radiation down to right posterior lateral calf ?Neurological: No reported episodes of acute onset apraxia, aphasia, dysarthria, agnosia, amnesia, paralysis, loss of  coordination, or loss of consciousness ? ?Medication Review  ?B-12, FLUoxetine, albuterol, amLODipine, aspirin EC, atorvastatin, busPIRone, docusate sodium, hydrocortisone, lisinopril, loperamide, mupirocin cream, nitroGLYCERIN, omeprazole, oxyCODONE-acetaminophen, pregabalin, and tiZANidine ? ?History Review  ?Allergy: Logan Vazquez is allergic to ibuprofen. ?Drug: Logan Vazquez  reports no history of drug use. ?Alcohol:  reports no history of alcohol use. ?Tobacco:  reports that he has been smoking cigarettes. He has a 29.00 pack-year smoking history. He has never used smokeless tobacco. ?Social: Logan Vazquez  reports that he has been smoking cigarettes. He has a 29.00 pack-year smoking history. He has never used smokeless tobacco. He reports that he does not drink alcohol and does not use drugs. ?Medical:  has a past medical history of Arthritis, Asthma, CAD (  coronary artery disease), Depression, Emphysema lung ([place]), Glaucoma, and Hypertension. ?Surgical: Logan Vazquez  has a past surgical history that includes heart surgery (07/1999); Hip surgery (Left, 1968); Back surgery (1985); Skin graft (1968); and Humerus fracture surgery (1968). ?Family: family history includes Cancer in his father; Hearing loss in his mother; Heart attack in his brother; Heart disease in his brother; Hypertension in his father and mother; Stroke in his sister. ? ?Laboratory Chemistry Profile  ? ?Renal ?Lab Results  ?Component Value Date  ? BUN 15 10/23/2017  ? CREATININE 0.92 10/23/2017  ? BCR NOT APPLICABLE 53/97/6734  ? ?  Hepatic ?Lab Results  ?Component Value Date  ? AST 14 10/23/2017  ? ALT 10 10/23/2017  ? ?  ?Electrolytes ?Lab Results  ?Component Value Date  ? NA 138 10/23/2017  ? K 4.9 10/23/2017  ? CL 102 10/23/2017  ? CALCIUM 9.5 10/23/2017  ? ?  Bone ?No results found for: [place], [id number], [id number], [id number], 25OHVITD1, 25OHVITD2, 25OHVITD3, TESTOFREE, TESTOSTERONE ?  ?Inflammation (CRP: Acute Phase) (ESR: Chronic Phase) ?No  results found for: CRP, ESRSEDRATE, LATICACIDVEN ?    ?Note: Above Lab results reviewed. ? ? ?Physical Exam  ?General appearance: Well nourished, well developed, and well hydrated. In no apparent acute distress ?Mental stat

## 2022-03-11 ENCOUNTER — Telehealth: Payer: Self-pay | Admitting: Student in an Organized Health Care Education/Training Program

## 2022-03-11 NOTE — Telephone Encounter (Signed)
Patient wants to speak with a nurse about his meds. Says they are not helping, was something changed? Please call  ?

## 2022-03-11 NOTE — Telephone Encounter (Signed)
Patient reassurred that the medication is the same that has been prescribed previously, there has been no change. Offered to schedule appt. , patient states he needs to secure transportation, then will call for appt. ?

## 2022-03-13 ENCOUNTER — Observation Stay: Payer: Medicare Other

## 2022-03-13 ENCOUNTER — Emergency Department: Payer: Medicare Other

## 2022-03-13 ENCOUNTER — Inpatient Hospital Stay
Admission: EM | Admit: 2022-03-13 | Discharge: 2022-03-26 | DRG: 982 | Disposition: A | Discharge status: Home Health | Payer: Medicare Other | Attending: Internal Medicine | Admitting: Internal Medicine

## 2022-03-13 ENCOUNTER — Other Ambulatory Visit: Payer: Self-pay

## 2022-03-13 DIAGNOSIS — R55 Syncope and collapse: Secondary | ICD-10-CM | POA: Diagnosis present

## 2022-03-13 DIAGNOSIS — E785 Hyperlipidemia, unspecified: Secondary | ICD-10-CM | POA: Diagnosis present

## 2022-03-13 DIAGNOSIS — G8929 Other chronic pain: Secondary | ICD-10-CM | POA: Diagnosis present

## 2022-03-13 DIAGNOSIS — I714 Abdominal aortic aneurysm, without rupture, unspecified: Secondary | ICD-10-CM

## 2022-03-13 DIAGNOSIS — N179 Acute kidney failure, unspecified: Secondary | ICD-10-CM | POA: Diagnosis present

## 2022-03-13 DIAGNOSIS — Y92009 Unspecified place in unspecified non-institutional (private) residence as the place of occurrence of the external cause: Secondary | ICD-10-CM | POA: Diagnosis not present

## 2022-03-13 DIAGNOSIS — H409 Unspecified glaucoma: Secondary | ICD-10-CM | POA: Diagnosis present

## 2022-03-13 DIAGNOSIS — Z681 Body mass index (BMI) 19 or less, adult: Secondary | ICD-10-CM

## 2022-03-13 DIAGNOSIS — Z7982 Long term (current) use of aspirin: Secondary | ICD-10-CM

## 2022-03-13 DIAGNOSIS — M6282 Rhabdomyolysis: Secondary | ICD-10-CM | POA: Diagnosis present

## 2022-03-13 DIAGNOSIS — Z01810 Encounter for preprocedural cardiovascular examination: Secondary | ICD-10-CM

## 2022-03-13 DIAGNOSIS — F32A Depression, unspecified: Secondary | ICD-10-CM | POA: Diagnosis present

## 2022-03-13 DIAGNOSIS — Z79899 Other long term (current) drug therapy: Secondary | ICD-10-CM

## 2022-03-13 DIAGNOSIS — K219 Gastro-esophageal reflux disease without esophagitis: Secondary | ICD-10-CM | POA: Diagnosis present

## 2022-03-13 DIAGNOSIS — Z95828 Presence of other vascular implants and grafts: Secondary | ICD-10-CM

## 2022-03-13 DIAGNOSIS — G894 Chronic pain syndrome: Secondary | ICD-10-CM | POA: Diagnosis present

## 2022-03-13 DIAGNOSIS — Z781 Physical restraint status: Secondary | ICD-10-CM

## 2022-03-13 DIAGNOSIS — I10 Essential (primary) hypertension: Secondary | ICD-10-CM | POA: Diagnosis present

## 2022-03-13 DIAGNOSIS — Z8249 Family history of ischemic heart disease and other diseases of the circulatory system: Secondary | ICD-10-CM

## 2022-03-13 DIAGNOSIS — J449 Chronic obstructive pulmonary disease, unspecified: Secondary | ICD-10-CM | POA: Diagnosis present

## 2022-03-13 DIAGNOSIS — E860 Dehydration: Secondary | ICD-10-CM | POA: Diagnosis present

## 2022-03-13 DIAGNOSIS — I7143 Infrarenal abdominal aortic aneurysm, without rupture: Secondary | ICD-10-CM | POA: Diagnosis present

## 2022-03-13 DIAGNOSIS — R197 Diarrhea, unspecified: Secondary | ICD-10-CM | POA: Diagnosis not present

## 2022-03-13 DIAGNOSIS — J439 Emphysema, unspecified: Secondary | ICD-10-CM | POA: Diagnosis present

## 2022-03-13 DIAGNOSIS — T796XXA Traumatic ischemia of muscle, initial encounter: Principal | ICD-10-CM | POA: Diagnosis present

## 2022-03-13 DIAGNOSIS — Z9049 Acquired absence of other specified parts of digestive tract: Secondary | ICD-10-CM

## 2022-03-13 DIAGNOSIS — G4700 Insomnia, unspecified: Secondary | ICD-10-CM | POA: Diagnosis not present

## 2022-03-13 DIAGNOSIS — D649 Anemia, unspecified: Secondary | ICD-10-CM | POA: Diagnosis present

## 2022-03-13 DIAGNOSIS — R531 Weakness: Secondary | ICD-10-CM

## 2022-03-13 DIAGNOSIS — F17210 Nicotine dependence, cigarettes, uncomplicated: Secondary | ICD-10-CM | POA: Diagnosis present

## 2022-03-13 DIAGNOSIS — F419 Anxiety disorder, unspecified: Secondary | ICD-10-CM | POA: Diagnosis present

## 2022-03-13 DIAGNOSIS — N3289 Other specified disorders of bladder: Secondary | ICD-10-CM | POA: Diagnosis not present

## 2022-03-13 DIAGNOSIS — R4182 Altered mental status, unspecified: Secondary | ICD-10-CM | POA: Diagnosis not present

## 2022-03-13 DIAGNOSIS — M160 Bilateral primary osteoarthritis of hip: Secondary | ICD-10-CM | POA: Diagnosis present

## 2022-03-13 DIAGNOSIS — E876 Hypokalemia: Secondary | ICD-10-CM | POA: Diagnosis not present

## 2022-03-13 DIAGNOSIS — R296 Repeated falls: Secondary | ICD-10-CM | POA: Diagnosis present

## 2022-03-13 DIAGNOSIS — Z888 Allergy status to other drugs, medicaments and biological substances status: Secondary | ICD-10-CM

## 2022-03-13 DIAGNOSIS — W19XXXA Unspecified fall, initial encounter: Secondary | ICD-10-CM | POA: Diagnosis not present

## 2022-03-13 DIAGNOSIS — W06XXXA Fall from bed, initial encounter: Secondary | ICD-10-CM | POA: Diagnosis present

## 2022-03-13 DIAGNOSIS — Z955 Presence of coronary angioplasty implant and graft: Secondary | ICD-10-CM

## 2022-03-13 DIAGNOSIS — R64 Cachexia: Secondary | ICD-10-CM | POA: Diagnosis present

## 2022-03-13 DIAGNOSIS — I2510 Atherosclerotic heart disease of native coronary artery without angina pectoris: Secondary | ICD-10-CM | POA: Diagnosis present

## 2022-03-13 LAB — CBC WITH DIFFERENTIAL/PLATELET
Abs Immature Granulocytes: 0.07 10*3/uL (ref 0.00–0.07)
Basophils Absolute: 0.1 10*3/uL (ref 0.0–0.1)
Basophils Relative: 0 %
Eosinophils Absolute: 0 10*3/uL (ref 0.0–0.5)
Eosinophils Relative: 0 %
HCT: 34.4 % — ABNORMAL LOW (ref 39.0–52.0)
Hemoglobin: 10.8 g/dL — ABNORMAL LOW (ref 13.0–17.0)
Immature Granulocytes: 1 %
Lymphocytes Relative: 8 %
Lymphs Abs: 1 10*3/uL (ref 0.7–4.0)
MCH: 29.5 pg (ref 26.0–34.0)
MCHC: 31.4 g/dL (ref 30.0–36.0)
MCV: 94 fL (ref 80.0–100.0)
Monocytes Absolute: 0.7 10*3/uL (ref 0.1–1.0)
Monocytes Relative: 6 %
Neutro Abs: 10.7 10*3/uL — ABNORMAL HIGH (ref 1.7–7.7)
Neutrophils Relative %: 85 %
Platelets: 276 10*3/uL (ref 150–400)
RBC: 3.66 MIL/uL — ABNORMAL LOW (ref 4.22–5.81)
RDW: 14.9 % (ref 11.5–15.5)
WBC: 12.6 10*3/uL — ABNORMAL HIGH (ref 4.0–10.5)
nRBC: 0 % (ref 0.0–0.2)

## 2022-03-13 LAB — COMPREHENSIVE METABOLIC PANEL
ALT: 19 U/L (ref 0–44)
AST: 41 U/L (ref 15–41)
Albumin: 3.7 g/dL (ref 3.5–5.0)
Alkaline Phosphatase: 86 U/L (ref 38–126)
Anion gap: 11 (ref 5–15)
BUN: 43 mg/dL — ABNORMAL HIGH (ref 8–23)
CO2: 22 mmol/L (ref 22–32)
Calcium: 8.1 mg/dL — ABNORMAL LOW (ref 8.9–10.3)
Chloride: 104 mmol/L (ref 98–111)
Creatinine, Ser: 2.05 mg/dL — ABNORMAL HIGH (ref 0.61–1.24)
GFR, Estimated: 33 mL/min — ABNORMAL LOW (ref >60)
Glucose, Bld: 88 mg/dL (ref 70–99)
Potassium: 4.2 mmol/L (ref 3.5–5.1)
Sodium: 137 mmol/L (ref 135–145)
Total Bilirubin: 1.1 mg/dL (ref 0.3–1.2)
Total Protein: 6.7 g/dL (ref 6.5–8.1)

## 2022-03-13 LAB — URINALYSIS, ROUTINE W REFLEX MICROSCOPIC
Bacteria, UA: NONE SEEN
Glucose, UA: NEGATIVE mg/dL
Ketones, ur: 5 mg/dL — AB
Leukocytes,Ua: NEGATIVE
Nitrite: NEGATIVE
Protein, ur: 30 mg/dL — AB
Specific Gravity, Urine: 1.018 (ref 1.005–1.030)
pH: 5 (ref 5.0–8.0)

## 2022-03-13 LAB — CBC
HCT: 32.9 % — ABNORMAL LOW (ref 39.0–52.0)
Hemoglobin: 10.5 g/dL — ABNORMAL LOW (ref 13.0–17.0)
MCH: 29.7 pg (ref 26.0–34.0)
MCHC: 31.9 g/dL (ref 30.0–36.0)
MCV: 92.9 fL (ref 80.0–100.0)
Platelets: 259 10*3/uL (ref 150–400)
RBC: 3.54 MIL/uL — ABNORMAL LOW (ref 4.22–5.81)
RDW: 14.8 % (ref 11.5–15.5)
WBC: 11.9 10*3/uL — ABNORMAL HIGH (ref 4.0–10.5)
nRBC: 0 % (ref 0.0–0.2)

## 2022-03-13 LAB — CREATININE, SERUM
Creatinine, Ser: 1.52 mg/dL — ABNORMAL HIGH (ref 0.61–1.24)
GFR, Estimated: 47 mL/min — ABNORMAL LOW (ref >60)

## 2022-03-13 LAB — CK: Total CK: 1690 U/L — ABNORMAL HIGH (ref 49–397)

## 2022-03-13 MED ORDER — OXYCODONE-ACETAMINOPHEN 10-325 MG PO TABS: 1.0000 | ORAL_TABLET | Freq: Four times a day (QID) | ORAL | Status: DC | PRN

## 2022-03-13 MED ORDER — HYDRALAZINE HCL 20 MG/ML IJ SOLN: 20.0000 mg | Freq: Four times a day (QID) | INTRAMUSCULAR | Status: DC | PRN

## 2022-03-13 MED ORDER — FENTANYL CITRATE PF 50 MCG/ML IJ SOSY
50.0000 ug | PREFILLED_SYRINGE | Freq: Once | INTRAMUSCULAR | Status: AC
  Administered 2022-03-13: 50 ug via INTRAVENOUS
  Filled 2022-03-13: qty 1

## 2022-03-13 MED ORDER — LACTATED RINGERS IV BOLUS
1000.0000 mL | Freq: Once | INTRAVENOUS | Status: AC
  Administered 2022-03-13: 1000 mL via INTRAVENOUS

## 2022-03-13 MED ORDER — ASPIRIN EC 81 MG PO TBEC
81.0000 mg | DELAYED_RELEASE_TABLET | Freq: Every day | ORAL | Status: DC
  Administered 2022-03-14 – 2022-03-26 (×11): 81 mg via ORAL
  Filled 2022-03-13 (×11): qty 1

## 2022-03-13 MED ORDER — MORPHINE SULFATE (PF) 2 MG/ML IV SOLN
2.0000 mg | Freq: Once | INTRAVENOUS | Status: AC
  Administered 2022-03-13: 2 mg via INTRAVENOUS
  Filled 2022-03-13: qty 1

## 2022-03-13 MED ORDER — ALBUTEROL SULFATE (2.5 MG/3ML) 0.083% IN NEBU: 3.0000 mL | INHALATION_SOLUTION | Freq: Four times a day (QID) | RESPIRATORY_TRACT | Status: DC | PRN

## 2022-03-13 MED ORDER — HEPARIN SODIUM (PORCINE) 5000 UNIT/ML IJ SOLN
5000.0000 [IU] | Freq: Three times a day (TID) | INTRAMUSCULAR | Status: DC
  Administered 2022-03-13 – 2022-03-21 (×21): 5000 [IU] via SUBCUTANEOUS
  Filled 2022-03-13 (×21): qty 1

## 2022-03-13 MED ORDER — HYDROMORPHONE HCL 2 MG PO TABS
1.0000 mg | ORAL_TABLET | ORAL | Status: DC | PRN
  Administered 2022-03-13 – 2022-03-14 (×4): 1 mg via ORAL
  Filled 2022-03-13 (×4): qty 1

## 2022-03-13 MED ORDER — BUSPIRONE HCL 10 MG PO TABS
15.0000 mg | ORAL_TABLET | Freq: Two times a day (BID) | ORAL | Status: DC
  Administered 2022-03-13 – 2022-03-26 (×23): 15 mg via ORAL
  Filled 2022-03-13: qty 2
  Filled 2022-03-13: qty 3
  Filled 2022-03-13 (×11): qty 2
  Filled 2022-03-13: qty 3
  Filled 2022-03-13 (×9): qty 2

## 2022-03-13 MED ORDER — AMLODIPINE BESYLATE 10 MG PO TABS
10.0000 mg | ORAL_TABLET | Freq: Every day | ORAL | Status: DC
  Administered 2022-03-14 – 2022-03-26 (×10): 10 mg via ORAL
  Filled 2022-03-13: qty 2
  Filled 2022-03-13 (×10): qty 1

## 2022-03-13 MED ORDER — SODIUM CHLORIDE 0.9 % IV SOLN: INTRAVENOUS | Status: AC

## 2022-03-13 MED ORDER — NITROGLYCERIN 0.4 MG SL SUBL: 0.4000 mg | SUBLINGUAL_TABLET | SUBLINGUAL | Status: DC | PRN

## 2022-03-13 MED ORDER — OXYCODONE-ACETAMINOPHEN 5-325 MG PO TABS
1.0000 | ORAL_TABLET | Freq: Four times a day (QID) | ORAL | Status: DC | PRN
  Administered 2022-03-20 – 2022-03-22 (×7): 1 via ORAL
  Filled 2022-03-13 (×7): qty 1

## 2022-03-13 MED ORDER — FLUOXETINE HCL 20 MG PO CAPS
20.0000 mg | ORAL_CAPSULE | Freq: Every day | ORAL | Status: DC
  Administered 2022-03-14: 20 mg via ORAL
  Filled 2022-03-13 (×2): qty 1

## 2022-03-13 MED ORDER — OXYCODONE HCL 5 MG PO TABS
5.0000 mg | ORAL_TABLET | Freq: Four times a day (QID) | ORAL | Status: DC | PRN
  Administered 2022-03-13 – 2022-03-19 (×2): 5 mg via ORAL
  Filled 2022-03-13 (×2): qty 1

## 2022-03-13 NOTE — ED Triage Notes (Signed)
Pt states he thinks he rolled off the cough around 11pm last night. Pt was found on the floor by his home health nurse around 2:15pm. PT was/is altered. Pt had 4 falls yesterday alone. Pt states his legs got weak and gave way. ?

## 2022-03-13 NOTE — ED Provider Notes (Signed)
? ?[facility] ?Provider Note ? ? ? Event Date/Time  ? First MD Initiated Contact with Patient 03/13/22 1518   ?  (approximate) ? ? ?History  ? ?Fall and Altered Mental Status ? ? ?HPI ? ?Logan Vazquez is a 76 y.o. male who presents to the ED for evaluation of Fall and Altered Mental Status ?  ?I reviewed outpatient pain visit from 3/16.  History of spinal stenosis, anxiety, chronic pain syndrome. ? ?Patient presents to the ED for evaluation of a fall that occurred last night.  He reports prior to midnight he was on the couch and accidentally slipped and fell off the couch onto a carpeted surface.  Reports he was unable to get up, and "just laid there" until and in-home caregiver/home health came by around lunchtime today.  Greater than 12 hours on the floor.  He reports acute on chronic pain to his left hip and right upper arm.  Denies any syncopal episodes, chest pain or headache. ? ? ?Physical Exam  ? ?Triage Vital Signs: ?ED Triage Vitals  ?Enc Vitals Group  ?   BP 03/13/22 1530 106/70  ?   Pulse Rate 03/13/22 1530 85  ?   Resp 03/13/22 1530 11  ?   Temp --   ?   Temp src --   ?   SpO2 03/13/22 1514 94 %  ?   Weight 03/13/22 1519 126 lb (57.2 kg)  ?   Height 03/13/22 1519 5\' 9"  (1.753 m)  ?   Head Circumference --   ?   Peak Flow --   ?   Pain Score --   ?   Pain Loc --   ?   Pain Edu? --   ?   Excl. in GC? --   ? ? ?Most recent vital signs: ?Vitals:  ? 03/13/22 1700 03/13/22 1913  ?BP: 104/65 116/68  ?Pulse: 83 97  ?Resp: 20 20  ?Temp:  97.9 ?F (36.6 ?C)  ?SpO2: 93% 92%  ? ? ?General: Awake, no distress.  Pleasant and conversational in full sentences. ?CV:  Good peripheral perfusion.  ?Resp:  Normal effort.  ?Abd:  No distention.  ?MSK:  No deformity noted.  Full active and passive ROM of all 4 extremities.  Reports pain with ranging of his left hip and right arm.  No signs of trauma to the back. ?Neuro:  No focal deficits appreciated. ?Other:   ? ? ?ED Results / Procedures / Treatments   ? ?Labs ?(all labs ordered are listed, but only abnormal results are displayed) ?Labs Reviewed  ?CK - Abnormal; Notable for the following components:  ?    Result Value  ? Total CK 1,690 (*)   ? All other components within normal limits  ?CBC WITH DIFFERENTIAL/PLATELET - Abnormal; Notable for the following components:  ? WBC 12.6 (*)   ? RBC 3.66 (*)   ? Hemoglobin 10.8 (*)   ? HCT 34.4 (*)   ? Neutro Abs 10.7 (*)   ? All other components within normal limits  ?COMPREHENSIVE METABOLIC PANEL - Abnormal; Notable for the following components:  ? BUN 43 (*)   ? Creatinine, Ser 2.05 (*)   ? Calcium 8.1 (*)   ? GFR, Estimated 33 (*)   ? All other components within normal limits  ?URINALYSIS, ROUTINE W REFLEX MICROSCOPIC - Abnormal; Notable for the following components:  ? Color, Urine YELLOW (*)   ? APPearance CLEAR (*)   ? Hgb urine  dipstick SMALL (*)   ? Bilirubin Urine SMALL (*)   ? Ketones, ur 5 (*)   ? Protein, ur 30 (*)   ? All other components within normal limits  ?CBC - Abnormal; Notable for the following components:  ? WBC 11.9 (*)   ? RBC 3.54 (*)   ? Hemoglobin 10.5 (*)   ? HCT 32.9 (*)   ? All other components within normal limits  ?CREATININE, SERUM - Abnormal; Notable for the following components:  ? Creatinine, Ser 1.52 (*)   ? GFR, Estimated 47 (*)   ? All other components within normal limits  ? ? ?EKG ?Sinus rhythm, rate of 88 bpm.  Normal axis and intervals.  Nonspecific ST changes laterally without STEMI. ? ?RADIOLOGY ?CT head and neck reviewed by me without evidence of acute intracranial pathology, cervical fracture.   ?Plain films of the pelvis, left hip and right arm reviewed by me without evidence of fracture or dislocation ? ?Official radiology report(s): ?CT HEAD WO CONTRAST ( ) ? ?Result Date: 03/13/2022 ?CLINICAL DATA:  Fall.  Found down. EXAM: CT HEAD WITHOUT CONTRAST CT CERVICAL SPINE WITHOUT CONTRAST TECHNIQUE: Multidetector CT imaging of the head and cervical spine was performed  following the standard protocol without intravenous contrast. Multiplanar CT image reconstructions of the cervical spine were also generated. RADIATION DOSE REDUCTION: This exam was performed according to the departmental dose-optimization program which includes automated exposure control, adjustment of the mA and/or kV according to patient size and/or use of iterative reconstruction technique. COMPARISON:  None. FINDINGS: CT HEAD FINDINGS Brain: Mild atrophy. Negative for hydrocephalus. Moderate patchy white matter hypodensity bilaterally. Negative for acute infarct, hemorrhage, mass Vascular: Atherosclerotic calcification distal left vertebral artery and carotid artery bilaterally. Negative for hyperdense vessel Skull: Negative Sinuses/Orbits: Paranasal sinuses clear.  Normal orbit Other: None CT CERVICAL SPINE FINDINGS Alignment: Normal Skull base and vertebrae: Negative for acute fracture. Chronic compression fracture C7 unchanged from prior studies. Soft tissues and spinal canal: Negative for soft tissue mass or adenopathy. Atherosclerotic calcification in the carotid artery bilaterally. Disc levels: Multilevel disc and facet degeneration. Mild spinal in foraminal stenosis C5-6 and C6-7. Upper chest: Lung apices clear bilaterally Other: None IMPRESSION: 1. No acute intracranial abnormality. Atrophy and moderate chronic microvascular ischemic change 2. Chronic compression fracture of C7.  No acute cervical fracture. Electronically Signed   By: Marlan Palau M.D.   On: 03/13/2022 15:50  ? ?CT Cervical Spine Wo Contrast ? ?Result Date: 03/13/2022 ?CLINICAL DATA:  Fall.  Found down. EXAM: CT HEAD WITHOUT CONTRAST CT CERVICAL SPINE WITHOUT CONTRAST TECHNIQUE: Multidetector CT imaging of the head and cervical spine was performed following the standard protocol without intravenous contrast. Multiplanar CT image reconstructions of the cervical spine were also generated. RADIATION DOSE REDUCTION: This exam was performed  according to the departmental dose-optimization program which includes automated exposure control, adjustment of the mA and/or kV according to patient size and/or use of iterative reconstruction technique. COMPARISON:  None. FINDINGS: CT HEAD FINDINGS Brain: Mild atrophy. Negative for hydrocephalus. Moderate patchy white matter hypodensity bilaterally. Negative for acute infarct, hemorrhage, mass Vascular: Atherosclerotic calcification distal left vertebral artery and carotid artery bilaterally. Negative for hyperdense vessel Skull: Negative Sinuses/Orbits: Paranasal sinuses clear.  Normal orbit Other: None CT CERVICAL SPINE FINDINGS Alignment: Normal Skull base and vertebrae: Negative for acute fracture. Chronic compression fracture C7 unchanged from prior studies. Soft tissues and spinal canal: Negative for soft tissue mass or adenopathy. Atherosclerotic calcification in the carotid artery bilaterally. Disc  levels: Multilevel disc and facet degeneration. Mild spinal in foraminal stenosis C5-6 and C6-7. Upper chest: Lung apices clear bilaterally Other: None IMPRESSION: 1. No acute intracranial abnormality. Atrophy and moderate chronic microvascular ischemic change 2. Chronic compression fracture of C7.  No acute cervical fracture. Electronically Signed   By: Marlan Palau M.D.   On: 03/13/2022 15:50  ? ?CT HIP LEFT WO CONTRAST ? ?Result Date: 03/13/2022 ?CLINICAL DATA:  Larey Seat. Left hip pain. Remote history of left femur fracture. EXAM: CT OF THE LEFT HIP WITHOUT CONTRAST TECHNIQUE: Multidetector CT imaging of the left hip was performed according to the standard protocol. Multiplanar CT image reconstructions were also generated. RADIATION DOSE REDUCTION: This exam was performed according to the departmental dose-optimization program which includes automated exposure control, adjustment of the mA and/or kV according to patient size and/or use of iterative reconstruction technique. COMPARISON:  Radiographs, same date.  FINDINGS: The left hip is normally located. No evidence of acute left hip fracture. Remote posttraumatic changes involving the left femur. No evidence of acute upper femur fracture. The visualized left hemipe

## 2022-03-13 NOTE — ED Notes (Signed)
Pt provided with sandwich tray and diet coke. Ate sandwich without difficulty. Lights dimmed per pt request. Remote provided for pt to watch TV. ?
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
## 2022-03-14 ENCOUNTER — Inpatient Hospital Stay: Payer: Medicare Other

## 2022-03-14 ENCOUNTER — Observation Stay: Payer: Medicare Other

## 2022-03-14 ENCOUNTER — Observation Stay
Admit: 2022-03-14 | Discharge: 2022-03-14 | Disposition: A | Discharge status: Home / Self Care | Payer: Medicare Other | Attending: Internal Medicine | Admitting: Internal Medicine

## 2022-03-14 DIAGNOSIS — Y92009 Unspecified place in unspecified non-institutional (private) residence as the place of occurrence of the external cause: Secondary | ICD-10-CM

## 2022-03-14 DIAGNOSIS — D649 Anemia, unspecified: Secondary | ICD-10-CM | POA: Diagnosis present

## 2022-03-14 DIAGNOSIS — W19XXXA Unspecified fall, initial encounter: Secondary | ICD-10-CM

## 2022-03-14 DIAGNOSIS — Z79899 Other long term (current) drug therapy: Secondary | ICD-10-CM | POA: Diagnosis not present

## 2022-03-14 DIAGNOSIS — M2550 Pain in unspecified joint: Secondary | ICD-10-CM | POA: Diagnosis not present

## 2022-03-14 DIAGNOSIS — R4182 Altered mental status, unspecified: Secondary | ICD-10-CM | POA: Diagnosis not present

## 2022-03-14 DIAGNOSIS — R64 Cachexia: Secondary | ICD-10-CM | POA: Diagnosis present

## 2022-03-14 DIAGNOSIS — R55 Syncope and collapse: Secondary | ICD-10-CM | POA: Diagnosis present

## 2022-03-14 DIAGNOSIS — F17210 Nicotine dependence, cigarettes, uncomplicated: Secondary | ICD-10-CM | POA: Diagnosis present

## 2022-03-14 DIAGNOSIS — Z888 Allergy status to other drugs, medicaments and biological substances status: Secondary | ICD-10-CM | POA: Diagnosis not present

## 2022-03-14 DIAGNOSIS — Z8249 Family history of ischemic heart disease and other diseases of the circulatory system: Secondary | ICD-10-CM | POA: Diagnosis not present

## 2022-03-14 DIAGNOSIS — F32A Depression, unspecified: Secondary | ICD-10-CM | POA: Diagnosis present

## 2022-03-14 DIAGNOSIS — N179 Acute kidney failure, unspecified: Secondary | ICD-10-CM | POA: Diagnosis present

## 2022-03-14 DIAGNOSIS — R296 Repeated falls: Secondary | ICD-10-CM | POA: Diagnosis present

## 2022-03-14 DIAGNOSIS — E785 Hyperlipidemia, unspecified: Secondary | ICD-10-CM | POA: Diagnosis present

## 2022-03-14 DIAGNOSIS — Z955 Presence of coronary angioplasty implant and graft: Secondary | ICD-10-CM | POA: Diagnosis not present

## 2022-03-14 DIAGNOSIS — I10 Essential (primary) hypertension: Secondary | ICD-10-CM

## 2022-03-14 DIAGNOSIS — I2510 Atherosclerotic heart disease of native coronary artery without angina pectoris: Secondary | ICD-10-CM | POA: Diagnosis present

## 2022-03-14 DIAGNOSIS — G894 Chronic pain syndrome: Secondary | ICD-10-CM | POA: Diagnosis present

## 2022-03-14 DIAGNOSIS — M160 Bilateral primary osteoarthritis of hip: Secondary | ICD-10-CM | POA: Diagnosis present

## 2022-03-14 DIAGNOSIS — I7143 Infrarenal abdominal aortic aneurysm, without rupture: Secondary | ICD-10-CM | POA: Diagnosis present

## 2022-03-14 DIAGNOSIS — Z681 Body mass index (BMI) 19 or less, adult: Secondary | ICD-10-CM | POA: Diagnosis not present

## 2022-03-14 DIAGNOSIS — E860 Dehydration: Secondary | ICD-10-CM | POA: Diagnosis present

## 2022-03-14 DIAGNOSIS — J439 Emphysema, unspecified: Secondary | ICD-10-CM | POA: Diagnosis present

## 2022-03-14 DIAGNOSIS — Z7982 Long term (current) use of aspirin: Secondary | ICD-10-CM | POA: Diagnosis not present

## 2022-03-14 DIAGNOSIS — T796XXA Traumatic ischemia of muscle, initial encounter: Secondary | ICD-10-CM | POA: Diagnosis present

## 2022-03-14 DIAGNOSIS — I714 Abdominal aortic aneurysm, without rupture, unspecified: Secondary | ICD-10-CM | POA: Diagnosis not present

## 2022-03-14 DIAGNOSIS — K219 Gastro-esophageal reflux disease without esophagitis: Secondary | ICD-10-CM | POA: Diagnosis present

## 2022-03-14 DIAGNOSIS — W06XXXA Fall from bed, initial encounter: Secondary | ICD-10-CM | POA: Diagnosis present

## 2022-03-14 DIAGNOSIS — H409 Unspecified glaucoma: Secondary | ICD-10-CM | POA: Diagnosis present

## 2022-03-14 DIAGNOSIS — F419 Anxiety disorder, unspecified: Secondary | ICD-10-CM | POA: Diagnosis present

## 2022-03-14 DIAGNOSIS — F5101 Primary insomnia: Secondary | ICD-10-CM | POA: Diagnosis not present

## 2022-03-14 LAB — BASIC METABOLIC PANEL
Anion gap: 6 (ref 5–15)
BUN: 23 mg/dL (ref 8–23)
CO2: 24 mmol/L (ref 22–32)
Calcium: 7.8 mg/dL — ABNORMAL LOW (ref 8.9–10.3)
Chloride: 107 mmol/L (ref 98–111)
Creatinine, Ser: 0.88 mg/dL (ref 0.61–1.24)
GFR, Estimated: >60 mL/min (ref >60)
Glucose, Bld: 128 mg/dL — ABNORMAL HIGH (ref 70–99)
Potassium: 3.7 mmol/L (ref 3.5–5.1)
Sodium: 137 mmol/L (ref 135–145)

## 2022-03-14 LAB — CK: Total CK: 1026 U/L — ABNORMAL HIGH (ref 49–397)

## 2022-03-14 MED ORDER — HALOPERIDOL LACTATE 5 MG/ML IJ SOLN: 2.5000 mg | Freq: Once | INTRAMUSCULAR | Status: DC

## 2022-03-14 MED ORDER — LORAZEPAM 2 MG/ML IJ SOLN
INTRAMUSCULAR | Status: AC
  Administered 2022-03-14: 2 mg via INTRAVENOUS
  Filled 2022-03-14: qty 1

## 2022-03-14 MED ORDER — LORAZEPAM 2 MG/ML IJ SOLN: 2.0000 mg | Freq: Once | INTRAMUSCULAR | Status: AC

## 2022-03-14 MED ORDER — HALOPERIDOL LACTATE 5 MG/ML IJ SOLN
INTRAMUSCULAR | Status: AC
  Filled 2022-03-14: qty 1

## 2022-03-14 MED ORDER — HALOPERIDOL LACTATE 5 MG/ML IJ SOLN
2.5000 mg | Freq: Once | INTRAMUSCULAR | Status: AC
  Administered 2022-03-14: 2.5 mg via INTRAMUSCULAR

## 2022-03-14 NOTE — ED Notes (Addendum)
Pt found up from bed with IV out. This RN attempted to redirect pt to bed. Pt states he wants to go to to Emergency room and tries to walk out door. This rn attempts to explain that he is in ER. Pt not cooperative and agitated. Jon Billings NP made aware. Haldol 2.5 mg ordered. Pt was told to get into bed so we could give him medicine. Pt got into bed and accepted medication.  ?

## 2022-03-14 NOTE — Assessment & Plan Note (Addendum)
Continue PT/OT

## 2022-03-14 NOTE — Assessment & Plan Note (Signed)
Cont ppi therapy.  ? ?

## 2022-03-14 NOTE — Assessment & Plan Note (Addendum)
Resolved

## 2022-03-14 NOTE — Assessment & Plan Note (Addendum)
Hemoglobin 9.5 postop.  Stable ? ? ?

## 2022-03-14 NOTE — ED Notes (Signed)
Pt given warm blanket and coffee to drink at this time ?

## 2022-03-14 NOTE — Assessment & Plan Note (Addendum)
Patient is treated with low dose metoprolol, heart rate and blood pressure stable.  Discontinued high-dose lisinopril. ? ?

## 2022-03-14 NOTE — Evaluation (Signed)
Physical Therapy Evaluation ?Patient Details ?Name: Logan Vazquez ?MRN: [id number] ?DOB: 1946-11-12 ?Today's Date: 03/14/2022 ? ?History of Present Illness ? 76 y.o. male with medical history significant of  CAD, copd, htn, seen in ed for fall pt fell off bed and was on ground for several hours (~12hrs?)  ?Clinical Impression ? Pt reports he has had multiple falls over the last year, reports now using walker all the time - rarely out of the home.  He showed good effort with mobility but needed assist to get to sitting and then, despite being able to stand and then ambulate ~75 ft w/o direct assist he showed poor awareness, poor posture, inconsistent cadence and generally did not show activity/mobility congruent with one who lives alone/does not have assist for days at a time.  Unsure how much sister (checking-in 3d/wk) could increase her visits but this PT did discuss the likely need for increased caregiver/supervision/etc QD moving forward in order for him to remain safe at home.   ? ?Recommendations for follow up therapy are one component of a multi-disciplinary discharge planning process, led by the attending physician.  Recommendations may be updated based on patient status, additional functional criteria and insurance authorization. ? ?Follow Up Recommendations Skilled nursing-short term rehab (<3 hours/day) ? ?  ?Assistance Recommended at Discharge Frequent or constant Supervision/Assistance  ?Patient can return home with the following ? A little help with walking and/or transfers;A little help with bathing/dressing/bathroom;Assistance with cooking/housework;Direct supervision/assist for medications management;Assist for transportation;Help with stairs or ramp for entrance ? ?  ?Equipment Recommendations None recommended by PT  ?Recommendations for Other Services ?    ?  ?Functional Status Assessment Patient has had a recent decline in their functional status and demonstrates the ability to make significant  improvements in function in a reasonable and predictable amount of time.  ? ?  ?Precautions / Restrictions Precautions ?Precautions: Fall ?Restrictions ?Weight Bearing Restrictions: No  ? ?  ? ?Mobility ? Bed Mobility ?Overal bed mobility: Needs Assistance ?Bed Mobility: Supine to Sit, Sit to Supine ?  ?  ?Supine to sit: Min assist ?Sit to supine: Min assist ?  ?General bed mobility comments: Pt able to do bulk of the transitions to/from supine but did need extra time, cuing and minimal direct assist to get to/from sitting ?  ? ?Transfers ?Overall transfer level: Needs assistance ?Equipment used: Rolling walker (2 wheels) ?Transfers: Sit to/from Stand ?Sit to Stand: Min guard ?  ?  ?  ?  ?  ?General transfer comment: From elevated surface pt was able to rise w/o direct assist.  he maintained poor, forward stooped posture with inabiltiy to correct.  Pt needed UEs on walker to maintain balance ?  ? ?Ambulation/Gait ?Ambulation/Gait assistance: Min guard ?Gait Distance (Feet): 75 Feet ?Assistive device: Rolling walker (2 wheels) ?  ?  ?  ?  ?General Gait Details: Pt fatigued relatively quickly with modest bout of abmulation; slow, shuffling gait with forward trunk lean and keeping walker too far from COG.  He did not have any overt LOBs but L LE typically trailing and needing consistent cuing and close CGA to insure safety.  Inconsistent cadence, weight shift and awareness. ? ?Stairs ?  ?  ?  ?  ?  ? ?Wheelchair Mobility ?  ? ?Modified Rankin (Stroke Patients Only) ?  ? ?  ? ?Balance Overall balance assessment: Needs assistance ?Sitting-balance support: Single extremity supported ?Sitting balance-Leahy Scale: Fair ?  ?  ?Standing balance support: Bilateral upper extremity  supported ?Standing balance-Leahy Scale: Fair ?Standing balance comment: forward flexed, light UE reliance on walker ?  ?  ?  ?  ?  ?  ?  ?  ?  ?  ?  ?   ? ? ? ?Pertinent Vitals/Pain Pain Assessment ?Pain Assessment: No/denies pain  ? ? ?Home Living  Family/patient expects to be discharged to:: Unsure ?Living Arrangements: Alone ?Available Help at Discharge:  (reports his sister stops ~3x/wk, sitter/PCA just recently started coming 1d/wk) ?  ?Home Access: Stairs to enter ?  ?Entrance Stairs-Number of Steps: 2 ?  ?Home Layout: One level ?Home Equipment: Agricultural consultant (2 wheels) ?   ?  ?Prior Function Prior Level of Function : Independent/Modified Independent ?  ?  ?  ?  ?  ?  ?Mobility Comments: appears he is rarely out of the home but manages alone for days at a time.  Pt reports consistent use of walker, at least 4 falls in the last 6 months ?  ?  ? ? ?Hand Dominance  ?   ? ?  ?Extremity/Trunk Assessment  ? Upper Extremity Assessment ?Upper Extremity Assessment: Overall WFL for tasks assessed;Generalized weakness ?  ? ?Lower Extremity Assessment ?Lower Extremity Assessment: Overall WFL for tasks assessed;Generalized weakness ?  ? ?   ?Communication  ? Communication: No difficulties  ?Cognition Arousal/Alertness: Awake/alert ?Behavior During Therapy: Impulsive ?Overall Cognitive Status: Difficult to assess ?  ?  ?  ?  ?  ?  ?  ?  ?  ?  ?  ?  ?  ?  ?  ?  ?General Comments: Pt verbose and able to hold conversation, knew date and situation but showed some decreased insight and awareness even with redirectional cuing ?  ?  ? ?  ?General Comments General comments (skin integrity, edema, etc.): Pt pleasant and willing to participate with PT, limited confidence and safety with basic mobility tasks ? ?  ?Exercises    ? ?Assessment/Plan  ?  ?PT Assessment Patient needs continued PT services  ?PT Problem List Decreased strength;Decreased range of motion;Decreased activity tolerance;Decreased balance;Decreased safety awareness;Decreased knowledge of use of DME;Decreased coordination;Decreased cognition;Decreased mobility ? ?   ?  ?PT Treatment Interventions DME instruction;Gait training;Stair training;Functional mobility training;Therapeutic activities;Therapeutic  exercise;Balance training;Cognitive remediation;Neuromuscular re-education;Patient/family education   ? ?PT Goals (Current goals can be found in the Care Plan section)  ?Acute Rehab PT Goals ?Patient Stated Goal: get strong enough to go home ?PT Goal Formulation: With patient ?Time For Goal Achievement: 03/28/22 ?Potential to Achieve Goals: Fair ? ?  ?Frequency Min 2X/week ?  ? ? ?Co-evaluation   ?  ?  ?  ?  ? ? ?  ?AM-PAC PT "6 Clicks" Mobility  ?Outcome Measure Help needed turning from your back to your side while in a flat bed without using bedrails?: A Little ?Help needed moving from lying on your back to sitting on the side of a flat bed without using bedrails?: A Little ?Help needed moving to and from a bed to a chair (including a wheelchair)?: A Little ?Help needed standing up from a chair using your arms (e.g., wheelchair or bedside chair)?: A Little ?Help needed to walk in hospital room?: A Lot ?Help needed climbing 3-5 steps with a railing? : Total ?6 Click Score: 15 ? ?  ?End of Session Equipment Utilized During Treatment: Gait belt ?Activity Tolerance: Patient limited by fatigue ?Patient left: in bed;with call bell/phone within reach ?Nurse Communication: Mobility status ?PT Visit Diagnosis: Muscle weakness (  generalized) (M62.81);Difficulty in walking, not elsewhere classified (R26.2);Unsteadiness on feet (R26.81) ?  ? ?Time: 5397-6734 ?PT Time Calculation (min) (ACUTE ONLY): 27 min ? ? ?Charges:   PT Evaluation ?$PT Eval Low Complexity: 1 Low ?PT Treatments ?$Therapeutic Activity: 8-22 mins ?  ?   ? ? ?Malachi Pro, DPT ?03/14/2022, 11:35 AM ? ?

## 2022-03-14 NOTE — Assessment & Plan Note (Addendum)
No exacerbation, stable

## 2022-03-14 NOTE — Progress Notes (Signed)
*  PRELIMINARY RESULTS* ?Echocardiogram ?2D Echocardiogram has been performed. ? ?Logan Vazquez ?03/14/2022, 1:19 PM ?

## 2022-03-14 NOTE — ED Notes (Signed)
Pts sister Samson Frederic [phone]) given update on pt with pts permission ?

## 2022-03-14 NOTE — ED Notes (Signed)
Dr Amery at bedside. 

## 2022-03-14 NOTE — Assessment & Plan Note (Deleted)
Stable and no chest pain 

## 2022-03-14 NOTE — Progress Notes (Signed)
?PROGRESS NOTE ? ? ? Logan Vazquez  TAV:[id number] DOB: May 29, 1946 DOA: 03/13/2022 ?PCP: Oswaldo Conroy, MD  ? ? ?Brief Narrative:  ?Spoke to patient today: patient reported falling over the weekned when his feet got twisted . He got up and another day as he was going to the kitchen he fell on the ground but doesn't remember. He was confused on admission. He was found by his aid.  ? ?3/30This am he is appropriate, MS improved.  ? ? ? ?Consultants:  ? ? ?Procedures:  ? ?Antimicrobials:  ?  ? ? ?Subjective: ?Denies sob, cp. C/O some LLQ abd pain . No n/f ? ?Objective: ?Vitals:  ? 03/14/22 1415 03/14/22 1430 03/14/22 1445 03/14/22 1545  ?BP:  116/66  124/68  ?Pulse: 82   79  ?Resp: 17 15 17 16   ?Temp:    98.3 ?F (36.8 ?C)  ?TempSrc:      ?SpO2: 91%     ?Weight:      ?Height:      ? ? ?Intake/Output Summary (Last 24 hours) at 03/14/2022 1549 ?Last data filed at 03/14/2022 1114 ?Gross per 24 hour  ?Intake 1000 ml  ?Output 350 ml  ?Net 650 ml  ? ?Filed Weights  ? 03/13/22 1519  ?Weight: 57.2 kg  ? ? ?Examination: ?Calm, NAD ?Cta no w/r ?Reg s1/s2 no gallop ?Soft mild TTP at LLQ, no rebound  ?No edema ?Aaoxox3  ?Mood and affect appropriate in current setting  ? ? ? ?Data Reviewed: I have personally reviewed following labs and imaging studies ? ?CBC: ?Recent Labs  ?Lab 03/13/22 ?1530 03/13/22 ?2118  ?WBC 12.6* 11.9*  ?NEUTROABS 10.7*  --   ?HGB 10.8* 10.5*  ?HCT 34.4* 32.9*  ?MCV 94.0 92.9  ?PLT 276 259  ? ?Basic Metabolic Panel: ?Recent Labs  ?Lab 03/13/22 ?1530 03/13/22 ?2118 03/14/22 ?1057  ?NA 137  --  137  ?K 4.2  --  3.7  ?CL 104  --  107  ?CO2 22  --  24  ?GLUCOSE 88  --  128*  ?BUN 43*  --  23  ?CREATININE 2.05* 1.52* 0.88  ?CALCIUM 8.1*  --  7.8*  ? ?GFR: ?Estimated Creatinine Clearance: 57.8 mL/min (by C-G formula based on SCr of 0.88 mg/dL). ?Liver Function Tests: ?Recent Labs  ?Lab 03/13/22 ?1530  ?AST 41  ?ALT 19  ?ALKPHOS 86  ?BILITOT 1.1  ?PROT 6.7  ?ALBUMIN 3.7  ? ?No results for input(s): LIPASE, AMYLASE  in the last 168 hours. ?No results for input(s): AMMONIA in the last 168 hours. ?Coagulation Profile: ?No results for input(s): INR, PROTIME in the last 168 hours. ?Cardiac Enzymes: ?Recent Labs  ?Lab 03/13/22 ?1530 03/14/22 ?1057  ?CKTOTAL 1,690* 1,026*  ? ?BNP (last 3 results) ?No results for input(s): PROBNP in the last 8760 hours. ?HbA1C: ?No results for input(s): HGBA1C in the last 72 hours. ?CBG: ?No results for input(s): GLUCAP in the last 168 hours. ?Lipid Profile: ?No results for input(s): CHOL, HDL, LDLCALC, TRIG, CHOLHDL, LDLDIRECT in the last 72 hours. ?Thyroid Function Tests: ?No results for input(s): TSH, T4TOTAL, FREET4, T3FREE, THYROIDAB in the last 72 hours. ?Anemia Panel: ?No results for input(s): VITAMINB12, FOLATE, FERRITIN, TIBC, IRON, RETICCTPCT in the last 72 hours. ?Sepsis Labs: ?No results for input(s): PROCALCITON, LATICACIDVEN in the last 168 hours. ? ?No results found for this or any previous visit (from the past 240 hour(s)).  ? ? ? ? ? ?Radiology Studies: ?CT HEAD WO CONTRAST (03/16/22) ? ?Result Date:  03/13/2022 ?CLINICAL DATA:  Fall.  Found down. EXAM: CT HEAD WITHOUT CONTRAST CT CERVICAL SPINE WITHOUT CONTRAST TECHNIQUE: Multidetector CT imaging of the head and cervical spine was performed following the standard protocol without intravenous contrast. Multiplanar CT image reconstructions of the cervical spine were also generated. RADIATION DOSE REDUCTION: This exam was performed according to the departmental dose-optimization program which includes automated exposure control, adjustment of the mA and/or kV according to patient size and/or use of iterative reconstruction technique. COMPARISON:  None. FINDINGS: CT HEAD FINDINGS Brain: Mild atrophy. Negative for hydrocephalus. Moderate patchy white matter hypodensity bilaterally. Negative for acute infarct, hemorrhage, mass Vascular: Atherosclerotic calcification distal left vertebral artery and carotid artery bilaterally. Negative for  hyperdense vessel Skull: Negative Sinuses/Orbits: Paranasal sinuses clear.  Normal orbit Other: None CT CERVICAL SPINE FINDINGS Alignment: Normal Skull base and vertebrae: Negative for acute fracture. Chronic compression fracture C7 unchanged from prior studies. Soft tissues and spinal canal: Negative for soft tissue mass or adenopathy. Atherosclerotic calcification in the carotid artery bilaterally. Disc levels: Multilevel disc and facet degeneration. Mild spinal in foraminal stenosis C5-6 and C6-7. Upper chest: Lung apices clear bilaterally Other: None IMPRESSION: 1. No acute intracranial abnormality. Atrophy and moderate chronic microvascular ischemic change 2. Chronic compression fracture of C7.  No acute cervical fracture. Electronically Signed   By: Marlan Palau M.D.   On: 03/13/2022 15:50  ? ?CT Cervical Spine Wo Contrast ? ?Result Date: 03/13/2022 ?CLINICAL DATA:  Fall.  Found down. EXAM: CT HEAD WITHOUT CONTRAST CT CERVICAL SPINE WITHOUT CONTRAST TECHNIQUE: Multidetector CT imaging of the head and cervical spine was performed following the standard protocol without intravenous contrast. Multiplanar CT image reconstructions of the cervical spine were also generated. RADIATION DOSE REDUCTION: This exam was performed according to the departmental dose-optimization program which includes automated exposure control, adjustment of the mA and/or kV according to patient size and/or use of iterative reconstruction technique. COMPARISON:  None. FINDINGS: CT HEAD FINDINGS Brain: Mild atrophy. Negative for hydrocephalus. Moderate patchy white matter hypodensity bilaterally. Negative for acute infarct, hemorrhage, mass Vascular: Atherosclerotic calcification distal left vertebral artery and carotid artery bilaterally. Negative for hyperdense vessel Skull: Negative Sinuses/Orbits: Paranasal sinuses clear.  Normal orbit Other: None CT CERVICAL SPINE FINDINGS Alignment: Normal Skull base and vertebrae: Negative for acute  fracture. Chronic compression fracture C7 unchanged from prior studies. Soft tissues and spinal canal: Negative for soft tissue mass or adenopathy. Atherosclerotic calcification in the carotid artery bilaterally. Disc levels: Multilevel disc and facet degeneration. Mild spinal in foraminal stenosis C5-6 and C6-7. Upper chest: Lung apices clear bilaterally Other: None IMPRESSION: 1. No acute intracranial abnormality. Atrophy and moderate chronic microvascular ischemic change 2. Chronic compression fracture of C7.  No acute cervical fracture. Electronically Signed   By: Marlan Palau M.D.   On: 03/13/2022 15:50  ? ?MR BRAIN WO CONTRAST ? ?Result Date: 03/14/2022 ?CLINICAL DATA:  76 year old male fell out of bed. Found down. Altered mental status. EXAM: MRI HEAD WITHOUT CONTRAST TECHNIQUE: Multiplanar, multiecho pulse sequences of the brain and surrounding structures were obtained without intravenous contrast. COMPARISON:  CT head and cervical spine yesterday. FINDINGS: Brain: No restricted diffusion or evidence of acute infarction. Chronic subcortical infarct with white matter encephalomalacia in the left superior frontal gyrus. Chronic lacunar infarct tracking from the left corona radiata to the lateral thalamus. Confluent additional bilateral cerebral white matter T2 and FLAIR hyperintensity, and moderate additional signal heterogeneity in the deep gray matter nuclei and brainstem. No cortical encephalomalacia or chronic cerebral  blood products identified. No restricted diffusion to suggest acute infarction. No midline shift, mass effect, evidence of mass lesion, ventriculomegaly, extra-axial collection or acute intracranial hemorrhage. Vascular: Major intracranial vascular flow voids are preserved. Generalized intracranial artery tortuosity. Skull and upper cervical spine: Negative visible cervical spine. Normal bone marrow signal. Sinuses/Orbits: Negative. Paranasal sinuses and mastoids are stable and well  aerated. Other: Grossly normal visible internal auditory structures. Negative visible scalp and face. IMPRESSION: 1. No acute intracranial abnormality. 2. Moderately advanced chronic small vessel disease. Electron

## 2022-03-14 NOTE — Progress Notes (Signed)
Admission profile updated. ?

## 2022-03-14 NOTE — ED Notes (Signed)
This RN to bedside for medication. Pt. Sitting in stretcher, boosted and repositioned for comfort and to take meds. Pt. Conversational and pleasant. Denies any food, or hunger currently. Pt. Requests pain pill. Pt. Given fresh cold water to drink, denies any further need. ?

## 2022-03-14 NOTE — ED Notes (Signed)
Pt was yelling in room, RN found to to have pulled off IV tubing, iv catheter leaking blood all over pt and bed and cords connected to monitor, pt had also ripped off urine bag that was connected to suction. Pt states he thought there was someone under his bed so he started ripping at things. Pt cleaned of blood and urine, new linen placed on bed, pt provided with gown, pt given warm blankets and reoriented to situation. Pt aware of surroundings. ?

## 2022-03-15 ENCOUNTER — Inpatient Hospital Stay: Payer: Medicare Other

## 2022-03-15 DIAGNOSIS — W19XXXA Unspecified fall, initial encounter: Secondary | ICD-10-CM | POA: Diagnosis not present

## 2022-03-15 DIAGNOSIS — Y92009 Unspecified place in unspecified non-institutional (private) residence as the place of occurrence of the external cause: Secondary | ICD-10-CM | POA: Diagnosis not present

## 2022-03-15 DIAGNOSIS — I10 Essential (primary) hypertension: Secondary | ICD-10-CM | POA: Diagnosis not present

## 2022-03-15 DIAGNOSIS — I7143 Infrarenal abdominal aortic aneurysm, without rupture: Secondary | ICD-10-CM

## 2022-03-15 LAB — GASTROINTESTINAL PANEL BY PCR, STOOL (REPLACES STOOL CULTURE)

## 2022-03-15 LAB — ECHOCARDIOGRAM COMPLETE
AR max vel: 3.77 cm2
AV Area VTI: 3.62 cm2
AV Area mean vel: 3.24 cm2
AV Mean grad: 6 mmHg
AV Peak grad: 10.5 mmHg
Ao pk vel: 1.62 m/s
Area-P 1/2: 4.8 cm2
Height: 69 in
MV VTI: 4.25 cm2
P 1/2 time: 329 msec
S' Lateral: 2.99 cm
Weight: 2016 oz

## 2022-03-15 LAB — C DIFFICILE QUICK SCREEN W PCR REFLEX
C Diff antigen: NEGATIVE
C Diff interpretation: NOT DETECTED
C Diff toxin: NEGATIVE

## 2022-03-15 MED ORDER — ENSURE ENLIVE PO LIQD
237.0000 mL | Freq: Three times a day (TID) | ORAL | Status: DC
  Administered 2022-03-15 – 2022-03-25 (×27): 237 mL via ORAL

## 2022-03-15 MED ORDER — LABETALOL HCL 5 MG/ML IV SOLN
10.0000 mg | INTRAVENOUS | Status: DC | PRN
  Administered 2022-03-16 – 2022-03-17 (×4): 10 mg via INTRAVENOUS
  Filled 2022-03-15 (×4): qty 4

## 2022-03-15 MED ORDER — QUETIAPINE FUMARATE 25 MG PO TABS: 25.0000 mg | ORAL_TABLET | Freq: Every day | ORAL | Status: DC

## 2022-03-15 MED ORDER — FLUOXETINE HCL 20 MG PO CAPS
40.0000 mg | ORAL_CAPSULE | Freq: Every day | ORAL | Status: DC
  Administered 2022-03-17 – 2022-03-26 (×10): 40 mg via ORAL
  Filled 2022-03-15 (×12): qty 2

## 2022-03-15 MED ORDER — SODIUM CHLORIDE 0.9 % IV SOLN: INTRAVENOUS | Status: AC

## 2022-03-15 MED ORDER — HALOPERIDOL LACTATE 5 MG/ML IJ SOLN
1.0000 mg | Freq: Four times a day (QID) | INTRAMUSCULAR | Status: DC | PRN
  Administered 2022-03-16 (×2): 1 mg via INTRAVENOUS
  Filled 2022-03-15 (×2): qty 1

## 2022-03-15 MED ORDER — NICOTINE 14 MG/24HR TD PT24
14.0000 mg | MEDICATED_PATCH | Freq: Every day | TRANSDERMAL | Status: DC
  Administered 2022-03-15 – 2022-03-26 (×9): 14 mg via TRANSDERMAL
  Filled 2022-03-15 (×9): qty 1

## 2022-03-15 NOTE — TOC Initial Note (Signed)
Transition of Care (TOC) - Initial/Assessment Note  ? ? ?Patient Details  ?Name: Logan Vazquez ?MRN: [id number] ?Date of Birth: Apr 14, 1946 ? ?Transition of Care (TOC) CM/SW Contact:    ?Beverly Sessions, RN ?Phone Number: ?03/15/2022, 2:55 PM ? ?Clinical Narrative:                 ?Admitted for: Rhabdo s/p fall ?Admitted from: home alone ?PCP: Rebeca Alert  ? ?Patient only alert to self.  Sister Festus Holts in the room.  PT recommending SNF.  Sister in agreement, she states that she is not sure that in his current condition he will ever be able to go back home. She states that his mobility and mental status has been declining over the past few years.  She is aware that with the patient's behaviors, and potential need for LTC after STR that he would likely have to go out of county.  ? ?Existing PASRR  ?Fl2 sent for signature ?Bed search initiated  ? ?Expected Discharge Plan: [place] ?Barriers to Discharge: Continued Medical Work up ? ? ?Patient Goals and CMS Choice ?  ?  ?  ? ?Expected Discharge Plan and Services ?Expected Discharge Plan: [place] ?  ?Discharge Planning Services: CM Consult ?  ?Living arrangements for the past 2 months: [place] ?                ?  ?  ?  ?  ?  ?  ?  ?  ?  ?  ? ?Prior Living Arrangements/Services ?Living arrangements for the past 2 months: [place] ?Lives with:: Self ?Patient language and need for interpreter reviewed:: Yes ?Do you feel safe going back to the place where you live?: Yes      ?Need for Family Participation in Patient Care: Yes (Comment) ?Care giver support system in place?: Yes (comment) ?  ?Criminal Activity/Legal Involvement Pertinent to Current Situation/Hospitalization: No - Comment as needed ? ?Activities of Daily Living ?  ?ADL Screening (condition at time of admission) ?Patient's cognitive ability adequate to safely complete daily activities?: No ?Is the patient deaf or have difficulty hearing?: No ?Does the patient have  difficulty seeing, even when wearing glasses/contacts?: No ?Does the patient have difficulty concentrating, remembering, or making decisions?: Yes ?Patient able to express need for assistance with ADLs?: No ?Does the patient have difficulty dressing or bathing?: Yes ?Independently performs ADLs?: No ?Communication: Independent ?Dressing (OT): Needs assistance ?Is this a change from baseline?: Change from baseline, expected to last <3days ?Grooming: Needs assistance ?Is this a change from baseline?: Change from baseline, expected to last <3 days ?Feeding: Independent ?Bathing: Needs assistance ?Is this a change from baseline?: Change from baseline, expected to last <3 days ?Toileting: Needs assistance ?Is this a change from baseline?: Change from baseline, expected to last <3 days ?In/Out Bed: Needs assistance ?Is this a change from baseline?: Change from baseline, expected to last <3 days ?Walks in Home: Independent with device (comment) ?Does the patient have difficulty walking or climbing stairs?: Yes ?Weakness of Legs: Both ?Weakness of Arms/Hands: None ? ?Permission Sought/Granted ?  ?  ?   ?   ?   ?   ? ?Emotional Assessment ?  ?  ?  ?Orientation: : Oriented to Self ?Alcohol / Substance Use: Not Applicable ?Psych Involvement: No (comment) ? ?Admission diagnosis:  Syncope and collapse [R55] ?Fall, initial encounter [W19.XXXA] ?Traumatic rhabdomyolysis, initial encounter ([place]) [T79.6XXA] ?Patient Active Problem List  ? Diagnosis Date  Noted  ? Fall at home, initial encounter 03/14/2022  ? AKI (acute kidney injury) ([place]) 03/14/2022  ? Anemia 03/14/2022  ? Rhabdomyolysis 03/13/2022  ? Syncope and collapse 03/13/2022  ? Thoracic facet joint syndrome 02/07/2021  ? Cervical facet joint syndrome 02/07/2021  ? Spinal stenosis of lumbar region 03/04/2019  ? Chronic hip pain, left 01/05/2019  ? Chronic pain syndrome 07/08/2018  ? Long term current use of opiate analgesic 07/08/2018  ? Chronic bilateral low back pain with  bilateral sciatica 07/08/2018  ? History of lumbar spinal fusion 02/03/2018  ? Lumbar degenerative disc disease 02/03/2018  ? History of CVA (cerebrovascular accident) 12/26/2017  ? Acquired right foot drop 12/26/2017  ? Ischemic stroke ([place]) 11/16/2017  ? Depression, recurrent ([place]) 10/23/2017  ? COPD (chronic obstructive pulmonary disease) ([place]) 10/23/2017  ? Tobacco dependence 09/05/2017  ? Pain medication agreement 12/17/2016  ? Abdominal aortic aneurysm (AAA) without rupture 08/28/2016  ? Descending thoracic aortic aneurysm 08/28/2016  ? Enthesopathy of knee 10/05/2015  ? Anxiety disorder 07/05/2015  ? Major depressive disorder, single episode 07/05/2015  ? Recurrent major depressive disorder, in partial remission ([place]) 07/05/2015  ? Chronic nausea 06/26/2015  ? Essential hypertension 06/16/2015  ? Chronic prescription opiate use 05/25/2015  ? Stable angina ([place]) 02/22/2015  ? At high risk for falls 01/20/2015  ? GERD (gastroesophageal reflux disease) 07/12/2013  ? Tobacco use disorder 05/14/2013  ? Back pain 04/15/2013  ? Atherosclerotic heart disease of native coronary artery without angina pectoris 04/15/2013  ? ?PCP:  Letta Median, MD ?Pharmacy:   ?[place], [place] ?[address] ?[place] [address] ?Phone: [phone] Fax: [phone] ? ?[place], [place] ?[place] ?[place] [address] ?Phone: [phone] Fax: [phone] ? ?[facility] [place], [facility] ?[address] ?[place] [address] ?Phone: [phone] Fax: [phone] ? ? ? ? ?Social Determinants of Health (SDOH) Interventions ?  ? ?Readmission Risk Interventions ?   ? View : No data to display.  ?  ?  ?  ? ? ? ?

## 2022-03-15 NOTE — Plan of Care (Signed)
?  Problem: Clinical Measurements: ?Goal: Will remain free from infection ?Outcome: Completed/Met ?Goal: Respiratory complications will improve ?Outcome: Completed/Met ?  ?Problem: Elimination: ?Goal: Will not experience complications related to bowel motility ?Outcome: Progressing ?  ?Problem: Pain Managment: ?Goal: General experience of comfort will improve ?Outcome: Completed/Met ?  ?Problem: Skin Integrity: ?Goal: Risk for impaired skin integrity will decrease ?Outcome: Completed/Met ?  ?Problem: Pain Managment: ?Goal: General experience of comfort will improve ?Outcome: Completed/Met ?  ?

## 2022-03-15 NOTE — Progress Notes (Signed)
Cross Cover ?Overnight patient became so confused, agitated and aggressive he had to be given IM haldol and lorazepam for patient and staff safety ? ?

## 2022-03-15 NOTE — Progress Notes (Addendum)
?PROGRESS NOTE ? ? ? Logan Vazquez  YTK:[id number] DOB: November 22, 1946 DOA: 03/13/2022 ?PCP: Oswaldo Conroy, MD  ? ? ?Brief Narrative:  ?Spoke to patient today: patient reported falling over the weekned when his feet got twisted . He got up and another day as he was going to the kitchen he fell on the ground but doesn't remember. He was confused on admission. He was found by his aid.  ? ?3/30This am he is appropriate, MS improved.  ? ?3/31 confused this am. Needed restraints and mittens as he was pulling off IV ? ?Consultants:  ? ? ?Procedures:  ? ?Antimicrobials:  ?  ? ? ?Subjective: ?Confused. Had diarrhea this am.  ? ?Objective: ?Vitals:  ? 03/14/22 1800 03/14/22 1809 03/14/22 2147 03/15/22 7517  ?BP:  111/64 (!) 143/119 132/86  ?Pulse: 82 80 90 87  ?Resp: 15 18 18 20   ?Temp:  98.6 ?F (37 ?C) 98.4 ?F (36.9 ?C) 97.7 ?F (36.5 ?C)  ?TempSrc:  Oral    ?SpO2: 98% 95% 98% 97%  ?Weight:      ?Height:      ? ? ?Intake/Output Summary (Last 24 hours) at 03/15/2022 1310 ?Last data filed at 03/15/2022 1217 ?Gross per 24 hour  ?Intake 310.07 ml  ?Output 700 ml  ?Net -389.93 ml  ? ?Filed Weights  ? 03/13/22 1519  ?Weight: 57.2 kg  ? ? ?Examination: ?Calm, NAD ?Mild coarse bs, no wheezing ?Reg s1/s2 no gallop ?Soft benign mild TTP LQ ?No edema ?Awake and alert, but confused. ?Mood and affect appropriate in current setting  ? ? ? ?Data Reviewed: I have personally reviewed following labs and imaging studies ? ?CBC: ?Recent Labs  ?Lab 03/13/22 ?1530 03/13/22 ?2118  ?WBC 12.6* 11.9*  ?NEUTROABS 10.7*  --   ?HGB 10.8* 10.5*  ?HCT 34.4* 32.9*  ?MCV 94.0 92.9  ?PLT 276 259  ? ?Basic Metabolic Panel: ?Recent Labs  ?Lab 03/13/22 ?1530 03/13/22 ?2118 03/14/22 ?1057  ?NA 137  --  137  ?K 4.2  --  3.7  ?CL 104  --  107  ?CO2 22  --  24  ?GLUCOSE 88  --  128*  ?BUN 43*  --  23  ?CREATININE 2.05* 1.52* 0.88  ?CALCIUM 8.1*  --  7.8*  ? ?GFR: ?Estimated Creatinine Clearance: 57.8 mL/min (by C-G formula based on SCr of 0.88 mg/dL). ?Liver  Function Tests: ?Recent Labs  ?Lab 03/13/22 ?1530  ?AST 41  ?ALT 19  ?ALKPHOS 86  ?BILITOT 1.1  ?PROT 6.7  ?ALBUMIN 3.7  ? ?No results for input(s): LIPASE, AMYLASE in the last 168 hours. ?No results for input(s): AMMONIA in the last 168 hours. ?Coagulation Profile: ?No results for input(s): INR, PROTIME in the last 168 hours. ?Cardiac Enzymes: ?Recent Labs  ?Lab 03/13/22 ?1530 03/14/22 ?1057  ?CKTOTAL 1,690* 1,026*  ? ?BNP (last 3 results) ?No results for input(s): PROBNP in the last 8760 hours. ?HbA1C: ?No results for input(s): HGBA1C in the last 72 hours. ?CBG: ?No results for input(s): GLUCAP in the last 168 hours. ?Lipid Profile: ?No results for input(s): CHOL, HDL, LDLCALC, TRIG, CHOLHDL, LDLDIRECT in the last 72 hours. ?Thyroid Function Tests: ?No results for input(s): TSH, T4TOTAL, FREET4, T3FREE, THYROIDAB in the last 72 hours. ?Anemia Panel: ?No results for input(s): VITAMINB12, FOLATE, FERRITIN, TIBC, IRON, RETICCTPCT in the last 72 hours. ?Sepsis Labs: ?No results for input(s): PROCALCITON, LATICACIDVEN in the last 168 hours. ? ?Recent Results (from the past 240 hour(s))  ?Gastrointestinal Panel by PCR ,  Stool     Status: None  ? Collection Time: 03/15/22 10:00 AM  ? Specimen: Stool  ?Result Value Ref Range Status  ? Campylobacter species NOT DETECTED NOT DETECTED Final  ? Plesimonas shigelloides NOT DETECTED NOT DETECTED Final  ? Salmonella species NOT DETECTED NOT DETECTED Final  ? Yersinia enterocolitica NOT DETECTED NOT DETECTED Final  ? Vibrio species NOT DETECTED NOT DETECTED Final  ? Vibrio cholerae NOT DETECTED NOT DETECTED Final  ? Enteroaggregative E coli (EAEC) NOT DETECTED NOT DETECTED Final  ? Enteropathogenic E coli (EPEC) NOT DETECTED NOT DETECTED Final  ? Enterotoxigenic E coli (ETEC) NOT DETECTED NOT DETECTED Final  ? Shiga like toxin producing E coli (STEC) NOT DETECTED NOT DETECTED Final  ? Shigella/Enteroinvasive E coli (EIEC) NOT DETECTED NOT DETECTED Final  ? Cryptosporidium NOT  DETECTED NOT DETECTED Final  ? Cyclospora cayetanensis NOT DETECTED NOT DETECTED Final  ? Entamoeba histolytica NOT DETECTED NOT DETECTED Final  ? Giardia lamblia NOT DETECTED NOT DETECTED Final  ? Adenovirus F40/41 NOT DETECTED NOT DETECTED Final  ? Astrovirus NOT DETECTED NOT DETECTED Final  ? Norovirus GI/GII NOT DETECTED NOT DETECTED Final  ? Rotavirus A NOT DETECTED NOT DETECTED Final  ? Sapovirus (I, II, IV, and V) NOT DETECTED NOT DETECTED Final  ?  Comment: Performed at [facility], [address] [place], [place] [address]  ?  ? ? ? ? ? ?Radiology Studies: ?CT ABDOMEN PELVIS WO CONTRAST ? ?Result Date: 03/14/2022 ?CLINICAL DATA:  Abdominal pain, acute, nonlocalized LLQ abd pain. EXAM: CT ABDOMEN AND PELVIS WITHOUT CONTRAST TECHNIQUE: Multidetector CT imaging of the abdomen and pelvis was performed following the standard protocol without IV contrast. RADIATION DOSE REDUCTION: This exam was performed according to the departmental dose-optimization program which includes automated exposure control, adjustment of the mA and/or kV according to patient size and/or use of iterative reconstruction technique. COMPARISON:  CT 02/18/2019, thoracic spine radiograph 02/07/2021. FINDINGS: Lower chest: There is peripheral predominant fibrotic change in the lung bases, similar to prior exam in March 2020. Coronary artery calcifications. No acute findings. Hepatobiliary: No focal liver abnormality is seen. Prior cholecystectomy. Pancreas: Mild pancreatic atrophy.  No ductal dilation. Spleen: Normal in size without focal abnormality. Adrenals/Urinary Tract: Adrenal glands are unremarkable. There is mild renal pelvic fullness, right greater than left likely related to marked bladder distension. There is a large left posterior renal cyst measuring simple density. The bladder is markedly distended. Stomach/Bowel: The stomach is within normal limits. There is no evidence of bowel obstruction.The appendix is normal.  There is a significant stool burden in the colon. Vascular/Lymphatic: Extensive aortoiliac atherosclerosis. An infrarenal abdominal aortic aneurysm measuring up to 6.0 by 5.8 cm (series 2, image 38). There is also aneurysmal dilation superior to this level measuring 3.7 x 3.4 cm (series 2, image 29). No lymphadenopathy. Reproductive: Enlarged prostate gland. Other: No bowel containing hernia.  No ascites.  No free air. Musculoskeletal: Prior lumbosacral inter spinous fusion. Multilevel degenerative changes of the spine. There is a compression fracture of T10 with severe anterior height loss, approximately 70%, as seen on prior thoracic spine radiograph from February 2022, though at that time there is measured 40% height loss. Unchanged mild anterior wedging of T12. Posttraumatic changes involving the left proximal femur, as seen on recent CT. There is mild to moderate bilateral hip osteoarthritis. IMPRESSION: Infrarenal abdominal aortic aneurysm measuring up to 6.0 cm. Recommend vascular surgery consultation. Marked bladder distension with associated mild fullness of the renal pelvises  bilaterally. Enlarged prostate gland. Significant stool burden in the colon suggesting constipation. Probable acute on chronic T10 compression fracture, with now approximately 70% height loss, previously 60%. There is potentially an acute component mild, 3 mm bony retropulsion. Basilar fibrotic lung disease, similar to prior CT in March 2020. Electronically Signed   By: Caprice Renshaw M.D.   On: 03/14/2022 16:30  ? ?CT HEAD WO CONTRAST ( ) ? ?Result Date: 03/13/2022 ?CLINICAL DATA:  Fall.  Found down. EXAM: CT HEAD WITHOUT CONTRAST CT CERVICAL SPINE WITHOUT CONTRAST TECHNIQUE: Multidetector CT imaging of the head and cervical spine was performed following the standard protocol without intravenous contrast. Multiplanar CT image reconstructions of the cervical spine were also generated. RADIATION DOSE REDUCTION: This exam was performed  according to the departmental dose-optimization program which includes automated exposure control, adjustment of the mA and/or kV according to patient size and/or use of iterative reconstruction technique. COMPARI
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
## 2022-03-15 NOTE — [place] FL2 (Signed)
?[place] MEDICAID FL2 LEVEL OF CARE SCREENING TOOL  ?  ? ?IDENTIFICATION  ?Patient Name: ?Logan Vazquez Birthdate: 01/17/1946 Sex: male Admission Date (Current Location): ?03/13/2022  ?[place] Number: ? [place] ?  Facility and Address:  ?[facility], [address], [place], [place] [address] ?     Provider Number: ?[id number]  ?Attending Physician Name and Address:  ?Lynn Ito, MD ? Relative Name and Phone Number:  ?Primitivo Gauze- [phone] ?   ?Current Level of Care: ?Hospital Recommended Level of Care: ?Skilled Nursing Facility Prior Approval Number: ?  ? ?Date Approved/Denied: ?  PASRR Number: ?[id number] A ? ?Discharge Plan: ?SNF ?  ? ?Current Diagnoses: ?Patient Active Problem List  ? Diagnosis Date Noted  ? Fall at home, initial encounter 03/14/2022  ? AKI (acute kidney injury) (HCC) 03/14/2022  ? Anemia 03/14/2022  ? Rhabdomyolysis 03/13/2022  ? Syncope and collapse 03/13/2022  ? Thoracic facet joint syndrome 02/07/2021  ? Cervical facet joint syndrome 02/07/2021  ? Spinal stenosis of lumbar region 03/04/2019  ? Chronic hip pain, left 01/05/2019  ? Chronic pain syndrome 07/08/2018  ? Long term current use of opiate analgesic 07/08/2018  ? Chronic bilateral low back pain with bilateral sciatica 07/08/2018  ? History of lumbar spinal fusion 02/03/2018  ? Lumbar degenerative disc disease 02/03/2018  ? History of CVA (cerebrovascular accident) 12/26/2017  ? Acquired right foot drop 12/26/2017  ? Ischemic stroke (HCC) 11/16/2017  ? Depression, recurrent (HCC) 10/23/2017  ? COPD (chronic obstructive pulmonary disease) (HCC) 10/23/2017  ? Tobacco dependence 09/05/2017  ? Pain medication agreement 12/17/2016  ? Abdominal aortic aneurysm (AAA) without rupture 08/28/2016  ? Descending thoracic aortic aneurysm 08/28/2016  ? Enthesopathy of knee 10/05/2015  ? Anxiety disorder 07/05/2015  ? Major depressive disorder, single episode 07/05/2015  ? Recurrent major depressive  disorder, in partial remission (HCC) 07/05/2015  ? Chronic nausea 06/26/2015  ? Essential hypertension 06/16/2015  ? Chronic prescription opiate use 05/25/2015  ? Stable angina (HCC) 02/22/2015  ? At high risk for falls 01/20/2015  ? GERD (gastroesophageal reflux disease) 07/12/2013  ? Tobacco use disorder 05/14/2013  ? Back pain 04/15/2013  ? Atherosclerotic heart disease of native coronary artery without angina pectoris 04/15/2013  ? ? ?Orientation RESPIRATION BLADDER Height & Weight   ?  ?Self ? Normal Incontinent Weight: 57.2 kg ?Height:  5\' 9"  (175.3 cm)  ?BEHAVIORAL SYMPTOMS/MOOD NEUROLOGICAL BOWEL NUTRITION STATUS  ?    Incontinent Diet (Regular, soft)  ?AMBULATORY STATUS COMMUNICATION OF NEEDS Skin   ?Supervision Verbally Normal ?  ?  ?  ?    ?     ?     ? ? ?Personal Care Assistance Level of Assistance  ?Total care   ?  ?  ?Total Care Assistance: Maximum assistance  ? ?Functional Limitations Info  ?    ?  ?   ? ? ?SPECIAL CARE FACTORS FREQUENCY  ?PT (By licensed PT)   ?  ?PT Frequency: Five days per week ?  ?  ?  ?  ?   ? ? ?Contractures Contractures Info: Not present  ? ? ?Additional Factors Info  ?Code Status, Allergies Code Status Info: FULL ?Allergies Info: Ibuprofen ?  ?  ?  ?   ? ?Current Medications (03/15/2022):  This is the current hospital active medication list ?Current Facility-Administered Medications  ?Medication Dose Route Frequency Provider Last Rate Last Admin  ? 0.9 %  sodium chloride infusion   Intravenous Continuous Amery, Sahar,  MD 100 mL/hr at 03/15/22 1350 Rate Change at 03/15/22 1350  ? albuterol (PROVENTIL) (2.5 MG/3ML) 0.083% nebulizer solution 3 mL  3 mL Nebulization Q6H PRN Gertha Calkin, MD      ? amLODipine (NORVASC) tablet 10 mg  10 mg Oral Daily Gertha Calkin, MD   10 mg at 03/14/22 0935  ? aspirin EC tablet 81 mg  81 mg Oral Daily Gertha Calkin, MD   81 mg at 03/14/22 6712  ? busPIRone (BUSPAR) tablet 15 mg  15 mg Oral BID Gertha Calkin, MD   15 mg at 03/14/22 2300  ? feeding  supplement (ENSURE ENLIVE / ENSURE PLUS) liquid 237 mL  237 mL Oral TID BM Lynn Ito, MD   237 mL at 03/15/22 1435  ? FLUoxetine (PROZAC) capsule 40 mg  40 mg Oral Daily Lowella Bandy, RPH      ? haloperidol lactate (HALDOL) injection 1 mg  1 mg Intravenous Q6H PRN Lynn Ito, MD      ? heparin injection 5,000 Units  5,000 Units Subcutaneous Q8H Irena Cords V, MD   5,000 Units at 03/15/22 1436  ? labetalol (NORMODYNE) injection 10 mg  10 mg Intravenous Q4H PRN Lynn Ito, MD      ? nicotine (NICODERM CQ - dosed in mg/24 hours) patch 14 mg  14 mg Transdermal Daily Lynn Ito, MD   14 mg at 03/15/22 1436  ? oxyCODONE (Oxy IR/ROXICODONE) immediate release tablet 5 mg  5 mg Oral Q6H PRN Tressie Ellis, RPH   5 mg at 03/13/22 2142  ? And  ? oxyCODONE-acetaminophen (PERCOCET/ROXICET) 5-325 MG per tablet 1 tablet  1 tablet Oral Q6H PRN Tressie Ellis, RPH      ? QUEtiapine (SEROQUEL) tablet 25 mg  25 mg Oral QHS Lynn Ito, MD      ? ? ? ?Discharge Medications: ?Please see discharge summary for a list of discharge medications. ? ?Relevant Imaging Results: ? ?Relevant Lab Results: ? ? ?Additional Information ?[id number] ? ?Truddie Hidden, RN ? ? ? ? ?

## 2022-03-16 DIAGNOSIS — W19XXXA Unspecified fall, initial encounter: Secondary | ICD-10-CM | POA: Diagnosis not present

## 2022-03-16 DIAGNOSIS — Y92009 Unspecified place in unspecified non-institutional (private) residence as the place of occurrence of the external cause: Secondary | ICD-10-CM | POA: Diagnosis not present

## 2022-03-16 DIAGNOSIS — M2550 Pain in unspecified joint: Secondary | ICD-10-CM | POA: Diagnosis not present

## 2022-03-16 DIAGNOSIS — Z01810 Encounter for preprocedural cardiovascular examination: Secondary | ICD-10-CM

## 2022-03-16 LAB — MRSA NEXT GEN BY PCR, NASAL: MRSA by PCR Next Gen: NOT DETECTED

## 2022-03-16 LAB — GLUCOSE, CAPILLARY: Glucose-Capillary: 126 mg/dL — ABNORMAL HIGH (ref 70–99)

## 2022-03-16 LAB — POTASSIUM: Potassium: 2.9 mmol/L — ABNORMAL LOW (ref 3.5–5.1)

## 2022-03-16 LAB — CREATININE, SERUM
Creatinine, Ser: 0.67 mg/dL (ref 0.61–1.24)
GFR, Estimated: >60 mL/min (ref >60)

## 2022-03-16 LAB — CK: Total CK: 288 U/L (ref 49–397)

## 2022-03-16 MED ORDER — POTASSIUM CHLORIDE 10 MEQ/100ML IV SOLN
10.0000 meq | INTRAVENOUS | Status: AC
  Administered 2022-03-16 (×4): 10 meq via INTRAVENOUS
  Filled 2022-03-16 (×4): qty 100

## 2022-03-16 MED ORDER — SODIUM CHLORIDE 0.9 % IV SOLN: INTRAVENOUS | Status: DC

## 2022-03-16 MED ORDER — METOPROLOL TARTRATE 25 MG PO TABS
12.5000 mg | ORAL_TABLET | Freq: Two times a day (BID) | ORAL | Status: DC
  Administered 2022-03-16 – 2022-03-26 (×13): 12.5 mg via ORAL
  Filled 2022-03-16 (×15): qty 1

## 2022-03-16 MED ORDER — QUETIAPINE FUMARATE 25 MG PO TABS
50.0000 mg | ORAL_TABLET | Freq: Every day | ORAL | Status: DC
Start: 2022-03-16 — End: 2022-03-23
  Administered 2022-03-16 – 2022-03-22 (×7): 50 mg via ORAL
  Filled 2022-03-16 (×7): qty 2

## 2022-03-16 MED ORDER — SODIUM CHLORIDE 0.9 % IV SOLN: INTRAVENOUS | Status: AC

## 2022-03-16 MED ORDER — POTASSIUM CHLORIDE CRYS ER 20 MEQ PO TBCR: 40.0000 meq | EXTENDED_RELEASE_TABLET | Freq: Once | ORAL | Status: DC

## 2022-03-16 MED ORDER — CHLORHEXIDINE GLUCONATE CLOTH 2 % EX PADS
6.0000 | MEDICATED_PAD | Freq: Every day | CUTANEOUS | Status: DC
  Administered 2022-03-16 – 2022-03-26 (×11): 6 via TOPICAL

## 2022-03-16 NOTE — Progress Notes (Addendum)
Patient continues to be confused but has verbalized that he would like the restraints taken off and agrees to not pull on anything. Education provided conerning removal of restraints, patient verbalized understanding concerning removing PIV;s or IV tubing and agreed that he would not pull on any lines or wires. @ 2200, BL soft wrist restraints removed as a trial period. Call light placed within reach and urinal placed at bedside. Bed alarm is on and will continue to monitor patient closely.  ?

## 2022-03-16 NOTE — Consult Note (Signed)
?  Cardiology requested to see for pre-operative evaluation prior to endovascular repair of abdominal aneurysm.  ? ?Upon interview, patient was very confused and it was difficult to obtain an accurate history. Unclear of his baseline mental status. Discussed with MD, and can revisit evaluation next week pending mental status.  ? ?Docia Klar Fransico Michael, PA-C ?

## 2022-03-16 NOTE — Progress Notes (Signed)
Patient's B/P is steadily increasing, Dr. Marylu Lund notified and ordered to decrease fluid rate to 53mL per hour. ?

## 2022-03-16 NOTE — Progress Notes (Signed)
?PROGRESS NOTE ? ? ? Logan Vazquez  IRC:[id number] DOB: 07-25-1946 DOA: 03/13/2022 ?PCP: Oswaldo Conroy, MD  ? ? ?Brief Narrative:  ?Spoke to patient today: patient reported falling over the weekned when his feet got twisted . He got up and another day as he was going to the kitchen he fell on the ground but doesn't remember. He was confused on admission. He was found by his aid.  ? ?3/30This am he is appropriate, MS improved.  ?3/31 confused this am. Needed restraints and mittens as he was pulling off IV ?4/1 confused this am. After speaking to him, he was interactive and agreeable to allowing ivf to run. Sitter at bedside. Transfer pt to SDU due to his ct findings of aneurysm for closer monitoring. ? ?Consultants:  ?Cardiology vascular surgery ? ?Procedures:  ? ?Antimicrobials:  ?  ? ? ?Subjective: ?Con.fused.  Denies shortness of breath or chest pain.  Only same left lower quadrant pain some diarrhea ? ?Objective: ?Vitals:  ? 03/15/22 2035 03/16/22 0617 03/16/22 1215 03/16/22 1300  ?BP: (!) 141/74 140/75 135/78 120/77  ?Pulse: 73 82 77   ?Resp:  16 18 (!) 22  ?Temp: 97.7 ?F (36.5 ?C) 98.3 ?F (36.8 ?C) 98.2 ?F (36.8 ?C) 98.4 ?F (36.9 ?C)  ?TempSrc: Oral   Oral  ?SpO2: 99% 95% 97% 96%  ?Weight:    51.9 kg  ?Height:      ? ? ?Intake/Output Summary (Last 24 hours) at 03/16/2022 1318 ?Last data filed at 03/16/2022 1211 ?Gross per 24 hour  ?Intake 1725.66 ml  ?Output 1150 ml  ?Net 575.66 ml  ? ?Filed Weights  ? 03/13/22 1519 03/16/22 1300  ?Weight: 57.2 kg 51.9 kg  ? ? ?Examination: ?Calm, NAD ?Cta no w/r ?Reg s1/s2 no gallop ?Soft non distended +bs ?No edema ?Confused. Grossly intact ?Mood and affect appropriate in current setting  ? ? ? ?Data Reviewed: I have personally reviewed following labs and imaging studies ? ?CBC: ?Recent Labs  ?Lab 03/13/22 ?1530 03/13/22 ?2118  ?WBC 12.6* 11.9*  ?NEUTROABS 10.7*  --   ?HGB 10.8* 10.5*  ?HCT 34.4* 32.9*  ?MCV 94.0 92.9  ?PLT 276 259  ? ?Basic Metabolic Panel: ?Recent Labs   ?Lab 03/13/22 ?1530 03/13/22 ?2118 03/14/22 ?1057 03/16/22 ?5102  ?NA 137  --  137  --   ?K 4.2  --  3.7 2.9*  ?CL 104  --  107  --   ?CO2 22  --  24  --   ?GLUCOSE 88  --  128*  --   ?BUN 43*  --  23  --   ?CREATININE 2.05* 1.52* 0.88 0.67  ?CALCIUM 8.1*  --  7.8*  --   ? ?GFR: ?Estimated Creatinine Clearance: 57.7 mL/min (by C-G formula based on SCr of 0.67 mg/dL). ?Liver Function Tests: ?Recent Labs  ?Lab 03/13/22 ?1530  ?AST 41  ?ALT 19  ?ALKPHOS 86  ?BILITOT 1.1  ?PROT 6.7  ?ALBUMIN 3.7  ? ?No results for input(s): LIPASE, AMYLASE in the last 168 hours. ?No results for input(s): AMMONIA in the last 168 hours. ?Coagulation Profile: ?No results for input(s): INR, PROTIME in the last 168 hours. ?Cardiac Enzymes: ?Recent Labs  ?Lab 03/13/22 ?1530 03/14/22 ?1057 03/16/22 ?5852  ?CKTOTAL 1,690* 1,026* 288  ? ?BNP (last 3 results) ?No results for input(s): PROBNP in the last 8760 hours. ?HbA1C: ?No results for input(s): HGBA1C in the last 72 hours. ?CBG: ?Recent Labs  ?Lab 03/16/22 ?1300  ?GLUCAP 126*  ? ?  Lipid Profile: ?No results for input(s): CHOL, HDL, LDLCALC, TRIG, CHOLHDL, LDLDIRECT in the last 72 hours. ?Thyroid Function Tests: ?No results for input(s): TSH, T4TOTAL, FREET4, T3FREE, THYROIDAB in the last 72 hours. ?Anemia Panel: ?No results for input(s): VITAMINB12, FOLATE, FERRITIN, TIBC, IRON, RETICCTPCT in the last 72 hours. ?Sepsis Labs: ?No results for input(s): PROCALCITON, LATICACIDVEN in the last 168 hours. ? ?Recent Results (from the past 240 hour(s))  ?Gastrointestinal Panel by PCR , Stool     Status: None  ? Collection Time: 03/15/22 10:00 AM  ? Specimen: Stool  ?Result Value Ref Range Status  ? Campylobacter species NOT DETECTED NOT DETECTED Final  ? Plesimonas shigelloides NOT DETECTED NOT DETECTED Final  ? Salmonella species NOT DETECTED NOT DETECTED Final  ? Yersinia enterocolitica NOT DETECTED NOT DETECTED Final  ? Vibrio species NOT DETECTED NOT DETECTED Final  ? Vibrio cholerae NOT DETECTED  NOT DETECTED Final  ? Enteroaggregative E coli (EAEC) NOT DETECTED NOT DETECTED Final  ? Enteropathogenic E coli (EPEC) NOT DETECTED NOT DETECTED Final  ? Enterotoxigenic E coli (ETEC) NOT DETECTED NOT DETECTED Final  ? Shiga like toxin producing E coli (STEC) NOT DETECTED NOT DETECTED Final  ? Shigella/Enteroinvasive E coli (EIEC) NOT DETECTED NOT DETECTED Final  ? Cryptosporidium NOT DETECTED NOT DETECTED Final  ? Cyclospora cayetanensis NOT DETECTED NOT DETECTED Final  ? Entamoeba histolytica NOT DETECTED NOT DETECTED Final  ? Giardia lamblia NOT DETECTED NOT DETECTED Final  ? Adenovirus F40/41 NOT DETECTED NOT DETECTED Final  ? Astrovirus NOT DETECTED NOT DETECTED Final  ? Norovirus GI/GII NOT DETECTED NOT DETECTED Final  ? Rotavirus A NOT DETECTED NOT DETECTED Final  ? Sapovirus (I, II, IV, and V) NOT DETECTED NOT DETECTED Final  ?  Comment: Performed at [facility], [address] [place], [place] [address]  ?C Difficile Quick Screen w PCR reflex     Status: None  ? Collection Time: 03/15/22  3:20 PM  ? Specimen: STOOL  ?Result Value Ref Range Status  ? C Diff antigen NEGATIVE NEGATIVE Final  ? C Diff toxin NEGATIVE NEGATIVE Final  ? C Diff interpretation No C. difficile detected.  Final  ?  Comment: Performed at [facility], [address], [place], [place] [address]  ?  ? ? ? ? ? ?Radiology Studies: ?CT ABDOMEN PELVIS WO CONTRAST ? ?Result Date: 03/14/2022 ?CLINICAL DATA:  Abdominal pain, acute, nonlocalized LLQ abd pain. EXAM: CT ABDOMEN AND PELVIS WITHOUT CONTRAST TECHNIQUE: Multidetector CT imaging of the abdomen and pelvis was performed following the standard protocol without IV contrast. RADIATION DOSE REDUCTION: This exam was performed according to the departmental dose-optimization program which includes automated exposure control, adjustment of the mA and/or kV according to patient size and/or use of iterative reconstruction technique. COMPARISON:  CT 02/18/2019, thoracic spine  radiograph 02/07/2021. FINDINGS: Lower chest: There is peripheral predominant fibrotic change in the lung bases, similar to prior exam in March 2020. Coronary artery calcifications. No acute findings. Hepatobiliary: No focal liver abnormality is seen. Prior cholecystectomy. Pancreas: Mild pancreatic atrophy.  No ductal dilation. Spleen: Normal in size without focal abnormality. Adrenals/Urinary Tract: Adrenal glands are unremarkable. There is mild renal pelvic fullness, right greater than left likely related to marked bladder distension. There is a large left posterior renal cyst measuring simple density. The bladder is markedly distended. Stomach/Bowel: The stomach is within normal limits. There is no evidence of bowel obstruction.The appendix is normal. There is a significant stool burden in the colon.  Vascular/Lymphatic: Extensive aortoiliac atherosclerosis. An infrarenal abdominal aortic aneurysm measuring up to 6.0 by 5.8 cm (series 2, image 38). There is also aneurysmal dilation superior to this level measuring 3.7 x 3.4 cm (series 2, image 29). No lymphadenopathy. Reproductive: Enlarged prostate gland. Other: No bowel containing hernia.  No ascites.  No free air. Musculoskeletal: Prior lumbosacral inter spinous fusion. Multilevel degenerative changes of the spine. There is a compression fracture of T10 with severe anterior height loss, approximately 70%, as seen on prior thoracic spine radiograph from February 2022, though at that time there is measured 40% height loss. Unchanged mild anterior wedging of T12. Posttraumatic changes involving the left proximal femur, as seen on recent CT. There is mild to moderate bilateral hip osteoarthritis. IMPRESSION: Infrarenal abdominal aortic aneurysm measuring up to 6.0 cm. Recommend vascular surgery consultation. Marked bladder distension with associated mild fullness of the renal pelvises bilaterally. Enlarged prostate gland. Significant stool burden in the colon  suggesting constipation. Probable acute on chronic T10 compression fracture, with now approximately 70% height loss, previously 60%. There is potentially an acute component mild, 3 mm bony retropulsion. Berdine Dance

## 2022-03-16 NOTE — Progress Notes (Signed)
PT Cancellation Note ? ?Patient Details ?Name: Logan Vazquez ?MRN: [id number] ?DOB: 1946/12/14 ? ? ?Cancelled Treatment:    Reason Eval/Treat Not Completed: Other (comment) Pt transferred to ICU for close monitoring as pt found to have abdominal aortic aneurysm & is awaiting surgery. Per protocol, will d/c current PT orders & await for new ones after surgery. MD made aware via secure chat. ? ?Aleda Grana, PT, DPT ?03/16/22, 3:01 PM ? ? ?Sandi Mariscal ?03/16/2022, 2:59 PM ?

## 2022-03-16 NOTE — Progress Notes (Addendum)
PT Cancellation Note ? ?Patient Details ?Name: Logan Vazquez ?MRN: [id number] ?DOB: Apr 14, 1946 ? ? ?Cancelled Treatment:     Pt was moved to higher level of care. Per PT protocol, we will re-evaluate when appropriate.  ? ? ?Rushie Chestnut ?03/16/2022, 1:59 PM ?
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
## 2022-03-16 NOTE — Plan of Care (Signed)
Continuing with plan of care. 

## 2022-03-17 DIAGNOSIS — W19XXXA Unspecified fall, initial encounter: Secondary | ICD-10-CM | POA: Diagnosis not present

## 2022-03-17 DIAGNOSIS — M2550 Pain in unspecified joint: Secondary | ICD-10-CM | POA: Diagnosis not present

## 2022-03-17 DIAGNOSIS — Y92009 Unspecified place in unspecified non-institutional (private) residence as the place of occurrence of the external cause: Secondary | ICD-10-CM | POA: Diagnosis not present

## 2022-03-17 LAB — POTASSIUM: Potassium: 2.9 mmol/L — ABNORMAL LOW (ref 3.5–5.1)

## 2022-03-17 LAB — CBC
HCT: 32.1 % — ABNORMAL LOW (ref 39.0–52.0)
Hemoglobin: 10.6 g/dL — ABNORMAL LOW (ref 13.0–17.0)
MCH: 29.6 pg (ref 26.0–34.0)
MCHC: 33 g/dL (ref 30.0–36.0)
MCV: 89.7 fL (ref 80.0–100.0)
Platelets: 251 10*3/uL (ref 150–400)
RBC: 3.58 MIL/uL — ABNORMAL LOW (ref 4.22–5.81)
RDW: 14.7 % (ref 11.5–15.5)
WBC: 6.9 10*3/uL (ref 4.0–10.5)
nRBC: 0 % (ref 0.0–0.2)

## 2022-03-17 LAB — MAGNESIUM: Magnesium: 1.6 mg/dL — ABNORMAL LOW (ref 1.7–2.4)

## 2022-03-17 MED ORDER — POTASSIUM CHLORIDE 10 MEQ/100ML IV SOLN
10.0000 meq | INTRAVENOUS | Status: AC
  Administered 2022-03-17 (×4): 10 meq via INTRAVENOUS
  Filled 2022-03-17 (×4): qty 100

## 2022-03-17 MED ORDER — MAGNESIUM SULFATE 4 GM/100ML IV SOLN
4.0000 g | Freq: Once | INTRAVENOUS | Status: AC
Start: 2022-03-17 — End: 2022-03-17
  Administered 2022-03-17: 4 g via INTRAVENOUS
  Filled 2022-03-17: qty 100

## 2022-03-17 MED ORDER — POTASSIUM CHLORIDE CRYS ER 20 MEQ PO TBCR
40.0000 meq | EXTENDED_RELEASE_TABLET | Freq: Once | ORAL | Status: AC
  Administered 2022-03-17: 40 meq via ORAL
  Filled 2022-03-17: qty 2

## 2022-03-17 NOTE — Plan of Care (Signed)
Continuing with plan of care. 

## 2022-03-17 NOTE — Progress Notes (Signed)
?PROGRESS NOTE ? ? ? Logan Vazquez  [id number] DOB: Dec 22, 1945 DOA: 03/13/2022 ?PCP: Letta Median, MD  ? ? ?Brief Narrative:  ?Spoke to patient today: patient reported falling over the weekned when his feet got twisted . He got up and another day as he was going to the kitchen he fell on the ground but doesn't remember. He was confused on admission. He was found by his aid.  ? ?3/30This am he is appropriate, MS improved.  ?3/31 confused this am. Needed restraints and mittens as he was pulling off IV ?4/1 confused this am. After speaking to him, he was interactive and agreeable to allowing ivf to run. Sitter at bedside. Transfer pt to SDU due to his ct findings of aneurysm for closer monitoring. ? ?4/2 no overnight issues.  Patient is awake and alert this AM.  Calm.  Answering questions appropriately.  Has been receiving IV fluids overnight.  He is doing much better now that he is hydrated. ? ?Consultants:  ?Cardiology vascular surgery ? ?Procedures:  ? ?Antimicrobials:  ?  ? ? ?Subjective: ?Denies shortness of breath, chest pain or any other symptoms.  Still with left lower quadrant pain however states overall he feels better. ? ?Objective: ?Vitals:  ? 03/17/22 0500 03/17/22 0540 03/17/22 0600 03/17/22 0624  ?BP: 140/73 137/74 (!) 149/97 (!) 147/79  ?Pulse: 67 69 71 (!) 53  ?Resp: 20 (!) 23 (!) 24 (!) 22  ?Temp:      ?TempSrc:      ?SpO2: 95% 95% 97% 96%  ?Weight:      ?Height:      ? ? ?Intake/Output Summary (Last 24 hours) at 03/17/2022 0909 ?Last data filed at 03/17/2022 0700 ?Gross per 24 hour  ?Intake 1826.92 ml  ?Output 1175 ml  ?Net 651.92 ml  ? ?Filed Weights  ? 03/13/22 1519 03/16/22 1300  ?Weight: 57.2 kg 51.9 kg  ? ? ?Examination: ?Calm, NAD, appropriate ?Cta no w/r ?Reg s1/s2 no gallop ?Soft benign +bs ?No edema ?Aaoxox3  ?Mood and affect appropriate in current setting  ? ? ? ?Data Reviewed: I have personally reviewed following labs and imaging studies ? ?CBC: ?Recent Labs  ?Lab 03/13/22 ?1530  03/13/22 ?2118 03/17/22 ?0450  ?WBC 12.6* 11.9* 6.9  ?NEUTROABS 10.7*  --   --   ?HGB 10.8* 10.5* 10.6*  ?HCT 34.4* 32.9* 32.1*  ?MCV 94.0 92.9 89.7  ?PLT 276 259 251  ? ?Basic Metabolic Panel: ?Recent Labs  ?Lab 03/13/22 ?1530 03/13/22 ?2118 03/14/22 ?1057 03/16/22 ?[id number] 03/17/22 ?0450  ?NA 137  --  137  --   --   ?K 4.2  --  3.7 2.9* 2.9*  ?CL 104  --  107  --   --   ?CO2 22  --  24  --   --   ?GLUCOSE 88  --  128*  --   --   ?BUN 43*  --  23  --   --   ?CREATININE 2.05* 1.52* 0.88 0.67  --   ?CALCIUM 8.1*  --  7.8*  --   --   ?MG  --   --   --   --  1.6*  ? ?GFR: ?Estimated Creatinine Clearance: 57.7 mL/min (by C-G formula based on SCr of 0.67 mg/dL). ?Liver Function Tests: ?Recent Labs  ?Lab 03/13/22 ?1530  ?AST 41  ?ALT 19  ?ALKPHOS 86  ?BILITOT 1.1  ?PROT 6.7  ?ALBUMIN 3.7  ? ?No results for input(s): LIPASE, AMYLASE in the last  168 hours. ?No results for input(s): AMMONIA in the last 168 hours. ?Coagulation Profile: ?No results for input(s): INR, PROTIME in the last 168 hours. ?Cardiac Enzymes: ?Recent Labs  ?Lab 03/13/22 ?1530 03/14/22 ?1057 03/16/22 ?[id number]  ?CKTOTAL 1,690* 1,026* 288  ? ?BNP (last 3 results) ?No results for input(s): PROBNP in the last 8760 hours. ?HbA1C: ?No results for input(s): HGBA1C in the last 72 hours. ?CBG: ?Recent Labs  ?Lab 03/16/22 ?1300  ?GLUCAP 126*  ? ?Lipid Profile: ?No results for input(s): CHOL, HDL, LDLCALC, TRIG, CHOLHDL, LDLDIRECT in the last 72 hours. ?Thyroid Function Tests: ?No results for input(s): TSH, T4TOTAL, FREET4, T3FREE, THYROIDAB in the last 72 hours. ?Anemia Panel: ?No results for input(s): VITAMINB12, FOLATE, FERRITIN, TIBC, IRON, RETICCTPCT in the last 72 hours. ?Sepsis Labs: ?No results for input(s): PROCALCITON, LATICACIDVEN in the last 168 hours. ? ?Recent Results (from the past 240 hour(s))  ?Gastrointestinal Panel by PCR , Stool     Status: None  ? Collection Time: 03/15/22 10:00 AM  ? Specimen: Stool  ?Result Value Ref Range Status  ? Campylobacter  species NOT DETECTED NOT DETECTED Final  ? Plesimonas shigelloides NOT DETECTED NOT DETECTED Final  ? Salmonella species NOT DETECTED NOT DETECTED Final  ? Yersinia enterocolitica NOT DETECTED NOT DETECTED Final  ? Vibrio species NOT DETECTED NOT DETECTED Final  ? Vibrio cholerae NOT DETECTED NOT DETECTED Final  ? Enteroaggregative E coli (EAEC) NOT DETECTED NOT DETECTED Final  ? Enteropathogenic E coli (EPEC) NOT DETECTED NOT DETECTED Final  ? Enterotoxigenic E coli (ETEC) NOT DETECTED NOT DETECTED Final  ? Shiga like toxin producing E coli (STEC) NOT DETECTED NOT DETECTED Final  ? Shigella/Enteroinvasive E coli (EIEC) NOT DETECTED NOT DETECTED Final  ? Cryptosporidium NOT DETECTED NOT DETECTED Final  ? Cyclospora cayetanensis NOT DETECTED NOT DETECTED Final  ? Entamoeba histolytica NOT DETECTED NOT DETECTED Final  ? Giardia lamblia NOT DETECTED NOT DETECTED Final  ? Adenovirus F40/41 NOT DETECTED NOT DETECTED Final  ? Astrovirus NOT DETECTED NOT DETECTED Final  ? Norovirus GI/GII NOT DETECTED NOT DETECTED Final  ? Rotavirus A NOT DETECTED NOT DETECTED Final  ? Sapovirus (I, II, IV, and V) NOT DETECTED NOT DETECTED Final  ?  Comment: Performed at [facility], [address] [place], [place] [address]  ?C Difficile Quick Screen w PCR reflex     Status: None  ? Collection Time: 03/15/22  3:20 PM  ? Specimen: STOOL  ?Result Value Ref Range Status  ? C Diff antigen NEGATIVE NEGATIVE Final  ? C Diff toxin NEGATIVE NEGATIVE Final  ? C Diff interpretation No C. difficile detected.  Final  ?  Comment: Performed at [facility], [address] [place],  [address]  ?MRSA Next Gen by PCR, Nasal     Status: None  ? Collection Time: 03/16/22 12:59 PM  ? Specimen: Nasal Mucosa; Nasal Swab  ?Result Value Ref Range Status  ? MRSA by PCR Next Gen NOT DETECTED NOT DETECTED Final  ?  Comment: (NOTE) ?The GeneXpert MRSA Assay (FDA approved for NASAL specimens only), ?is one component of a comprehensive  MRSA colonization surveillance ?program. It is not intended to diagnose MRSA infection nor to guide ?or monitor treatment for MRSA infections. ?Test performance is not FDA approved in patients less than 2 years ?old. ?Performed at [facility], [place], ?[place] [address] ?  ?  ? ? ? ? ? ?Radiology Studies: ?DG Chest Port 1 View ? ?  Result Date: 03/15/2022 ?CLINICAL DATA:  Provider notes new rhonchi.  History of COPD. EXAM: PORTABLE CHEST 1 VIEW COMPARISON:  CT abdomen pelvis dated March 14, 2022. FINDINGS: The heart is normal in size. Aorta is tortuous. Biapical pleural/parenchymal scarring. There are bilateral interstitial markings consistent with fibrotic changes, as seen on prior CT examination. Mild elevation of the left hemidiaphragm with mild atelectasis, unchanged. No appreciable focal consolidation or pleural effusion. Diffuse osteopenia. Thoracic spondylosis. IMPRESSION: 1. Prominent bilateral interstitial markings consistent with chronic fibrotic/interstitial lung disease. No evidence of acute pneumonia or large pleural effusion. 2. Diffuse osteopenia and thoracic spondylosis. Electronically Signed   By: Keane Police D.O.   On: 03/15/2022 10:45   ? ? ? ? ? ?Scheduled Meds: ? amLODipine  10 mg Oral Daily  ? aspirin EC  81 mg Oral Daily  ? busPIRone  15 mg Oral BID  ? Chlorhexidine Gluconate Cloth  6 each Topical Daily  ? feeding supplement  237 mL Oral TID BM  ? FLUoxetine  40 mg Oral Daily  ? heparin  5,000 Units Subcutaneous Q8H  ? metoprolol tartrate  12.5 mg Oral BID  ? nicotine  14 mg Transdermal Daily  ? potassium chloride  40 mEq Oral Once  ? QUEtiapine  50 mg Oral QHS  ? ?Continuous Infusions: ? sodium chloride Stopped (03/17/22 0556)  ? potassium chloride 10 mEq (03/17/22 0710)  ? ? ?Assessment & Plan: ?  ?Principal Problem: ?  Fall at home, initial encounter ?Active Problems: ?  Essential hypertension ?  Rhabdomyolysis ?  Syncope and collapse ?  COPD (chronic obstructive  pulmonary disease) ([place]) ?  Atherosclerotic heart disease of native coronary artery without angina pectoris ?  GERD (gastroesophageal reflux disease) ?  Chronic hip pain, left ?  AKI (acute kidney injury) (HC

## 2022-03-17 NOTE — Progress Notes (Signed)
Hassan Rowan, NP notified that potassium is 2.9 and magnesium is 1.6, see new orders. ?

## 2022-03-17 NOTE — Progress Notes (Signed)
Patient has not been requiring restraints, easily redirected and following commands. Ordered discontinued per policy.  ?

## 2022-03-18 ENCOUNTER — Inpatient Hospital Stay: Payer: Medicare Other

## 2022-03-18 ENCOUNTER — Encounter: Payer: Self-pay | Admitting: Internal Medicine

## 2022-03-18 DIAGNOSIS — Y92009 Unspecified place in unspecified non-institutional (private) residence as the place of occurrence of the external cause: Secondary | ICD-10-CM | POA: Diagnosis not present

## 2022-03-18 DIAGNOSIS — R4182 Altered mental status, unspecified: Secondary | ICD-10-CM | POA: Diagnosis not present

## 2022-03-18 DIAGNOSIS — W19XXXA Unspecified fall, initial encounter: Secondary | ICD-10-CM | POA: Diagnosis not present

## 2022-03-18 LAB — URINALYSIS, COMPLETE (UACMP) WITH MICROSCOPIC
Bacteria, UA: NONE SEEN
Bilirubin Urine: NEGATIVE
Glucose, UA: NEGATIVE mg/dL
Hgb urine dipstick: NEGATIVE
Ketones, ur: NEGATIVE mg/dL
Leukocytes,Ua: NEGATIVE
Nitrite: NEGATIVE
Protein, ur: NEGATIVE mg/dL
Specific Gravity, Urine: 1.033 — ABNORMAL HIGH (ref 1.005–1.030)
Squamous Epithelial / HPF: NONE SEEN (ref 0–5)
WBC, UA: NONE SEEN WBC/hpf (ref 0–5)
pH: 8 (ref 5.0–8.0)

## 2022-03-18 LAB — MAGNESIUM: Magnesium: 2.2 mg/dL (ref 1.7–2.4)

## 2022-03-18 LAB — POTASSIUM: Potassium: 3.5 mmol/L (ref 3.5–5.1)

## 2022-03-18 MED ORDER — IOHEXOL 350 MG/ML SOLN
80.0000 mL | Freq: Once | INTRAVENOUS | Status: AC | PRN
  Administered 2022-03-18: 80 mL via INTRAVENOUS

## 2022-03-18 MED ORDER — SODIUM CHLORIDE 0.9 % IV SOLN
1.0000 g | INTRAVENOUS | Status: DC
  Administered 2022-03-18 – 2022-03-19 (×2): 1 g via INTRAVENOUS
  Filled 2022-03-18: qty 10
  Filled 2022-03-18: qty 1

## 2022-03-18 NOTE — Progress Notes (Signed)
?PROGRESS NOTE ? ? ? Logan Vazquez  YFV:[id number] DOB: Nov 18, 1946 DOA: 03/13/2022 ?PCP: Oswaldo Conroy, MD  ? ? ?Brief Narrative:  ?Spoke to patient today: patient reported falling over the weekned when his feet got twisted . He got up and another day as he was going to the kitchen he fell on the ground but doesn't remember. He was confused on admission. He was found by his aid.  ? ?3/30This am he is appropriate, MS improved.  ?3/31 confused this am. Needed restraints and mittens as he was pulling off IV ?4/1 confused this am. After speaking to him, he was interactive and agreeable to allowing ivf to run. Sitter at bedside. Transfer pt to SDU due to his ct findings of aneurysm for closer monitoring. ? ?4/2 no overnight issues.  Patient is awake and alert this AM.  Calm.  Answering questions appropriately.  Has been receiving IV fluids overnight.  He is doing much better now that he is hydrated. ?4/3 doing better since hydration. Answering questions appropriately. No overnight issues. Eating breakfast ? ?Consultants:  ?Cardiology vascular surgery ? ?Procedures:  ? ?Antimicrobials:  ?  ? ? ?Subjective: ?No sob, cp, abd pain. Nsg later reported pt c/o dysuria ? ?Objective: ?Vitals:  ? 03/18/22 1000 03/18/22 1100 03/18/22 1200 03/18/22 1300  ?BP: (!) 145/82 131/82 118/78 132/70  ?Pulse: 70 (!) 58 66 (!) 53  ?Resp: (!) 27 (!) 25 (!) 23 (!) 25  ?Temp:   98.2 ?F (36.8 ?C)   ?TempSrc:      ?SpO2: 97% 96% 95% 97%  ?Weight:      ?Height:      ? ? ?Intake/Output Summary (Last 24 hours) at 03/18/2022 1514 ?Last data filed at 03/18/2022 1100 ?Gross per 24 hour  ?Intake 1731.41 ml  ?Output 1400 ml  ?Net 331.41 ml  ? ?Filed Weights  ? 03/13/22 1519 03/16/22 1300  ?Weight: 57.2 kg 51.9 kg  ? ? ?Examination: ?Calm, NAD ?Cta no w/r ?Reg s1/s2 no gallop ?Soft benign +bs ?No edema ?Aaoxox3  ?Mood and affect appropriate in current setting  ? ? ? ?Data Reviewed: I have personally reviewed following labs and imaging  studies ? ?CBC: ?Recent Labs  ?Lab 03/13/22 ?1530 03/13/22 ?2118 03/17/22 ?0450  ?WBC 12.6* 11.9* 6.9  ?NEUTROABS 10.7*  --   --   ?HGB 10.8* 10.5* 10.6*  ?HCT 34.4* 32.9* 32.1*  ?MCV 94.0 92.9 89.7  ?PLT 276 259 251  ? ?Basic Metabolic Panel: ?Recent Labs  ?Lab 03/13/22 ?1530 03/13/22 ?2118 03/14/22 ?1057 03/16/22 ?1638 03/17/22 ?4665 03/18/22 ?0446  ?NA 137  --  137  --   --   --   ?K 4.2  --  3.7 2.9* 2.9* 3.5  ?CL 104  --  107  --   --   --   ?CO2 22  --  24  --   --   --   ?GLUCOSE 88  --  128*  --   --   --   ?BUN 43*  --  23  --   --   --   ?CREATININE 2.05* 1.52* 0.88 0.67  --   --   ?CALCIUM 8.1*  --  7.8*  --   --   --   ?MG  --   --   --   --  1.6* 2.2  ? ?GFR: ?Estimated Creatinine Clearance: 57.7 mL/min (by C-G formula based on SCr of 0.67 mg/dL). ?Liver Function Tests: ?Recent Labs  ?Lab 03/13/22 ?1530  ?  AST 41  ?ALT 19  ?ALKPHOS 86  ?BILITOT 1.1  ?PROT 6.7  ?ALBUMIN 3.7  ? ?No results for input(s): LIPASE, AMYLASE in the last 168 hours. ?No results for input(s): AMMONIA in the last 168 hours. ?Coagulation Profile: ?No results for input(s): INR, PROTIME in the last 168 hours. ?Cardiac Enzymes: ?Recent Labs  ?Lab 03/13/22 ?1530 03/14/22 ?1057 03/16/22 ?9937  ?CKTOTAL 1,690* 1,026* 288  ? ?BNP (last 3 results) ?No results for input(s): PROBNP in the last 8760 hours. ?HbA1C: ?No results for input(s): HGBA1C in the last 72 hours. ?CBG: ?Recent Labs  ?Lab 03/16/22 ?1300  ?GLUCAP 126*  ? ?Lipid Profile: ?No results for input(s): CHOL, HDL, LDLCALC, TRIG, CHOLHDL, LDLDIRECT in the last 72 hours. ?Thyroid Function Tests: ?No results for input(s): TSH, T4TOTAL, FREET4, T3FREE, THYROIDAB in the last 72 hours. ?Anemia Panel: ?No results for input(s): VITAMINB12, FOLATE, FERRITIN, TIBC, IRON, RETICCTPCT in the last 72 hours. ?Sepsis Labs: ?No results for input(s): PROCALCITON, LATICACIDVEN in the last 168 hours. ? ?Recent Results (from the past 240 hour(s))  ?Gastrointestinal Panel by PCR , Stool     Status: None   ? Collection Time: 03/15/22 10:00 AM  ? Specimen: Stool  ?Result Value Ref Range Status  ? Campylobacter species NOT DETECTED NOT DETECTED Final  ? Plesimonas shigelloides NOT DETECTED NOT DETECTED Final  ? Salmonella species NOT DETECTED NOT DETECTED Final  ? Yersinia enterocolitica NOT DETECTED NOT DETECTED Final  ? Vibrio species NOT DETECTED NOT DETECTED Final  ? Vibrio cholerae NOT DETECTED NOT DETECTED Final  ? Enteroaggregative E coli (EAEC) NOT DETECTED NOT DETECTED Final  ? Enteropathogenic E coli (EPEC) NOT DETECTED NOT DETECTED Final  ? Enterotoxigenic E coli (ETEC) NOT DETECTED NOT DETECTED Final  ? Shiga like toxin producing E coli (STEC) NOT DETECTED NOT DETECTED Final  ? Shigella/Enteroinvasive E coli (EIEC) NOT DETECTED NOT DETECTED Final  ? Cryptosporidium NOT DETECTED NOT DETECTED Final  ? Cyclospora cayetanensis NOT DETECTED NOT DETECTED Final  ? Entamoeba histolytica NOT DETECTED NOT DETECTED Final  ? Giardia lamblia NOT DETECTED NOT DETECTED Final  ? Adenovirus F40/41 NOT DETECTED NOT DETECTED Final  ? Astrovirus NOT DETECTED NOT DETECTED Final  ? Norovirus GI/GII NOT DETECTED NOT DETECTED Final  ? Rotavirus A NOT DETECTED NOT DETECTED Final  ? Sapovirus (I, II, IV, and V) NOT DETECTED NOT DETECTED Final  ?  Comment: Performed at [facility], [address] [place], [place] [address]  ?C Difficile Quick Screen w PCR reflex     Status: None  ? Collection Time: 03/15/22  3:20 PM  ? Specimen: STOOL  ?Result Value Ref Range Status  ? C Diff antigen NEGATIVE NEGATIVE Final  ? C Diff toxin NEGATIVE NEGATIVE Final  ? C Diff interpretation No C. difficile detected.  Final  ?  Comment: Performed at [facility], [address] [place], [place] [address]  ?MRSA Next Gen by PCR, Nasal     Status: None  ? Collection Time: 03/16/22 12:59 PM  ? Specimen: Nasal Mucosa; Nasal Swab  ?Result Value Ref Range Status  ? MRSA by PCR Next Gen NOT DETECTED NOT DETECTED Final  ?  Comment:  (NOTE) ?The GeneXpert MRSA Assay (FDA approved for NASAL specimens only), ?is one component of a comprehensive MRSA colonization surveillance ?program. It is not intended to diagnose MRSA infection nor to guide ?or monitor treatment for MRSA infections. ?Test performance is not FDA approved in patients less than 2 years ?old. ?Performed  at [facility], [address] [place], ?[place] [address] ?  ?  ? ? ? ? ? ?Radiology Studies: ?No results found. ? ? ? ? ? ?Scheduled Meds: ? amLODipine  10 mg Oral Daily  ? aspirin EC  81 mg Oral Daily  ? busPIRone  15 mg Oral BID  ? Chlorhexidine Gluconate Cloth  6 each Topical Daily  ? feeding supplement  237 mL Oral TID BM  ? FLUoxetine  40 mg Oral Daily  ? heparin  5,000 Units Subcutaneous Q8H  ? metoprolol tartrate  12.5 mg Oral BID  ? nicotine  14 mg Transdermal Daily  ? QUEtiapine  50 mg Oral QHS  ? ?Continuous Infusions: ? cefTRIAXone (ROCEPHIN)  IV    ? ? ?Assessment & Plan: ?  ?Principal Problem: ?  Fall at home, initial encounter ?Active Problems: ?  Essential hypertension ?  Rhabdomyolysis ?  Syncope and collapse ?  COPD (chronic obstructive pulmonary disease) (HCC) ?  Atherosclerotic heart disease of native coronary artery without angina pectoris ?  GERD (gastroesophageal reflux disease) ?  Chronic hip pain, left ?  AKI (acute kidney injury) ([facility]) ?  Anemia ? ? ?Rhabdomyolysis ?S/p FALL ?Syncope and collapse ?MRI without acute findings ? echo nml EF ?4/1 resume ivf. Patient did not get much fluids since was aggitated, confused, and pulled iv out. Now agreeable.  ?4/2 CK improved.   ?4/3 clinically improved with IV fluids.  Will DC fluids courage p.o. intake ? ? ? ? ? ?AKI ?From rhabdomyolysis ?4/3 improved and at baseline ? ? ?MS  change ?Likely due to dehydration ?Improved now and at baseline ?MRI and CT brain no acute findings ?4/1 he appears to be on the dry side.  Still confused.  This is likely exacerbating his confusion. ?Resume IV fluids ?Haldol as  needed ?Increase Seroquel to 50 nightly ?4/2 MS improved appears to be at baseline.  No overnight issues.  Certainly hydration has helped him.  We will continue for now until his p.o. intake improves ?4/3 at baseline. Doing well

## 2022-03-18 NOTE — Progress Notes (Signed)
[facility] ? ?Daily Progress Note ? ? ?Subjective  -  ? ?Logan Vazquez is a 76 year old male that presented to [facility] on 03/13/2022 following a fall.  After this fall he was unable to get up and laid on the floor for approximately 12 hours.  He was found to have traumatic rhabdomyolysis with no neurological or vascular defects at that time.  However, he began to have left lower quadrant abdominal pain and due to this a CT abdomen pelvis was ordered and discovered a 6 cm abdominal aortic aneurysm.  The patient was previously seen by our office in 2018 with a noted 3.6 cm aneurysm however he was lost to follow-up.  During the patient's hospitalization he has became confused however he was hydrated over the weekend and his mental status is now at baseline.  He is able to answer questions appropriately. ? ?Objective ?Vitals:  ? 03/18/22 1700 03/18/22 1800 03/18/22 2015 03/18/22 2100  ?BP: (!) 114/93 114/63 (!) 147/70 132/69  ?Pulse: 65 65 (!) 58 61  ?Resp: (!) 22 17 (!) 26 (!) 29  ?Temp:   98.3 ?F (36.8 ?C)   ?TempSrc:      ?SpO2: 96% 96% 96% 97%  ?Weight:      ?Height:      ? ? ?Intake/Output Summary (Last 24 hours) at 03/18/2022 2235 ?Last data filed at 03/18/2022 1813 ?Gross per 24 hour  ?Intake 1684.08 ml  ?Output 1550 ml  ?Net 134.08 ml  ? ? ?PULM  CTAB ?CV  RRR ?VASC  warm feet nonpalpable pulses ? ?Laboratory ?CBC ?   ?Component Value Date/Time  ? WBC 6.9 03/17/2022 0450  ? HGB 10.6 (L) 03/17/2022 0450  ? HCT 32.1 (L) 03/17/2022 0450  ? PLT 251 03/17/2022 0450  ? ? ?BMET ?   ?Component Value Date/Time  ? NA 137 03/14/2022 1057  ? K 3.5 03/18/2022 0446  ? CL 107 03/14/2022 1057  ? CO2 24 03/14/2022 1057  ? GLUCOSE 128 (H) 03/14/2022 1057  ? BUN 23 03/14/2022 1057  ? CREATININE 0.67 03/16/2022 0554  ? CREATININE 0.92 10/23/2017 1552  ? CALCIUM 7.8 (L) 03/14/2022 1057  ? GFRNONAA >60 03/16/2022 0554  ? ? ?Assessment/Planning: ?1.  Abdominal aortic aneurysm ?  ?Recommend:  The aneurysm is > 5 cm and therefore should undergo repair. Patient is status post CT scan of the abdominal aorta. The patient is a candidate for endovascular repair.  ?  ?The patient is much more lucid today and is able to answer questions appropriately.  We discussed the aneurysm itself as well as the risk, benefits and alternatives.  The patient does agree to proceed.  We will tentatively plan on endovascular repair on Wednesday, 03/20/2022.   ?  ? ?Georgiana Spinner ? ?03/18/2022, 10:35 PM ? ? ? ?  ?

## 2022-03-19 DIAGNOSIS — I10 Essential (primary) hypertension: Secondary | ICD-10-CM | POA: Diagnosis not present

## 2022-03-19 DIAGNOSIS — W19XXXA Unspecified fall, initial encounter: Secondary | ICD-10-CM | POA: Diagnosis not present

## 2022-03-19 DIAGNOSIS — Y92009 Unspecified place in unspecified non-institutional (private) residence as the place of occurrence of the external cause: Secondary | ICD-10-CM | POA: Diagnosis not present

## 2022-03-19 DIAGNOSIS — I7143 Infrarenal abdominal aortic aneurysm, without rupture: Secondary | ICD-10-CM

## 2022-03-19 LAB — TYPE AND SCREEN
ABO/RH(D): A POS
Antibody Screen: NEGATIVE

## 2022-03-19 LAB — CBC
HCT: 34.3 % — ABNORMAL LOW (ref 39.0–52.0)
Hemoglobin: 11.2 g/dL — ABNORMAL LOW (ref 13.0–17.0)
MCH: 29.7 pg (ref 26.0–34.0)
MCHC: 32.7 g/dL (ref 30.0–36.0)
MCV: 91 fL (ref 80.0–100.0)
Platelets: 305 10*3/uL (ref 150–400)
RBC: 3.77 MIL/uL — ABNORMAL LOW (ref 4.22–5.81)
RDW: 15.2 % (ref 11.5–15.5)
WBC: 10.7 10*3/uL — ABNORMAL HIGH (ref 4.0–10.5)
nRBC: 0 % (ref 0.0–0.2)

## 2022-03-19 LAB — BASIC METABOLIC PANEL
Anion gap: 6 (ref 5–15)
BUN: 18 mg/dL (ref 8–23)
CO2: 26 mmol/L (ref 22–32)
Calcium: 8.3 mg/dL — ABNORMAL LOW (ref 8.9–10.3)
Chloride: 109 mmol/L (ref 98–111)
Creatinine, Ser: 0.8 mg/dL (ref 0.61–1.24)
GFR, Estimated: >60 mL/min (ref >60)
Glucose, Bld: 96 mg/dL (ref 70–99)
Potassium: 4.2 mmol/L (ref 3.5–5.1)
Sodium: 141 mmol/L (ref 135–145)

## 2022-03-19 MED ORDER — LACTATED RINGERS IV SOLN
INTRAVENOUS | Status: DC
Start: 2022-03-19 — End: 2022-03-21

## 2022-03-19 MED ORDER — TIOTROPIUM BROMIDE MONOHYDRATE 18 MCG IN CAPS
18.0000 ug | ORAL_CAPSULE | Freq: Every day | RESPIRATORY_TRACT | Status: DC
  Administered 2022-03-20 – 2022-03-26 (×6): 18 ug via RESPIRATORY_TRACT
  Filled 2022-03-19 (×2): qty 5

## 2022-03-19 NOTE — Progress Notes (Signed)
?PROGRESS NOTE ? ? ? Logan Vazquez  ZMO:[id number] DOB: [birth year] DOA: 03/13/2022 ?PCP: Oswaldo Conroy, MD  ? ? ?Brief Narrative:  ?Spoke to patient today: patient reported falling over the weekned when his feet got twisted . He got up and another day as he was going to the kitchen he fell on the ground but doesn't remember. He was confused on admission. He was found by his aid.  ? ?Patient was found with dehydration and rhabdomyolysis.  Stone CT scan found with infrarenal aneurysm.  Vascular surgery was consulted.  Due to dehydration he was very confused with pulling on his IV lines and had to be placed on restraints.  Finally once we gave him IV fluids he is back to his baseline normal mental status and responding appropriately.  Vascular surgery plans to do EVAR. ? ?He was transferred to SDU for closer monitoring for his aneurysm. ? ?4/4 reached out to cardiology for cardiac risk stratification again ? ?Consultants:  ?Cardiology ,vascular surgery ? ?Procedures:  ? ?Antimicrobials:  ?  ? ? ?Subjective: ?No sob, cp, abd pain. Nsg later reported pt c/o dysuria ? ?Objective: ?Vitals:  ? 03/19/22 0300 03/19/22 0400 03/19/22 0500 03/19/22 0605  ?BP: (!) 146/91 137/62 129/67 126/71  ?Pulse: (!) 56 (!) 50 60 (!) 52  ?Resp: (!) 24 (!) 24 18 19   ?Temp:  98.7 ?F (37.1 ?C)    ?TempSrc:  Oral    ?SpO2: 97% 97% 94% 96%  ?Weight:      ?Height:      ? ? ?Intake/Output Summary (Last 24 hours) at 03/19/2022 0910 ?Last data filed at 03/19/2022 0300 ?Gross per 24 hour  ?Intake 964.08 ml  ?Output 1850 ml  ?Net -885.92 ml  ? ?Filed Weights  ? 03/13/22 1519 03/16/22 1300  ?Weight: 57.2 kg 51.9 kg  ? ? ?Examination: ?Calm, NAD ?Cta no w/r ?Reg s1/s2 no gallop ?Soft benign +bs ?No edema ?Aaoxox3  ?Mood and affect appropriate in current setting  ? ? ? ?Data Reviewed: I have personally reviewed following labs and imaging studies ? ?CBC: ?Recent Labs  ?Lab 03/13/22 ?1530 03/13/22 ?2118 03/17/22 ?0450 03/19/22 ?0354  ?WBC 12.6* 11.9* 6.9  10.7*  ?NEUTROABS 10.7*  --   --   --   ?HGB 10.8* 10.5* 10.6* 11.2*  ?HCT 34.4* 32.9* 32.1* 34.3*  ?MCV 94.0 92.9 89.7 91.0  ?PLT 276 259 251 305  ? ?Basic Metabolic Panel: ?Recent Labs  ?Lab 03/13/22 ?1530 03/13/22 ?2118 03/14/22 ?1057 03/16/22 ?6568 03/17/22 ?0450 03/18/22 ?0446 03/19/22 ?1275  ?NA 137  --  137  --   --   --  141  ?K 4.2  --  3.7 2.9* 2.9* 3.5 4.2  ?CL 104  --  107  --   --   --  109  ?CO2 22  --  24  --   --   --  26  ?GLUCOSE 88  --  128*  --   --   --  96  ?BUN 43*  --  23  --   --   --  18  ?CREATININE 2.05* 1.52* 0.88 0.67  --   --  0.80  ?CALCIUM 8.1*  --  7.8*  --   --   --  8.3*  ?MG  --   --   --   --  1.6* 2.2  --   ? ?GFR: ?Estimated Creatinine Clearance: 57.7 mL/min (by C-G formula based on SCr of 0.8 mg/dL). ?Liver Function Tests: ?  Recent Labs  ?Lab 03/13/22 ?1530  ?AST 41  ?ALT 19  ?ALKPHOS 86  ?BILITOT 1.1  ?PROT 6.7  ?ALBUMIN 3.7  ? ?No results for input(s): LIPASE, AMYLASE in the last 168 hours. ?No results for input(s): AMMONIA in the last 168 hours. ?Coagulation Profile: ?No results for input(s): INR, PROTIME in the last 168 hours. ?Cardiac Enzymes: ?Recent Labs  ?Lab 03/13/22 ?1530 03/14/22 ?1057 03/16/22 ?0962  ?CKTOTAL 1,690* 1,026* 288  ? ?BNP (last 3 results) ?No results for input(s): PROBNP in the last 8760 hours. ?HbA1C: ?No results for input(s): HGBA1C in the last 72 hours. ?CBG: ?Recent Labs  ?Lab 03/16/22 ?1300  ?GLUCAP 126*  ? ?Lipid Profile: ?No results for input(s): CHOL, HDL, LDLCALC, TRIG, CHOLHDL, LDLDIRECT in the last 72 hours. ?Thyroid Function Tests: ?No results for input(s): TSH, T4TOTAL, FREET4, T3FREE, THYROIDAB in the last 72 hours. ?Anemia Panel: ?No results for input(s): VITAMINB12, FOLATE, FERRITIN, TIBC, IRON, RETICCTPCT in the last 72 hours. ?Sepsis Labs: ?No results for input(s): PROCALCITON, LATICACIDVEN in the last 168 hours. ? ?Recent Results (from the past 240 hour(s))  ?Gastrointestinal Panel by PCR , Stool     Status: None  ? Collection Time:  03/15/22 10:00 AM  ? Specimen: Stool  ?Result Value Ref Range Status  ? Campylobacter species NOT DETECTED NOT DETECTED Final  ? Plesimonas shigelloides NOT DETECTED NOT DETECTED Final  ? Salmonella species NOT DETECTED NOT DETECTED Final  ? Yersinia enterocolitica NOT DETECTED NOT DETECTED Final  ? Vibrio species NOT DETECTED NOT DETECTED Final  ? Vibrio cholerae NOT DETECTED NOT DETECTED Final  ? Enteroaggregative E coli (EAEC) NOT DETECTED NOT DETECTED Final  ? Enteropathogenic E coli (EPEC) NOT DETECTED NOT DETECTED Final  ? Enterotoxigenic E coli (ETEC) NOT DETECTED NOT DETECTED Final  ? Shiga like toxin producing E coli (STEC) NOT DETECTED NOT DETECTED Final  ? Shigella/Enteroinvasive E coli (EIEC) NOT DETECTED NOT DETECTED Final  ? Cryptosporidium NOT DETECTED NOT DETECTED Final  ? Cyclospora cayetanensis NOT DETECTED NOT DETECTED Final  ? Entamoeba histolytica NOT DETECTED NOT DETECTED Final  ? Giardia lamblia NOT DETECTED NOT DETECTED Final  ? Adenovirus F40/41 NOT DETECTED NOT DETECTED Final  ? Astrovirus NOT DETECTED NOT DETECTED Final  ? Norovirus GI/GII NOT DETECTED NOT DETECTED Final  ? Rotavirus A NOT DETECTED NOT DETECTED Final  ? Sapovirus (I, II, IV, and V) NOT DETECTED NOT DETECTED Final  ?  Comment: Performed at [facility], [address], [place], [place] [address]  ?C Difficile Quick Screen w PCR reflex     Status: None  ? Collection Time: 03/15/22  3:20 PM  ? Specimen: STOOL  ?Result Value Ref Range Status  ? C Diff antigen NEGATIVE NEGATIVE Final  ? C Diff toxin NEGATIVE NEGATIVE Final  ? C Diff interpretation No C. difficile detected.  Final  ?  Comment: Performed at [facility], [address] [place], [place] [address]  ?MRSA Next Gen by PCR, Nasal     Status: None  ? Collection Time: 03/16/22 12:59 PM  ? Specimen: Nasal Mucosa; Nasal Swab  ?Result Value Ref Range Status  ? MRSA by PCR Next Gen NOT DETECTED NOT DETECTED Final  ?  Comment: (NOTE) ?The GeneXpert MRSA  Assay (FDA approved for NASAL specimens only), ?is one component of a comprehensive MRSA colonization surveillance ?program. It is not intended to diagnose MRSA infection nor to guide ?or monitor treatment for MRSA infections. ?Test performance is not FDA approved in  patients less than 2 years ?old. ?Performed at [facility], [address] [place], ?[place] [address] ?  ?  ? ? ? ? ? ?Radiology Studies: ?No results found. ? ? ? ? ? ?Scheduled Meds: ? amLODipine  10 mg Oral Daily  ? aspirin EC  81 mg Oral Daily  ? busPIRone  15 mg Oral BID  ? Chlorhexidine Gluconate Cloth  6 each Topical Daily  ? feeding supplement  237 mL Oral TID BM  ? FLUoxetine  40 mg Oral Daily  ? heparin  5,000 Units Subcutaneous Q8H  ? metoprolol tartrate  12.5 mg Oral BID  ? nicotine  14 mg Transdermal Daily  ? QUEtiapine  50 mg Oral QHS  ? ?Continuous Infusions: ? cefTRIAXone (ROCEPHIN)  IV Stopped (03/18/22 1526)  ? ? ?Assessment & Plan: ?  ?Principal Problem: ?  Fall at home, initial encounter ?Active Problems: ?  Essential hypertension ?  Rhabdomyolysis ?  Syncope and collapse ?  COPD (chronic obstructive pulmonary disease) (HCC) ?  Atherosclerotic heart disease of native coronary artery without angina pectoris ?  GERD (gastroesophageal reflux disease) ?  Chronic hip pain, left ?  AKI (acute kidney injury) (HCC) ?  Anemia ? ? ?Rhabdomyolysis ?S/p FALL ?Syncope and collapse ?MRI without acute findings ? echo nml EF ?4/1 resume ivf. Patient did not get much fluids since was aggitated, confused, and pulled iv out. Now agreeable.  ?4/4 improved with IV fluids ? ? ? ? ? ?AKI ?From rhabdomyolysis ?4/4 improved with IV fluids ? ? ? ?MS  change ?Likely due to dehydration ?Improved now and at baseline ?MRI and CT brain no acute findings ?4/1 he appears to be on the dry side.  Still confused.  This is likely exacerbating his confusion. ?Resume IV fluids ?Haldol as needed ?Increase Seroquel to 50 nightly ?4/4 now that he is hydrated, his  MS has improve and at baseline. ? ? ?Infrarenal aneurysm -6.0 cm ?Found on CT for work-up of left lower quadrant abdominal pain LLQ abd pain ?Vascular recommended preop evaluation..> reached to cardio
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
## 2022-03-20 ENCOUNTER — Inpatient Hospital Stay (HOSPITAL_COMMUNITY): Payer: Medicare Other

## 2022-03-20 DIAGNOSIS — Y92009 Unspecified place in unspecified non-institutional (private) residence as the place of occurrence of the external cause: Secondary | ICD-10-CM | POA: Diagnosis not present

## 2022-03-20 DIAGNOSIS — I714 Abdominal aortic aneurysm, without rupture, unspecified: Secondary | ICD-10-CM

## 2022-03-20 DIAGNOSIS — Z01810 Encounter for preprocedural cardiovascular examination: Secondary | ICD-10-CM | POA: Insufficient documentation

## 2022-03-20 DIAGNOSIS — T796XXA Traumatic ischemia of muscle, initial encounter: Secondary | ICD-10-CM | POA: Diagnosis not present

## 2022-03-20 DIAGNOSIS — W19XXXA Unspecified fall, initial encounter: Secondary | ICD-10-CM | POA: Diagnosis not present

## 2022-03-20 DIAGNOSIS — I2510 Atherosclerotic heart disease of native coronary artery without angina pectoris: Secondary | ICD-10-CM

## 2022-03-20 DIAGNOSIS — I10 Essential (primary) hypertension: Secondary | ICD-10-CM | POA: Diagnosis not present

## 2022-03-20 DIAGNOSIS — E876 Hypokalemia: Secondary | ICD-10-CM

## 2022-03-20 LAB — NM MYOCAR MULTI W/SPECT W/WALL MOTION / EF
LV dias vol: 46 mL (ref 62–150)
LV sys vol: 18 mL
MPHR: 144 {beats}/min
Nuc Stress EF: 61 %
Peak HR: 76 {beats}/min
Percent HR: 52 %
Rest HR: 56 {beats}/min
Rest Nuclear Isotope Dose: 9.7 mCi
SDS: 0
SRS: 4
SSS: 0
ST Depression (mm): 0 mm
Stress Nuclear Isotope Dose: 31.1 mCi
TID: 1

## 2022-03-20 LAB — URINE CULTURE: Culture: <10000 — AB

## 2022-03-20 MED ORDER — REGADENOSON 0.4 MG/5ML IV SOLN
0.4000 mg | Freq: Once | INTRAVENOUS | Status: AC
  Administered 2022-03-20: 0.4 mg via INTRAVENOUS

## 2022-03-20 MED ORDER — ATORVASTATIN CALCIUM 20 MG PO TABS
40.0000 mg | ORAL_TABLET | Freq: Every day | ORAL | Status: DC
  Administered 2022-03-20 – 2022-03-26 (×7): 40 mg via ORAL
  Filled 2022-03-20 (×7): qty 2

## 2022-03-20 MED ORDER — TECHNETIUM TC 99M TETROFOSMIN IV KIT
31.1200 | PACK | Freq: Once | INTRAVENOUS | Status: AC | PRN
  Administered 2022-03-20: 31.12 via INTRAVENOUS

## 2022-03-20 MED ORDER — ADULT MULTIVITAMIN W/MINERALS CH
1.0000 | ORAL_TABLET | Freq: Every day | ORAL | Status: DC
  Administered 2022-03-21 – 2022-03-26 (×6): 1 via ORAL
  Filled 2022-03-20 (×6): qty 1

## 2022-03-20 MED ORDER — TECHNETIUM TC 99M TETROFOSMIN IV KIT
10.0000 | PACK | Freq: Once | INTRAVENOUS | Status: AC | PRN
  Administered 2022-03-20: 9.65 via INTRAVENOUS

## 2022-03-20 NOTE — Progress Notes (Addendum)
?  Progress Note ? ? ?Patient: Logan Vazquez YKD:[id number] DOB: [birth year] DOA: 03/13/2022     6 ?DOS: the patient was seen and examined on 03/20/2022 ?  ?Brief hospital course: ?DELMAR ARRIAGA is a 76 y.o. male with medical history significant of  CAD, copd, htn, seen in ed for fall pt fell off bed.  He had acute renal failure with rhabdomyolysis.  Received IV fluids.  Condition has resolved afterwards. ?Patient was also found to have aortic arterial dilation of 6.2 cm. ?Patient had stress nuclear test 4/5, deemed to be a lower risk.  Patient is scheduled for EAVR per Dr. Wyn Quaker ? ?Assessment and Plan: ?* Fall at home, initial encounter ?Continue PT/OT. ? ?Rhabdomyolysis ?Pt has cpk of  1690. ?Condition improved after giving IV fluids. ? ? ?Essential hypertension ?Continue Norvasc. ? ? ?Syncope and collapse ?MRI of the brain did not show any acute changes.  Patient had some dehydration which has resolved after giving fluids ? ? ? ?Atherosclerotic heart disease of native coronary artery without angina pectoris ?Stable and no chest pain.  ? ?\ ? ?COPD (chronic obstructive pulmonary disease) (HCC) ?No bronchospasm. ? ?Chronic hip pain, left ?Secondary to osteoarthritis.  As needed pain medicine. ? ?GERD (gastroesophageal reflux disease) ?Cont ppi therapy.  ? ? ?Hypokalemia ?Potassium has normalized ? ?Pre-operative cardiovascular examination ?Patient has been eval by cardiology, nuclear stress test showed low cardiac risk. ? ?Coronary artery disease involving native coronary artery of native heart without angina pectoris ?Stable. ? ?Anemia ?Hemoglobin stable. ? ? ? ?AKI (acute kidney injury) (HCC) ? ?Renal function has normalized ? ? ? ?Abdominal aortic aneurysm. ?Pending decision for surgery either today or next week.  Discussed with Dr. Belva Agee, he will try to get the patient to surgery tonight.  If not, patient probably will be discharged home and come back to as outpatient to do surgery. ? ? ?  ? ?Subjective:  ?Patient doing  well today, currently patient has no complaints. ? ?Physical Exam: ?Vitals:  ? 03/20/22 1200 03/20/22 1300 03/20/22 1400 03/20/22 1500  ?BP: 124/67 123/63 128/66 138/66  ?Pulse: 61 (!) 58 (!) 58   ?Resp: (!) 23 20 13 15   ?Temp: 98.1 ?F (36.7 ?C)     ?TempSrc: Oral     ?SpO2: 97% 96% 95%   ?Weight:      ?Height:      ? ?General exam: Appears calm and comfortable  ?Respiratory system: Clear to auscultation. Respiratory effort normal. ?Cardiovascular system: S1 & S2 heard, RRR. No JVD, murmurs, rubs, gallops or clicks. No pedal edema. ?Gastrointestinal system: Abdomen is nondistended, soft and nontender. No organomegaly or masses felt. Normal bowel sounds heard. ?Central nervous system: Alert and oriented. No focal neurological deficits. ?Extremities: Symmetric 5 x 5 power. ?Skin: No rashes, lesions or ulcers ?Psychiatry: Judgement and insight appear normal. Mood & affect appropriate.  ? ?Data Reviewed: ? ?Reviewed stress test results, all lab results. ? ?Family Communication:Sister Updated at bedside. ? ?Disposition: ?Status is: Inpatient ?Remains inpatient appropriate because: Pending inpatient procedure. ? Planned Discharge Destination: Home with Home Health ? ? ? ?Time spent: 26 minutes ? ?Author: ? , MD ?03/20/2022 4:30 PM ? ?For on call review [email or web address].  ?

## 2022-03-20 NOTE — Progress Notes (Signed)
? ?Progress Note ? ?Patient Name: Logan Vazquez ?Date of Encounter: 03/20/2022 ? ?[facility] Cardiologist: Dr. Saunders Revel ? ?Subjective  ? ?Denies chest pain or shortness of breath.  Stress test being planned today. ? ?Inpatient Medications  ?  ?Scheduled Meds: ? amLODipine  10 mg Oral Daily  ? aspirin EC  81 mg Oral Daily  ? busPIRone  15 mg Oral BID  ? Chlorhexidine Gluconate Cloth  6 each Topical Daily  ? feeding supplement  237 mL Oral TID BM  ? FLUoxetine  40 mg Oral Daily  ? heparin  5,000 Units Subcutaneous Q8H  ? metoprolol tartrate  12.5 mg Oral BID  ? [START ON 03/21/2022] multivitamin with minerals  1 tablet Oral Daily  ? nicotine  14 mg Transdermal Daily  ? QUEtiapine  50 mg Oral QHS  ? tiotropium  18 mcg Inhalation Daily  ? ?Continuous Infusions: ? lactated ringers 50 mL/hr at 03/20/22 1300  ? ?PRN Meds: ?albuterol, haloperidol lactate, labetalol, oxyCODONE **AND** oxyCODONE-acetaminophen, technetium tetrofosmin  ? ?Vital Signs  ?  ?Vitals:  ? 03/20/22 1000 03/20/22 1100 03/20/22 1200 03/20/22 1300  ?BP: 135/74 137/67 124/67 123/63  ?Pulse: 63 (!) 58 61 (!) 58  ?Resp: (!) 25 16 (!) 23 20  ?Temp:   98.1 ?F (36.7 ?C)   ?TempSrc:   Oral   ?SpO2: 98% 97% 97% 96%  ?Weight:      ?Height:      ? ? ?Intake/Output Summary (Last 24 hours) at 03/20/2022 1422 ?Last data filed at 03/20/2022 1300 ?Gross per 24 hour  ?Intake 1354.79 ml  ?Output 2550 ml  ?Net -1195.21 ml  ? ? ?  03/16/2022  ?  1:00 PM 03/13/2022  ?  3:19 PM 02/28/2022  ?  2:13 PM  ?Last 3 Weights  ?Weight (lbs) 114 lb 6.7 oz 126 lb 108 lb  ?Weight (kg) 51.9 kg 57.153 kg 48.988 kg  ?   ? ?Telemetry  ?  ?Sinus bradycardia, heart rate 58- Personally Reviewed ? ?ECG  ?  ? - Personally Reviewed ? ?Physical Exam  ? ?GEN: No acute distress.   ?Neck: No JVD ?Cardiac: RRR, no murmurs, rubs, or gallops.  ?Respiratory: Diminished breath sounds at bases, no wheezing ?GI: Soft, nontender, non-distended  ?MS: No edema; No deformity. ?Neuro:  Nonfocal  ?Psych: Normal affect   ? ?Labs  ?  ?High Sensitivity Troponin:  No results for input(s): TROPONINIHS in the last 720 hours.   ?Chemistry ?Recent Labs  ?Lab 03/13/22 ?1530 03/13/22 ?2118 03/14/22 ?1057 03/16/22 ?[id number] 03/17/22 ?0450 03/18/22 ?0446 03/19/22 ?[id number]  ?NA 137  --  137  --   --   --  141  ?K 4.2  --  3.7 2.9* 2.9* 3.5 4.2  ?CL 104  --  107  --   --   --  109  ?CO2 22  --  24  --   --   --  26  ?GLUCOSE 88  --  128*  --   --   --  96  ?BUN 43*  --  23  --   --   --  18  ?CREATININE 2.05*   < > 0.88 0.67  --   --  0.80  ?CALCIUM 8.1*  --  7.8*  --   --   --  8.3*  ?MG  --   --   --   --  1.6* 2.2  --   ?PROT 6.7  --   --   --   --   --   --   ?  ALBUMIN 3.7  --   --   --   --   --   --   ?AST 41  --   --   --   --   --   --   ?ALT 19  --   --   --   --   --   --   ?ALKPHOS 86  --   --   --   --   --   --   ?BILITOT 1.1  --   --   --   --   --   --   ?GFRNONAA 33*   < > >60 >60  --   --  >60  ?ANIONGAP 11  --  6  --   --   --  6  ? < > = values in this interval not displayed.  ?  ?Lipids No results for input(s): CHOL, TRIG, HDL, LABVLDL, LDLCALC, CHOLHDL in the last 168 hours.  ?Hematology ?Recent Labs  ?Lab 03/13/22 ?2118 03/17/22 ?0450 03/19/22 ?[id number]  ?WBC 11.9* 6.9 10.7*  ?RBC 3.54* 3.58* 3.77*  ?HGB 10.5* 10.6* 11.2*  ?HCT 32.9* 32.1* 34.3*  ?MCV 92.9 89.7 91.0  ?MCH 29.7 29.6 29.7  ?MCHC 31.9 33.0 32.7  ?RDW 14.8 14.7 15.2  ?PLT 259 251 305  ? ?Thyroid No results for input(s): TSH, FREET4 in the last 168 hours.  ?BNPNo results for input(s): BNP, PROBNP in the last 168 hours.  ?DDimer No results for input(s): DDIMER in the last 168 hours.  ? ?Radiology  ?  ?CT Angio Abd/Pel w/ and/or w/o ? ?Result Date: 03/19/2022 ?CLINICAL DATA:  Abdominal aortic aneurysm, preop planning for stent graft EXAM: CTA ABDOMEN AND PELVIS WITHOUT AND WITH CONTRAST TECHNIQUE: Multidetector CT imaging of the abdomen and pelvis was performed using the standard protocol during bolus administration of intravenous contrast. Multiplanar reconstructed images and  MIPs were obtained and reviewed to evaluate the vascular anatomy. RADIATION DOSE REDUCTION: This exam was performed according to the departmental dose-optimization program which includes automated exposure control, adjustment of the mA and/or kV according to patient size and/or use of iterative reconstruction technique. CONTRAST:  57mL OMNIPAQUE IOHEXOL 350 MG/ML SOLN COMPARISON:  03/14/2022 and previous FINDINGS: VASCULAR Aorta: Moderate partially calcified atheromatous plaque. 3.3 cm diameter supraceliac segment, 3.4 cm juxtarenal, dominant fusiform 6.2 x 5.9 cm infrarenal aneurysm. There is eccentric nonocclusive mural thrombus in the infrarenal segment. No extravasation or stigmata of impending rupture. Celiac: Patent without evidence of aneurysm, dissection, vasculitis or significant stenosis. SMA: Patent without evidence of aneurysm, dissection, vasculitis or significant stenosis. Replaced right hepatic arterial supply, an anatomic variant. Renals: Duplicated left, Co dominant, both with partially calcified ostial plaque resulting in short segment stenosis of at least mild severity. Single right, with calcified ostial plaque resulting in short segment stenosis of at least mild severity. IMA: Patent, arising from aneurysmal segment Inflow: Right common iliac 2 cm fusiform aneurysm with some eccentric nonocclusive mural thrombus. 1.9 cm fusiform aneurysm of left common iliac. Calcified atheromatous plaque and bilateral internal iliac and proximal external iliac arteries. Proximal Outflow: Atheromatous, patent. Veins: No obvious venous abnormality within the limitations of this arterial phase study. Review of the MIP images confirms the above findings. NON-VASCULAR Lower chest: No pleural or pericardial effusion. Coronary calcifications. Subpleural pulmonary fibrosis with dependent consolidation/atelectasis posteriorly in the lower lobes, right greater than left. Hepatobiliary: No focal liver abnormality is seen.  Status post cholecystectomy. No biliary dilatation. Pancreas: Unremarkable. No pancreatic ductal dilatation  or surrounding inflammatory changes. Spleen: Normal in size without focal abnormality. Adrenals/Urinary Tract: No adrenal mass. 5.1 cm simple appearing mid left renal cyst which does not require follow-up. No hydronephrosis. Urinary bladder is distended, mildly thick-walled. Stomach/Bowel: Stomach is nondistended, unremarkable. Small bowel decompressed. Normal appendix. The colon is nondilated, unremarkable. Lymphatic: No abdominal or pelvic adenopathy. Reproductive: Prostate enlargement with scattered coarse calcifications Other: Bilateral pelvic phleboliths.  No ascites.  No free air. Musculoskeletal: Stable subacute T10 compression deformity. Bridging osteophytes across T12-L1 interspace. Solid-appearing instrumented posterior fusion L3-S1. No acute findings. IMPRESSION: 1. Negative for retroperitoneal hemorrhage or other acute finding. 2. Diffuse dilatation of abdominal aorta with dominant fusiform 6.2 cm infrarenal aneurysm. Recommend referral to a vascular specialist. Reference: J Am Coll Radiol 2013;10:789-794. 3. Stable subacute T10 compression fracture deformity 4. Distended thick-walled urinary bladder with prostate enlargement. Electronically Signed   By: Lucrezia Europe M.D.   On: 03/19/2022 13:14   ? ?Cardiac Studies  ? ?TTE 03/14/2022 ?1. Left ventricular ejection fraction, by estimation, is 65 to 70%. The  ?left ventricle has normal function. The left ventricle has no regional  ?wall motion abnormalities. Left ventricular diastolic parameters are  ?consistent with Grade I diastolic  ?dysfunction (impaired relaxation).  ? 2. Right ventricular systolic function is normal. The right ventricular  ?size is normal.  ? 3. The mitral valve is normal in structure. Mild mitral valve  ?regurgitation. No evidence of mitral stenosis.  ? 4. The aortic valve is normal in structure. Aortic valve regurgitation is   ?moderate. No aortic stenosis is present.  ? 5. The inferior vena cava is normal in size with greater than 50%  ?respiratory variability, suggesting right atrial pressure of 3 mmHg.  ? ?Patient Profile  ?

## 2022-03-20 NOTE — Assessment & Plan Note (Addendum)
Stable, no symptoms

## 2022-03-20 NOTE — Progress Notes (Signed)
[facility] ? ?Daily Progress Note ? ? ?Subjective  -  ? ?Notified by bedside RN that he did not recall any previous discussion regarding intervention on his abdominal aortic aneurysm.  Patient had been previously confused with seemingly improved cognition.  Today patient appears to be more oriented as well.  The patient also recently underwent a CT angiogram which reveals evidence of aneurysms in the right common and left common iliac as well as the known abdominal aortic aneurysm.  We continue to await cardiology work-up for planned endovascular aortic repair. ? ?Objective ?Vitals:  ? 03/19/22 2100 03/19/22 2200 03/19/22 2343 03/20/22 0000  ?BP: 122/90 136/69 124/68 115/65  ?Pulse: 67 (!) 57 (!) 52 (!) 56  ?Resp:  (!) 24 (!) 22 20  ?Temp:      ?TempSrc:      ?SpO2: 98% 98% 98% 98%  ?Weight:      ?Height:      ? ? ?Intake/Output Summary (Last 24 hours) at 03/20/2022 0046 ?Last data filed at 03/19/2022 2300 ?Gross per 24 hour  ?Intake 655.58 ml  ?Output 1450 ml  ?Net -794.42 ml  ? ? ?PULM  CTAB ?CV  RRR ?VASC  nonpalpable pulses bilaterally ? ?Laboratory ?CBC ?   ?Component Value Date/Time  ? WBC 10.7 (H) 03/19/2022 0534  ? HGB 11.2 (L) 03/19/2022 0534  ? HCT 34.3 (L) 03/19/2022 0534  ? PLT 305 03/19/2022 0534  ? ? ?BMET ?   ?Component Value Date/Time  ? NA 141 03/19/2022 0534  ? K 4.2 03/19/2022 0534  ? CL 109 03/19/2022 0534  ? CO2 26 03/19/2022 0534  ? GLUCOSE 96 03/19/2022 0534  ? BUN 18 03/19/2022 0534  ? CREATININE 0.80 03/19/2022 0534  ? CREATININE 0.92 10/23/2017 1552  ? CALCIUM 8.3 (L) 03/19/2022 0534  ? GFRNONAA >60 03/19/2022 0534  ? ? ?Assessment/Planning: ?Discussed endovascular aortic repair with the patient again as well as reviewed the risk, benefits and alternatives.  This conversation witnessed by bedside RN.  We will continue to await cardiovascular risk evaluation with stress test planned for 03/20/2022.  The patient is agreeable to proceed postcardiac evaluation. ? ?Georgiana Spinner ? ?03/20/2022, 12:46 AM ? ? ? ?  ?

## 2022-03-20 NOTE — Assessment & Plan Note (Addendum)
Secondary to osteoarthritis.  Continue symptomatic treatment. ?

## 2022-03-20 NOTE — Progress Notes (Signed)
1000: Patient alert and oriented x4, without complaints at this time. Patient acknowledging stress test later today and endorses NPO status since midnight. Patient on room air, maintaining oxygen saturation above 95%. Patient states the external catheter is keeping him dry, and does not want assistance to reposition at this time. Patient reeducated on pressure injury prevention and frequent position changes, verbalizes understanding. Patient encourage to shift in bed and request assistance if needed, patient endorses ability to reposition independently. Patient states he washed up overnight and does not want bath at this time.  ?1220: Patient sister bedside, updated on plan of care. Patient complaining of a headache, medicated (per MAR).  ? ?1330: Patient transported off unit for NM stress test. Discussed plan of care with sister who has concerns re patient discharge as he lives alone and does not have help at home, though she is able to check in on him.  ? ?1540: Patient returned from NM stress test, requesting to eat and drink. NPO order still in place, secure chat sent to MD.  ? ?1600: Per MD okay to resume prev diet. Order placed, ensure & snacks provided to patient.  ?

## 2022-03-20 NOTE — Progress Notes (Signed)
Patient alert and oriented x4, intermittently confused to time though easily reoriented. Patient offered assistance to reposition and educated on pressure injury prevention, patient endorses ability to shift weight independently and frequently refuses assistance to reposition. When offered, patient hesitant about getting out of bed with assistance and walker however was assisted to chair without any issue. CHG bath given, complete linen and gown changed completed. When attempting to consent patient, patient states that the surgical team has not been by to provide details of procedure. Surgical NP aware, and to visit patient and discuss procedure. Cardiology rounded on patient and informed patient of plan for stress test prior to AAA repair, patient verbalizes understanding and agrees with plan. Patient assisted back to bed with walker, no complaints at this time.  ?

## 2022-03-20 NOTE — Hospital Course (Addendum)
Logan Vazquez is a 76 y.o. male with medical history significant of  CAD, copd, htn, seen in ed for fall pt fell off bed.  He had acute renal failure with rhabdomyolysis.  Received IV fluids.  Condition has resolved afterwards. ?Patient was also found to have aortic arterial dilation of 6.2 cm. ?Patient had stress nuclear test 4/5, deemed to be a lower risk.   EAVR performed 4/7.  ?Patient has been seen by PT/OT, recommended nursing placement.  Pending placement.  Patient is medically stable to be transferred. ?

## 2022-03-20 NOTE — Assessment & Plan Note (Addendum)
Patient has been eval by cardiology, nuclear stress test showed low cardiac risk.  Patient did well with surgery. ?

## 2022-03-20 NOTE — Assessment & Plan Note (Addendum)
Resolved

## 2022-03-21 DIAGNOSIS — T796XXA Traumatic ischemia of muscle, initial encounter: Secondary | ICD-10-CM | POA: Diagnosis not present

## 2022-03-21 DIAGNOSIS — W19XXXA Unspecified fall, initial encounter: Secondary | ICD-10-CM | POA: Diagnosis not present

## 2022-03-21 DIAGNOSIS — I7143 Infrarenal abdominal aortic aneurysm, without rupture: Secondary | ICD-10-CM | POA: Diagnosis not present

## 2022-03-21 DIAGNOSIS — Y92009 Unspecified place in unspecified non-institutional (private) residence as the place of occurrence of the external cause: Secondary | ICD-10-CM | POA: Diagnosis not present

## 2022-03-21 LAB — CBC WITH DIFFERENTIAL/PLATELET
Abs Immature Granulocytes: 0.08 10*3/uL — ABNORMAL HIGH (ref 0.00–0.07)
Basophils Absolute: 0.1 10*3/uL (ref 0.0–0.1)
Basophils Relative: 1 %
Eosinophils Absolute: 0.3 10*3/uL (ref 0.0–0.5)
Eosinophils Relative: 3 %
HCT: 31.8 % — ABNORMAL LOW (ref 39.0–52.0)
Hemoglobin: 10.4 g/dL — ABNORMAL LOW (ref 13.0–17.0)
Immature Granulocytes: 1 %
Lymphocytes Relative: 38 %
Lymphs Abs: 3.2 10*3/uL (ref 0.7–4.0)
MCH: 29.7 pg (ref 26.0–34.0)
MCHC: 32.7 g/dL (ref 30.0–36.0)
MCV: 90.9 fL (ref 80.0–100.0)
Monocytes Absolute: 0.7 10*3/uL (ref 0.1–1.0)
Monocytes Relative: 9 %
Neutro Abs: 4.1 10*3/uL (ref 1.7–7.7)
Neutrophils Relative %: 48 %
Platelets: 362 10*3/uL (ref 150–400)
RBC: 3.5 MIL/uL — ABNORMAL LOW (ref 4.22–5.81)
RDW: 15.3 % (ref 11.5–15.5)
WBC: 8.4 10*3/uL (ref 4.0–10.5)
nRBC: 0 % (ref 0.0–0.2)

## 2022-03-21 LAB — BASIC METABOLIC PANEL
Anion gap: 5 (ref 5–15)
BUN: 24 mg/dL — ABNORMAL HIGH (ref 8–23)
CO2: 26 mmol/L (ref 22–32)
Calcium: 8 mg/dL — ABNORMAL LOW (ref 8.9–10.3)
Chloride: 105 mmol/L (ref 98–111)
Creatinine, Ser: 0.67 mg/dL (ref 0.61–1.24)
GFR, Estimated: >60 mL/min (ref >60)
Glucose, Bld: 98 mg/dL (ref 70–99)
Potassium: 4.4 mmol/L (ref 3.5–5.1)
Sodium: 136 mmol/L (ref 135–145)

## 2022-03-21 LAB — MAGNESIUM: Magnesium: 1.8 mg/dL (ref 1.7–2.4)

## 2022-03-21 MED ORDER — CHLORHEXIDINE GLUCONATE CLOTH 2 % EX PADS: 6.0000 | MEDICATED_PAD | Freq: Once | CUTANEOUS | Status: AC

## 2022-03-21 MED ORDER — CEFAZOLIN SODIUM-DEXTROSE 2-4 GM/100ML-% IV SOLN
2.0000 g | INTRAVENOUS | Status: AC
  Administered 2022-03-22: 2 g via INTRAVENOUS
  Filled 2022-03-21: qty 100

## 2022-03-21 MED ORDER — CHLORHEXIDINE GLUCONATE CLOTH 2 % EX PADS
6.0000 | MEDICATED_PAD | Freq: Once | CUTANEOUS | Status: AC
  Administered 2022-03-21: 6 via TOPICAL

## 2022-03-21 MED ORDER — ENOXAPARIN SODIUM 40 MG/0.4ML IJ SOSY
40.0000 mg | PREFILLED_SYRINGE | Freq: Every day | INTRAMUSCULAR | Status: DC
Start: 2022-03-21 — End: 2022-03-26
  Administered 2022-03-22 – 2022-03-25 (×4): 40 mg via SUBCUTANEOUS
  Filled 2022-03-21 (×4): qty 0.4

## 2022-03-21 NOTE — Plan of Care (Signed)
  Problem: Education: Goal: Knowledge of General Education information will improve Description Including pain rating scale, medication(s)/side effects and non-pharmacologic comfort measures Outcome: Progressing   

## 2022-03-21 NOTE — Progress Notes (Signed)
[facility] ? ?Daily Progress Note ? ? ?Subjective  -  ? ?No complaints or major events today. For OR tomorrow am ? ?Objective ?Vitals:  ? 03/21/22 1200 03/21/22 1300 03/21/22 1400 03/21/22 1500  ?BP:   113/68 92/64  ?Pulse: (!) 55 (!) 55 74 65  ?Resp: 18 17    ?Temp:   98.2 ?F (36.8 ?C)   ?TempSrc:   Oral   ?SpO2: 96% 96% 97% 96%  ?Weight:      ?Height:      ? ? ?Intake/Output Summary (Last 24 hours) at 03/21/2022 1724 ?Last data filed at 03/21/2022 0600 ?Gross per 24 hour  ?Intake 459.97 ml  ?Output 725 ml  ?Net -265.03 ml  ? ? ?PULM  CTAB ?CV  RRR ?VASC  Pulsatile abdominal mass present. ? ?Laboratory ?CBC ?   ?Component Value Date/Time  ? WBC 8.4 03/21/2022 0715  ? HGB 10.4 (L) 03/21/2022 0715  ? HCT 31.8 (L) 03/21/2022 0715  ? PLT 362 03/21/2022 0715  ? ? ?BMET ?   ?Component Value Date/Time  ? NA 136 03/21/2022 0715  ? K 4.4 03/21/2022 0715  ? CL 105 03/21/2022 0715  ? CO2 26 03/21/2022 0715  ? GLUCOSE 98 03/21/2022 0715  ? BUN 24 (H) 03/21/2022 0715  ? CREATININE 0.67 03/21/2022 0715  ? CREATININE 0.92 10/23/2017 1552  ? CALCIUM 8.0 (L) 03/21/2022 0715  ? GFRNONAA >60 03/21/2022 0715  ? ? ?Assessment/Planning: ? ? ?AAA. >6 cm.  For endovascular repair in the am.  Risks and benefits discussed ? ? ?Logan Vazquez ? ?03/21/2022, 5:24 PM ? ? ? ?  ?

## 2022-03-21 NOTE — Evaluation (Signed)
Occupational Therapy Evaluation ?Patient Details ?Name: Logan Vazquez ?MRN: [id number] ?DOB: May 27, 1946 ?Today's Date: 03/21/2022 ? ? ?History of Present Illness Logan Vazquez is a 76 y.o. male with medical history significant of CAD, COPD, HTN was seen in ED for fall - pt fell off bed.  He had acute renal failure with rhabdomyolysis. Patient was also found to have aortic arterial dilation of 6.2 cm.  ? ?Clinical Impression ?  ?Logan Vazquez was seen for OT evaluation this date. Prior to hospital admission, pt was MOD I for mobility and ADLs using RW. Pt lives alone with sister stopping by ~3 days/week. Pt presents to acute OT demonstrating impaired ADL performance and functional mobility 2/2 decreased activity tolerance and functional strength/ROM/balance deficits. Upon arrival pt reclined in bed, unaware of sheets soiled.  ? ?Pt currently requires MIN A + RW for pericare in standing - assist for thororughness. CGA + RW for toilet t/f and hand washing standing sink side. SETUP + SUPERVISION don/doff gown and socks in sitting. Pt would benefit from skilled OT to address noted impairments and functional limitations (see below for any additional details). Upon hospital discharge, recommend STR to maximize pt safety and return to PLOF. ?  ? ?Recommendations for follow up therapy are one component of a multi-disciplinary discharge planning process, led by the attending physician.  Recommendations may be updated based on patient status, additional functional criteria and insurance authorization.  ? ?Follow Up Recommendations ? Skilled nursing-short term rehab (<3 hours/day)  ?  ?Assistance Recommended at Discharge Intermittent Supervision/Assistance  ?Patient can return home with the following A little help with walking and/or transfers;A little help with bathing/dressing/bathroom;Help with stairs or ramp for entrance ? ?  ?Functional Status Assessment ? Patient has had a recent decline in their functional status and  demonstrates the ability to make significant improvements in function in a reasonable and predictable amount of time.  ?Equipment Recommendations ? BSC/3in1  ?  ?Recommendations for Other Services   ? ? ?  ?Precautions / Restrictions Precautions ?Precautions: Fall ?Restrictions ?Weight Bearing Restrictions: No  ? ?  ? ?Mobility Bed Mobility ?Overal bed mobility: Needs Assistance ?Bed Mobility: Supine to Sit, Sit to Supine ?  ?  ?Supine to sit: Min assist ?  ?  ?  ?  ? ?Transfers ?Overall transfer level: Needs assistance ?Equipment used: Rolling walker (2 wheels) ?Transfers: Sit to/from Stand ?Sit to Stand: Min guard ?  ?  ?  ?  ?  ?  ?  ? ?  ?Balance Overall balance assessment: Needs assistance ?Sitting-balance support: Single extremity supported ?Sitting balance-Leahy Scale: Fair ?  ?  ?Standing balance support: No upper extremity supported, During functional activity ?Standing balance-Leahy Scale: Fair ?  ?  ?  ?  ?  ?  ?  ?  ?  ?  ?  ?  ?   ? ?ADL either performed or assessed with clinical judgement  ? ?ADL Overall ADL's : Needs assistance/impaired ?  ?  ?  ?  ?  ?  ?  ?  ?  ?  ?  ?  ?  ?  ?  ?  ?  ?  ?  ?General ADL Comments: MIN A + RW for pericare in standing - assist for thororughness. CGA + RW for toilet t/f and hand washing standing sink side. SETUP + SUPERVISION don/doff gown and socks in sitting.  ? ? ? ? ?Pertinent Vitals/Pain Pain Assessment ?Pain Assessment: No/denies pain  ? ? ? ?  Hand Dominance Left ?  ?Extremity/Trunk Assessment Upper Extremity Assessment ?Upper Extremity Assessment: Generalized weakness ?  ?Lower Extremity Assessment ?Lower Extremity Assessment: Generalized weakness ?  ?  ?  ?Communication Communication ?Communication: No difficulties ?  ?Cognition Arousal/Alertness: Awake/alert ?Behavior During Therapy: [facility] for tasks assessed/performed ?Overall Cognitive Status: Within Functional Limits for tasks assessed ?  ?  ?  ?  ?  ?  ?  ?  ?  ?  ?  ?  ?  ?  ?  ?  ?  ?  ?  ? ?Home Living  Family/patient expects to be discharged to:: Private residence ?Living Arrangements: Alone ?Available Help at Discharge:  (reports his sister stops ~3x/wk, sitter/PCA just recently started coming 1d/wk) ?  ?Home Access: Stairs to enter ?Entrance Stairs-Number of Steps: 2 ?  ?Home Layout: One level ?  ?  ?  ?  ?  ?  ?  ?Home Equipment: Agricultural consultant (2 wheels) ?  ?  ?  ? ?  ?Prior Functioning/Environment Prior Level of Function : Independent/Modified Independent ?  ?  ?  ?  ?  ?  ?Mobility Comments: reports always using RW however endorses falls hx ?  ?  ? ?  ?  ?OT Problem List: Decreased strength;Decreased activity tolerance;Impaired balance (sitting and/or standing);Decreased safety awareness ?  ?   ?OT Treatment/Interventions: Self-care/ADL training;Therapeutic exercise;Energy conservation;DME and/or AE instruction;Therapeutic activities;Patient/family education;Balance training  ?  ?OT Goals(Current goals can be found in the care plan section) Acute Rehab OT Goals ?Patient Stated Goal: to not fall ?OT Goal Formulation: With patient ?Time For Goal Achievement: 04/04/22 ?Potential to Achieve Goals: Good ?ADL Goals ?Pt Will Perform Grooming: Independently;standing ?Pt Will Perform Lower Body Dressing: Independently;sit to/from stand ?Pt Will Transfer to Toilet: ambulating;regular height toilet;with modified independence ?Pt Will Perform Toileting - Clothing Manipulation and hygiene: Independently;sit to/from stand  ?OT Frequency: Min 2X/week ?  ? ?Co-evaluation   ?  ?  ?  ?  ? ?  ?AM-PAC OT "6 Clicks" Daily Activity     ?Outcome Measure Help from another person eating meals?: None ?Help from another person taking care of personal grooming?: A Little ?Help from another person toileting, which includes using toliet, bedpan, or urinal?: A Little ?Help from another person bathing (including washing, rinsing, drying)?: A Little ?Help from another person to put on and taking off regular upper body clothing?: A  Little ?Help from another person to put on and taking off regular lower body clothing?: A Little ?6 Click Score: 19 ?  ?End of Session Equipment Utilized During Treatment: Rolling walker (2 wheels) ?Nurse Communication: Mobility status ? ?Activity Tolerance: Patient tolerated treatment well ?Patient left: in chair;with call bell/phone within reach ? ?OT Visit Diagnosis: Other abnormalities of gait and mobility (R26.89);History of falling (Z91.81)  ?              ?Time: 0076-2263 ?OT Time Calculation (min): 16 min ?Charges:  OT General Charges ?$OT Visit: 1 Visit ?OT Evaluation ?$OT Eval Low Complexity: 1 Low ?OT Treatments ?$Self Care/Home Management : 8-22 mins ? ?Kathie Dike, M.S. OTR/L  ?03/21/22, 1:44 PM  ?ascom [phone] ? ?

## 2022-03-21 NOTE — Assessment & Plan Note (Addendum)
EVAR performed 3/7.  Patient doing very well. ?

## 2022-03-21 NOTE — TOC Progression Note (Signed)
Transition of Care (TOC) - Progression Note  ? ? ?Patient Details  ?Name: Logan Vazquez ?MRN: [id number] ?Date of Birth: [birth year] ? ?Transition of Care (TOC) CM/SW Contact  ?Allayne Butcher, RN ?Phone Number: ?03/21/2022, 2:33 PM ? ?Clinical Narrative:    ? ?Patient is doing well sitting up in recliner eating lunch earlier.  Patient will go for EVAR tomorrow with Dr. Wyn Quaker.  Patient is floor status and plan is still for short term rehab.   ? ?Expected Discharge Plan: Skilled Nursing Facility ?Barriers to Discharge: Continued Medical Work up ? ?Expected Discharge Plan and Services ?Expected Discharge Plan: Skilled Nursing Facility ?  ?Discharge Planning Services: CM Consult ?  ?Living arrangements for the past 2 months: Single Family Home ?                ?  ?  ?  ?  ?  ?  ?  ?  ?  ?  ? ? ?Social Determinants of Health (SDOH) Interventions ?  ? ?Readmission Risk Interventions ?   ? View : No data to display.  ?  ?  ?  ? ? ?

## 2022-03-21 NOTE — Progress Notes (Signed)
?  Progress Note ? ? ?Patient: Logan Vazquez TGG:[id number] DOB: 05-28-1946 DOA: 03/13/2022     7 ?DOS: the patient was seen and examined on 03/21/2022 ?  ?Brief hospital course: ?Logan Vazquez is a 76 y.o. male with medical history significant of  CAD, copd, htn, seen in ed for fall pt fell off bed.  He had acute renal failure with rhabdomyolysis.  Received IV fluids.  Condition has resolved afterwards. ?Patient was also found to have aortic arterial dilation of 6.2 cm. ?Patient had stress nuclear test 4/5, deemed to be a lower risk.  Patient is scheduled for EAVR per Dr. Wyn Quaker ? ?Assessment and Plan: ?* Fall at home, initial encounter ?Continue PT/OT. ? ?Rhabdomyolysis ?Pt has cpk of  1690. ?Condition resolved. ? ? ?Essential hypertension ?Continue Norvasc. ? ? ?Syncope and collapse ?MRI of the brain did not show any acute changes.  Patient had some dehydration which has resolved after giving fluids. ?Patient no longer has any symptoms. ? ? ? ?Atherosclerotic heart disease of native coronary artery without angina pectoris ?Stable and no chest pain.  ? ?\ ? ?COPD (chronic obstructive pulmonary disease) (HCC) ?Stable without bronchospasm. ? ?Chronic hip pain, left ?Secondary to osteoarthritis.  Continue symptomatic treatment. ? ?GERD (gastroesophageal reflux disease) ?Cont ppi therapy.  ? ? ?Hypokalemia ?Potassium has normalized ? ?Pre-operative cardiovascular examination ?Patient has been eval by cardiology, nuclear stress test showed low cardiac risk. ? ?Coronary artery disease involving native coronary artery of native heart without angina pectoris ?Stable.  No angina. ? ?Anemia ?Hemoglobin stable. ? ? ? ?AKI (acute kidney injury) (HCC) ?Condition resolved. ? ? ?Abdominal aortic aneurysm (AAA) (HCC) ?Discussed with Dr. Belva Agee, EVAR scheduled for tomorrow. ? ? ? ? ?  ? ?Subjective:  ?Patient doing well today, denies any chest pain shortness of breath. ?No abdominal pain nausea vomiting. ? ?Physical Exam: ?Vitals:  ?  03/21/22 0600 03/21/22 0700 03/21/22 0800 03/21/22 0900  ?BP: (!) 95/59 117/64 (!) 114/58 115/65  ?Pulse: (!) 46 (!) 47 (!) 45 (!) 47  ?Resp: 12 14 12 13   ?Temp: 97.7 ?F (36.5 ?C)  98.1 ?F (36.7 ?C)   ?TempSrc: Oral  Oral   ?SpO2: 99% 97% 96% 97%  ?Weight:      ?Height:      ? ?General exam: Appears calm and comfortable  ?Respiratory system: Clear to auscultation. Respiratory effort normal. ?Cardiovascular system: S1 & S2 heard, RRR. No JVD, murmurs, rubs, gallops or clicks. No pedal edema. ?Gastrointestinal system: Abdomen is nondistended, soft and nontender. No organomegaly or masses felt. Normal bowel sounds heard. ?Central nervous system: Alert and oriented. No focal neurological deficits. ?Extremities: Symmetric 5 x 5 power. ?Skin: No rashes, lesions or ulcers ?Psychiatry: Judgement and insight appear normal. Mood & affect appropriate.  ? ?Data Reviewed: ? ?Results reviewed. ? ?Family Communication:  ? ?Disposition: ?Status is: Inpatient ?Remains inpatient appropriate because: Inpatient procedure ? Planned Discharge Destination: Skilled nursing facility ? ? ? ?Time spent: 23 minutes ? ?Author: ? , MD ?03/21/2022 1:23 PM ? ?For on call review [email or web address].  ?

## 2022-03-21 NOTE — Progress Notes (Signed)
Physical Therapy Treatment ?Patient Details ?Name: Logan Vazquez ?MRN: [id number] ?DOB: [birth year] ?Today's Date: 03/21/2022 ? ? ?History of Present Illness Logan Vazquez is a 76 y.o. male with medical history significant of CAD, COPD, HTN was seen in ED for fall - pt fell off bed.  He had acute renal failure with rhabdomyolysis. Patient was also found to have aortic arterial dilation of 6.2 cm. ? ?  ?PT Comments  ? ? Pt did very well with PT session this date.  He continues to have baseline poor posture and R LE strength but managed a relatively long and safe bout of ambulation.  Expected reliance on the walker but able to maintain consistent speed (despite inconsistent cadence) with heavy walker use.  Pt on room air with SpO2 remaining in the high 90s t/o and HR staying <100.    ?Recommendations for follow up therapy are one component of a multi-disciplinary discharge planning process, led by the attending physician.  Recommendations may be updated based on patient status, additional functional criteria and insurance authorization. ? ?Follow Up Recommendations ? Follow physician's recommendations for discharge plan and follow up therapies (will need re-assessed post sx) ?  ?  ?Assistance Recommended at Discharge Intermittent Supervision/Assistance  ?Patient can return home with the following A little help with walking and/or transfers;A little help with bathing/dressing/bathroom;Assistance with cooking/housework;Direct supervision/assist for medications management;Assist for transportation;Help with stairs or ramp for entrance ?  ?Equipment Recommendations ? None recommended by PT  ?  ?Recommendations for Other Services   ? ? ?  ?Precautions / Restrictions Precautions ?Precautions: Fall ?Restrictions ?Weight Bearing Restrictions: No  ?  ? ?Mobility ? Bed Mobility ?  ?  ?  ?  ?  ?  ?  ?General bed mobility comments: in recliner pre and post session ?  ? ?Transfers ?Overall transfer level: Needs assistance ?Equipment  used: Rolling walker (2 wheels) ?Transfers: Sit to/from Stand ?Sit to Stand: Min guard ?  ?  ?  ?  ?  ?General transfer comment: From elevated surface pt was able to rise w/o direct assist.  He maintained poor, forward stooped posture with minimal abiltiy to correct.  Needed UEs on walker to maintain balance ?  ? ?Ambulation/Gait ?Ambulation/Gait assistance: Min guard ?Gait Distance (Feet): 175 Feet ?Assistive device: Rolling walker (2 wheels) ?  ?  ?  ?  ?General Gait Details: Pt eager to see what he was able to do with ambulation and actually ended up surprising himself with how well he did.  He maintains stooped baseline posture and mild L foot drop but was otherwise able to maintain consistent cadence with O2 remaining in the high 90s on room air and HR staying <100 t/o the effort.  Pt with occasional minimal mis steps with no LOBs or overt safety issues. ? ? ?Stairs ?  ?  ?  ?  ?  ? ? ?Wheelchair Mobility ?  ? ?Modified Rankin (Stroke Patients Only) ?  ? ? ?  ?Balance Overall balance assessment: Needs assistance ?Sitting-balance support: Single extremity supported ?Sitting balance-Leahy Scale: Fair ?  ?  ?Standing balance support: No upper extremity supported, During functional activity ?Standing balance-Leahy Scale: Fair ?  ?  ?  ?  ?  ?  ?  ?  ?  ?  ?  ?  ?  ? ?  ?Cognition Arousal/Alertness: Awake/alert ?Behavior During Therapy: [facility] for tasks assessed/performed ?Overall Cognitive Status: Within Functional Limits for tasks assessed ?  ?  ?  ?  ?  ?  ?  ?  ?  ?  ?  ?  ?  ?  ?  ?  ?  ?  ?  ? ?  ?  Exercises   ? ?  ?General Comments   ?  ?  ? ?Pertinent Vitals/Pain Pain Assessment ?Pain Assessment: No/denies pain  ? ? ?Home Living Family/patient expects to be discharged to:: Private residence ?Living Arrangements: Alone ?Available Help at Discharge:  (reports his sister stops ~3x/wk, sitter/PCA just recently started coming 1d/wk) ?  ?Home Access: Stairs to enter ?  ?Entrance Stairs-Number of Steps: 2 ?  ?Home  Layout: One level ?Home Equipment: Agricultural consultant (2 wheels) ?   ?  ?Prior Function    ?  ?  ?   ? ?PT Goals (current goals can now be found in the care plan section) Progress towards PT goals: Progressing toward goals ? ?  ?Frequency ? ? ? Min 2X/week ? ? ? ?  ?PT Plan Current plan remains appropriate  ? ? ?Co-evaluation   ?  ?  ?  ?  ? ?  ?AM-PAC PT "6 Clicks" Mobility   ?Outcome Measure ? Help needed turning from your back to your side while in a flat bed without using bedrails?: A Little ?Help needed moving from lying on your back to sitting on the side of a flat bed without using bedrails?: A Little ?Help needed moving to and from a bed to a chair (including a wheelchair)?: A Little ?Help needed standing up from a chair using your arms (e.g., wheelchair or bedside chair)?: A Little ?Help needed to walk in hospital room?: A Little ?Help needed climbing 3-5 steps with a railing? : A Little ?6 Click Score: 18 ? ?  ?End of Session Equipment Utilized During Treatment: Gait belt ?Activity Tolerance: Patient limited by fatigue ?Patient left: with call bell/phone within reach;in chair;with family/visitor present ?Nurse Communication: Mobility status ?PT Visit Diagnosis: Muscle weakness (generalized) (M62.81);Difficulty in walking, not elsewhere classified (R26.2);Unsteadiness on feet (R26.81) ?  ? ? ?Time: 4166-0630 ?PT Time Calculation (min) (ACUTE ONLY): 28 min ? ?Charges:  $Gait Training: 8-22 mins ?$Therapeutic Activity: 8-22 mins          ?          ? ?Malachi Pro, DPT ?03/21/2022, 3:49 PM ? ?

## 2022-03-22 ENCOUNTER — Encounter
Admission: EM | Disposition: A | Discharge status: Home Health | Payer: Self-pay | Source: Home / Self Care | Attending: Internal Medicine

## 2022-03-22 ENCOUNTER — Inpatient Hospital Stay: Payer: Medicare Other | Admitting: Anesthesiology

## 2022-03-22 ENCOUNTER — Encounter: Payer: Self-pay | Admitting: Internal Medicine

## 2022-03-22 DIAGNOSIS — W19XXXA Unspecified fall, initial encounter: Secondary | ICD-10-CM | POA: Diagnosis not present

## 2022-03-22 DIAGNOSIS — R55 Syncope and collapse: Secondary | ICD-10-CM | POA: Diagnosis not present

## 2022-03-22 DIAGNOSIS — T796XXA Traumatic ischemia of muscle, initial encounter: Secondary | ICD-10-CM | POA: Diagnosis not present

## 2022-03-22 DIAGNOSIS — I7143 Infrarenal abdominal aortic aneurysm, without rupture: Secondary | ICD-10-CM | POA: Diagnosis not present

## 2022-03-22 HISTORY — PX: ENDOVASCULAR REPAIR/STENT GRAFT: CATH118280

## 2022-03-22 SURGERY — ENDOVASCULAR STENT GRAFT (AAA)
Anesthesia: General

## 2022-03-22 MED ORDER — PHENYLEPHRINE 40 MCG/ML (10ML) SYRINGE FOR IV PUSH (FOR BLOOD PRESSURE SUPPORT)
PREFILLED_SYRINGE | INTRAVENOUS | Status: DC | PRN
  Administered 2022-03-22: 80 ug via INTRAVENOUS
  Administered 2022-03-22: 160 ug via INTRAVENOUS

## 2022-03-22 MED ORDER — PHENYLEPHRINE HCL-NACL 20-0.9 MG/250ML-% IV SOLN
INTRAVENOUS | Status: DC | PRN
  Administered 2022-03-22: 30 ug/min via INTRAVENOUS

## 2022-03-22 MED ORDER — CEFAZOLIN SODIUM-DEXTROSE 2-4 GM/100ML-% IV SOLN
INTRAVENOUS | Status: AC
  Filled 2022-03-22: qty 100

## 2022-03-22 MED ORDER — HEPARIN SODIUM (PORCINE) 1000 UNIT/ML IJ SOLN
INTRAMUSCULAR | Status: DC | PRN
  Administered 2022-03-22: 5000 [IU] via INTRAVENOUS

## 2022-03-22 MED ORDER — OXYCODONE HCL 5 MG PO TABS
10.0000 mg | ORAL_TABLET | Freq: Four times a day (QID) | ORAL | Status: DC | PRN
  Administered 2022-03-22 – 2022-03-23 (×5): 10 mg via ORAL
  Filled 2022-03-22 (×5): qty 2

## 2022-03-22 MED ORDER — PROPOFOL 10 MG/ML IV BOLUS
INTRAVENOUS | Status: AC
  Filled 2022-03-22: qty 20

## 2022-03-22 MED ORDER — FENTANYL CITRATE (PF) 100 MCG/2ML IJ SOLN
INTRAMUSCULAR | Status: AC
Start: 2022-03-22 — End: ?
  Filled 2022-03-22: qty 2

## 2022-03-22 MED ORDER — CLOPIDOGREL BISULFATE 75 MG PO TABS
75.0000 mg | ORAL_TABLET | Freq: Every day | ORAL | Status: DC
Start: 2022-03-22 — End: 2022-03-26
  Administered 2022-03-22 – 2022-03-26 (×5): 75 mg via ORAL
  Filled 2022-03-22 (×5): qty 1

## 2022-03-22 MED ORDER — OXYCODONE-ACETAMINOPHEN 5-325 MG PO TABS
2.0000 | ORAL_TABLET | Freq: Four times a day (QID) | ORAL | Status: DC | PRN
  Administered 2022-03-23 – 2022-03-25 (×4): 2 via ORAL
  Filled 2022-03-22 (×5): qty 2

## 2022-03-22 MED ORDER — DEXAMETHASONE SODIUM PHOSPHATE 10 MG/ML IJ SOLN
INTRAMUSCULAR | Status: DC | PRN
  Administered 2022-03-22: 10 mg via INTRAVENOUS

## 2022-03-22 MED ORDER — EPHEDRINE SULFATE (PRESSORS) 50 MG/ML IJ SOLN
INTRAMUSCULAR | Status: DC | PRN
  Administered 2022-03-22: 10 mg via INTRAVENOUS
  Administered 2022-03-22: 5 mg via INTRAVENOUS

## 2022-03-22 MED ORDER — LIDOCAINE HCL (CARDIAC) PF 100 MG/5ML IV SOSY
PREFILLED_SYRINGE | INTRAVENOUS | Status: DC | PRN
  Administered 2022-03-22: 50 mg via INTRAVENOUS

## 2022-03-22 MED ORDER — SODIUM CHLORIDE 0.9 % IV SOLN: INTRAVENOUS | Status: DC | PRN

## 2022-03-22 MED ORDER — ROCURONIUM BROMIDE 100 MG/10ML IV SOLN
INTRAVENOUS | Status: DC | PRN
  Administered 2022-03-22: 40 mg via INTRAVENOUS
  Administered 2022-03-22: 20 mg via INTRAVENOUS

## 2022-03-22 MED ORDER — SUGAMMADEX SODIUM 200 MG/2ML IV SOLN
INTRAVENOUS | Status: DC | PRN
  Administered 2022-03-22: 200 mg via INTRAVENOUS

## 2022-03-22 MED ORDER — ONDANSETRON HCL 4 MG/2ML IJ SOLN
4.0000 mg | Freq: Four times a day (QID) | INTRAMUSCULAR | Status: DC | PRN
  Administered 2022-03-22: 4 mg via INTRAVENOUS
  Filled 2022-03-22: qty 2

## 2022-03-22 MED ORDER — PROPOFOL 10 MG/ML IV BOLUS
INTRAVENOUS | Status: DC | PRN
  Administered 2022-03-22: 75 mg via INTRAVENOUS

## 2022-03-22 MED ORDER — ONDANSETRON HCL 4 MG/2ML IJ SOLN
INTRAMUSCULAR | Status: DC | PRN
  Administered 2022-03-22: 4 mg via INTRAVENOUS

## 2022-03-22 MED ORDER — FENTANYL CITRATE (PF) 100 MCG/2ML IJ SOLN
INTRAMUSCULAR | Status: DC | PRN
Start: 2022-03-22 — End: 2022-03-22
  Administered 2022-03-22 (×2): 50 ug via INTRAVENOUS

## 2022-03-22 SURGICAL SUPPLY — 26 items
CATH ACCU-VU SIZ PIG 5F 70CM (CATHETERS) ×1 IMPLANT
CATH BALLN CODA 9X100X32 (BALLOONS) ×1 IMPLANT
CATH BEACON 5 .035 65 KMP TIP (CATHETERS) ×1 IMPLANT
CLOSURE PERCLOSE PROSTYLE (VASCULAR PRODUCTS) ×4 IMPLANT
COVER DRAPE FLUORO 36X44 (DRAPES) ×2 IMPLANT
COVER PROBE U/S 5X48 (MISCELLANEOUS) ×1 IMPLANT
DEVICE SAFEGUARD 24CM (GAUZE/BANDAGES/DRESSINGS) ×2 IMPLANT
DEVICE TORQUE (MISCELLANEOUS) ×1 IMPLANT
DRYSEAL FLEXSHEATH 12FR 33CM (SHEATH) ×1
DRYSEAL FLEXSHEATH 18FR 33CM (SHEATH) ×1
EXCLDR TRNK ENDO 32X14.5X14 18 (Endovascular Graft) ×2 IMPLANT
EXCLUDER TNK END 32X14.5X14 18 (Endovascular Graft) IMPLANT
GLIDEWIRE ANGLED SS 035X260CM (WIRE) ×1 IMPLANT
LEG CONTRALATERAL 16X18X9.5 (Endovascular Graft) ×1 IMPLANT
LEG CONTRALATERAL 20X11.5 (Vascular Products) ×2 IMPLANT
PACK ANGIOGRAPHY (CUSTOM PROCEDURE TRAY) ×3 IMPLANT
SHEATH BRITE TIP 6FRX11 (SHEATH) ×2 IMPLANT
SHEATH BRITE TIP 8FRX11 (SHEATH) ×4 IMPLANT
SHEATH DRYSEAL FLEX 12FR 33CM (SHEATH) IMPLANT
SHEATH DRYSEAL FLEX 18FR 33CM (SHEATH) IMPLANT
SPONGE XRAY 4X4 16PLY STRL (MISCELLANEOUS) ×3 IMPLANT
STENT GRAFT CONTRALAT 20X11.5 (Vascular Products) IMPLANT
SYR MEDRAD MARK 7 150ML (SYRINGE) ×1 IMPLANT
TUBING CONTRAST HIGH PRESS 72 (TUBING) ×1 IMPLANT
WIRE AMPLATZ SSTIFF .035X260CM (WIRE) ×2 IMPLANT
WIRE GUIDERIGHT .035X150 (WIRE) ×2 IMPLANT

## 2022-03-22 NOTE — Op Note (Signed)
? ? ?OPERATIVE NOTE ? ? ?PROCEDURE: ?US guidance for vascular access, bilateral femoral arteries ?Catheter placement into aorta from bilateral femoral approaches ?Placement of a 32 mm diameter conformable Gore Excluder Endoprosthesis main body left with a 20 mm diameter by 12 cm length right contralateral limb ?Placement of an 18 mm diameter by 10 cm length left iliac extension limb ?ProGlide closure devices bilateral femoral arteries ? ?PRE-OPERATIVE DIAGNOSIS: AAA ? ?POST-OPERATIVE DIAGNOSIS: same ? ?SURGEON: Festus Barren, MD  ? ?ANESTHESIA: General ? ?ESTIMATED BLOOD LOSS: 50 cc ? ?FINDING(S): ?1.  AAA ? ?SPECIMEN(S):  none ? ?INDICATIONS:   ?Logan Vazquez is a 76 y.o. male who presents with a greater than 6 cm abdominal aortic aneurysm. The anatomy was suitable for endovascular repair.  Risks and benefits of repair in an endovascular fashion were discussed and informed consent was obtained.  ? ?DESCRIPTION: ?After obtaining full informed written consent, the patient was brought back to the operating room and placed supine upon the operating table.  The patient received IV antibiotics prior to induction.  After obtaining adequate anesthesia, the patient was prepped and draped in the standard fashion for endovascular AAA repair.  We then began by gaining access to both femoral arteries with US guidance.  The femoral arteries were found to be patent and accessed without difficulty with a needle under ultrasound guidance without difficulty on each side and permanent images were recorded.  We then placed 2 proglide devices on each side in a pre-close fashion and placed 8 French sheaths. The patient was then given 5000 units of intravenous heparin. The Pigtail catheter was placed into the aorta from the right side. Using this image, we selected a 32 mm diameter 14 cm length conformable Gore Excluder Main body device.  Over a stiff wire, an 45 French sheath was placed up the left side. The main body was then placed  through the 18 French sheath. A Kumpe catheter was placed up the right side and a magnified image at the renal arteries was performed. The main body was then deployed just below the lowest renal artery which was the right. The Kumpe catheter was used to cannulate the contralateral gate without difficulty and successful cannulation was confirmed by twirling the pigtail catheter in the main body. We then placed a stiff wire and a retrograde arteriogram was performed through the right femoral sheath. We upsized to the 12 [place] sheath on the right for the contralateral limb and a 20 mm diameter by 12 cm length right iliac limb was selected and deployed. The main body deployment was then completed. Based off the angiographic findings, extension limbs were necessary.  An 18 mm diameter by 10 cm length left iliac extension limb was taken down to just above the hypogastric artery. All junction points and seals zones were treated with the compliant balloon. The pigtail catheter was then replaced and a completion angiogram was performed.  No endoleak was detected on completion angiography. The renal arteries were found to be widely patent.  Both hypogastric arteries remain patent. At this point we elected to terminate the procedure. We secured the pro glide devices for hemostasis on the femoral arteries. The skin incision was closed with a 4-0 Monocryl. Dermabond and pressure dressing were placed. The patient was taken to the recovery room in stable condition having tolerated the procedure well. ? ?COMPLICATIONS: none ? ?CONDITION: stable ? ?Festus Barren ? ?03/22/2022, 9:19 AM ? ? ?This note was created with Dragon Medical transcription system. Any errors  in dictation are purely unintentional.  ?

## 2022-03-22 NOTE — Transfer of Care (Signed)
Immediate Anesthesia Transfer of Care Note ? ?Patient: Logan Vazquez ? ?Procedure(s) Performed: ENDOVASCULAR REPAIR/STENT GRAFT ? ?Patient Location: PACU and ICU ? ?Anesthesia Type:General ? ?Level of Consciousness: awake, alert  and oriented ? ?Airway & Oxygen Therapy: Patient Spontanous Breathing and Patient connected to face mask oxygen ? ?Post-op Assessment: Report given to RN and Post -op Vital signs reviewed and stable ? ?Post vital signs: Reviewed and stable ? ?Last Vitals:  ?Vitals Value Taken Time  ?BP 131/67   ?Temp 35.4   ?Pulse 71   ?Resp 18   ?SpO2 100   ? ? ?Last Pain:  ?Vitals:  ? 03/22/22 0711  ?TempSrc: Oral  ?PainSc: 0-No pain  ?   ? ?  ? ?Complications: No notable events documented. ?

## 2022-03-22 NOTE — Plan of Care (Signed)
  Problem: Clinical Measurements: Goal: Ability to maintain clinical measurements within normal limits will improve Outcome: Progressing   Problem: Clinical Measurements: Goal: Cardiovascular complication will be avoided Outcome: Progressing   

## 2022-03-22 NOTE — Progress Notes (Signed)
?  Progress Note ? ? ?Patient: Logan Vazquez QIH:[id number] DOB: [birth year] DOA: 03/13/2022     8 ?DOS: the patient was seen and examined on 03/22/2022 ?  ?Brief hospital course: ?Logan Vazquez is a 76 y.o. male with medical history significant of  CAD, copd, htn, seen in ed for fall pt fell off bed.  He had acute renal failure with rhabdomyolysis.  Received IV fluids.  Condition has resolved afterwards. ?Patient was also found to have aortic arterial dilation of 6.2 cm. ?Patient had stress nuclear test 4/5, deemed to be a lower risk.   EAVR performed 4/7.  ?Patient has been seen by PT/OT, recommended nursing placement.  Pending placement. ? ?Assessment and Plan: ?* Fall at home, initial encounter ?Patient will need nursing placement for rehab ? ?Rhabdomyolysis ?Condition has resolved ? ? ?Essential hypertension ?No change in treatment plan. ? ? ?Syncope and collapse ?Condition has resolved. ? ? ?COPD (chronic obstructive pulmonary disease) (HCC) ?Condition stable ? ?Chronic hip pain, left ?Secondary to osteoarthritis.  Continue symptomatic treatment. ? ?GERD (gastroesophageal reflux disease) ?Cont ppi therapy.  ? ? ?Hypokalemia ?Potassium has normalized ? ?Pre-operative cardiovascular examination ?Patient has been eval by cardiology, nuclear stress test showed low cardiac risk.  Patient did well with surgery. ? ?Coronary artery disease involving native coronary artery of native heart without angina pectoris ?Condition stable ? ?Anemia ?Hemoglobin stable. ? ? ? ?AKI (acute kidney injury) (HCC) ?Condition resolved.  Recheck a BMP tomorrow. ? ? ?Abdominal aortic aneurysm (AAA) (HCC) ? EVAR performed today, patient doing well postoperatively. ? ? ? ? ?  ? ?Subjective:  ?Patient doing well postoperatively. ?Currently has no complaints. ? ?Physical Exam: ?Vitals:  ? 03/22/22 0711 03/22/22 0715 03/22/22 0730 03/22/22 0945  ?BP: 115/67   131/68  ?Pulse: 62 63 (!) 59 71  ?Resp: 15 16 16 16   ?Temp: 98.1 ?F (36.7 ?C)     ?TempSrc:  Oral     ?SpO2: (!) 17%   100%  ?Weight:      ?Height:      ? ?General exam: Appears calm and comfortable  ?Respiratory system: Clear to auscultation. Respiratory effort normal. ?Cardiovascular system: S1 & S2 heard, RRR. No JVD, murmurs, rubs, gallops or clicks. No pedal edema. ?Gastrointestinal system: Abdomen is nondistended, soft and nontender. No organomegaly or masses felt. Normal bowel sounds heard. ?Central nervous system: Alert and oriented. No focal neurological deficits. ?Extremities: Symmetric 5 x 5 power. ?Skin: No rashes, lesions or ulcers ?Psychiatry: Judgement and insight appear normal. Mood & affect appropriate.  ? ?Data Reviewed: ? ?Reviewed the labs ? ?Family Communication:  ? ?Disposition: ?Status is: Inpatient ?Remains inpatient appropriate because: Severity of disease, had surgery today. ? Planned Discharge Destination: Skilled nursing facility ? ? ? ?Time spent: 23 minutes ? ?Author: ? , MD ?03/22/2022 12:38 PM ? ?For on call review [email or web address].  ?

## 2022-03-22 NOTE — Anesthesia Procedure Notes (Signed)
Procedure Name: Intubation ?Date/Time: 03/22/2022 8:01 AM ?Performed by: Lily Peer, Matsuko Kretz, CRNA ?Pre-anesthesia Checklist: Patient identified, Emergency Drugs available, Suction available and Patient being monitored ?Patient Re-evaluated:Patient Re-evaluated prior to induction ?Oxygen Delivery Method: Circle system utilized ?Preoxygenation: Pre-oxygenation with 100% oxygen ?Induction Type: IV induction ?Ventilation: Mask ventilation without difficulty ?Laryngoscope Size: McGraph and 4 ?Grade View: Grade I ?Tube type: Oral ?Tube size: 7.5 mm ?Number of attempts: 1 ?Airway Equipment and Method: Stylet ?Placement Confirmation: ETT inserted through vocal cords under direct vision, positive ETCO2 and breath sounds checked- equal and bilateral ?Secured at: 21 cm ?Tube secured with: Tape ?Dental Injury: Teeth and Oropharynx as per pre-operative assessment  ? ? ? ? ?
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
## 2022-03-23 ENCOUNTER — Encounter: Payer: Self-pay | Admitting: Vascular Surgery

## 2022-03-23 DIAGNOSIS — I2510 Atherosclerotic heart disease of native coronary artery without angina pectoris: Secondary | ICD-10-CM

## 2022-03-23 DIAGNOSIS — I7143 Infrarenal abdominal aortic aneurysm, without rupture: Secondary | ICD-10-CM | POA: Diagnosis not present

## 2022-03-23 DIAGNOSIS — W19XXXA Unspecified fall, initial encounter: Secondary | ICD-10-CM

## 2022-03-23 DIAGNOSIS — Y92009 Unspecified place in unspecified non-institutional (private) residence as the place of occurrence of the external cause: Secondary | ICD-10-CM | POA: Diagnosis not present

## 2022-03-23 DIAGNOSIS — T796XXA Traumatic ischemia of muscle, initial encounter: Secondary | ICD-10-CM | POA: Diagnosis not present

## 2022-03-23 LAB — BASIC METABOLIC PANEL
Anion gap: 6 (ref 5–15)
BUN: 29 mg/dL — ABNORMAL HIGH (ref 8–23)
CO2: 27 mmol/L (ref 22–32)
Calcium: 8.2 mg/dL — ABNORMAL LOW (ref 8.9–10.3)
Chloride: 101 mmol/L (ref 98–111)
Creatinine, Ser: 0.84 mg/dL (ref 0.61–1.24)
GFR, Estimated: >60 mL/min (ref >60)
Glucose, Bld: 127 mg/dL — ABNORMAL HIGH (ref 70–99)
Potassium: 4.5 mmol/L (ref 3.5–5.1)
Sodium: 134 mmol/L — ABNORMAL LOW (ref 135–145)

## 2022-03-23 LAB — CBC WITH DIFFERENTIAL/PLATELET
Abs Immature Granulocytes: 0.1 10*3/uL — ABNORMAL HIGH (ref 0.00–0.07)
Basophils Absolute: 0.1 10*3/uL (ref 0.0–0.1)
Basophils Relative: 0 %
Eosinophils Absolute: 0 10*3/uL (ref 0.0–0.5)
Eosinophils Relative: 0 %
HCT: 30.4 % — ABNORMAL LOW (ref 39.0–52.0)
Hemoglobin: 9.8 g/dL — ABNORMAL LOW (ref 13.0–17.0)
Immature Granulocytes: 1 %
Lymphocytes Relative: 13 %
Lymphs Abs: 2.2 10*3/uL (ref 0.7–4.0)
MCH: 29.9 pg (ref 26.0–34.0)
MCHC: 32.2 g/dL (ref 30.0–36.0)
MCV: 92.7 fL (ref 80.0–100.0)
Monocytes Absolute: 1.3 10*3/uL — ABNORMAL HIGH (ref 0.1–1.0)
Monocytes Relative: 8 %
Neutro Abs: 14.2 10*3/uL — ABNORMAL HIGH (ref 1.7–7.7)
Neutrophils Relative %: 78 %
Platelets: 398 10*3/uL (ref 150–400)
RBC: 3.28 MIL/uL — ABNORMAL LOW (ref 4.22–5.81)
RDW: 15.5 % (ref 11.5–15.5)
WBC: 18 10*3/uL — ABNORMAL HIGH (ref 4.0–10.5)
nRBC: 0 % (ref 0.0–0.2)

## 2022-03-23 MED ORDER — TRAZODONE HCL 50 MG PO TABS
50.0000 mg | ORAL_TABLET | Freq: Every day | ORAL | Status: DC
  Administered 2022-03-23: 50 mg via ORAL
  Filled 2022-03-23: qty 1

## 2022-03-23 NOTE — Progress Notes (Signed)
?  Progress Note ? ? ?Patient: Logan Vazquez UYQ:[id number] DOB: 09-11-1946 DOA: 03/13/2022     9 ?DOS: the patient was seen and examined on 03/23/2022 ?  ?Brief hospital course: ?JT BRABEC is a 76 y.o. male with medical history significant of  CAD, copd, htn, seen in ed for fall pt fell off bed.  He had acute renal failure with rhabdomyolysis.  Received IV fluids.  Condition has resolved afterwards. ?Patient was also found to have aortic arterial dilation of 6.2 cm. ?Patient had stress nuclear test 4/5, deemed to be a lower risk.   EAVR performed 4/7.  ?Patient has been seen by PT/OT, recommended nursing placement.  Pending placement. ? ?Assessment and Plan: ?* Fall at home, initial encounter ?Continue PT/OT, pending nursing home placement. ? ?Rhabdomyolysis ?From the fall, resolved. ? ? ?Essential hypertension ?Continue current treatment. ? ? ?Syncope and collapse ?Improved.  Not additional occurrence ? ?COPD (chronic obstructive pulmonary disease) (HCC) ?No bronchospasm. ? ?Chronic hip pain, left ?Secondary to osteoarthritis.  Continue symptomatic treatment. ? ?GERD (gastroesophageal reflux disease) ?Cont ppi therapy.  ? ? ?Hypokalemia ?Resolved. ? ?Pre-operative cardiovascular examination ?Patient has been eval by cardiology, nuclear stress test showed low cardiac risk.  Patient did well with surgery. ? ?Coronary artery disease involving native coronary artery of native heart without angina pectoris ?Patient has no chest pain. ? ?Anemia ?Condition stable. ? ? ? ?AKI (acute kidney injury) (HCC) ?Renal function still normal. ? ? ?Abdominal aortic aneurysm (AAA) (HCC) ? EVAR performed 3/7.  Doing well.  Has some leukocytosis, probably from surgery.  Recheck CBC tomorrow.  Pending nursing placement. ? ? ? ? ?  ? ?Subjective:  ?Patient doing well today, appetite is seem to be better today.  No nausea vomiting. ?No pain in the legs. ? ?Physical Exam: ?Vitals:  ? 03/23/22 0900 03/23/22 1000 03/23/22 1100 03/23/22 1200   ?BP: 110/75 (!) 106/58 108/60 112/62  ?Pulse: 69 (!) 56 (!) 57 (!) 57  ?Resp: 15 15 15 14   ?Temp:      ?TempSrc:      ?SpO2: 95% 96% 97% 96%  ?Weight:      ?Height:      ? ?General exam: Appears calm and comfortable  ?Respiratory system: Clear to auscultation. Respiratory effort normal. ?Cardiovascular system: S1 & S2 heard, RRR. No JVD, murmurs, rubs, gallops or clicks. No pedal edema. ?Gastrointestinal system: Abdomen is nondistended, soft and nontender. No organomegaly or masses felt. Normal bowel sounds heard. ?Central nervous system: Alert and oriented. No focal neurological deficits. ?Extremities: Symmetric 5 x 5 power. ?Skin: No rashes, lesions or ulcers ?Psychiatry: Judgement and insight appear normal. Mood & affect appropriate.  ? ?Data Reviewed: ? ?Reviewed labs ? ?Family Communication: called, not able to reach sister ? ?Disposition: ?Status is: Inpatient ?Remains inpatient appropriate because: Unsafe discharge, pending nursing placement. ? Planned Discharge Destination: Skilled nursing facility ? ? ? ?Time spent: 28 minutes ? ?Author: ? , MD ?03/23/2022 12:42 PM ? ?For on call review [email or web address].  ?

## 2022-03-23 NOTE — Progress Notes (Signed)
? ? ?  Pt has done well following vascular procedure ?Cardiology will sign off ?Call for questions ? ?Will follow up with Dr. Kirke Corin in the office ? ? ? ?Kristeen Miss, MD  ?03/23/2022 10:17 AM    ?[facility] ?[address] [place],  [address] ?[place], [place]  [address] ?Phone: [phone]; Fax: [phone]  ? ?

## 2022-03-23 NOTE — Plan of Care (Signed)
  Problem: Nutrition: Goal: Adequate nutrition will be maintained Outcome: Progressing   Problem: Coping: Goal: Level of anxiety will decrease Outcome: Progressing   

## 2022-03-23 NOTE — Progress Notes (Signed)
Arrived to floor from ICU.  Oriented patient to room and callbell.  Bed alarm in use. ?

## 2022-03-23 NOTE — Progress Notes (Signed)
Subjective: ?Interval History: none..  ? ?Objective: ?Vital signs in last 24 hours: ?Temp:  [97.9 ?F (36.6 ?C)-98.3 ?F (36.8 ?C)] 98.2 ?F (36.8 ?C) (04/08 0400) ?Pulse Rate:  [55-82] 57 (04/08 0800) ?Resp:  [10-18] 11 (04/08 0800) ?BP: (97-136)/(54-71) 99/57 (04/08 0800) ?SpO2:  [94 %-100 %] 95 % (04/08 0800) ? ?Intake/Output from previous day: ?04/07 0701 - 04/08 0700 ?In: [id number] [P.O.:720; I.V.:500] ?Out: 1700 [Urine:1650; Blood:50] ?Intake/Output this shift: ?No intake/output data recorded. ? ?General appearance: alert, cooperative, and no distress ?Extremities: extremities normal, atraumatic, no cyanosis or edema and groin sites clean dry and intact, safeguards removed.  No evidence of complication. ?Skin: Skin color, texture, turgor normal. No rashes or lesions ? ?Lab Results: ?Recent Labs  ?  03/21/22 ?0715 03/23/22 ?[id number]  ?WBC 8.4 18.0*  ?HGB 10.4* 9.8*  ?HCT 31.8* 30.4*  ?PLT 362 398  ? ?BMET ?Recent Labs  ?  03/21/22 ?0715 03/23/22 ?0816  ?NA 136 134*  ?K 4.4 4.5  ?CL 105 101  ?CO2 26 27  ?GLUCOSE 98 127*  ?BUN 24* 29*  ?CREATININE 0.67 0.84  ?CALCIUM 8.0* 8.2*  ? ? ?Studies/Results: ?CT ABDOMEN PELVIS WO CONTRAST ? ?Result Date: 03/14/2022 ?CLINICAL DATA:  Abdominal pain, acute, nonlocalized LLQ abd pain. EXAM: CT ABDOMEN AND PELVIS WITHOUT CONTRAST TECHNIQUE: Multidetector CT imaging of the abdomen and pelvis was performed following the standard protocol without IV contrast. RADIATION DOSE REDUCTION: This exam was performed according to the departmental dose-optimization program which includes automated exposure control, adjustment of the mA and/or kV according to patient size and/or use of iterative reconstruction technique. COMPARISON:  CT 02/18/2019, thoracic spine radiograph 02/07/2021. FINDINGS: Lower chest: There is peripheral predominant fibrotic change in the lung bases, similar to prior exam in March 2020. Coronary artery calcifications. No acute findings. Hepatobiliary: No focal liver abnormality  is seen. Prior cholecystectomy. Pancreas: Mild pancreatic atrophy.  No ductal dilation. Spleen: Normal in size without focal abnormality. Adrenals/Urinary Tract: Adrenal glands are unremarkable. There is mild renal pelvic fullness, right greater than left likely related to marked bladder distension. There is a large left posterior renal cyst measuring simple density. The bladder is markedly distended. Stomach/Bowel: The stomach is within normal limits. There is no evidence of bowel obstruction.The appendix is normal. There is a significant stool burden in the colon. Vascular/Lymphatic: Extensive aortoiliac atherosclerosis. An infrarenal abdominal aortic aneurysm measuring up to 6.0 by 5.8 cm (series 2, image 38). There is also aneurysmal dilation superior to this level measuring 3.7 x 3.4 cm (series 2, image 29). No lymphadenopathy. Reproductive: Enlarged prostate gland. Other: No bowel containing hernia.  No ascites.  No free air. Musculoskeletal: Prior lumbosacral inter spinous fusion. Multilevel degenerative changes of the spine. There is a compression fracture of T10 with severe anterior height loss, approximately 70%, as seen on prior thoracic spine radiograph from February 2022, though at that time there is measured 40% height loss. Unchanged mild anterior wedging of T12. Posttraumatic changes involving the left proximal femur, as seen on recent CT. There is mild to moderate bilateral hip osteoarthritis. IMPRESSION: Infrarenal abdominal aortic aneurysm measuring up to 6.0 cm. Recommend vascular surgery consultation. Marked bladder distension with associated mild fullness of the renal pelvises bilaterally. Enlarged prostate gland. Significant stool burden in the colon suggesting constipation. Probable acute on chronic T10 compression fracture, with now approximately 70% height loss, previously 60%. There is potentially an acute component mild, 3 mm bony retropulsion. Basilar fibrotic lung disease, similar to  prior CT in March 2020.  Electronically Signed   By: Maurine Simmering M.D.   On: 03/14/2022 16:30  ? ?CT HEAD WO CONTRAST (5MM) ? ?Result Date: 03/13/2022 ?CLINICAL DATA:  Fall.  Found down. EXAM: CT HEAD WITHOUT CONTRAST CT CERVICAL SPINE WITHOUT CONTRAST TECHNIQUE: Multidetector CT imaging of the head and cervical spine was performed following the standard protocol without intravenous contrast. Multiplanar CT image reconstructions of the cervical spine were also generated. RADIATION DOSE REDUCTION: This exam was performed according to the departmental dose-optimization program which includes automated exposure control, adjustment of the mA and/or kV according to patient size and/or use of iterative reconstruction technique. COMPARISON:  None. FINDINGS: CT HEAD FINDINGS Brain: Mild atrophy. Negative for hydrocephalus. Moderate patchy white matter hypodensity bilaterally. Negative for acute infarct, hemorrhage, mass Vascular: Atherosclerotic calcification distal left vertebral artery and carotid artery bilaterally. Negative for hyperdense vessel Skull: Negative Sinuses/Orbits: Paranasal sinuses clear.  Normal orbit Other: None CT CERVICAL SPINE FINDINGS Alignment: Normal Skull base and vertebrae: Negative for acute fracture. Chronic compression fracture C7 unchanged from prior studies. Soft tissues and spinal canal: Negative for soft tissue mass or adenopathy. Atherosclerotic calcification in the carotid artery bilaterally. Disc levels: Multilevel disc and facet degeneration. Mild spinal in foraminal stenosis C5-6 and C6-7. Upper chest: Lung apices clear bilaterally Other: None IMPRESSION: 1. No acute intracranial abnormality. Atrophy and moderate chronic microvascular ischemic change 2. Chronic compression fracture of C7.  No acute cervical fracture. Electronically Signed   By: Franchot Gallo M.D.   On: 03/13/2022 15:50  ? ?CT Cervical Spine Wo Contrast ? ?Result Date: 03/13/2022 ?CLINICAL DATA:  Fall.  Found down. EXAM:  CT HEAD WITHOUT CONTRAST CT CERVICAL SPINE WITHOUT CONTRAST TECHNIQUE: Multidetector CT imaging of the head and cervical spine was performed following the standard protocol without intravenous contrast. Multiplanar CT image reconstructions of the cervical spine were also generated. RADIATION DOSE REDUCTION: This exam was performed according to the departmental dose-optimization program which includes automated exposure control, adjustment of the mA and/or kV according to patient size and/or use of iterative reconstruction technique. COMPARISON:  None. FINDINGS: CT HEAD FINDINGS Brain: Mild atrophy. Negative for hydrocephalus. Moderate patchy white matter hypodensity bilaterally. Negative for acute infarct, hemorrhage, mass Vascular: Atherosclerotic calcification distal left vertebral artery and carotid artery bilaterally. Negative for hyperdense vessel Skull: Negative Sinuses/Orbits: Paranasal sinuses clear.  Normal orbit Other: None CT CERVICAL SPINE FINDINGS Alignment: Normal Skull base and vertebrae: Negative for acute fracture. Chronic compression fracture C7 unchanged from prior studies. Soft tissues and spinal canal: Negative for soft tissue mass or adenopathy. Atherosclerotic calcification in the carotid artery bilaterally. Disc levels: Multilevel disc and facet degeneration. Mild spinal in foraminal stenosis C5-6 and C6-7. Upper chest: Lung apices clear bilaterally Other: None IMPRESSION: 1. No acute intracranial abnormality. Atrophy and moderate chronic microvascular ischemic change 2. Chronic compression fracture of C7.  No acute cervical fracture. Electronically Signed   By: Franchot Gallo M.D.   On: 03/13/2022 15:50  ? ?MR BRAIN WO CONTRAST ? ?Result Date: 03/14/2022 ?CLINICAL DATA:  76 year old male fell out of bed. Found down. Altered mental status. EXAM: MRI HEAD WITHOUT CONTRAST TECHNIQUE: Multiplanar, multiecho pulse sequences of the brain and surrounding structures were obtained without intravenous  contrast. COMPARISON:  CT head and cervical spine yesterday. FINDINGS: Brain: No restricted diffusion or evidence of acute infarction. Chronic subcortical infarct with white matter encephalomalacia in the

## 2022-03-23 NOTE — Progress Notes (Signed)
Cross Cover ?Trazodone ordered and seroquel discontinued secondary to patient requesting different sleep med as seroquel has been ineffective ?

## 2022-03-24 DIAGNOSIS — T796XXA Traumatic ischemia of muscle, initial encounter: Secondary | ICD-10-CM | POA: Diagnosis not present

## 2022-03-24 DIAGNOSIS — G4700 Insomnia, unspecified: Secondary | ICD-10-CM

## 2022-03-24 DIAGNOSIS — F5101 Primary insomnia: Secondary | ICD-10-CM

## 2022-03-24 DIAGNOSIS — I7143 Infrarenal abdominal aortic aneurysm, without rupture: Secondary | ICD-10-CM | POA: Diagnosis not present

## 2022-03-24 LAB — CBC WITH DIFFERENTIAL/PLATELET
Abs Immature Granulocytes: 0.06 10*3/uL (ref 0.00–0.07)
Basophils Absolute: 0.1 10*3/uL (ref 0.0–0.1)
Basophils Relative: 1 %
Eosinophils Absolute: 0.2 10*3/uL (ref 0.0–0.5)
Eosinophils Relative: 2 %
HCT: 29.8 % — ABNORMAL LOW (ref 39.0–52.0)
Hemoglobin: 9.5 g/dL — ABNORMAL LOW (ref 13.0–17.0)
Immature Granulocytes: 1 %
Lymphocytes Relative: 26 %
Lymphs Abs: 3.3 10*3/uL (ref 0.7–4.0)
MCH: 29.4 pg (ref 26.0–34.0)
MCHC: 31.9 g/dL (ref 30.0–36.0)
MCV: 92.3 fL (ref 80.0–100.0)
Monocytes Absolute: 1.2 10*3/uL — ABNORMAL HIGH (ref 0.1–1.0)
Monocytes Relative: 9 %
Neutro Abs: 7.8 10*3/uL — ABNORMAL HIGH (ref 1.7–7.7)
Neutrophils Relative %: 61 %
Platelets: 397 10*3/uL (ref 150–400)
RBC: 3.23 MIL/uL — ABNORMAL LOW (ref 4.22–5.81)
RDW: 15.6 % — ABNORMAL HIGH (ref 11.5–15.5)
WBC: 12.6 10*3/uL — ABNORMAL HIGH (ref 4.0–10.5)
nRBC: 0 % (ref 0.0–0.2)

## 2022-03-24 MED ORDER — ACETAMINOPHEN 325 MG PO TABS
650.0000 mg | ORAL_TABLET | Freq: Four times a day (QID) | ORAL | Status: DC | PRN
  Administered 2022-03-24 – 2022-03-25 (×2): 650 mg via ORAL
  Filled 2022-03-24 (×2): qty 2

## 2022-03-24 MED ORDER — QUETIAPINE FUMARATE 25 MG PO TABS
25.0000 mg | ORAL_TABLET | Freq: Every day | ORAL | Status: DC
Start: 2022-03-24 — End: 2022-03-25
  Administered 2022-03-24: 25 mg via ORAL
  Filled 2022-03-24: qty 1

## 2022-03-24 NOTE — Assessment & Plan Note (Addendum)
Follow-up with PCP as outpatient. ?

## 2022-03-24 NOTE — TOC Progression Note (Signed)
Transition of Care (TOC) - Progression Note  ? ? ?Patient Details  ?Name: Logan Vazquez ?MRN: [id number] ?Date of Birth: [birth year] ? ?Transition of Care (TOC) CM/SW Contact  ?Geoff Dacanay E Romell Cavanah, LCSW ?Phone Number: ?03/24/2022, 2:04 PM ? ?Clinical Narrative:   TOC following for PT reeval post surgery. Previous plan for SNF.  ? ? ? ?Expected Discharge Plan: Skilled Nursing Facility ?Barriers to Discharge: Continued Medical Work up ? ?Expected Discharge Plan and Services ?Expected Discharge Plan: Skilled Nursing Facility ?  ?Discharge Planning Services: CM Consult ?  ?Living arrangements for the past 2 months: Single Family Home ?                ?  ?  ?  ?  ?  ?  ?  ?  ?  ?  ? ? ?Social Determinants of Health (SDOH) Interventions ?  ? ?Readmission Risk Interventions ?   ? View : No data to display.  ?  ?  ?  ? ? ?

## 2022-03-24 NOTE — Progress Notes (Signed)
PT Cancellation Note ? ?Patient Details ?Name: Logan Vazquez ?MRN: [id number] ?DOB: 1946-02-05 ? ? ?Cancelled Treatment:    Reason Eval/Treat Not Completed: Other (comment).  Chart reviewed.  Pt resting in bed upon PT arrival.  Pt firmly declining PT session multiple times.  Therapist attempted to encourage pt to participate in therapy but pt continuing to refuse and stating "I'll let you know when I am ready".  Will re-attempt PT session at a later date/time. ? ?Hendricks Limes, PT ?03/24/22, 1:20 PM ? ?

## 2022-03-24 NOTE — Progress Notes (Signed)
?  Progress Note ? ? ?Patient: Logan Vazquez WFU:[id number] DOB: 26-Apr-1946 DOA: 03/13/2022     10 ?DOS: the patient was seen and examined on 03/24/2022 ?  ?Brief hospital course: ?GABRIAL DOMINE is a 76 y.o. male with medical history significant of  CAD, copd, htn, seen in ed for fall pt fell off bed.  He had acute renal failure with rhabdomyolysis.  Received IV fluids.  Condition has resolved afterwards. ?Patient was also found to have aortic arterial dilation of 6.2 cm. ?Patient had stress nuclear test 4/5, deemed to be a lower risk.   EAVR performed 4/7.  ?Patient has been seen by PT/OT, recommended nursing placement.  Pending placement. ? ?Assessment and Plan: ?* Fall at home, initial encounter ?Continue PT/OT, pending nursing home placement. ? ?Rhabdomyolysis ?From the fall, resolved. ? ? ?Essential hypertension ?Continue current treatment. ? ? ?Syncope and collapse ?Improved.  Not additional occurrence ? ?COPD (chronic obstructive pulmonary disease) (HCC) ?No bronchospasm. ? ?Chronic hip pain, left ?Secondary to osteoarthritis.  Continue symptomatic treatment. ? ?GERD (gastroesophageal reflux disease) ?Cont ppi therapy.  ? ? ?Insomnia ?Patient could not sleep last night, has frontal headache this morning.  Give as needed Tylenol.  Patient also took trazodone last night, did not work.  Will change to Seroquel. ? ?Hypokalemia ?Resolved. ? ?Pre-operative cardiovascular examination ?Patient has been eval by cardiology, nuclear stress test showed low cardiac risk.  Patient did well with surgery. ? ?Coronary artery disease involving native coronary artery of native heart without angina pectoris ?Patient has no chest pain. ? ?Anemia ?Hemoglobin 9.5 postop.  Stable ? ? ? ?AKI (acute kidney injury) (HCC) ?Renal function has normalized. ? ? ?Abdominal aortic aneurysm (AAA) (HCC) ? EVAR performed 3/7.  Doing well.  Has some leukocytosis, probably from surgery.  Patient doing well postop, leukocytosis much better today.   Pending nursing home placement. ? ? ? ? ?  ? ?Subjective:  ?Patient could not sleep last night, developed significant headache this morning.  Frontal headache, no nausea vomiting.  Some nasal congestion. ?No pain in the legs. ? ?Physical Exam: ?Vitals:  ? 03/23/22 1615 03/23/22 1937 03/23/22 2356 03/24/22 0306  ?BP: (!) 123/58 102/60  (!) 92/54  ?Pulse: 71 64    ?Resp: 16 16 14 16   ?Temp: 98.6 ?F (37 ?C) 97.7 ?F (36.5 ?C)  98.9 ?F (37.2 ?C)  ?TempSrc: Oral Oral  Oral  ?SpO2: 96% 95%  97%  ?Weight:      ?Height:      ? ?General exam: Appears calm and comfortable  ?Respiratory system: Clear to auscultation. Respiratory effort normal. ?Cardiovascular system: S1 & S2 heard, RRR. No JVD, murmurs, rubs, gallops or clicks. No pedal edema. ?Gastrointestinal system: Abdomen is nondistended, soft and nontender. No organomegaly or masses felt. Normal bowel sounds heard. ?Central nervous system: Alert and oriented. No focal neurological deficits. ?Extremities: Symmetric 5 x 5 power. ?Skin: No rashes, lesions or ulcers ?Psychiatry: Judgement and insight appear normal. Mood & affect appropriate.  ? ?Data Reviewed: ? ?Lab results reviewed. ? ?Family Communication: Sister not available today. ? ?Disposition: ?Status is: Inpatient ?Remains inpatient appropriate because: Unsafe discharge, pending nursing home placement. ? Planned Discharge Destination: Skilled nursing facility ? ? ? ?Time spent: 23 minutes ? ?Author: , MD ?03/24/2022 10:04 AM ? ?For on call review [email or web address].  ?

## 2022-03-24 NOTE — Progress Notes (Signed)
Pt. Complaining that he has been unable to sleep tonight and asking for something else to help him sleep. Manuela Schwartz, NP paged for orders.  ?

## 2022-03-25 DIAGNOSIS — R531 Weakness: Secondary | ICD-10-CM

## 2022-03-25 DIAGNOSIS — Y92009 Unspecified place in unspecified non-institutional (private) residence as the place of occurrence of the external cause: Secondary | ICD-10-CM | POA: Diagnosis not present

## 2022-03-25 DIAGNOSIS — W19XXXA Unspecified fall, initial encounter: Secondary | ICD-10-CM | POA: Diagnosis not present

## 2022-03-25 DIAGNOSIS — T796XXA Traumatic ischemia of muscle, initial encounter: Secondary | ICD-10-CM | POA: Diagnosis not present

## 2022-03-25 DIAGNOSIS — I7143 Infrarenal abdominal aortic aneurysm, without rupture: Secondary | ICD-10-CM | POA: Diagnosis not present

## 2022-03-25 MED ORDER — MELATONIN 5 MG PO TABS
2.5000 mg | ORAL_TABLET | Freq: Every day | ORAL | Status: DC
  Administered 2022-03-25: 2.5 mg via ORAL
  Filled 2022-03-25: qty 1

## 2022-03-25 MED ORDER — QUETIAPINE FUMARATE 25 MG PO TABS
50.0000 mg | ORAL_TABLET | Freq: Every day | ORAL | Status: DC
Start: 2022-03-25 — End: 2022-03-26
  Administered 2022-03-25: 50 mg via ORAL
  Filled 2022-03-25: qty 2

## 2022-03-25 MED ORDER — TIZANIDINE HCL 2 MG PO TABS
2.0000 mg | ORAL_TABLET | Freq: Once | ORAL | Status: AC
  Administered 2022-03-25: 2 mg via ORAL
  Filled 2022-03-25: qty 1

## 2022-03-25 MED ORDER — OXYCODONE HCL 5 MG PO TABS
5.0000 mg | ORAL_TABLET | Freq: Four times a day (QID) | ORAL | Status: DC | PRN
Start: 2022-03-25 — End: 2022-03-26
  Administered 2022-03-25 – 2022-03-26 (×2): 5 mg via ORAL
  Filled 2022-03-25 (×2): qty 1

## 2022-03-25 NOTE — Progress Notes (Addendum)
?  Progress Note ? ? ?Patient: Logan Vazquez DGU:[id number] DOB: 01/09/1946 DOA: 03/13/2022     11 ?DOS: the patient was seen and examined on 03/25/2022 ?  ?Brief hospital course: ?Logan Vazquez is a 76 y.o. male with medical history significant of  CAD, copd, htn, seen in ed for fall pt fell off bed.  He had acute renal failure with rhabdomyolysis.  Received IV fluids.  Condition has resolved afterwards. ?Patient was also found to have aortic arterial dilation of 6.2 cm. ?Patient had stress nuclear test 4/5, deemed to be a lower risk.   EAVR performed 4/7.  ?Patient has been seen by PT/OT, recommended nursing placement.  Pending placement.  Patient is medically stable to be transferred. ? ?Assessment and Plan: ?* Fall at home, initial encounter ?Continue PT/OT, pending nursing home placement. ? ?Rhabdomyolysis ?Resolved. ? ?Essential hypertension ?Blood pressure stable, no need to change treatment. ? ? ?Syncope and collapse ?Resolved. ? ?COPD (chronic obstructive pulmonary disease) (HCC) ?No exacerbation, stable. ? ?Chronic hip pain, left ?Secondary to osteoarthritis.  Continue symptomatic treatment. ? ?GERD (gastroesophageal reflux disease) ?Cont ppi therapy.  ? ? ?Generalized weakness ?Patient has significant weakness, pending PT/OT for nursing home placement. ? ?Insomnia ?Patient slept better after giving Seroquel ? ?Hypokalemia ?Resolved. ? ?Pre-operative cardiovascular examination ?Patient has been eval by cardiology, nuclear stress test showed low cardiac risk.  Patient did well with surgery. ? ?Coronary artery disease involving native coronary artery of native heart without angina pectoris ?Stable, no symptoms ? ?Anemia ?Hemoglobin 9.5 postop.  Stable ? ? ? ?AKI (acute kidney injury) (HCC) ?Resolved ? ? ?Abdominal aortic aneurysm (AAA) (HCC) ? EVAR performed 3/7.  Patient doing very well. ? ? ? ? ?  ? ?Subjective:  ?Patient doing well today, good appetite without nausea vomiting.  Still complaining of  weakness, ? ?Physical Exam: ?Vitals:  ? 03/24/22 2108 03/25/22 9563 03/25/22 8756 03/25/22 0916  ?BP:  120/71 115/60 (!) 118/59  ?Pulse: (!) 59 65  72  ?Resp:  16  16  ?Temp:  98.6 ?F (37 ?C)  98.6 ?F (37 ?C)  ?TempSrc:      ?SpO2:  97%  96%  ?Weight:      ?Height:      ? ?General exam: Appears calm and comfortable  ?Respiratory system: Clear to auscultation. Respiratory effort normal. ?Cardiovascular system: S1 & S2 heard, RRR. No JVD, murmurs, rubs, gallops or clicks. No pedal edema. ?Gastrointestinal system: Abdomen is nondistended, soft and nontender. No organomegaly or masses felt. Normal bowel sounds heard. ?Central nervous system: Alert and oriented. No focal neurological deficits. ?Extremities: Symmetric 5 x 5 power. ?Skin: No rashes, lesions or ulcers ?Psychiatry: Judgement and insight appear normal. Mood & affect appropriate.  ? ?Data Reviewed: ? ?There are no new results to review at this time. ? ?Family Communication: sister updated ? ?Disposition: ?Status is: Inpatient ?Remains inpatient appropriate because: Unsafe discharge ? Planned Discharge Destination: Skilled nursing facility ? ? ? ?Time spent: 26 minutes ? ?Author: ?Marrion Coy, MD ?03/25/2022 1:40 PM ? ?For on call review [email or web address].  ?

## 2022-03-25 NOTE — Evaluation (Signed)
Physical Therapy Re-Evaluation ?Patient Details ?Name: Logan Vazquez ?MRN: [id number] ?DOB: Jul 14, 1946 ?Today's Date: 03/25/2022 ? ?History of Present Illness ? Logan Vazquez is a 76 y.o. male with medical history significant of CAD, COPD, HTN was seen in ED for fall - pt fell off bed.  He had acute renal failure with rhabdomyolysis. Patient was also found to have aortic arterial dilation of 6.2 cm.  S/p EVAR 4/7 (continue at transfer therapy order received post op).  ?Clinical Impression ? PT re-evaluation performed (pt s/p EVAR 4/7).  Pt agreeable to PT session but reporting 9/10 chronic B knee pain (nurse notified of pt's request for pain meds).  During session pt SBA with bed mobility; CGA with transfers using RW; and CGA to min assist to ambulate 120 feet with RW.  Increasing forward flexed posture noted with increased distance ambulating with pt pushing walker too far out in front requiring cueing for more upright posture (of note pt reporting being limited with upright posture at baseline when first standing up to walker).  Pt reporting feeling fatigued post ambulation (pt also reporting this was the first time up since procedure).  Pt reports living alone and does not appear to have sufficient support/assist to safely discharge home at this time.  Will continue to focus on balance, endurance, strengthening, and progressive functional mobility during hospitalization. ?   ? ?Recommendations for follow up therapy are one component of a multi-disciplinary discharge planning process, led by the attending physician.  Recommendations may be updated based on patient status, additional functional criteria and insurance authorization. ? ?Follow Up Recommendations Skilled nursing-short term rehab (<3 hours/day) ? ?  ?Assistance Recommended at Discharge Intermittent Supervision/Assistance  ?Patient can return home with the following ? A little help with walking and/or transfers;A little help with  bathing/dressing/bathroom;Assistance with cooking/housework;Assist for transportation;Help with stairs or ramp for entrance ? ?  ?Equipment Recommendations Rolling walker (2 wheels);BSC/3in1  ?Recommendations for Other Services ? OT consult  ?  ?Functional Status Assessment Patient has had a recent decline in their functional status and demonstrates the ability to make significant improvements in function in a reasonable and predictable amount of time.  ? ?  ?Precautions / Restrictions Precautions ?Precautions: Fall ?Restrictions ?Weight Bearing Restrictions: No  ? ?  ? ?Mobility ? Bed Mobility ?Overal bed mobility: Needs Assistance ?Bed Mobility: Supine to Sit, Sit to Supine ?  ?  ?Supine to sit: Supervision, HOB elevated ?Sit to supine: Supervision, HOB elevated ?  ?General bed mobility comments: increased effort to perform on own ?  ? ?Transfers ?Overall transfer level: Needs assistance ?Equipment used: Rolling walker (2 wheels) ?Transfers: Sit to/from Stand ?Sit to Stand: Min guard ?  ?  ?  ?  ?  ?General transfer comment: mildly elevated bed height ?  ? ?Ambulation/Gait ?Ambulation/Gait assistance: Min guard, Min assist ?Gait Distance (Feet): 120 Feet ?Assistive device: Rolling walker (2 wheels) ?  ?Gait velocity: decreased ?  ?  ?General Gait Details: increasing forward posture noted with increased distance ambulating with pt pushing walker too far out in front requiring cueing for more upright posture (although pt reporting being limited with upright posture at baseline when first standing up to walker); mild L foot drop ? ?Stairs ?  ?  ?  ?  ?  ? ?Wheelchair Mobility ?  ? ?Modified Rankin (Stroke Patients Only) ?  ? ?  ? ?Balance Overall balance assessment: Needs assistance ?Sitting-balance support: No upper extremity supported, Feet supported ?Sitting balance-Leahy Scale: Good ?  Sitting balance - Comments: steady sitting reaching within BOS ?  ?Standing balance support: Bilateral upper extremity supported,  During functional activity, Reliant on assistive device for balance ?Standing balance-Leahy Scale: Fair ?Standing balance comment: forward flexed posture; reliant on walker for support ?  ?  ?  ?  ?  ?  ?  ?  ?  ?  ?  ?   ? ? ? ?Pertinent Vitals/Pain Pain Assessment ?Pain Assessment: 0-10 ?Pain Score: 9  ?Pain Location: B knees (chronic pain per pt report) ?Pain Descriptors / Indicators: Aching, Sore ?Pain Intervention(s): Limited activity within patient's tolerance, Monitored during session, Repositioned, Patient requesting pain meds-RN notified ?Vitals (HR and O2 on room air) stable and WFL throughout treatment session.  ? ? ?Home Living Family/patient expects to be discharged to:: Private residence ?Living Arrangements: Alone ?Available Help at Discharge:  (pt's sister stops by about 3x/week; sitter/PCA just started coming 1 day/week) ?  ?Home Access: Stairs to enter ?Entrance Stairs-Rails: None ?Entrance Stairs-Number of Steps: 2 ?  ?Home Layout: One level ?Home Equipment: Agricultural consultant (2 wheels) ?   ?  ?Prior Function Prior Level of Function : Independent/Modified Independent ?  ?  ?  ?  ?  ?  ?Mobility Comments: Ambulatory with RW; h/o falls ?  ?  ? ? ?Hand Dominance  ?   ? ?  ?Extremity/Trunk Assessment  ? Upper Extremity Assessment ?Upper Extremity Assessment: Defer to OT evaluation;Generalized weakness ?  ? ?Lower Extremity Assessment ?Lower Extremity Assessment: Generalized weakness ?  ? ?Cervical / Trunk Assessment ?Cervical / Trunk Assessment: Other exceptions ?Cervical / Trunk Exceptions: forward head/shoulders  ?Communication  ? Communication: No difficulties  ?Cognition Arousal/Alertness: Awake/alert ?Behavior During Therapy: [facility] for tasks assessed/performed ?Overall Cognitive Status: Within Functional Limits for tasks assessed ?  ?  ?  ?  ?  ?  ?  ?  ?  ?  ?  ?  ?  ?  ?  ?  ?General Comments: Pt able to verbalize person, place, and situation (date not asked); some decreased insight/safety awareness  note with activities ?  ?  ? ?  ?General Comments  Nursing cleared pt for participation in physical therapy.  Pt agreeable to PT session. ? ? ?  ?Exercises    ? ?Assessment/Plan  ?  ?PT Assessment Patient needs continued PT services  ?PT Problem List Decreased strength;Decreased range of motion;Decreased activity tolerance;Decreased balance;Decreased safety awareness;Decreased knowledge of use of DME;Decreased coordination;Decreased cognition;Decreased mobility;Decreased skin integrity ? ?   ?  ?PT Treatment Interventions DME instruction;Gait training;Stair training;Functional mobility training;Therapeutic activities;Therapeutic exercise;Balance training;Cognitive remediation;Neuromuscular re-education;Patient/family education   ? ?PT Goals (Current goals can be found in the Care Plan section)  ?Acute Rehab PT Goals ?Patient Stated Goal: get strong enough to go home ?PT Goal Formulation: With patient ?Time For Goal Achievement: 04/08/22 ?Potential to Achieve Goals: Good ? ?  ?Frequency Min 2X/week ?  ? ? ?Co-evaluation   ?  ?  ?  ?  ? ? ?  ?AM-PAC PT "6 Clicks" Mobility  ?Outcome Measure Help needed turning from your back to your side while in a flat bed without using bedrails?: None ?Help needed moving from lying on your back to sitting on the side of a flat bed without using bedrails?: A Little ?Help needed moving to and from a bed to a chair (including a wheelchair)?: A Little ?Help needed standing up from a chair using your arms (e.g., wheelchair or bedside chair)?: A Little ?Help needed  to walk in hospital room?: A Little ?Help needed climbing 3-5 steps with a railing? : A Little ?6 Click Score: 19 ? ?  ?End of Session Equipment Utilized During Treatment: Gait belt ?Activity Tolerance: Patient limited by fatigue ?Patient left: in bed;with call bell/phone within reach;with bed alarm set ?Nurse Communication: Mobility status;Precautions;Patient requests pain meds ?PT Visit Diagnosis: Muscle weakness (generalized)  (M62.81);Difficulty in walking, not elsewhere classified (R26.2);Unsteadiness on feet (R26.81) ?  ? ?Time: 8502-7741 ?PT Time Calculation (min) (ACUTE ONLY): 18 min ? ? ?Charges:   PT Evaluation ?$PT Re-evaluation: 1 Re-eval

## 2022-03-25 NOTE — Care Management Important Message (Signed)
Important Message ? ?Patient Details  ?Name: Logan Vazquez ?MRN: [id number] ?Date of Birth: 1946-04-29 ? ? ?Medicare Important Message Given:  Yes ? ? ? ? ?Johnell Comings ?03/25/2022, 12:05 PM ?

## 2022-03-25 NOTE — Assessment & Plan Note (Addendum)
Patient has significant risk and frequent fall.  Patient has been eval by PT/OT, recommended nursing home placement.  Patient is adamant he does not want nursing home.  At this point, patient has the capacity to make his own decision.  We do not have any other choice other than sending him home.  But I will set up RN, nursing aide, PT and OT. ?

## 2022-03-25 NOTE — Progress Notes (Signed)
The patient's sister, Primitivo Gauze, called and is concerned about patient being discharged to home alone. She will note be able to assist him once he is home and is concerned about his safety.  MD and case manager updated.  ?

## 2022-03-25 NOTE — TOC Progression Note (Signed)
Transition of Care (TOC) - Progression Note  ? ? ?Patient Details  ?Name: Logan Vazquez ?MRN: [id number] ?Date of Birth: Dec 22, 1945 ? ?Transition of Care (TOC) CM/SW Contact  ?Truddie Hidden, RN ?Phone Number: ?03/25/2022, 5:04 PM ? ?Clinical Narrative:    ? ?Patient Aox4. Spoke to him about bed offers. Patient refuses [facility] and [address]. All othe bed offeres are to far. Stated his sister would be able to assist him at home. ? ?Expected Discharge Plan: Skilled Nursing Facility ?Barriers to Discharge: Continued Medical Work up ? ?Expected Discharge Plan and Services ?Expected Discharge Plan: Skilled Nursing Facility ?  ?Discharge Planning Services: CM Consult ?  ?Living arrangements for the past 2 months: Single Family Home ?                ?  ?  ?  ?  ?  ?  ?  ?  ?  ?  ? ? ?Social Determinants of Health (SDOH) Interventions ?  ? ?Readmission Risk Interventions ?   ? View : No data to display.  ?  ?  ?  ? ? ?

## 2022-03-25 NOTE — Evaluation (Signed)
Occupational Therapy Re-evaluation ?Patient Details ?Name: Logan Vazquez ?MRN: [id number] ?DOB: 05-15-1946 ?Today's Date: 03/25/2022 ? ? ?History of Present Illness Logan Vazquez is a 76 y.o. male with medical history significant of CAD, COPD, HTN was seen in ED for fall - pt fell off bed.  He had acute renal failure with rhabdomyolysis. Patient was also found to have aortic arterial dilation of 6.2 cm.  S/p EVAR 4/7 (continue at transfer therapy order received post op).  ? ?Clinical Impression ?  ?Pt seen for OT re-evaluation (pt s/p EVAR 4/7) on this date. Upon arrival to room, pt awake and seated upright in bed. Pt initially declining to participate in OT tx however agreeable following MIN encouragement. Pt currently requires SUPERVISION for bed mobility, MIN GUARD-MIN A to walk household distances (MIN A to navigate environmental obstacles and open doors, otherwise MIN GUARD), and MIN GUARD for toilet transfers d/t decreased activity tolerance. Following toilet transfer, pt initiated standing hand hygiene. During standing grooming task, pt was observed to fill sink with water and then dip hands in sink (as opposed to washing hands under running water). When asked if pt typically washes hands in this manner, pt became agitated and stated "thats the problem around here, you all talk too much". Pt easily re-directable and returned to bed to complete 1 additional grooming task while seated EOB, requiring only SET-UP assist. Pt is making good progress toward goals and continues to benefit from skilled OT services to maximize return to PLOF and minimize risk of future falls, injury, and readmission. Will continue to follow POC. Discharge recommendation remains appropriate unless pt able to have intermittent supervision/assistance for ADLs/functional mobility at home.  ?   ? ?Recommendations for follow up therapy are one component of a multi-disciplinary discharge planning process, led by the attending physician.   Recommendations may be updated based on patient status, additional functional criteria and insurance authorization.  ? ?Follow Up Recommendations ? Skilled nursing-short term rehab (<3 hours/day)  ?  ?Assistance Recommended at Discharge Intermittent Supervision/Assistance  ?Patient can return home with the following A little help with walking and/or transfers;A little help with bathing/dressing/bathroom;Help with stairs or ramp for entrance ? ?  ?   ?Equipment Recommendations ? BSC/3in1  ?  ?   ?Precautions / Restrictions Precautions ?Precautions: Fall ?Restrictions ?Weight Bearing Restrictions: No  ? ?  ? ?Mobility Bed Mobility ?Overal bed mobility: Needs Assistance ?Bed Mobility: Supine to Sit, Sit to Supine ?  ?  ?Supine to sit: Supervision, HOB elevated ?Sit to supine: Supervision, HOB elevated ?  ?  ?  ? ?Transfers ?Overall transfer level: Needs assistance ?Equipment used: Rolling walker (2 wheels) ?Transfers: Sit to/from Stand ?Sit to Stand: Min guard ?  ?  ?  ?  ?  ?  ?  ? ?  ?Balance Overall balance assessment: Needs assistance ?Sitting-balance support: No upper extremity supported, Feet supported ?Sitting balance-Leahy Scale: Good ?Sitting balance - Comments: steady sitting reaching within BOS ?  ?Standing balance support: Bilateral upper extremity supported, During functional activity, Reliant on assistive device for balance ?Standing balance-Leahy Scale: Fair ?Standing balance comment: forward flexed posture; reliant on walker for support ?  ?  ?  ?  ?  ?  ?  ?  ?  ?  ?  ?   ? ?ADL either performed or assessed with clinical judgement  ? ?ADL Overall ADL's : Needs assistance/impaired ?  ?  ?Grooming: Wash/dry hands;Min guard;Standing;Wash/dry face;Supervision/safety;Set up;Sitting ?Grooming Details (indicate cue type  and reason): Pt observed to fill sink with water and then dip hands in sink (as opposed to washing hands under running water). When asked if pt typically washes hands in this manner, pt became  agitated and stated "thats the problem around here, you all talk too much". ?  ?  ?  ?  ?  ?  ?  ?  ?Toilet Transfer: Min guard;Ambulation;Regular Toilet;Rolling walker (2 wheels);Grab bars ?  ?  ?  ?  ?  ?Functional mobility during ADLs: Min guard;Minimal assistance;Rolling walker (2 wheels) (requires MIN A at times to navigate environmental obstacles and manage doors) ?   ? ? ? ? ?Pertinent Vitals/Pain Pain Assessment ?Pain Assessment: No/denies pain  ? ? ? ?   ?Extremity/Trunk Assessment Upper Extremity Assessment ?Upper Extremity Assessment: Defer to OT evaluation;Generalized weakness ?  ?Lower Extremity Assessment ?Lower Extremity Assessment: Generalized weakness ?  ?Cervical / Trunk Assessment ?Cervical / Trunk Assessment: Other exceptions ?Cervical / Trunk Exceptions: forward head/shoulders ?  ?Communication Communication ?Communication: No difficulties ?  ?Cognition Arousal/Alertness: Awake/alert ?Behavior During Therapy: [facility] for tasks assessed/performed ?Overall Cognitive Status: No family/caregiver present to determine baseline cognitive functioning ?  ?  ?  ?  ?  ?  ?  ?  ?  ?  ?  ?  ?  ?  ?  ?  ?General Comments: Pt oriented to person, place, and situation. Presents with decreased awareness of deficits and safety awareness ?  ?  ?   ?   ?   ? ? ?Home Living Family/patient expects to be discharged to:: Private residence ?Living Arrangements: Alone ?Available Help at Discharge:  (pt's sister stops by about 3x/week; sitter/PCA just started coming 1 day/week) ?  ?Home Access: Stairs to enter ?Entrance Stairs-Number of Steps: 2 ?Entrance Stairs-Rails: None ?Home Layout: One level ?  ?  ?  ?  ?  ?  ?  ?Home Equipment: Conservation officer, nature (2 wheels) ?  ?  ?  ? ?  ?Prior Functioning/Environment Prior Level of Function : Independent/Modified Independent ?  ?  ?  ?  ?  ?  ?Mobility Comments: Ambulatory with RW; h/o falls ?  ?  ? ?  ?  ?   ?   ?   ?OT Goals(Current goals can be found in the care plan section) Acute  Rehab OT Goals ?Patient Stated Goal: to not fall ?OT Goal Formulation: With patient ?Time For Goal Achievement: 04/08/22 ?Potential to Achieve Goals: Good  ?OT Frequency: Min 2X/week ?  ? ?   ?AM-PAC OT "6 Clicks" Daily Activity     ?Outcome Measure Help from another person eating meals?: None ?Help from another person taking care of personal grooming?: A Little ?Help from another person toileting, which includes using toliet, bedpan, or urinal?: A Little ?Help from another person bathing (including washing, rinsing, drying)?: A Little ?Help from another person to put on and taking off regular upper body clothing?: A Little ?Help from another person to put on and taking off regular lower body clothing?: A Little ?6 Click Score: 19 ?  ?End of Session Equipment Utilized During Treatment: Rolling walker (2 wheels) ?Nurse Communication: Mobility status ? ?Activity Tolerance: Patient tolerated treatment well ?Patient left: in bed;with call bell/phone within reach;with bed alarm set ? ?OT Visit Diagnosis: Other abnormalities of gait and mobility (R26.89);History of falling (Z91.81)  ?              ?Time: [id number] ?OT Time Calculation (min): 25 min ?Charges:  OT General Charges ?$OT Visit: 1 Visit ?OT Evaluation ?$OT Re-eval: 1 Re-eval ?OT Treatments ?$Self Care/Home Management : 8-22 mins ? ?Fredirick Maudlin, OTR/L ?[place] ? ?

## 2022-03-26 DIAGNOSIS — W19XXXA Unspecified fall, initial encounter: Secondary | ICD-10-CM | POA: Diagnosis not present

## 2022-03-26 DIAGNOSIS — R55 Syncope and collapse: Secondary | ICD-10-CM | POA: Diagnosis not present

## 2022-03-26 DIAGNOSIS — T796XXA Traumatic ischemia of muscle, initial encounter: Secondary | ICD-10-CM | POA: Diagnosis not present

## 2022-03-26 DIAGNOSIS — I7143 Infrarenal abdominal aortic aneurysm, without rupture: Secondary | ICD-10-CM | POA: Diagnosis not present

## 2022-03-26 MED ORDER — CLOPIDOGREL BISULFATE 75 MG PO TABS: 75.0000 mg | ORAL_TABLET | Freq: Every day | ORAL | 0 refills | Status: DC

## 2022-03-26 MED ORDER — METOPROLOL TARTRATE 25 MG PO TABS: 12.5000 mg | ORAL_TABLET | Freq: Two times a day (BID) | ORAL | 0 refills | Status: DC

## 2022-03-26 NOTE — Discharge Summary (Signed)
?Physician Discharge Summary ?  ?Patient: Logan Vazquez MRN: [id number] DOB: 04-28-1946  ?Admit date:     03/13/2022  ?Discharge date: 03/26/22  ?Discharge Physician: Marrion Coy  ? ?PCP: Logan Conroy, MD  ? ?Recommendations at discharge:  ? ?Follow-up with Dr. Wyn Quaker as scheduled in 2 weeks. ?Follow-up with PCP in 1 week ? ?Discharge Diagnoses: ?Principal Problem: ?  Fall at home, initial encounter ?Active Problems: ?  Essential hypertension ?  Rhabdomyolysis ?  Syncope and collapse ?  COPD (chronic obstructive pulmonary disease) (HCC) ?  GERD (gastroesophageal reflux disease) ?  Chronic hip pain, left ?  Abdominal aortic aneurysm (AAA) (HCC) ?  AKI (acute kidney injury) (HCC) ?  Anemia ?  Coronary artery disease involving native coronary artery of native heart without angina pectoris ?  Hypokalemia ?  Insomnia ?  Generalized weakness ? ?Resolved Problems: ?  * No resolved hospital problems. * ? ?Hospital Course: ?Logan Vazquez is a 76 y.o. male with medical history significant of  CAD, copd, htn, seen in ed for fall pt fell off bed.  He had acute renal failure with rhabdomyolysis.  Received IV fluids.  Condition has resolved afterwards. ?Patient was also found to have aortic arterial dilation of 6.2 cm. ?Patient had stress nuclear test 4/5, deemed to be a lower risk.   EAVR performed 4/7.  ?Patient has been seen by PT/OT, recommended nursing placement.  Pending placement.  Patient is medically stable to be transferred. ? ?Assessment and Plan: ?* Fall at home, initial encounter ?Continue PT/OT ? ?Rhabdomyolysis ?Resolved. ? ?Essential hypertension ?Patient is treated with low dose metoprolol, heart rate and blood pressure stable.  Discontinued high-dose lisinopril. ? ? ?Syncope and collapse ?Resolved. ? ?COPD (chronic obstructive pulmonary disease) (HCC) ?No exacerbation, stable. ? ?Chronic hip pain, left ?Secondary to osteoarthritis.  Continue symptomatic treatment. ? ?GERD (gastroesophageal reflux  disease) ?Cont ppi therapy.  ? ? ?Generalized weakness ?Patient has significant risk and frequent fall.  Patient has been eval by PT/OT, recommended nursing home placement.  Patient is adamant he does not want nursing home.  At this point, patient has the capacity to make his own decision.  We do not have any other choice other than sending him home.  But I will set up RN, nursing aide, PT and OT. ? ?Insomnia ?Follow-up with PCP as outpatient ? ?Hypokalemia ?Resolved. ? ?Pre-operative cardiovascular examination ?Patient has been eval by cardiology, nuclear stress test showed low cardiac risk.  Patient did well with surgery. ? ?Coronary artery disease involving native coronary artery of native heart without angina pectoris ?Stable, no symptoms ? ?Anemia ?Hemoglobin 9.5 postop.  Stable ? ? ? ?AKI (acute kidney injury) (HCC) ?Resolved ? ? ?Abdominal aortic aneurysm (AAA) (HCC) ? EVAR performed 3/7.  Patient doing very well. ? ? ?Mild hyponatremia ?Stable. ? ?  ? ? ?Consultants: vascular ?Procedures performed: EVAR  ?Disposition: Home health ?Diet recommendation:  ?Discharge Diet Orders (From admission, onward)  ? ?  Start     Ordered  ? 03/26/22 0000  Diet - low sodium heart healthy       ? 03/26/22 1035  ? ?  ?  ? ?  ? ?Cardiac diet ?DISCHARGE MEDICATION: ?Allergies as of 03/26/2022   ? ?   Reactions  ? Ibuprofen Nausea And Vomiting  ? ?  ? ?  ?Medication List  ?  ? ?STOP taking these medications   ? ?lisinopril 40 MG tablet ?Commonly known as: ZESTRIL ?  ? ?  ? ?  TAKE these medications   ? ?albuterol 108 (90 Base) MCG/ACT inhaler ?Commonly known as: VENTOLIN HFA ?Inhale 1-2 puffs into the lungs every 4 (four) hours as needed. ?  ?amLODipine 10 MG tablet ?Commonly known as: NORVASC ?Take 10 mg by mouth daily. ?  ?aspirin EC 81 MG tablet ?Take 81 mg by mouth. ?  ?atorvastatin 40 MG tablet ?Commonly known as: LIPITOR ?Take 1 tablet (40 mg total) by mouth daily. ?  ?B-12 2000 MCG Tabs ?Take 1 tablet by mouth daily. ?   ?busPIRone 15 MG tablet ?Commonly known as: BUSPAR ?Take 15 mg by mouth 2 (two) times daily. ?  ?clopidogrel 75 MG tablet ?Commonly known as: PLAVIX ?Take 1 tablet (75 mg total) by mouth daily. ?Start taking on: March 27, 2022 ?  ?docusate sodium 100 MG capsule ?Commonly known as: COLACE ?Take 100 mg by mouth 2 (two) times daily. ?  ?Flovent HFA 220 MCG/ACT inhaler ?Generic drug: fluticasone ?Inhale 2 puffs into the lungs 2 (two) times daily. ?  ?FLUoxetine 40 MG capsule ?Commonly known as: PROZAC ?Take 40 mg by mouth daily. ?  ?hydrocortisone 2.5 % rectal cream ?Commonly known as: ANUSOL-HC ?Place 1 application rectally 2 (two) times daily. ?  ?loperamide 2 MG capsule ?Commonly known as: IMODIUM ?Take 2 mg by mouth as needed. ?  ?metoprolol tartrate 25 MG tablet ?Commonly known as: LOPRESSOR ?Take 0.5 tablets (12.5 mg total) by mouth 2 (two) times daily. ?  ?mupirocin cream 2 % ?Commonly known as: BACTROBAN ?Apply 1 application topically 2 (two) times daily. ?  ?nitroGLYCERIN 0.4 MG SL tablet ?Commonly known as: NITROSTAT ?Place under the tongue. ?  ?omeprazole 40 MG capsule ?Commonly known as: PRILOSEC ?Take 40 mg by mouth daily. ?  ?oxyCODONE-acetaminophen 10-325 MG tablet ?Commonly known as: PERCOCET ?Take 1 tablet by mouth every 8 (eight) hours as needed for pain. For chronic pain syndrome ?Start taking on: March 30, 2022 ?What changed: Another medication with the same name was removed. Continue taking this medication, and follow the directions you see here. ?  ?pregabalin 75 MG capsule ?Commonly known as: Lyrica ?75 mg qAM, 150 mg qhs ?  ?Spiriva HandiHaler 18 MCG inhalation capsule ?Generic drug: tiotropium ?1 capsule daily. ?  ?tiZANidine 4 MG tablet ?Commonly known as: Zanaflex ?Take 1 tablet (4 mg total) by mouth every 12 (twelve) hours as needed for muscle spasms. ?  ? ?  ? ? Follow-up Information   ? ? Georgiana Spinner, NP Follow up in 4 week(s).   ?Specialty: Vascular Surgery ?Why: See JD or FB in 4  weeks with EVAR ?Contact information: ?[address] ?[place] [address] ?[phone] ? ? ?  ?  ? ? Logan Conroy, MD Follow up in 1 week(s).   ?Specialty: Family Medicine ?Contact information: ?[address] ?[place] [address] ?[phone] ? ? ?  ?  ? ?  ?  ? ?  ? ?Discharge Exam: ?Filed Weights  ? 03/13/22 1519 03/16/22 1300  ?Weight: 57.2 kg 51.9 kg  ? ?General exam: Appears calm and comfortable  ?Respiratory system: Clear to auscultation. Respiratory effort normal. ?Cardiovascular system: S1 & S2 heard, RRR. No JVD, murmurs, rubs, gallops or clicks. No pedal edema. ?Gastrointestinal system: Abdomen is nondistended, soft and nontender. No organomegaly or masses felt. Normal bowel sounds heard. ?Central nervous system: Alert and oriented. No focal neurological deficits. ?Extremities: Symmetric 5 x 5 power. ?Skin: No rashes, lesions or ulcers ?Psychiatry: Judgement and insight appear normal. Mood &  affect appropriate.  ? ? ?Condition at discharge: fair ? ?The results of significant diagnostics from this hospitalization (including imaging, microbiology, ancillary and laboratory) are listed below for reference.  ? ?Imaging Studies: ?CT ABDOMEN PELVIS WO CONTRAST ? ?Result Date: 03/14/2022 ?CLINICAL DATA:  Abdominal pain, acute, nonlocalized LLQ abd pain. EXAM: CT ABDOMEN AND PELVIS WITHOUT CONTRAST TECHNIQUE: Multidetector CT imaging of the abdomen and pelvis was performed following the standard protocol without IV contrast. RADIATION DOSE REDUCTION: This exam was performed according to the departmental dose-optimization program which includes automated exposure control, adjustment of the mA and/or kV according to patient size and/or use of iterative reconstruction technique. COMPARISON:  CT 02/18/2019, thoracic spine radiograph 02/07/2021. FINDINGS: Lower chest: There is peripheral predominant fibrotic change in the lung bases, similar to prior exam in March 2020. Coronary artery  calcifications. No acute findings. Hepatobiliary: No focal liver abnormality is seen. Prior cholecystectomy. Pancreas: Mild pancreatic atrophy.  No ductal dilation. Spleen: Normal in size without focal abnormality. Adrenals/Uri

## 2022-03-26 NOTE — Plan of Care (Signed)
?  Problem: Clinical Measurements: ?Goal: Ability to maintain clinical measurements within normal limits will improve ?03/26/2022 0047 by Verlan Friends, Nelda Marseille, RN ?Outcome: Not Progressing ?03/26/2022 0047 by Verlan Friends, Nelda Marseille, RN ?Outcome: Not Progressing ?  ?Problem: Health Behavior/Discharge Planning: ?Goal: Ability to manage health-related needs will improve ?03/26/2022 0047 by Verlan Friends, Nelda Marseille, RN ?Outcome: Not Progressing ?03/26/2022 0047 by Verlan Friends, Nelda Marseille, RN ?Outcome: Not Progressing ?  ?Problem: Clinical Measurements: ?Goal: Cardiovascular complication will be avoided ?03/26/2022 0047 by Verlan Friends, Nelda Marseille, RN ?Outcome: Not Progressing ?03/26/2022 0047 by Verlan Friends, Nelda Marseille, RN ?Outcome: Not Progressing ?  ?

## 2022-03-26 NOTE — TOC Progression Note (Addendum)
Transition of Care (TOC) - Progression Note  ? ? ?Patient Details  ?Name: Logan Vazquez ?MRN: [id number] ?Date of Birth: [birth year] ? ?Transition of Care (TOC) CM/SW Contact  ?Hetty Ely, RN ?Phone Number: ?03/26/2022, 10:23 AM ? ?Clinical Narrative: TOCRN discussed STR with patient, who refused and says he prefer to go home. Discussed HHS, patient refuse says he already has HHAide, did not know name nor agency. Called Sister Samson Frederic who says Aide comes once week for 3hrs, set up by Caseworker Danley Danker [phone]. Called Ms. Wash to discuss discharge plan and to see if patient can get more hours and other HHS's no answer left voice message to return call. Sister says she can provide transport at discharge.   ?10:40am Morrie Sheldon called back says the 3hrs are provided by DSS and no other hours available. TOCRN put Morrie Sheldon on speak phone to explain to patient that hours can be provided and covered by [employer] and I could make arrangements. Patient no agrees to [facility] services, no preference. Frances Furbish agrees to provide [facility] PT/OT/RN/SW/Aide. Sister to provide transport. [employer] to deliver DME to room. ? ? ? ?Expected Discharge Plan: Skilled Nursing Facility ?Barriers to Discharge: Continued Medical Work up ? ?Expected Discharge Plan and Services ?Expected Discharge Plan: Skilled Nursing Facility ?  ?Discharge Planning Services: CM Consult ?  ?Living arrangements for the past 2 months: Single Family Home ?                ?  ?  ?  ?  ?  ?  ?  ?  ?  ?  ? ? ?Social Determinants of Health (SDOH) Interventions ?  ? ?Readmission Risk Interventions ?   ? View : No data to display.  ?  ?  ?  ? ? ?

## 2022-03-26 NOTE — Anesthesia Postprocedure Evaluation (Signed)
Anesthesia Post Note ? ?Patient: BRADLEY LEES ? ?Procedure(s) Performed: ENDOVASCULAR REPAIR/STENT GRAFT ? ?Patient location during evaluation: PACU ?Anesthesia Type: General ?Level of consciousness: awake and alert ?Pain management: pain level controlled ?Vital Signs Assessment: post-procedure vital signs reviewed and stable ?Respiratory status: spontaneous breathing, nonlabored ventilation, respiratory function stable and patient connected to nasal cannula oxygen ?Cardiovascular status: blood pressure returned to baseline and stable ?Postop Assessment: no apparent nausea or vomiting ?Anesthetic complications: no ? ? ?No notable events documented. ? ? ?Last Vitals:  ?Vitals:  ? 03/26/22 0439 03/26/22 0747  ?BP: (!) 108/55 128/64  ?Pulse: 62 60  ?Resp: 18 16  ?Temp: 36.8 ?C 36.6 ?C  ?SpO2: 96% 97%  ?  ?Last Pain:  ?Vitals:  ? 03/26/22 0803  ?TempSrc:   ?PainSc: 0-No pain  ? ? ?  ?  ?  ?  ?  ?  ? ?Martha Clan ? ? ? ? ?

## 2022-03-26 NOTE — Progress Notes (Signed)
Patient discharged home via private vehicle with sister. Volunteer took him down in wheelchair. All discharge instructions reviewed with verbal understanding. Both PIVs removed with no swelling or redness to site.  ?

## 2022-03-26 NOTE — TOC Transition Note (Signed)
Transition of Care (TOC) - CM/SW Discharge Note ? ? ?Patient Details  ?Name: Logan Vazquez ?MRN: [id number] ?Date of Birth: 07-28-1946 ? ?Transition of Care (TOC) CM/SW Contact:  ?Hetty Ely, RN ?Phone Number: ?03/26/2022, 11:48 AM ? ? ?Clinical Narrative: Patient to discharge home with HHS. Sister Samson Frederic to transport. TOC needs resolved.   ? ? ? ?Final next level of care: Home w Home Health Services ?Barriers to Discharge: Barriers Resolved ? ? ?Patient Goals and CMS Choice ?Patient states their goals for this hospitalization and ongoing recovery are:: Home with HH. ?CMS Medicare.gov Compare Post Acute Care list provided to:: Patient ?Choice offered to / list presented to : Patient ? ?Discharge Placement ?  ?           ?  ?  ?Name of family member notified: Samson Frederic, sister ?Patient and family notified of of transfer: 03/26/22 ? ?Discharge Plan and Services ?  ?Discharge Planning Services: CM Consult ?           ?DME Arranged: 3-N-1, Walker rolling ?DME Agency: [employer] ?Date DME Agency Contacted: 03/26/22 ?Time DME Agency Contacted: 1147 ?Representative spoke with at DME Agency: Bjorn Loser ?HH Arranged: RN, PT, OT, Nurse's Aide, Social Work ?HH Agency: [facility] ?Date HH Agency Contacted: 03/26/22 ?Time HH Agency Contacted: 1030 ?Representative spoke with at [facility] Agency: Denyse Amass ? ?Social Determinants of Health (SDOH) Interventions ?  ? ? ?Readmission Risk Interventions ?   ? View : No data to display.  ?  ?  ?  ? ? ? ? ? ?

## 2022-04-23 ENCOUNTER — Other Ambulatory Visit: Payer: Self-pay | Admitting: Student in an Organized Health Care Education/Training Program

## 2022-04-23 DIAGNOSIS — G894 Chronic pain syndrome: Secondary | ICD-10-CM

## 2022-04-23 DIAGNOSIS — M47894 Other spondylosis, thoracic region: Secondary | ICD-10-CM

## 2022-04-23 DIAGNOSIS — M48061 Spinal stenosis, lumbar region without neurogenic claudication: Secondary | ICD-10-CM

## 2022-04-23 DIAGNOSIS — M47812 Spondylosis without myelopathy or radiculopathy, cervical region: Secondary | ICD-10-CM

## 2022-04-26 ENCOUNTER — Encounter (INDEPENDENT_AMBULATORY_CARE_PROVIDER_SITE_OTHER): Payer: Self-pay | Admitting: Nurse Practitioner

## 2022-04-26 ENCOUNTER — Ambulatory Visit (INDEPENDENT_AMBULATORY_CARE_PROVIDER_SITE_OTHER): Payer: Medicare HMO | Admitting: Nurse Practitioner

## 2022-04-26 ENCOUNTER — Other Ambulatory Visit (INDEPENDENT_AMBULATORY_CARE_PROVIDER_SITE_OTHER): Payer: Self-pay | Admitting: Vascular Surgery

## 2022-04-26 ENCOUNTER — Ambulatory Visit (INDEPENDENT_AMBULATORY_CARE_PROVIDER_SITE_OTHER): Payer: Medicare HMO

## 2022-04-26 VITALS — BP 126/69 | HR 56 | Resp 16 | Wt 118.4 lb

## 2022-04-26 DIAGNOSIS — F17200 Nicotine dependence, unspecified, uncomplicated: Secondary | ICD-10-CM

## 2022-04-26 DIAGNOSIS — Z8679 Personal history of other diseases of the circulatory system: Secondary | ICD-10-CM

## 2022-04-26 DIAGNOSIS — Z9889 Other specified postprocedural states: Secondary | ICD-10-CM

## 2022-04-26 DIAGNOSIS — I7143 Infrarenal abdominal aortic aneurysm, without rupture: Secondary | ICD-10-CM

## 2022-04-27 ENCOUNTER — Encounter (INDEPENDENT_AMBULATORY_CARE_PROVIDER_SITE_OTHER): Payer: Self-pay | Admitting: Nurse Practitioner
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
## 2022-04-29 ENCOUNTER — Other Ambulatory Visit: Payer: Self-pay | Admitting: Student in an Organized Health Care Education/Training Program

## 2022-04-29 DIAGNOSIS — M47812 Spondylosis without myelopathy or radiculopathy, cervical region: Secondary | ICD-10-CM

## 2022-04-29 DIAGNOSIS — M47894 Other spondylosis, thoracic region: Secondary | ICD-10-CM

## 2022-04-29 DIAGNOSIS — G894 Chronic pain syndrome: Secondary | ICD-10-CM

## 2022-04-29 DIAGNOSIS — M48061 Spinal stenosis, lumbar region without neurogenic claudication: Secondary | ICD-10-CM

## 2022-05-22 ENCOUNTER — Telehealth: Payer: Self-pay | Admitting: Student in an Organized Health Care Education/Training Program

## 2022-05-22 NOTE — Telephone Encounter (Signed)
Pt sister called for patient stating that pt is sick. She doesn't want to bring him in since is sick. Pt has an appt schedule for tomorrow. Pt is out of meds and sister wants to know if he can get an VV appt instead. Please give sister an call at [phone]. Thanks

## 2022-05-23 ENCOUNTER — Encounter: Payer: Medicare Other | Admitting: Student in an Organized Health Care Education/Training Program

## 2022-05-23 ENCOUNTER — Telehealth: Payer: Self-pay | Admitting: Student in an Organized Health Care Education/Training Program

## 2022-05-23 NOTE — Telephone Encounter (Signed)
Spoke with Dr. Cherylann Ratel, have pt come to the office for appt on Monday, June 12 at 1330. Ella notified.

## 2022-05-23 NOTE — Telephone Encounter (Signed)
Please give patient sister a call. Samson Frederic she wanted to see about her brother having an VV appt due to him being sick. Sister stated she didn't get call yesterday. Thanks

## 2022-05-23 NOTE — Telephone Encounter (Signed)
I sent a message to Dr. Cherylann Ratel yesterday.

## 2022-05-27 ENCOUNTER — Encounter: Payer: Self-pay | Admitting: Student in an Organized Health Care Education/Training Program

## 2022-05-27 ENCOUNTER — Telehealth: Payer: Self-pay | Admitting: Student in an Organized Health Care Education/Training Program

## 2022-05-27 NOTE — Telephone Encounter (Signed)
Patient sister Samson Frederic called stated that patient is still in an lot of pain. Can't come in today. Patient had an appt last week as well and cancel. Sister stated he will not go the ER. Patients screams when ever has to get up. Please give Samson Frederic an called. [phone]

## 2022-05-27 NOTE — Telephone Encounter (Signed)
Spoke with Samson Frederic, she wishes to reschedule his appt. Call transferred to secretaries.

## 2022-06-08 ENCOUNTER — Encounter
Admission: EM | Disposition: A | Discharge status: Home Health | Payer: Self-pay | Source: Home / Self Care | Attending: Internal Medicine

## 2022-06-08 ENCOUNTER — Other Ambulatory Visit: Payer: Self-pay

## 2022-06-08 ENCOUNTER — Emergency Department: Payer: Medicare HMO

## 2022-06-08 ENCOUNTER — Inpatient Hospital Stay: Payer: Medicare HMO

## 2022-06-08 ENCOUNTER — Encounter: Payer: Self-pay | Admitting: Internal Medicine

## 2022-06-08 ENCOUNTER — Inpatient Hospital Stay
Admission: EM | Admit: 2022-06-08 | Discharge: 2022-06-14 | DRG: 286 | Disposition: A | Discharge status: Home Health | Payer: Medicare HMO | Attending: Internal Medicine | Admitting: Internal Medicine

## 2022-06-08 DIAGNOSIS — Z91148 Patient's other noncompliance with medication regimen for other reason: Secondary | ICD-10-CM

## 2022-06-08 DIAGNOSIS — N401 Enlarged prostate with lower urinary tract symptoms: Secondary | ICD-10-CM | POA: Diagnosis present

## 2022-06-08 DIAGNOSIS — I480 Paroxysmal atrial fibrillation: Secondary | ICD-10-CM | POA: Diagnosis present

## 2022-06-08 DIAGNOSIS — R072 Precordial pain: Secondary | ICD-10-CM | POA: Diagnosis not present

## 2022-06-08 DIAGNOSIS — G4700 Insomnia, unspecified: Secondary | ICD-10-CM | POA: Diagnosis present

## 2022-06-08 DIAGNOSIS — Z8679 Personal history of other diseases of the circulatory system: Secondary | ICD-10-CM | POA: Diagnosis not present

## 2022-06-08 DIAGNOSIS — Z20822 Contact with and (suspected) exposure to covid-19: Secondary | ICD-10-CM | POA: Diagnosis present

## 2022-06-08 DIAGNOSIS — I10 Essential (primary) hypertension: Secondary | ICD-10-CM | POA: Diagnosis present

## 2022-06-08 DIAGNOSIS — Z888 Allergy status to other drugs, medicaments and biological substances status: Secondary | ICD-10-CM

## 2022-06-08 DIAGNOSIS — E785 Hyperlipidemia, unspecified: Secondary | ICD-10-CM | POA: Diagnosis present

## 2022-06-08 DIAGNOSIS — M1990 Unspecified osteoarthritis, unspecified site: Secondary | ICD-10-CM | POA: Diagnosis present

## 2022-06-08 DIAGNOSIS — R413 Other amnesia: Secondary | ICD-10-CM | POA: Diagnosis present

## 2022-06-08 DIAGNOSIS — J439 Emphysema, unspecified: Secondary | ICD-10-CM | POA: Diagnosis present

## 2022-06-08 DIAGNOSIS — K219 Gastro-esophageal reflux disease without esophagitis: Secondary | ICD-10-CM | POA: Diagnosis present

## 2022-06-08 DIAGNOSIS — I714 Abdominal aortic aneurysm, without rupture, unspecified: Secondary | ICD-10-CM | POA: Diagnosis present

## 2022-06-08 DIAGNOSIS — R079 Chest pain, unspecified: Secondary | ICD-10-CM | POA: Diagnosis present

## 2022-06-08 DIAGNOSIS — E538 Deficiency of other specified B group vitamins: Secondary | ICD-10-CM | POA: Diagnosis present

## 2022-06-08 DIAGNOSIS — H409 Unspecified glaucoma: Secondary | ICD-10-CM | POA: Diagnosis present

## 2022-06-08 DIAGNOSIS — I4891 Unspecified atrial fibrillation: Secondary | ICD-10-CM

## 2022-06-08 DIAGNOSIS — R531 Weakness: Secondary | ICD-10-CM | POA: Diagnosis not present

## 2022-06-08 DIAGNOSIS — K5900 Constipation, unspecified: Secondary | ICD-10-CM | POA: Diagnosis not present

## 2022-06-08 DIAGNOSIS — D509 Iron deficiency anemia, unspecified: Secondary | ICD-10-CM | POA: Diagnosis present

## 2022-06-08 DIAGNOSIS — J181 Lobar pneumonia, unspecified organism: Secondary | ICD-10-CM | POA: Diagnosis present

## 2022-06-08 DIAGNOSIS — I213 ST elevation (STEMI) myocardial infarction of unspecified site: Secondary | ICD-10-CM | POA: Diagnosis present

## 2022-06-08 DIAGNOSIS — F17210 Nicotine dependence, cigarettes, uncomplicated: Secondary | ICD-10-CM | POA: Diagnosis present

## 2022-06-08 DIAGNOSIS — Z7951 Long term (current) use of inhaled steroids: Secondary | ICD-10-CM

## 2022-06-08 DIAGNOSIS — F32A Depression, unspecified: Secondary | ICD-10-CM | POA: Diagnosis present

## 2022-06-08 DIAGNOSIS — R338 Other retention of urine: Secondary | ICD-10-CM | POA: Diagnosis present

## 2022-06-08 DIAGNOSIS — Z955 Presence of coronary angioplasty implant and graft: Secondary | ICD-10-CM | POA: Diagnosis not present

## 2022-06-08 DIAGNOSIS — G8929 Other chronic pain: Secondary | ICD-10-CM | POA: Diagnosis present

## 2022-06-08 DIAGNOSIS — K5909 Other constipation: Secondary | ICD-10-CM | POA: Diagnosis present

## 2022-06-08 DIAGNOSIS — I2510 Atherosclerotic heart disease of native coronary artery without angina pectoris: Secondary | ICD-10-CM | POA: Diagnosis present

## 2022-06-08 DIAGNOSIS — I7143 Infrarenal abdominal aortic aneurysm, without rupture: Secondary | ICD-10-CM | POA: Diagnosis not present

## 2022-06-08 DIAGNOSIS — Z8249 Family history of ischemic heart disease and other diseases of the circulatory system: Secondary | ICD-10-CM

## 2022-06-08 DIAGNOSIS — R109 Unspecified abdominal pain: Secondary | ICD-10-CM | POA: Diagnosis present

## 2022-06-08 DIAGNOSIS — D649 Anemia, unspecified: Secondary | ICD-10-CM

## 2022-06-08 HISTORY — PX: CORONARY/GRAFT ACUTE MI REVASCULARIZATION: CATH118305

## 2022-06-08 HISTORY — PX: LEFT HEART CATH AND CORONARY ANGIOGRAPHY: CATH118249

## 2022-06-08 LAB — RESP PANEL BY RT-PCR (FLU A&B, COVID) ARPGX2
Influenza A by PCR: NEGATIVE
Influenza B by PCR: NEGATIVE
SARS Coronavirus 2 by RT PCR: NEGATIVE

## 2022-06-08 LAB — CBC
HCT: 35.5 % — ABNORMAL LOW (ref 39.0–52.0)
Hemoglobin: 11.3 g/dL — ABNORMAL LOW (ref 13.0–17.0)
MCH: 30.1 pg (ref 26.0–34.0)
MCHC: 31.8 g/dL (ref 30.0–36.0)
MCV: 94.4 fL (ref 80.0–100.0)
Platelets: 358 10*3/uL (ref 150–400)
RBC: 3.76 MIL/uL — ABNORMAL LOW (ref 4.22–5.81)
RDW: 16.6 % — ABNORMAL HIGH (ref 11.5–15.5)
WBC: 10.9 10*3/uL — ABNORMAL HIGH (ref 4.0–10.5)
nRBC: 0 % (ref 0.0–0.2)

## 2022-06-08 LAB — BASIC METABOLIC PANEL
Anion gap: 5 (ref 5–15)
BUN: 20 mg/dL (ref 8–23)
CO2: 24 mmol/L (ref 22–32)
Calcium: 8.9 mg/dL (ref 8.9–10.3)
Chloride: 107 mmol/L (ref 98–111)
Creatinine, Ser: 1.01 mg/dL (ref 0.61–1.24)
GFR, Estimated: >60 mL/min (ref >60)
Glucose, Bld: 115 mg/dL — ABNORMAL HIGH (ref 70–99)
Potassium: 4.1 mmol/L (ref 3.5–5.1)
Sodium: 136 mmol/L (ref 135–145)

## 2022-06-08 LAB — HEPATIC FUNCTION PANEL
ALT: 16 U/L (ref 0–44)
AST: 19 U/L (ref 15–41)
Albumin: 4 g/dL (ref 3.5–5.0)
Alkaline Phosphatase: 80 U/L (ref 38–126)
Bilirubin, Direct: <0.1 mg/dL (ref 0.0–0.2)
Total Bilirubin: 0.3 mg/dL (ref 0.3–1.2)
Total Protein: 7.4 g/dL (ref 6.5–8.1)

## 2022-06-08 LAB — TROPONIN I (HIGH SENSITIVITY)
Troponin I (High Sensitivity): 4 ng/L (ref <18)
Troponin I (High Sensitivity): 3 ng/L (ref <18)

## 2022-06-08 LAB — LIPID PANEL
Cholesterol: 163 mg/dL (ref 0–200)
HDL: 70 mg/dL (ref >40)
LDL Cholesterol: 83 mg/dL (ref 0–99)
Total CHOL/HDL Ratio: 2.3 RATIO
Triglycerides: 50 mg/dL (ref <150)
VLDL: 10 mg/dL (ref 0–40)

## 2022-06-08 LAB — HEMOGLOBIN A1C
Hgb A1c MFr Bld: 5.8 % — ABNORMAL HIGH (ref 4.8–5.6)
Mean Plasma Glucose: 119.76 mg/dL

## 2022-06-08 LAB — PROTIME-INR
INR: 1 (ref 0.8–1.2)
Prothrombin Time: 13.2 seconds (ref 11.4–15.2)

## 2022-06-08 LAB — MRSA NEXT GEN BY PCR, NASAL: MRSA by PCR Next Gen: NOT DETECTED

## 2022-06-08 LAB — LIPASE, BLOOD: Lipase: 25 U/L (ref 11–51)

## 2022-06-08 LAB — GLUCOSE, CAPILLARY: Glucose-Capillary: 106 mg/dL — ABNORMAL HIGH (ref 70–99)

## 2022-06-08 LAB — APTT: aPTT: 28 seconds (ref 24–36)

## 2022-06-08 SURGERY — CORONARY/GRAFT ACUTE MI REVASCULARIZATION
Anesthesia: Moderate Sedation

## 2022-06-08 MED ORDER — TIOTROPIUM BROMIDE MONOHYDRATE 18 MCG IN CAPS
1.0000 | ORAL_CAPSULE | Freq: Every day | RESPIRATORY_TRACT | Status: DC
  Administered 2022-06-08 – 2022-06-14 (×6): 18 ug via RESPIRATORY_TRACT
  Filled 2022-06-08 (×2): qty 5

## 2022-06-08 MED ORDER — SODIUM CHLORIDE 0.9 % IV SOLN: INTRAVENOUS | Status: DC

## 2022-06-08 MED ORDER — SODIUM CHLORIDE 0.9% FLUSH
3.0000 mL | Freq: Two times a day (BID) | INTRAVENOUS | Status: DC
  Administered 2022-06-08 – 2022-06-14 (×13): 3 mL via INTRAVENOUS

## 2022-06-08 MED ORDER — HYDROMORPHONE HCL 1 MG/ML IJ SOLN
0.5000 mg | INTRAMUSCULAR | Status: DC | PRN
  Administered 2022-06-08 – 2022-06-10 (×8): 1 mg via INTRAVENOUS
  Filled 2022-06-08 (×8): qty 1

## 2022-06-08 MED ORDER — LIDOCAINE HCL (PF) 1 % IJ SOLN
INTRAMUSCULAR | Status: DC | PRN
  Administered 2022-06-08: 2 mL

## 2022-06-08 MED ORDER — SODIUM CHLORIDE 0.9% FLUSH: 3.0000 mL | INTRAVENOUS | Status: DC | PRN

## 2022-06-08 MED ORDER — HEPARIN SODIUM (PORCINE) 1000 UNIT/ML IJ SOLN
INTRAMUSCULAR | Status: DC | PRN
  Administered 2022-06-08: 1000 [IU] via INTRAVENOUS

## 2022-06-08 MED ORDER — ORAL CARE MOUTH RINSE
15.0000 mL | OROMUCOSAL | Status: DC | PRN
Start: 2022-06-08 — End: 2022-06-14

## 2022-06-08 MED ORDER — BISACODYL 5 MG PO TBEC: 5.0000 mg | DELAYED_RELEASE_TABLET | Freq: Every day | ORAL | Status: DC | PRN

## 2022-06-08 MED ORDER — HEPARIN SODIUM (PORCINE) 5000 UNIT/ML IJ SOLN
60.0000 [IU]/kg | Freq: Once | INTRAMUSCULAR | Status: AC
  Administered 2022-06-08: 3000 [IU] via INTRAVENOUS

## 2022-06-08 MED ORDER — FENTANYL CITRATE (PF) 100 MCG/2ML IJ SOLN
INTRAMUSCULAR | Status: AC
  Filled 2022-06-08: qty 2

## 2022-06-08 MED ORDER — ENOXAPARIN SODIUM 40 MG/0.4ML IJ SOSY
40.0000 mg | PREFILLED_SYRINGE | INTRAMUSCULAR | Status: DC
  Administered 2022-06-08 – 2022-06-10 (×3): 40 mg via SUBCUTANEOUS
  Filled 2022-06-08 (×3): qty 0.4

## 2022-06-08 MED ORDER — ALBUTEROL SULFATE (2.5 MG/3ML) 0.083% IN NEBU: 3.0000 mL | INHALATION_SOLUTION | RESPIRATORY_TRACT | Status: DC | PRN

## 2022-06-08 MED ORDER — ASPIRIN 81 MG PO TBEC
81.0000 mg | DELAYED_RELEASE_TABLET | Freq: Every day | ORAL | Status: DC
Start: 2022-06-09 — End: 2022-06-11
  Administered 2022-06-09 – 2022-06-11 (×3): 81 mg via ORAL
  Filled 2022-06-08 (×3): qty 1

## 2022-06-08 MED ORDER — LIDOCAINE HCL 1 % IJ SOLN
INTRAMUSCULAR | Status: AC
  Filled 2022-06-08: qty 20

## 2022-06-08 MED ORDER — ATORVASTATIN CALCIUM 20 MG PO TABS
40.0000 mg | ORAL_TABLET | Freq: Every day | ORAL | Status: DC
  Administered 2022-06-08 – 2022-06-14 (×7): 40 mg via ORAL
  Filled 2022-06-08 (×7): qty 2

## 2022-06-08 MED ORDER — HEPARIN (PORCINE) IN NACL 1000-0.9 UT/500ML-% IV SOLN
INTRAVENOUS | Status: AC
  Filled 2022-06-08: qty 1000

## 2022-06-08 MED ORDER — SODIUM CHLORIDE 0.9 % IV SOLN: INTRAVENOUS | Status: AC

## 2022-06-08 MED ORDER — BUDESONIDE 0.25 MG/2ML IN SUSP
0.2500 mg | Freq: Two times a day (BID) | RESPIRATORY_TRACT | Status: DC
  Administered 2022-06-08 – 2022-06-14 (×11): 0.25 mg via RESPIRATORY_TRACT
  Filled 2022-06-08 (×11): qty 2

## 2022-06-08 MED ORDER — LACTATED RINGERS IV BOLUS
1000.0000 mL | Freq: Once | INTRAVENOUS | Status: AC
  Administered 2022-06-08: 1000 mL via INTRAVENOUS

## 2022-06-08 MED ORDER — POLYETHYLENE GLYCOL 3350 17 G PO PACK
17.0000 g | PACK | Freq: Every day | ORAL | Status: DC
  Administered 2022-06-08: 17 g via ORAL
  Filled 2022-06-08: qty 1

## 2022-06-08 MED ORDER — FENTANYL CITRATE PF 50 MCG/ML IJ SOSY
50.0000 ug | PREFILLED_SYRINGE | Freq: Once | INTRAMUSCULAR | Status: AC
  Administered 2022-06-08: 50 ug via INTRAVENOUS
  Filled 2022-06-08: qty 1

## 2022-06-08 MED ORDER — HEPARIN SODIUM (PORCINE) 1000 UNIT/ML IJ SOLN
INTRAMUSCULAR | Status: AC
  Filled 2022-06-08: qty 10

## 2022-06-08 MED ORDER — FENTANYL CITRATE (PF) 100 MCG/2ML IJ SOLN
INTRAMUSCULAR | Status: DC | PRN
  Administered 2022-06-08: 25 ug via INTRAVENOUS

## 2022-06-08 MED ORDER — CLOPIDOGREL BISULFATE 75 MG PO TABS
75.0000 mg | ORAL_TABLET | Freq: Every day | ORAL | Status: DC
  Administered 2022-06-09 – 2022-06-11 (×3): 75 mg via ORAL
  Filled 2022-06-08 (×3): qty 1

## 2022-06-08 MED ORDER — CHLORHEXIDINE GLUCONATE CLOTH 2 % EX PADS
6.0000 | MEDICATED_PAD | Freq: Every day | CUTANEOUS | Status: DC
  Administered 2022-06-08 – 2022-06-14 (×5): 6 via TOPICAL

## 2022-06-08 MED ORDER — ONDANSETRON HCL 4 MG/2ML IJ SOLN: 4.0000 mg | Freq: Four times a day (QID) | INTRAMUSCULAR | Status: DC | PRN

## 2022-06-08 MED ORDER — PANTOPRAZOLE SODIUM 40 MG PO TBEC
40.0000 mg | DELAYED_RELEASE_TABLET | Freq: Every day | ORAL | Status: DC
  Administered 2022-06-08 – 2022-06-14 (×7): 40 mg via ORAL
  Filled 2022-06-08 (×7): qty 1

## 2022-06-08 MED ORDER — VERAPAMIL HCL 2.5 MG/ML IV SOLN
INTRAVENOUS | Status: AC
  Filled 2022-06-08: qty 2

## 2022-06-08 MED ORDER — IOHEXOL 350 MG/ML SOLN
100.0000 mL | Freq: Once | INTRAVENOUS | Status: AC | PRN
  Administered 2022-06-08: 100 mL via INTRAVENOUS

## 2022-06-08 MED ORDER — MIDAZOLAM HCL 2 MG/2ML IJ SOLN
INTRAMUSCULAR | Status: AC
  Filled 2022-06-08: qty 2

## 2022-06-08 MED ORDER — ACETAMINOPHEN 325 MG PO TABS: 650.0000 mg | ORAL_TABLET | ORAL | Status: DC | PRN

## 2022-06-08 MED ORDER — VERAPAMIL HCL 2.5 MG/ML IV SOLN
INTRAVENOUS | Status: DC | PRN
  Administered 2022-06-08: 2.5 mg via INTRAVENOUS

## 2022-06-08 MED ORDER — FLUOXETINE HCL 20 MG PO CAPS
40.0000 mg | ORAL_CAPSULE | Freq: Every day | ORAL | Status: DC
  Administered 2022-06-08 – 2022-06-14 (×7): 40 mg via ORAL
  Filled 2022-06-08 (×7): qty 2

## 2022-06-08 MED ORDER — HEPARIN (PORCINE) IN NACL 1000-0.9 UT/500ML-% IV SOLN
INTRAVENOUS | Status: DC | PRN
  Administered 2022-06-08 (×2): 500 mL

## 2022-06-08 MED ORDER — SODIUM CHLORIDE 0.9 % IV SOLN: 250.0000 mL | INTRAVENOUS | Status: DC | PRN

## 2022-06-08 SURGICAL SUPPLY — 16 items
BAND CMPR LRG ZPHR (HEMOSTASIS) ×1
BAND ZEPHYR COMPRESS 30 LONG (HEMOSTASIS) ×1 IMPLANT
CATH 5FR JL3.5 JR4 ANG PIG MP (CATHETERS) ×1 IMPLANT
CATH LAUNCHER 6FR AL.75 (CATHETERS) IMPLANT
CATH LAUNCHER 6FR JR4 (CATHETERS) ×1 IMPLANT
DRAPE BRACHIAL (DRAPES) ×1 IMPLANT
GLIDESHEATH SLEND SS 6F .021 (SHEATH) ×1 IMPLANT
GUIDEWIRE INQWIRE 1.5J.035X260 (WIRE) IMPLANT
INQWIRE 1.5J .035X260CM (WIRE) ×4
KIT ENCORE 26 ADVANTAGE (KITS) IMPLANT
PACK CARDIAC CATH (CUSTOM PROCEDURE TRAY) ×3 IMPLANT
PROTECTION STATION PRESSURIZED (MISCELLANEOUS) ×2
SET ATX SIMPLICITY (MISCELLANEOUS) ×1 IMPLANT
STATION PROTECTION PRESSURIZED (MISCELLANEOUS) IMPLANT
TUBING CIL FLEX 10 FLL-RA (TUBING) ×1 IMPLANT
VALVE COPILOT STAT (MISCELLANEOUS) ×1 IMPLANT

## 2022-06-08 NOTE — ED Notes (Signed)
To cath lab.

## 2022-06-08 NOTE — Progress Notes (Signed)
CTA result reviewed, CTA part showed no acute event intra-abdomen and pelvis, and no other acute finding to explain patient's symptoms. Recommend symptomatic management for now and outpatient EGD if symptoms persists.

## 2022-06-08 NOTE — ED Notes (Signed)
Pt also complains of severe HA since last night. States whole head hurts. Describes HA as sharp, 10/10. Pt has no focal neuro deficits at this time, is alert oriented, PERRL.

## 2022-06-08 NOTE — ED Notes (Signed)
Cardiologist at bedside.  EDP again at bedside. Pt describes that chest pain started last night. Builateral radial pulses are +2. Pt grimacing, appears pale. Pt not diaphoretic. Pt states has hx stents placed.

## 2022-06-08 NOTE — ED Triage Notes (Signed)
Per night RN report, pt brought by EMS for CP, 324mg  ASA was taken by pt at home before called EMS. EMS was unable to get IV access, pt now has 2 IV lines, pt continues to complain of mid sternal chest pain that radiates through to back, describes as sharp, waiting on cardiology, code STEMI has been called.

## 2022-06-08 NOTE — ED Provider Notes (Addendum)
[facility] Provider Note    Event Date/Time   First MD Initiated Contact with Patient 06/08/22 0701     (approximate)   History   Chest Pain   HPI  Logan Vazquez is a 76 y.o. male with Paschal history of arthritis, COPD, ongoing tobacco abuse, HTN, anemia, GERD, AAA s/p EVAR, as well as known CAD who presents for evaluation of chest pain.  He states started last night.  He states he initially thought it was related to constipation as he does not think he has had a bowel movement in almost a week.  He states his pain is currently 10/10 in intensity.  He describes it as substernal and also in the epigastric region.  He does have some mild epigastric tenderness.  He denies any cough, fever, shortness of breath, nausea, vomiting, urinary symptoms rash or extremity pain.  States he has a mild headache as well and some chronic back pain that is not any different than usual.  He denies any recent EtOH use or illicit drug use.  He states he took 4 baby aspirin's this morning prior to arrival.    Past Medical History:  Diagnosis Date   Arthritis    Asthma    CAD (coronary artery disease)    s/p 2 stents to RCA in 07/1999 by Dr. Juliann Pares   Depression    Emphysema lung (HCC)    02/03/18; pt states he does not have COPD   Glaucoma    Followed by Optho (Dr. Neale Burly)   Hypertension      Physical Exam  Triage Vital Signs: ED Triage Vitals  Enc Vitals Group     BP 06/08/22 0652 (!) 109/57     Pulse Rate 06/08/22 0652 (!) 49     Resp 06/08/22 0652 17     Temp 06/08/22 0652 97.6 F (36.4 C)     Temp Source 06/08/22 0652 Oral     SpO2 06/08/22 0652 98 %     Weight 06/08/22 0650 110 lb (49.9 kg)     Height 06/08/22 0650 5\' 9"  (1.753 m)     Head Circumference --      Peak Flow --      Pain Score 06/08/22 0650 10     Pain Loc --      Pain Edu? --      Excl. in GC? --     Most recent vital signs: Vitals:   06/08/22 0735 06/08/22 0752  BP:  (!) 115/59   Pulse: (!) 50   Resp: 14 12  Temp:    SpO2: 98% 98%    General: Awake, no distress.  CV:  Good peripheral perfusion.  2+ radial pulses.  Bradycardic. Resp:  Normal effort.  Clear bilaterally. Abd:  No distention.  Soft.  Mild epigastric tenderness. Other:     ED Results / Procedures / Treatments  Labs (all labs ordered are listed, but only abnormal results are displayed) Labs Reviewed  BASIC METABOLIC PANEL - Abnormal; Notable for the following components:      Result Value   Glucose, Bld 115 (*)    All other components within normal limits  CBC - Abnormal; Notable for the following components:   WBC 10.9 (*)    RBC 3.76 (*)    Hemoglobin 11.3 (*)    HCT 35.5 (*)    RDW 16.6 (*)    All other components within normal limits  RESP PANEL BY RT-PCR (FLU A&B, COVID)  ARPGX2  LIPASE, BLOOD  HEPATIC FUNCTION PANEL  PROTIME-INR  APTT  HEMOGLOBIN A1C  LIPID PANEL  TROPONIN I (HIGH SENSITIVITY)     EKG  Initial ECG shows sinus bradycardia with a ventricular rate of 50 and some nonspecific somewhat diagnostic subtle elevations in the inferior leads as well as what appears to be new T wave inversion and slight ST depression in V2 with left bundle branch block.  Repeat EKG obtained at 7:18 AM shows what appears to be ST elevations in inferior leads and similar T wave inversion in V2 but this is more diagnostic and is concerning for a STEMI.   RADIOLOGY  Chest x-ray on my interpretation without evidence of pneumothorax, focal consolidation but similar-appearing right perihilar fullness and elevation of left hemidiaphragm compared to x-ray from 3/31 without other clear acute process.  I also reviewed radiologist interpretation.   PROCEDURES:  Critical Care performed: Yes, see critical care procedure note(s)  .Critical Care  Performed by: Gilles Chiquito, MD Authorized by: Gilles Chiquito, MD   Critical care provider statement:    Critical care time (minutes):  30    Critical care was necessary to treat or prevent imminent or life-threatening deterioration of the following conditions:  Cardiac failure   Critical care was time spent personally by me on the following activities:  Development of treatment plan with patient or surrogate, discussions with consultants, evaluation of patient's response to treatment, examination of patient, ordering and review of laboratory studies, ordering and review of radiographic studies, ordering and performing treatments and interventions, pulse oximetry, re-evaluation of patient's condition and review of old charts     MEDICATIONS ORDERED IN ED: Medications  0.9 %  sodium chloride infusion (has no administration in time range)  lactated ringers bolus 1,000 mL (1,000 mLs Intravenous New Bag/Given 06/08/22 0716)  fentaNYL (SUBLIMAZE) injection 50 mcg (50 mcg Intravenous Given 06/08/22 0718)  heparin injection 3,000 Units (3,000 Units Intravenous Given 06/08/22 0727)     IMPRESSION / MDM / ASSESSMENT AND PLAN / ED COURSE  I reviewed the triage vital signs and the nursing notes. Patient's presentation is most consistent with acute presentation with potential threat to life or bodily function.                               Differential diagnosis includes, but is not limited to ACS, dissection, pneumonia, gastritis, esophageal spasm, peptic ulcer disease, complication from recent AAA repair and possible symptomatic constipation or SBO.  Initial ECG is somewhat diagnostic I immediately reached out to interventional cardiology Dr. Beatrix Fetters given concerning elevations in inferior leads.  He recommended repeat and repeat ECG is more definitive for STEMI which was immediately called after discussion with Dr Beatrix Fetters.  Initial trop not elevated  Patient has already received appropriate dose of ASA.  Heparin ordered.  Chest x-ray on my interpretation without evidence of pneumothorax, focal consolidation but similar-appearing right perihilar  fullness and elevation of left hemidiaphragm compared to x-ray from 3/31 without other clear acute process.  I also reviewed radiologist interpretation.  CBC with WBC count of 10.9, hemoglobin of 11.3 and normal platelets.  BMP shows no significant electrolyte or metabolic derangements.  Hepatic function panel shows no evidence of hepatitis or cholestatic process.  INR is 1.  PTT is 28.      FINAL CLINICAL IMPRESSION(S) / ED DIAGNOSES   Final diagnoses:  ST elevation myocardial infarction (STEMI), unspecified artery (HCC)  Chest pain, unspecified type     Rx / DC Orders   ED Discharge Orders     None        Note:  This document was prepared using Dragon voice recognition software and may include unintentional dictation errors.   Gilles Chiquito, MD 06/08/22 0801    Gilles Chiquito, MD 06/08/22 [phone]

## 2022-06-09 DIAGNOSIS — G4700 Insomnia, unspecified: Secondary | ICD-10-CM

## 2022-06-09 DIAGNOSIS — I7143 Infrarenal abdominal aortic aneurysm, without rupture: Secondary | ICD-10-CM | POA: Diagnosis not present

## 2022-06-09 DIAGNOSIS — R109 Unspecified abdominal pain: Secondary | ICD-10-CM | POA: Diagnosis not present

## 2022-06-09 DIAGNOSIS — R072 Precordial pain: Secondary | ICD-10-CM | POA: Diagnosis not present

## 2022-06-09 DIAGNOSIS — K5900 Constipation, unspecified: Secondary | ICD-10-CM

## 2022-06-09 DIAGNOSIS — R338 Other retention of urine: Secondary | ICD-10-CM | POA: Diagnosis not present

## 2022-06-09 DIAGNOSIS — R531 Weakness: Secondary | ICD-10-CM

## 2022-06-09 LAB — BASIC METABOLIC PANEL
Anion gap: 8 (ref 5–15)
BUN: 20 mg/dL (ref 8–23)
CO2: 21 mmol/L — ABNORMAL LOW (ref 22–32)
Calcium: 8.6 mg/dL — ABNORMAL LOW (ref 8.9–10.3)
Chloride: 106 mmol/L (ref 98–111)
Creatinine, Ser: 0.89 mg/dL (ref 0.61–1.24)
GFR, Estimated: >60 mL/min (ref >60)
Glucose, Bld: 96 mg/dL (ref 70–99)
Potassium: 4.5 mmol/L (ref 3.5–5.1)
Sodium: 135 mmol/L (ref 135–145)

## 2022-06-09 LAB — CBC
HCT: 33 % — ABNORMAL LOW (ref 39.0–52.0)
Hemoglobin: 10.5 g/dL — ABNORMAL LOW (ref 13.0–17.0)
MCH: 30.3 pg (ref 26.0–34.0)
MCHC: 31.8 g/dL (ref 30.0–36.0)
MCV: 95.1 fL (ref 80.0–100.0)
Platelets: 325 10*3/uL (ref 150–400)
RBC: 3.47 MIL/uL — ABNORMAL LOW (ref 4.22–5.81)
RDW: 16.8 % — ABNORMAL HIGH (ref 11.5–15.5)
WBC: 10.9 10*3/uL — ABNORMAL HIGH (ref 4.0–10.5)
nRBC: 0 % (ref 0.0–0.2)

## 2022-06-09 MED ORDER — TRAZODONE HCL 50 MG PO TABS
50.0000 mg | ORAL_TABLET | Freq: Every day | ORAL | Status: DC
  Administered 2022-06-09 – 2022-06-13 (×5): 50 mg via ORAL
  Filled 2022-06-09 (×5): qty 1

## 2022-06-09 MED ORDER — POLYETHYLENE GLYCOL 3350 17 G PO PACK
17.0000 g | PACK | Freq: Every day | ORAL | Status: DC
  Administered 2022-06-10 – 2022-06-12 (×2): 17 g via ORAL
  Filled 2022-06-09 (×5): qty 1

## 2022-06-09 MED ORDER — LACTULOSE 10 GM/15ML PO SOLN
30.0000 g | Freq: Two times a day (BID) | ORAL | Status: DC
  Administered 2022-06-09: 30 g via ORAL
  Filled 2022-06-09: qty 60

## 2022-06-09 MED ORDER — TAMSULOSIN HCL 0.4 MG PO CAPS
0.4000 mg | ORAL_CAPSULE | Freq: Every day | ORAL | Status: DC
  Administered 2022-06-09 – 2022-06-14 (×6): 0.4 mg via ORAL
  Filled 2022-06-09 (×6): qty 1

## 2022-06-09 MED ORDER — POLYETHYLENE GLYCOL 3350 17 G PO PACK
17.0000 g | PACK | Freq: Two times a day (BID) | ORAL | Status: DC
  Administered 2022-06-09: 17 g via ORAL
  Filled 2022-06-09: qty 1

## 2022-06-09 NOTE — Progress Notes (Signed)
Report called to nurse receiving patient into room 110.

## 2022-06-09 NOTE — Assessment & Plan Note (Addendum)
Foley catheter in place for greater than 800 mL in the bladder.  Continue Flomax.  Leave Foley catheter in for 1 week and do voiding trial.

## 2022-06-09 NOTE — Progress Notes (Signed)
Patient attempted to get up from bed and sit in chair, I redirected patient back to bed and asked why he wanted to sit in the chair. He stated he wanted his clothes and personal belongings. I provided his belongings from the closet and assisted him with putting his clothes on. I then attempted to assist patient back to bed, he stated he just wanted to sit on the edge of the bed, that laying in bed makes him uncomfortable. I assisted him to the edge of the bed and set the bed exit alarm appropriately.

## 2022-06-09 NOTE — Evaluation (Addendum)
Physical Therapy Evaluation Patient Details Name: Logan Vazquez MRN: [id number] DOB: 1946/03/10 Today's Date: 06/09/2022  History of Present Illness  Pt is a 76 y/o M admitted on 06/08/22 after presenting with c/o sudden onset epigastric pain & chest pain. Pt admitted for work-up. Pt underwent cardiac cath on 06/08/22. PMH: CAD with remote PCI, AAA s/p EVAR, chronic iron deficiency anemia, mild intermittent asthma, COPD, HTN, chronic back pain on narcotic dependence, glaucoma  Clinical Impression  Pt seen for PT evaluation with pt agreeable but irritable throughout session. Pt reports he lives in a double wide with 4 steps with R rail to enter, is ambulatory with a RW, denies falls, & sister assists with transportation. Pt endorses 10/10 throbbing chest pain but behaviors during session do not coincide with this rating. Pt irritable throughout session but is able to complete bed mobility with mod I & ambulate into hallway & back with RW & supervision but pt pushing RW out in front of him. Anticipate pt is not far from his baseline but do have concerns re: pt d/c home alone. Would recommend STR upon d/c to maximize independence with mobility & safety with functional tasks, but unsure if pt will be agreeable to this.    Pt received & on room air throughout session, SPO2 >90% throughout when pleth waveform was accurate.   Recommendations for follow up therapy are one component of a multi-disciplinary discharge planning process, led by the attending physician.  Recommendations may be updated based on patient status, additional functional criteria and insurance authorization.  Follow Up Recommendations Skilled nursing-short term rehab (<3 hours/day) Can patient physically be transported by private vehicle: Yes    Assistance Recommended at Discharge Intermittent Supervision/Assistance  Patient can return home with the following  Assistance with cooking/housework;Help with stairs or ramp for  entrance;Assist for transportation    Equipment Recommendations None recommended by PT  Recommendations for Other Services       Functional Status Assessment Patient has had a recent decline in their functional status and demonstrates the ability to make significant improvements in function in a reasonable and predictable amount of time.     Precautions / Restrictions Precautions Precautions: Fall Restrictions Weight Bearing Restrictions: No      Mobility  Bed Mobility Overal bed mobility: Modified Independent             General bed mobility comments: supine<>sit with HOB elevated, bed rails, extra time to elevate BLE onto bed with sit>supine    Transfers Overall transfer level: Modified independent   Transfers: Sit to/from Stand Sit to Stand: Modified independent (Device/Increase time)           General transfer comment: STS from EOB with mod I but poor hand placement when transferring with RW    Ambulation/Gait Ambulation/Gait assistance: Supervision Gait Distance (Feet): 25 Feet Assistive device: Rolling walker (2 wheels) Gait Pattern/deviations: Decreased stride length Gait velocity: decreased     General Gait Details: Pushes RW significantly out in front of him  Stairs            Wheelchair Mobility    Modified Rankin (Stroke Patients Only)       Balance Overall balance assessment: Needs assistance Sitting-balance support: Feet supported Sitting balance-Leahy Scale: Good Sitting balance - Comments: supervision sitting EOB   Standing balance support: During functional activity, Bilateral upper extremity supported Standing balance-Leahy Scale: Fair Standing balance comment: BUE on RW  Pertinent Vitals/Pain Pain Assessment Pain Assessment: 0-10 Pain Score: 10-Worst pain ever Pain Location: chest Pain Descriptors / Indicators: Throbbing Pain Intervention(s): Monitored during session    Home  Living Family/patient expects to be discharged to:: Private residence Living Arrangements: Alone Available Help at Discharge: Family;Available PRN/intermittently Type of Home: Mobile home ("double wide") Home Access: Stairs to enter Entrance Stairs-Rails: Right Entrance Stairs-Number of Steps: 4   Home Layout: One level Home Equipment: Agricultural consultant (2 wheels)      Prior Function               Mobility Comments: Pt reports he's ambulatory with RW, denies falls ADLs Comments: Sister assists with transportation when she's able.     Hand Dominance        Extremity/Trunk Assessment   Upper Extremity Assessment Upper Extremity Assessment: Overall WFL for tasks assessed    Lower Extremity Assessment Lower Extremity Assessment: Generalized weakness    Cervical / Trunk Assessment Cervical / Trunk Assessment: Kyphotic  Communication   Communication: No difficulties  Cognition Arousal/Alertness: Awake/alert Behavior During Therapy: Agitated Overall Cognitive Status: No family/caregiver present to determine baseline cognitive functioning                                 General Comments: Pt oriented to self, birthday, current year, & situation, but otherwise reports he's in a hospital in [place].        General Comments General comments (skin integrity, edema, etc.): Pt received & on room air throughout session, SPO2 >90% throughout when pleth waveform was accurate.    Exercises     Assessment/Plan    PT Assessment Patient needs continued PT services  PT Problem List Decreased strength;Pain;Decreased activity tolerance;Decreased balance;Decreased safety awareness;Decreased knowledge of use of DME;Decreased cognition       PT Treatment Interventions DME instruction;Therapeutic exercise;Stair training;Neuromuscular re-education;Gait training;Balance training;Functional mobility training;Therapeutic activities;Patient/family education;Cognitive  remediation    PT Goals (Current goals can be found in the Care Plan section)  Acute Rehab PT Goals Patient Stated Goal: none stated PT Goal Formulation: With patient Time For Goal Achievement: 06/23/22 Potential to Achieve Goals: Good    Frequency Min 2X/week     Co-evaluation               AM-PAC PT "6 Clicks" Mobility  Outcome Measure Help needed turning from your back to your side while in a flat bed without using bedrails?: None Help needed moving from lying on your back to sitting on the side of a flat bed without using bedrails?: None Help needed moving to and from a bed to a chair (including a wheelchair)?: None Help needed standing up from a chair using your arms (e.g., wheelchair or bedside chair)?: None Help needed to walk in hospital room?: A Little Help needed climbing 3-5 steps with a railing? : A Little 6 Click Score: 22    End of Session   Activity Tolerance: Treatment limited secondary to agitation Patient left: in bed;with bed alarm set;with call bell/phone within reach   PT Visit Diagnosis: Unsteadiness on feet (R26.81);Muscle weakness (generalized) (M62.81)    Time: 8657-8469 PT Time Calculation (min) (ACUTE ONLY): 10 min   Charges:   PT Evaluation $PT Eval Moderate Complexity: 1 Mod          Aleda Grana, PT, DPT 06/09/22, 9:58 AM   Sandi Mariscal 06/09/2022, 9:56 AM

## 2022-06-09 NOTE — Assessment & Plan Note (Addendum)
 Continue MiraLAX daily

## 2022-06-09 NOTE — Assessment & Plan Note (Signed)
Physical therapy recommending rehab 

## 2022-06-09 NOTE — Assessment & Plan Note (Signed)
CT scan does not show any abdominal aortic aneurysm leak.

## 2022-06-09 NOTE — Progress Notes (Signed)
Will not call for help and refuses to stay in bed, states that he sits on couch at home all night and may sleep 15 minutes at a time. Bed alarm is on and has been all night. I have frequently asked patient to please call me to assist him with standing, he stated he will not call for help, because he does not want to bother anyone. I assured him that it was no bother, that I did not want him to fall or injure himself. He stated he appreciates that, but will continue to get up without assistance. Bed alarm on and closely monitored.

## 2022-06-10 ENCOUNTER — Inpatient Hospital Stay: Payer: Medicare HMO

## 2022-06-10 ENCOUNTER — Encounter: Payer: Self-pay | Admitting: Cardiology

## 2022-06-10 DIAGNOSIS — R413 Other amnesia: Secondary | ICD-10-CM

## 2022-06-10 DIAGNOSIS — R109 Unspecified abdominal pain: Secondary | ICD-10-CM | POA: Diagnosis not present

## 2022-06-10 DIAGNOSIS — I7143 Infrarenal abdominal aortic aneurysm, without rupture: Secondary | ICD-10-CM | POA: Diagnosis not present

## 2022-06-10 DIAGNOSIS — R338 Other retention of urine: Secondary | ICD-10-CM | POA: Diagnosis not present

## 2022-06-10 DIAGNOSIS — K5900 Constipation, unspecified: Secondary | ICD-10-CM | POA: Diagnosis not present

## 2022-06-10 DIAGNOSIS — J181 Lobar pneumonia, unspecified organism: Secondary | ICD-10-CM

## 2022-06-10 LAB — BASIC METABOLIC PANEL
Anion gap: 4 — ABNORMAL LOW (ref 5–15)
BUN: 12 mg/dL (ref 8–23)
CO2: 23 mmol/L (ref 22–32)
Calcium: 8.2 mg/dL — ABNORMAL LOW (ref 8.9–10.3)
Chloride: 108 mmol/L (ref 98–111)
Creatinine, Ser: 0.78 mg/dL (ref 0.61–1.24)
GFR, Estimated: >60 mL/min (ref >60)
Glucose, Bld: 133 mg/dL — ABNORMAL HIGH (ref 70–99)
Potassium: 3.6 mmol/L (ref 3.5–5.1)
Sodium: 135 mmol/L (ref 135–145)

## 2022-06-10 LAB — CBC
HCT: 26.8 % — ABNORMAL LOW (ref 39.0–52.0)
Hemoglobin: 8.9 g/dL — ABNORMAL LOW (ref 13.0–17.0)
MCH: 30.5 pg (ref 26.0–34.0)
MCHC: 33.2 g/dL (ref 30.0–36.0)
MCV: 91.8 fL (ref 80.0–100.0)
Platelets: 237 10*3/uL (ref 150–400)
RBC: 2.92 MIL/uL — ABNORMAL LOW (ref 4.22–5.81)
RDW: 16.3 % — ABNORMAL HIGH (ref 11.5–15.5)
WBC: 6.7 10*3/uL (ref 4.0–10.5)
nRBC: 0 % (ref 0.0–0.2)

## 2022-06-10 LAB — LIPOPROTEIN A (LPA): Lipoprotein (a): 131.3 nmol/L — ABNORMAL HIGH (ref <75.0)

## 2022-06-10 MED ORDER — AZITHROMYCIN 500 MG PO TABS
500.0000 mg | ORAL_TABLET | Freq: Every day | ORAL | Status: AC
  Administered 2022-06-10: 500 mg via ORAL
  Filled 2022-06-10: qty 1

## 2022-06-10 MED ORDER — ALBUTEROL SULFATE (2.5 MG/3ML) 0.083% IN NEBU
2.5000 mg | INHALATION_SOLUTION | Freq: Four times a day (QID) | RESPIRATORY_TRACT | Status: DC
  Administered 2022-06-10 (×2): 2.5 mg via RESPIRATORY_TRACT
  Filled 2022-06-10 (×2): qty 3

## 2022-06-10 MED ORDER — METOPROLOL TARTRATE 25 MG PO TABS
12.5000 mg | ORAL_TABLET | Freq: Once | ORAL | Status: AC
  Administered 2022-06-10: 12.5 mg via ORAL
  Filled 2022-06-10: qty 1

## 2022-06-10 MED ORDER — ACETAMINOPHEN 325 MG PO TABS: 650.0000 mg | ORAL_TABLET | ORAL | Status: DC | PRN

## 2022-06-10 MED ORDER — IOHEXOL 300 MG/ML  SOLN
INTRAMUSCULAR | Status: DC | PRN
  Administered 2022-06-08: 98 mL

## 2022-06-10 MED ORDER — OXYCODONE HCL 5 MG PO TABS
2.5000 mg | ORAL_TABLET | Freq: Four times a day (QID) | ORAL | Status: DC | PRN
  Administered 2022-06-10 – 2022-06-12 (×5): 2.5 mg via ORAL
  Filled 2022-06-10 (×5): qty 1

## 2022-06-10 MED ORDER — SODIUM CHLORIDE 0.9 % IV SOLN
2.0000 g | INTRAVENOUS | Status: DC
  Administered 2022-06-10 – 2022-06-14 (×5): 2 g via INTRAVENOUS
  Filled 2022-06-10 (×5): qty 2

## 2022-06-10 MED ORDER — AZITHROMYCIN 500 MG PO TABS
250.0000 mg | ORAL_TABLET | Freq: Every day | ORAL | Status: AC
  Administered 2022-06-11 – 2022-06-14 (×4): 250 mg via ORAL
  Filled 2022-06-10 (×4): qty 1

## 2022-06-10 MED ORDER — BUSPIRONE HCL 10 MG PO TABS
15.0000 mg | ORAL_TABLET | Freq: Two times a day (BID) | ORAL | Status: DC
  Administered 2022-06-10 – 2022-06-14 (×8): 15 mg via ORAL
  Filled 2022-06-10 (×8): qty 2

## 2022-06-10 MED ORDER — IPRATROPIUM-ALBUTEROL 0.5-2.5 (3) MG/3ML IN SOLN: 3.0000 mL | Freq: Four times a day (QID) | RESPIRATORY_TRACT | Status: DC

## 2022-06-10 MED ORDER — POLYVINYL ALCOHOL 1.4 % OP SOLN: 1.0000 [drp] | OPHTHALMIC | Status: DC | PRN

## 2022-06-10 NOTE — Progress Notes (Signed)
Progress Note  Patient Name: Logan Vazquez Date of Encounter: 06/10/2022  [facility] Cardiologist: Dr. Okey Dupre  Subjective   Patient seen on a.m. rounds.  Currently denies chest discomfort continues to have some occasional abdominal discomfort but denies any shortness of breath.  Inpatient Medications    Scheduled Meds:  albuterol  2.5 mg Nebulization QID   aspirin EC  81 mg Oral Daily   atorvastatin  40 mg Oral Daily   [START ON 06/11/2022] azithromycin  250 mg Oral Daily   budesonide  0.25 mg Nebulization BID   busPIRone  15 mg Oral BID   Chlorhexidine Gluconate Cloth  6 each Topical Q0600   clopidogrel  75 mg Oral Daily   enoxaparin (LOVENOX) injection  40 mg Subcutaneous Q24H   FLUoxetine  40 mg Oral Daily   pantoprazole  40 mg Oral Daily   polyethylene glycol  17 g Oral Daily   sodium chloride flush  3 mL Intravenous Q12H   tamsulosin  0.4 mg Oral QPC breakfast   tiotropium  1 capsule Inhalation Daily   traZODone  50 mg Oral QHS   Continuous Infusions:  sodium chloride     cefTRIAXone (ROCEPHIN)  IV 2 g (06/10/22 1217)   PRN Meds: sodium chloride, acetaminophen, bisacodyl, iohexol, ondansetron (ZOFRAN) IV, mouth rinse, oxyCODONE, polyvinyl alcohol, sodium chloride flush   Vital Signs    Vitals:   06/09/22 1955 06/09/22 2009 06/10/22 0410 06/10/22 0816  BP:  (!) 113/55 (!) 111/53 100/65  Pulse: 77 80 70 73  Resp: 14 18 19 18   Temp:  99 F (37.2 C) 97.9 F (36.6 C) 98.1 F (36.7 C)  TempSrc:   Oral Oral  SpO2: 95% 96% 96% 96%  Weight:      Height:        Intake/Output Summary (Last 24 hours) at 06/10/2022 1613 Last data filed at 06/10/2022 1018 Gross per 24 hour  Intake 240 ml  Output 2200 ml  Net -1960 ml      06/08/2022    9:00 AM 06/08/2022    6:50 AM 04/26/2022    1:45 PM  Last 3 Weights  Weight (lbs) 121 lb 7.6 oz 110 lb 118 lb 6.4 oz  Weight (kg) 55.1 kg 49.896 kg 53.706 kg      Telemetry    Not currently on telemetry- Personally  Reviewed  ECG    No new tracings- Personally Reviewed  Physical Exam   GEN: No acute distress.   Neck: No JVD Cardiac: RRR, no murmurs, rubs, or gallops.  Respiratory: Clear to auscultation bilaterally. GI: Soft, mildly tender to palpation, non-distended  MS: No edema; No deformity.  Right radial cath site clean, dry, intact.  Small amount of bruising noted without hematoma.  Radial pulse 2+ Neuro:  Nonfocal  Psych: Normal affect   Labs    High Sensitivity Troponin:   Recent Labs  Lab 06/08/22 0653 06/08/22 0944  TROPONINIHS 4 3     Chemistry Recent Labs  Lab 06/08/22 0653 06/09/22 0415 06/10/22 0235  NA 136 135 135  K 4.1 4.5 3.6  CL 107 106 108  CO2 24 21* 23  GLUCOSE 115* 96 133*  BUN 20 20 12   CREATININE 1.01 0.89 0.78  CALCIUM 8.9 8.6* 8.2*  PROT 7.4  --   --   ALBUMIN 4.0  --   --   AST 19  --   --   ALT 16  --   --   ALKPHOS  80  --   --   BILITOT 0.3  --   --   GFRNONAA >60 >60 >60  ANIONGAP 5 8 4*    Lipids  Recent Labs  Lab 06/08/22 0653  CHOL 163  TRIG 50  HDL 70  LDLCALC 83  CHOLHDL 2.3    Hematology Recent Labs  Lab 06/08/22 0653 06/09/22 0415 06/10/22 0235  WBC 10.9* 10.9* 6.7  RBC 3.76* 3.47* 2.92*  HGB 11.3* 10.5* 8.9*  HCT 35.5* 33.0* 26.8*  MCV 94.4 95.1 91.8  MCH 30.1 30.3 30.5  MCHC 31.8 31.8 33.2  RDW 16.6* 16.8* 16.3*  PLT 358 325 237   Thyroid No results for input(s): "TSH", "FREET4" in the last 168 hours.  BNPNo results for input(s): "BNP", "PROBNP" in the last 168 hours.  DDimer No results for input(s): "DDIMER" in the last 168 hours.   Radiology    DG Chest 2 View  Result Date: 06/10/2022 CLINICAL DATA:  Inpatient encounter for cough. EXAM: CHEST - 2 VIEW COMPARISON:  Radiographs 06/08/2022 and 03/15/2022. CT 02/18/2019 and 06/08/2022. FINDINGS: The lateral view was repeated. There is chronic elevation of the left hemidiaphragm. The heart size and mediastinal contours are stable with aortic atherosclerosis.  Interval increased retrocardiac left lower lobe airspace disease without significant pleural effusion. The right lung appears clear. There is no pneumothorax. Underlying emphysematous changes are noted. Chronic lower thoracic compression deformity appears unchanged. IMPRESSION: Increased left lower lobe airspace disease which may reflect a developing infiltrate or progressive atelectasis. Recommend radiographic follow-up. Electronically Signed   By: Carey Bullocks M.D.   On: 06/10/2022 10:19    Cardiac Studies  LHC 06/08/2022   Prox RCA lesion is 20% stenosed.   Ost LM lesion is 50% stenosed.   Mid LAD lesion is 50% stenosed.   The left ventricular systolic function is normal.   LV end diastolic pressure is moderately elevated.   The left ventricular ejection fraction is 55-65% by visual estimate.   There is no aortic valve stenosis.   There is no mitral valve regurgitation.   Conclusion: Mild to moderate diffuse coronary disease including 50% ostial LMCA stenosis, 50% mid LAD stenosis, and 20% in-stent restenosis of proximal RCA stent. No obstructive lesions discovered to explain the patient's severe chest and abdominal pain. Elevated LVEDP at 27 mmHg Normal LV systolic function estimated at 55 to 60%  Myoview completed 03/20/2022 There is no evidence for ischemia. The study is low risk.   No ST deviation was noted.   LV perfusion is normal.   Left ventricular function is normal. calculated LVEF is 50%   Three vessel coronary artery calcification noted.   Echocardiogram completed 03/15/2022 1. Left ventricular ejection fraction, by estimation, is 65 to 70%. The  left ventricle has normal function. The left ventricle has no regional  wall motion abnormalities. Left ventricular diastolic parameters are  consistent with Grade I diastolic  dysfunction (impaired relaxation).   2. Right ventricular systolic function is normal. The right ventricular  size is normal.   3. The mitral valve is  normal in structure. Mild mitral valve  regurgitation. No evidence of mitral stenosis.   4. The aortic valve is normal in structure. Aortic valve regurgitation is  moderate. No aortic stenosis is present.   5. The inferior vena cava is normal in size with greater than 50%  respiratory variability, suggesting right atrial pressure of 3 mmHg.   Patient Profile     76 y.o. male with a history  of CAD with remote PCI, AAA status post EVAR, anemia, GERD, tobacco use, COPD who is evaluated for chest discomfort and underwent left heart catheterization.  Assessment & Plan    Chest pain and abdominal pain -High-sensitivity troponin was 4 -Emergent cath showed mild to moderate diffuse CAD with 20% in-stent restenosis of the proximal RCA stent, no obstructive lesion, elevated LV EF P 27 mmHg, normal LVSF 55 to 60% -Likely noncardiac chest pain -CT negative for aortic dissection or leak from previous graft -Continue aspirin 81 mg daily indefinitely -Continue Plavix for recent AAA repair -Abdominal pain likely caused by acute urinary retention  2.  Essential hypertension -Continue amlodipine -Vital signs per unit protocol  3.  Hyperlipidemia -Continue atorvastatin -LDL 52 04/17/2021   For questions or updates, please contact [facility] Please consult [email or web address] for contact info under        Signed, Melvyn Hommes, NP  06/10/2022, 4:13 PM

## 2022-06-10 NOTE — Progress Notes (Signed)
PT Cancellation Note  Patient Details Name: Logan Vazquez MRN: [id number] DOB: May 04, 1946   Cancelled Treatment:    Reason Eval/Treat Not Completed: Patient declined, no reason specified Pt received in bed & declines participation in PT despite multiple attempts at encouragement for OOB mobility (walk, get to recliner, go to bathroom). Pt reports he has a rod in his LLE that needs to be removed & doesn't recall working with PT yesterday. Pt continues to decline. Notified nurse.  Aleda Grana, PT, DPT 06/10/22, 10:57 AM  Sandi Mariscal 06/10/2022, 10:55 AM

## 2022-06-10 NOTE — Assessment & Plan Note (Deleted)
Hemoglobin dipped down to 8.9.  We will check a ferritin and B12 level tomorrow morning.  Check another hemoglobin tomorrow.

## 2022-06-10 NOTE — Assessment & Plan Note (Addendum)
With wheezing in the lungs chest x-ray performed shows starting of possible pneumonia.  Start Rocephin and Zithromax on 06/10/2022.  Continue nebulizers

## 2022-06-10 NOTE — Progress Notes (Signed)
Received call from patient's sister while off unit. Prior to returning call, patient asked if it was okay to call her sister to which he replied, "I already spoke with her."

## 2022-06-11 DIAGNOSIS — R338 Other retention of urine: Secondary | ICD-10-CM | POA: Diagnosis not present

## 2022-06-11 DIAGNOSIS — R109 Unspecified abdominal pain: Secondary | ICD-10-CM | POA: Diagnosis not present

## 2022-06-11 DIAGNOSIS — E538 Deficiency of other specified B group vitamins: Secondary | ICD-10-CM

## 2022-06-11 DIAGNOSIS — J181 Lobar pneumonia, unspecified organism: Secondary | ICD-10-CM | POA: Diagnosis not present

## 2022-06-11 DIAGNOSIS — I4891 Unspecified atrial fibrillation: Secondary | ICD-10-CM

## 2022-06-11 DIAGNOSIS — D509 Iron deficiency anemia, unspecified: Secondary | ICD-10-CM

## 2022-06-11 LAB — VITAMIN B12: Vitamin B-12: 161 pg/mL — ABNORMAL LOW (ref 180–914)

## 2022-06-11 LAB — TSH: TSH: 2.972 u[IU]/mL (ref 0.350–4.500)

## 2022-06-11 LAB — RPR: RPR Ser Ql: NONREACTIVE

## 2022-06-11 LAB — HEMOGLOBIN: Hemoglobin: 9.4 g/dL — ABNORMAL LOW (ref 13.0–17.0)

## 2022-06-11 LAB — MAGNESIUM: Magnesium: 2 mg/dL (ref 1.7–2.4)

## 2022-06-11 LAB — FERRITIN: Ferritin: 67 ng/mL (ref 24–336)

## 2022-06-11 MED ORDER — ALBUTEROL SULFATE (2.5 MG/3ML) 0.083% IN NEBU
2.5000 mg | INHALATION_SOLUTION | RESPIRATORY_TRACT | Status: DC | PRN
  Filled 2022-06-11: qty 3

## 2022-06-11 MED ORDER — APIXABAN 5 MG PO TABS
5.0000 mg | ORAL_TABLET | Freq: Two times a day (BID) | ORAL | Status: DC
  Administered 2022-06-11 – 2022-06-14 (×7): 5 mg via ORAL
  Filled 2022-06-11 (×7): qty 1

## 2022-06-11 MED ORDER — FERROUS SULFATE 325 (65 FE) MG PO TABS
325.0000 mg | ORAL_TABLET | Freq: Every day | ORAL | Status: DC
  Administered 2022-06-12 – 2022-06-14 (×3): 325 mg via ORAL
  Filled 2022-06-11 (×3): qty 1

## 2022-06-11 MED ORDER — CYANOCOBALAMIN 1000 MCG/ML IJ SOLN
1000.0000 ug | Freq: Every day | INTRAMUSCULAR | Status: AC
Start: 2022-06-11 — End: 2022-06-13
  Administered 2022-06-11 – 2022-06-13 (×3): 1000 ug via INTRAMUSCULAR
  Filled 2022-06-11 (×3): qty 1

## 2022-06-11 NOTE — Progress Notes (Signed)
Progress Note   Patient: Logan Vazquez DGU:[id number] DOB: [birth year] DOA: 06/08/2022     3 DOS: the patient was seen and examined on 06/11/2022   Brief hospital course: 76 year old man with past medical history of CAD, AAA status postrepair, chronic iron deficiency anemia, asthma, COPD, hypertension, chronic back pain.  He presented with sudden onset epigastric pain and chest pain.  Initially thought to be a STEMI but this was ruled out with cardiac cath showing nonobstructive lesions.  Patient had a CT scan of the chest abdomen pelvis that showed aortobifem iliac endograft repair of AAA with no leak.  2 left renal arteries with stenotic ostia.    When I saw the patient he was complaining of constipation.  I also got a bladder ultrasound that showed she was retaining urine.  I started Flomax and inserted a Foley catheter.  The Foley catheter will have to be in for a week.  We got bowel movements going with MiraLAX and lactulose.  The patient had a pneumonia seen on chest x-ray on 6/26 and started on Rocephin and Zithromax.    The patient went into atrial fibrillation in the evening of 6/26 and cardiology was reconsulted.  Physical therapy recommending rehab. Vitamin B12 level low and will give IM supplementation and oral supplementation after the IM supplementation  Assessment and Plan: * Lobar pneumonia (HCC) With wheezing in the lungs chest x-ray performed shows starting of possible pneumonia.  Start Rocephin and Zithromax on 06/10/2022.  Continue nebulizers  Atrial fibrillation (HCC) Suspect paroxysmal in nature.  Cardiology reconsulted.  Patient rate controlled not on any medications currently.  Acute urinary retention Foley catheter in place for greater than 800 mL in the bladder.  Continue Flomax.  Leave Foley catheter in for 1 week and do voiding trial.  Abdominal pain Likely secondary to urinary retention and constipation.  Foley catheter for urinary retention.  Had bowel  movements yesterday.  Constipation Continue MiraLAX daily  Chest pain Troponins negative, cardiac cath negative.  Abdominal aortic aneurysm (AAA) (HCC) CT scan does not show any abdominal aortic aneurysm leak.  Insomnia Continue trazodone at night  Iron deficiency anemia Last hemoglobin 9.4.  Ferritin 67.  Will start low-dose ferrous sulfate.  Vitamin B12 deficiency could be contributing.  Vitamin B12 deficiency We will give B12 IM supplementation while here.  Memory loss Patient's sister concerned about his living alone and his weakness and taking his medications.  Low B12 could be contributing.  Will supplement B12 and see.  Weakness Physical therapy recommending rehab        Subjective: Patient stated he woke up at 3:30 in the morning but did sleep a little bit before hand.  Feels okay.  Patient concerned about the rehab.  Initially admitted with abdominal pain and chest pain.  Found to have urinary retention and constipation.  Also now has pneumonia.  Physical Exam: Vitals:   06/11/22 0720 06/11/22 0802 06/11/22 0900 06/11/22 1230  BP:  98/62  (!) 87/59  Pulse:  88  71  Resp:  18 19 18   Temp:  98.5 F (36.9 C)  98.2 F (36.8 C)  TempSrc:      SpO2: 94% 95%  94%  Weight:      Height:       Physical Exam HENT:     Head: Normocephalic.     Mouth/Throat:     Pharynx: No oropharyngeal exudate.  Eyes:     General: Lids are normal.     Conjunctiva/sclera:  Conjunctivae normal.  Cardiovascular:     Rate and Rhythm: Normal rate. Rhythm irregularly irregular.     Heart sounds: S1 normal and S2 normal. Murmur heard.     Systolic murmur is present with a grade of 2/6.  Pulmonary:     Breath sounds: Examination of the right-lower field reveals decreased breath sounds and rhonchi. Examination of the left-lower field reveals decreased breath sounds and rhonchi. Decreased breath sounds and rhonchi present. No wheezing or rales.  Abdominal:     Palpations: Abdomen is  soft.     Tenderness: There is no abdominal tenderness.  Musculoskeletal:     Right lower leg: No swelling.     Left lower leg: No swelling.  Skin:    General: Skin is warm.     Findings: No rash.  Neurological:     Mental Status: He is alert.     Comments: Answers questions appropriately.     Data Reviewed: Hemoglobin 9.4, B12 level 161, ferritin 67, magnesium 2.0  Family Communication: Spoke with patient's sister on the phone  Disposition: Status is: Inpatient Remains inpatient appropriate because: Since patient went into atrial fibrillation last night and treating for pneumonia we will watch another day here in the hospital.  Continue IV antibiotics. Planned Discharge Destination: Rehab   Author: Alford Highland, MD 06/11/2022 12:56 PM  For on call review [email or web address].

## 2022-06-11 NOTE — TOC Progression Note (Addendum)
Transition of Care [facility]) - Progression Note    Patient Details  Name: Logan Vazquez MRN: [id number] Date of Birth: March 24, 1946  Transition of Care [facility]) CM/SW Contact  Gildardo Griffes, [place] Phone Number: 06/11/2022, 8:46 AM  Clinical Narrative:     Patient informed that [facility] was approved by his insurance [employer], patient has no other bed offers at this time. Patient reports he had heart trouble last night and is currently on a heart monitor and therefore does not believe he will be discharging soon.   Pending medical readiness to dc to [facility].  LVM with patient's sister Samson Frederic to inform of above as patient does not appear to be fully oriented, pending call back.    Expected Discharge Plan: Skilled Nursing Facility Barriers to Discharge: Continued Medical Work up  Expected Discharge Plan and Services Expected Discharge Plan: Skilled Nursing Facility       Living arrangements for the past 2 months: Single Family Home                                       Social Determinants of Health (SDOH) Interventions    Readmission Risk Interventions     No data to display

## 2022-06-11 NOTE — Assessment & Plan Note (Signed)
Last hemoglobin 9.4.  Ferritin 67.  Will start low-dose ferrous sulfate.  Vitamin B12 deficiency could be contributing.

## 2022-06-12 DIAGNOSIS — I4891 Unspecified atrial fibrillation: Secondary | ICD-10-CM | POA: Diagnosis not present

## 2022-06-12 DIAGNOSIS — I2510 Atherosclerotic heart disease of native coronary artery without angina pectoris: Principal | ICD-10-CM

## 2022-06-12 DIAGNOSIS — J181 Lobar pneumonia, unspecified organism: Secondary | ICD-10-CM | POA: Diagnosis not present

## 2022-06-12 MED ORDER — OXYCODONE HCL 5 MG PO TABS
5.0000 mg | ORAL_TABLET | Freq: Four times a day (QID) | ORAL | Status: DC | PRN
  Administered 2022-06-12 – 2022-06-14 (×6): 5 mg via ORAL
  Filled 2022-06-12 (×6): qty 1

## 2022-06-12 MED ORDER — MELATONIN 5 MG PO TABS
5.0000 mg | ORAL_TABLET | Freq: Every day | ORAL | Status: DC
  Administered 2022-06-12 – 2022-06-13 (×2): 5 mg via ORAL
  Filled 2022-06-12 (×2): qty 1

## 2022-06-12 MED ORDER — BENZONATATE 100 MG PO CAPS
200.0000 mg | ORAL_CAPSULE | Freq: Three times a day (TID) | ORAL | Status: DC
  Administered 2022-06-12 – 2022-06-14 (×7): 200 mg via ORAL
  Filled 2022-06-12 (×7): qty 2

## 2022-06-12 MED ORDER — METHOCARBAMOL 500 MG PO TABS
500.0000 mg | ORAL_TABLET | Freq: Three times a day (TID) | ORAL | Status: DC
  Administered 2022-06-12 – 2022-06-14 (×7): 500 mg via ORAL
  Filled 2022-06-12 (×8): qty 1

## 2022-06-12 NOTE — Progress Notes (Signed)
Progress Note  Patient Name: Logan Vazquez Date of Encounter: 06/12/2022  [facility] Cardiologist: Dr. Okey Dupre  Subjective   Denies palpitations, converted spontaneously to sinus rhythm earlier this morning.  Inpatient Medications    Scheduled Meds:  apixaban  5 mg Oral BID   atorvastatin  40 mg Oral Daily   azithromycin  250 mg Oral Daily   benzonatate  200 mg Oral TID   budesonide  0.25 mg Nebulization BID   busPIRone  15 mg Oral BID   Chlorhexidine Gluconate Cloth  6 each Topical Q0600   cyanocobalamin  1,000 mcg Intramuscular Daily   ferrous sulfate  325 mg Oral Q breakfast   FLUoxetine  40 mg Oral Daily   melatonin  5 mg Oral QHS   methocarbamol  500 mg Oral TID   pantoprazole  40 mg Oral Daily   polyethylene glycol  17 g Oral Daily   sodium chloride flush  3 mL Intravenous Q12H   tamsulosin  0.4 mg Oral QPC breakfast   tiotropium  1 capsule Inhalation Daily   traZODone  50 mg Oral QHS   Continuous Infusions:  sodium chloride     cefTRIAXone (ROCEPHIN)  IV 2 g (06/11/22 1032)   PRN Meds: sodium chloride, acetaminophen, albuterol, bisacodyl, iohexol, ondansetron (ZOFRAN) IV, mouth rinse, oxyCODONE, polyvinyl alcohol, sodium chloride flush   Vital Signs    Vitals:   06/11/22 1916 06/11/22 2032 06/12/22 0051 06/12/22 0515  BP:  93/62 102/67 107/77  Pulse:  97 (!) 102 (!) 105  Resp:  18 20 20   Temp:  98.1 F (36.7 C) 98 F (36.7 C) 98 F (36.7 C)  TempSrc:      SpO2: 92% 96% 97% 96%  Weight:      Height:        Intake/Output Summary (Last 24 hours) at 06/12/2022 1019 Last data filed at 06/12/2022 0517 Gross per 24 hour  Intake 560 ml  Output 1250 ml  Net -690 ml      06/08/2022    9:00 AM 06/08/2022    6:50 AM 04/26/2022    1:45 PM  Last 3 Weights  Weight (lbs) 121 lb 7.6 oz 110 lb 118 lb 6.4 oz  Weight (kg) 55.1 kg 49.896 kg 53.706 kg      Telemetry    Sinus rhythm, heart rate 79- Personally Reviewed  ECG     - Personally  Reviewed  Physical Exam   GEN: No acute distress.   Neck: No JVD Cardiac: RRR, no murmurs, rubs, or gallops.  Respiratory: Clear to auscultation bilaterally. GI: Soft, nontender, non-distended  MS: No edema; No deformity. Neuro:  Nonfocal  Psych: Normal affect   Labs    High Sensitivity Troponin:   Recent Labs  Lab 06/08/22 0653 06/08/22 0944  TROPONINIHS 4 3     Chemistry Recent Labs  Lab 06/08/22 0653 06/09/22 0415 06/10/22 0235 06/11/22 0510  NA 136 135 135  --   K 4.1 4.5 3.6  --   CL 107 106 108  --   CO2 24 21* 23  --   GLUCOSE 115* 96 133*  --   BUN 20 20 12   --   CREATININE 1.01 0.89 0.78  --   CALCIUM 8.9 8.6* 8.2*  --   MG  --   --   --  2.0  PROT 7.4  --   --   --   ALBUMIN 4.0  --   --   --  AST 19  --   --   --   ALT 16  --   --   --   ALKPHOS 80  --   --   --   BILITOT 0.3  --   --   --   GFRNONAA >60 >60 >60  --   ANIONGAP 5 8 4*  --     Lipids  Recent Labs  Lab 06/08/22 0653  CHOL 163  TRIG 50  HDL 70  LDLCALC 83  CHOLHDL 2.3    Hematology Recent Labs  Lab 06/08/22 0653 06/09/22 0415 06/10/22 0235 06/11/22 0510  WBC 10.9* 10.9* 6.7  --   RBC 3.76* 3.47* 2.92*  --   HGB 11.3* 10.5* 8.9* 9.4*  HCT 35.5* 33.0* 26.8*  --   MCV 94.4 95.1 91.8  --   MCH 30.1 30.3 30.5  --   MCHC 31.8 31.8 33.2  --   RDW 16.6* 16.8* 16.3*  --   PLT 358 325 237  --    Thyroid  Recent Labs  Lab 06/11/22 0510  TSH 2.972    BNPNo results for input(s): "BNP", "PROBNP" in the last 168 hours.  DDimer No results for input(s): "DDIMER" in the last 168 hours.   Radiology    No results found.  Cardiac Studies   TTE 06/12/2022 1. Left ventricular ejection fraction, by estimation, is 65 to 70%. The  left ventricle has normal function. The left ventricle has no regional  wall motion abnormalities. Left ventricular diastolic parameters are  consistent with Grade I diastolic  dysfunction (impaired relaxation).   2. Right ventricular systolic  function is normal. The right ventricular  size is normal.   3. The mitral valve is normal in structure. Mild mitral valve  regurgitation. No evidence of mitral stenosis.   4. The aortic valve is normal in structure. Aortic valve regurgitation is  moderate. No aortic stenosis is present.   5. The inferior vena cava is normal in size with greater than 50%  respiratory variability, suggesting right atrial pressure of 3 mmHg.   Patient Profile     76 y.o. male with history of CAD-nonobstructive, AAA s/p EVAR, COPD presenting with chest pain, left heart cath showing nonobstructive disease.  Being seen for A-fib with rapid ventricular response.  Assessment & Plan    A-fib RVR new onset -Converted to sinus rhythm earlier this morning -CHA2DS2-VASc score 6.   -Continue Lopressor 12.5 mg twice daily, Eliquis 5 mg twice daily  2.  Nonobstructive CAD -Lopressor 12.5 mg twice daily, Eliquis -Lipitor 40 mg -EF preserved 65%  3.  AAA s/p EVAR -Follow-up with vascular surgery   Patient can be discharged on current medications from a cardiac perspective.  Close follow-up with cardiology as outpatient.  Please let us know if additional input is needed.  Total encounter time more than 50 minutes  Greater than 50% was spent in counseling and coordination of care with the patient    Signed, Debbe Odea, MD  06/12/2022, 10:19 AM

## 2022-06-12 NOTE — TOC Progression Note (Signed)
Transition of Care [facility]) - Progression Note    Patient Details  Name: Logan Vazquez MRN: [id number] Date of Birth: 25-May-1946  Transition of Care [facility]) CM/SW Contact  Gildardo Griffes, [place] Phone Number: 06/12/2022, 9:56 AM  Clinical Narrative:     Patient's sister Logan Vazquez calls CSW back, reports she wants what's best for patient and acknowledges his confusion at times, reports she has been encouraging him to go to SNF however patient seems back and forth about it. Reports she would like to re visit any other bed offers as previously patient only had bed offer from [facility].   Appears patient has a new bed offer from [facility], Logan Vazquez reports they would like this instead and reports patient would be more agreeable to snf if he new he were going to [facility].   Above relayed to [facility] for f/u with updating insurance for [facility].    Expected Discharge Plan: Skilled Nursing Facility Barriers to Discharge: Continued Medical Work up  Expected Discharge Plan and Services Expected Discharge Plan: Skilled Nursing Facility       Living arrangements for the past 2 months: Single Family Home                                       Social Determinants of Health (SDOH) Interventions    Readmission Risk Interventions     No data to display

## 2022-06-12 NOTE — Progress Notes (Signed)
Physical Therapy Treatment Patient Details Name: Logan Vazquez MRN: [id number] DOB: 1946/07/25 Today's Date: 06/12/2022   History of Present Illness Pt is a 76 y/o M admitted on 06/08/22 after presenting with c/o sudden onset epigastric pain & chest pain. Pt admitted for work-up. Pt underwent cardiac cath on 06/08/22. PMH: CAD with remote PCI, AAA s/p EVAR, chronic iron deficiency anemia, mild intermittent asthma, COPD, HTN, chronic back pain on narcotic dependence, glaucoma    PT Comments    Pt in bed, agreeable to therapy. Ambulation distance increased to 18ft this date. Safety also improved with no knee buckling however he does continue to push walker too far in front of him and does not receive cueing to correct well. He returned to bed Mod I. Pt mobility status is progressing nicely. Due to pt living alone, continuing to rec SNF at d/c to maximize independence and safety once home. Would benefit from skilled PT to address above deficits and promote optimal return to PLOF.   Recommendations for follow up therapy are one component of a multi-disciplinary discharge planning process, led by the attending physician.  Recommendations may be updated based on patient status, additional functional criteria and insurance authorization.  Follow Up Recommendations  Skilled nursing-short term rehab (<3 hours/day) Can patient physically be transported by private vehicle: Yes   Assistance Recommended at Discharge Intermittent Supervision/Assistance  Patient can return home with the following Assistance with cooking/housework;Help with stairs or ramp for entrance;Assist for transportation;A little help with walking and/or transfers;A little help with bathing/dressing/bathroom   Equipment Recommendations  None recommended by PT    Recommendations for Other Services       Precautions / Restrictions Precautions Precautions: Fall Restrictions Weight Bearing Restrictions: No     Mobility  Bed  Mobility Overal bed mobility: Modified Independent             General bed mobility comments: sup<>sit    Transfers   Equipment used: Rolling walker (2 wheels) Transfers: Sit to/from Stand Sit to Stand: Supervision                Ambulation/Gait Ambulation/Gait assistance: Supervision Gait Distance (Feet): 130 Feet Assistive device: Rolling walker (2 wheels) Gait Pattern/deviations: Decreased stride length, Step-through pattern, Trunk flexed Gait velocity: decreased (improved from yesterday)     General Gait Details: 135ft with SUP; no knee buckling this date. Continues to ambulate with RW pushed too far in front of pt.   Stairs             Wheelchair Mobility    Modified Rankin (Stroke Patients Only)       Balance                                            Cognition Arousal/Alertness: Awake/alert Behavior During Therapy: WFL for tasks assessed/performed Overall Cognitive Status: No family/caregiver present to determine baseline cognitive functioning                                          Exercises      General Comments        Pertinent Vitals/Pain Pain Assessment Pain Assessment: Faces Faces Pain Scale: Hurts little more Pain Location: hip during bed mobility Pain Intervention(s): Repositioned, Limited activity within patient's tolerance  Home Living                          Prior Function            PT Goals (current goals can now be found in the care plan section) Acute Rehab PT Goals Patient Stated Goal: to d/c home PT Goal Formulation: With patient Time For Goal Achievement: 06/23/22 Potential to Achieve Goals: Good    Frequency    Min 2X/week      PT Plan      Co-evaluation              AM-PAC PT "6 Clicks" Mobility   Outcome Measure  Help needed turning from your back to your side while in a flat bed without using bedrails?: None Help needed moving  from lying on your back to sitting on the side of a flat bed without using bedrails?: None Help needed moving to and from a bed to a chair (including a wheelchair)?: A Little Help needed standing up from a chair using your arms (e.g., wheelchair or bedside chair)?: A Little Help needed to walk in hospital room?: A Little Help needed climbing 3-5 steps with a railing? : A Little 6 Click Score: 20    End of Session Equipment Utilized During Treatment: Gait belt Activity Tolerance: Patient tolerated treatment well Patient left: in bed;with bed alarm set;with call bell/phone within reach Nurse Communication: Mobility status PT Visit Diagnosis: Unsteadiness on feet (R26.81);Muscle weakness (generalized) (M62.81)     Time: 9485-4627 PT Time Calculation (min) (ACUTE ONLY): 11 min  Charges:  $Therapeutic Exercise: 8-22 mins                     Basilia Jumbo PT, DPT

## 2022-06-12 NOTE — TOC Progression Note (Signed)
Transition of Care [facility]) - Progression Note    Patient Details  Name: KRISTEN BUSHWAY MRN: [id number] Date of Birth: Sep 30, 1946  Transition of Care [facility]) CM/SW Contact  Maree Krabbe, LCSW Phone Number: 06/12/2022, 10:14 AM  Clinical Narrative:   CSW spoke with pt and pt does state if he decides to go to SNF he would prefer [facility] over [facility]. However, pt states he is not sure he wants to go to SNF yet and will decide when he is discharged. CSW did change facility in [employer].    Expected Discharge Plan: Skilled Nursing Facility Barriers to Discharge: Continued Medical Work up  Expected Discharge Plan and Services Expected Discharge Plan: Skilled Nursing Facility       Living arrangements for the past 2 months: Single Family Home                                       Social Determinants of Health (SDOH) Interventions    Readmission Risk Interventions     No data to display

## 2022-06-12 NOTE — Progress Notes (Signed)
Occupational Therapy Treatment Patient Details Name: Logan Vazquez MRN: [id number] DOB: [birth year] Today's Date: 06/12/2022   History of present illness Pt is a 76 y/o M admitted on 06/08/22 after presenting with c/o sudden onset epigastric pain & chest pain. Pt admitted for work-up. Pt underwent cardiac cath on 06/08/22. PMH: CAD with remote PCI, AAA s/p EVAR, chronic iron deficiency anemia, mild intermittent asthma, COPD, HTN, chronic back pain on narcotic dependence, glaucoma   OT comments  Mr. Carreker was able to ambulate with more stability today, although still displayed several LOB episodes while using RW. Pt has generalized weakness, limited endurance:  he is able to engage in grooming tasks in standing for ~ 3 minutes, then needs to complete in sitting. Pt completes grooming tasks with great attention to detail. Pt is anxious and becomes provoked easily; he continues to state that he will not consider DC to SNF. Recommending SNF at this time, although pt is close to seeming safe to DC home alone with HHOT.   Recommendations for follow up therapy are one component of a multi-disciplinary discharge planning process, led by the attending physician.  Recommendations may be updated based on patient status, additional functional criteria and insurance authorization.    Follow Up Recommendations  Other (comment) (still recommend SNF at this point, but hopefully can upgrade to [facility] if pt continues to make good progress)    Assistance Recommended at Discharge Intermittent Supervision/Assistance  Patient can return home with the following  A little help with walking and/or transfers;A little help with bathing/dressing/bathroom;Assistance with cooking/housework   Equipment Recommendations  None recommended by OT    Recommendations for Other Services      Precautions / Restrictions Precautions Precautions: Fall Restrictions Weight Bearing Restrictions: No       Mobility Bed  Mobility Overal bed mobility: Modified Independent             General bed mobility comments: able to transfer sit<>stand, scoot towards HOB in supine    Transfers Overall transfer level: Needs assistance Equipment used: Rolling walker (2 wheels) Transfers: Sit to/from Stand, Bed to chair/wheelchair/BSC Sit to Stand: Supervision     Step pivot transfers: Min guard           Balance Overall balance assessment: Needs assistance (Simultaneous filing. User may not have seen previous data.) Sitting-balance support: Feet supported (Simultaneous filing. User may not have seen previous data.) Sitting balance-Leahy Scale: Good (Simultaneous filing. User may not have seen previous data.)     Standing balance support: During functional activity, Bilateral upper extremity supported, Reliant on assistive device for balance, Single extremity supported (Simultaneous filing. User may not have seen previous data.) Standing balance-Leahy Scale: Fair (Simultaneous filing. User may not have seen previous data.) Standing balance comment: able to stand at sink with one hand on cabinet; some LOB during ambulation with RW (Simultaneous filing. User may not have seen previous data.)                           ADL either performed or assessed with clinical judgement   ADL Overall ADL's : Needs assistance/impaired     Grooming: Wash/dry hands;Wash/dry face;Oral care;Brushing hair;Sitting;Standing;Min guard;Set up Grooming Details (indicate cue type and reason): completed in sitting and standing Upper Body Bathing: Min guard;Sitting  Extremity/Trunk Assessment Upper Extremity Assessment Upper Extremity Assessment: Overall WFL for tasks assessed   Lower Extremity Assessment Lower Extremity Assessment: Generalized weakness   Cervical / Trunk Assessment Cervical / Trunk Assessment: Kyphotic    Vision       Perception     Praxis       Cognition Arousal/Alertness: Awake/alert Behavior During Therapy: WFL for tasks assessed/performed Overall Cognitive Status: No family/caregiver present to determine baseline cognitive functioning                                 General Comments: A&O, although still somewhat perseverating on past injuries, displaying high level on anxiety        Exercises Other Exercises Other Exercises: Educ re: activity tolerance, falls prevention, routines modification, DC recs    Shoulder Instructions       General Comments      Pertinent Vitals/ Pain          Home Living                                          Prior Functioning/Environment              Frequency  Min 2X/week        Progress Toward Goals  OT Goals(current goals can now be found in the care plan section)  Progress towards OT goals: Progressing toward goals  Acute Rehab OT Goals OT Goal Formulation: With patient Time For Goal Achievement: 06/25/22 Potential to Achieve Goals: Good  Plan Discharge plan remains appropriate;Frequency remains appropriate    Co-evaluation                 AM-PAC OT "6 Clicks" Daily Activity     Outcome Measure   Help from another person eating meals?: None Help from another person taking care of personal grooming?: A Little Help from another person toileting, which includes using toliet, bedpan, or urinal?: A Little Help from another person bathing (including washing, rinsing, drying)?: A Little Help from another person to put on and taking off regular upper body clothing?: None Help from another person to put on and taking off regular lower body clothing?: A Little 6 Click Score: 20    End of Session Equipment Utilized During Treatment: Rolling walker (2 wheels)  OT Visit Diagnosis: Unsteadiness on feet (R26.81);Muscle weakness (generalized) (M62.81);Pain   Activity Tolerance Patient tolerated treatment well   Patient Left  in bed;with call bell/phone within reach;with bed alarm set   Nurse Communication          Time: 4098-1191 OT Time Calculation (min): 24 min  Charges: OT Treatments $Self Care/Home Management : 23-37 mins Latina Craver, PhD, MS, OTR/L 06/12/22, 12:25 PM

## 2022-06-12 NOTE — Progress Notes (Signed)
PROGRESS NOTE    Logan Vazquez  [name]:[id number] DOB: 1946/01/03 DOA: 06/08/2022 PCP: Oswaldo Conroy, MD    Brief Narrative:  76 year old man with past medical history of CAD, AAA status postrepair, chronic iron deficiency anemia, asthma, COPD, hypertension, chronic back pain.  He presented with sudden onset epigastric pain and chest pain.   Initially thought to be a STEMI but this was ruled out with cardiac cath showing nonobstructive lesions.  Patient had a CT scan of the chest abdomen pelvis that showed aortobifem iliac endograft repair of AAA with no leak.  2 left renal arteries with stenotic ostia.     When I saw the patient he was complaining of constipation.  I also got a bladder ultrasound that showed she was retaining urine.  I started Flomax and inserted a Foley catheter.  The Foley catheter will have to be in for a week.  We got bowel movements going with MiraLAX and lactulose.   The patient had a pneumonia seen on chest x-ray on 6/26 and started on Rocephin and Zithromax.     The patient went into atrial fibrillation in the evening of 6/26 and cardiology was reconsulted.   Physical therapy recommending rehab.  6/28.  Patient doing well overall.  Still with some symptoms of infectious process.  Remains on IV antibiotics.   Assessment & Plan:   Principal Problem:   Lobar pneumonia (HCC) Active Problems:   New onset atrial fibrillation (HCC)   Abdominal pain   Acute urinary retention   Constipation   Chest pain   Abdominal aortic aneurysm (AAA) (HCC)   Insomnia   Anemia, unspecified   Weakness   Memory loss   Vitamin B12 deficiency   Iron deficiency anemia  Lobar pneumonia (HCC) With wheezing in the lungs chest x-ray performed shows starting of possible pneumonia.  Started Rocephin and Zithromax on 06/10/2022.  Continue nebulizers.  Will recommend 5 to 7-day antibiotic course.  Anticipated date of discharge 6/29   Atrial fibrillation (HCC) Suspect paroxysmal  in nature.  Cardiology reconsulted.  Patient rate controlled not on any medications currently.  Cleared for discharge from cardiology standpoint   Acute urinary retention Foley catheter in place for greater than 800 mL in the bladder.  Continue Flomax.  Leave Foley catheter in for 1 week and do voiding trial.   Abdominal pain Likely secondary to urinary retention and constipation.  Foley catheter for urinary retention.  Had bowel movements 6/26.   Constipation Continue MiraLAX daily   Chest pain Troponins negative, cardiac cath negative.   Abdominal aortic aneurysm (AAA) (HCC) CT scan does not show any abdominal aortic aneurysm leak.   Insomnia Continue trazodone at night.  Ensure pain control.  Added as needed melatonin   Iron deficiency anemia Last hemoglobin 9.4.  Ferritin 67.  Continue low-dose ferrous sulfate.  Vitamin B12 deficiency could be contributing.   Vitamin B12 deficiency We will give B12 IM supplementation while here.  P.o. B12 supplementation at time of discharge   Memory loss Patient's sister concerned about his living alone and his weakness and taking his medications.  Low B12 could be contributing.  Will supplement B12 and see.  Will need outpatient follow-up   Weakness Physical therapy recommending rehab   DVT prophylaxis: Apixaban Code Status: Full Family Communication:None today Disposition Plan: Status is: Inpatient Remains inpatient appropriate because: Pneumonia on IV antibiotics.  Remains symptomatic.  Clinically improving.  Anticipate medical readiness for discharge 6/29.   Level of care: Telemetry Medical  Consultants:  Cardiology  Procedures:  Cardiac catheterization  Antimicrobials: Ceftriaxone Azithromycin   Subjective: Patient seen and examined.  Endorses cough and poor sleep.  Endorses fatigue.  Objective: Vitals:   06/11/22 2032 06/12/22 0051 06/12/22 0515 06/12/22 1123  BP: 93/62 102/67 107/77 123/74  Pulse: 97 (!) 102 (!)  105 80  Resp: 18 20 20    Temp: 98.1 F (36.7 C) 98 F (36.7 C) 98 F (36.7 C)   TempSrc:      SpO2: 96% 97% 96% 97%  Weight:      Height:        Intake/Output Summary (Last 24 hours) at 06/12/2022 1144 Last data filed at 06/12/2022 1029 Gross per 24 hour  Intake 560 ml  Output 2450 ml  Net -1890 ml   Filed Weights   06/08/22 0650 06/08/22 0900  Weight: 49.9 kg 55.1 kg    Examination:  General exam: NAD.  Frail-appearing Respiratory system: Bibasilar crackles.  Normal work of breathing.  Room air Cardiovascular system: S1-S2, RRR, no murmurs, no pedal edema Gastrointestinal system: Soft, NT/ND, normal bowel sounds Central nervous system: Alert and oriented. No focal neurological deficits. Extremities: Symmetric 5 x 5 power. Skin: No rashes, lesions or ulcers Psychiatry: Judgement and insight appear normal. Mood & affect appropriate.     Data Reviewed: I have personally reviewed following labs and imaging studies  CBC: Recent Labs  Lab 06/08/22 0653 06/09/22 0415 06/10/22 0235 06/11/22 0510  WBC 10.9* 10.9* 6.7  --   HGB 11.3* 10.5* 8.9* 9.4*  HCT 35.5* 33.0* 26.8*  --   MCV 94.4 95.1 91.8  --   PLT 358 325 237  --    Basic Metabolic Panel: Recent Labs  Lab 06/08/22 0653 06/09/22 0415 06/10/22 0235 06/11/22 0510  NA 136 135 135  --   K 4.1 4.5 3.6  --   CL 107 106 108  --   CO2 24 21* 23  --   GLUCOSE 115* 96 133*  --   BUN 20 20 12   --   CREATININE 1.01 0.89 0.78  --   CALCIUM 8.9 8.6* 8.2*  --   MG  --   --   --  2.0   GFR: Estimated Creatinine Clearance: 61.2 mL/min (by C-G formula based on SCr of 0.78 mg/dL). Liver Function Tests: Recent Labs  Lab 06/08/22 0653  AST 19  ALT 16  ALKPHOS 80  BILITOT 0.3  PROT 7.4  ALBUMIN 4.0   Recent Labs  Lab 06/08/22 0653  LIPASE 25   No results for input(s): "AMMONIA" in the last 168 hours. Coagulation Profile: Recent Labs  Lab 06/08/22 0653  INR 1.0   Cardiac Enzymes: No results for  input(s): "CKTOTAL", "CKMB", "CKMBINDEX", "TROPONINI" in the last 168 hours. BNP (last 3 results) No results for input(s): "PROBNP" in the last 8760 hours. HbA1C: No results for input(s): "HGBA1C" in the last 72 hours. CBG: Recent Labs  Lab 06/08/22 0913  GLUCAP 106*   Lipid Profile: No results for input(s): "CHOL", "HDL", "LDLCALC", "TRIG", "CHOLHDL", "LDLDIRECT" in the last 72 hours. Thyroid Function Tests: Recent Labs    06/11/22 0510  TSH 2.972   Anemia Panel: Recent Labs    06/11/22 0510  VITAMINB12 161*  FERRITIN 67   Sepsis Labs: No results for input(s): "PROCALCITON", "LATICACIDVEN" in the last 168 hours.  Recent Results (from the past 240 hour(s))  Resp Panel by RT-PCR (Flu A&B, Covid) Anterior Nasal Swab     Status: None  Collection Time: 06/08/22  6:53 AM   Specimen: Anterior Nasal Swab  Result Value Ref Range Status   SARS Coronavirus 2 by RT PCR NEGATIVE NEGATIVE Final    Comment: (NOTE) SARS-CoV-2 target nucleic acids are NOT DETECTED.  The SARS-CoV-2 RNA is generally detectable in upper respiratory specimens during the acute phase of infection. The lowest concentration of SARS-CoV-2 viral copies this assay can detect is 138 copies/mL. A negative result does not preclude SARS-Cov-2 infection and should not be used as the sole basis for treatment or other patient management decisions. A negative result may occur with  improper specimen collection/handling, submission of specimen other than nasopharyngeal swab, presence of viral mutation(s) within the areas targeted by this assay, and inadequate number of viral copies(<138 copies/mL). A negative result must be combined with clinical observations, patient history, and epidemiological information. The expected result is Negative.  Fact Sheet for Patients:  BloggerCourse.com  Fact Sheet for Healthcare Providers:  SeriousBroker.it  This test is no t yet  approved or cleared by the [place] FDA and  has been authorized for detection and/or diagnosis of SARS-CoV-2 by FDA under an Emergency Use Authorization (EUA). This EUA will remain  in effect (meaning this test can be used) for the duration of the COVID-19 declaration under Section 564(b)(1) of the Act, 21 U.S.C.section 360bbb-3(b)(1), unless the authorization is terminated  or revoked sooner.       Influenza A by PCR NEGATIVE NEGATIVE Final   Influenza B by PCR NEGATIVE NEGATIVE Final    Comment: (NOTE) The Xpert Xpress SARS-CoV-2/FLU/RSV plus assay is intended as an aid in the diagnosis of influenza from Nasopharyngeal swab specimens and should not be used as a sole basis for treatment. Nasal washings and aspirates are unacceptable for Xpert Xpress SARS-CoV-2/FLU/RSV testing.  Fact Sheet for Patients: BloggerCourse.com  Fact Sheet for Healthcare Providers: SeriousBroker.it  This test is not yet approved or cleared by the [place] FDA and has been authorized for detection and/or diagnosis of SARS-CoV-2 by FDA under an Emergency Use Authorization (EUA). This EUA will remain in effect (meaning this test can be used) for the duration of the COVID-19 declaration under Section 564(b)(1) of the Act, 21 U.S.C. section 360bbb-3(b)(1), unless the authorization is terminated or revoked.  Performed at [facility], [address] [place], [place] [address]   MRSA Next Gen by PCR, Nasal     Status: None   Collection Time: 06/08/22  9:15 AM   Specimen: Nasal Mucosa; Nasal Swab  Result Value Ref Range Status   MRSA by PCR Next Gen NOT DETECTED NOT DETECTED Final    Comment: (NOTE) The GeneXpert MRSA Assay (FDA approved for NASAL specimens only), is one component of a comprehensive MRSA colonization surveillance program. It is not intended to diagnose MRSA infection nor to guide or monitor treatment for MRSA  infections. Test performance is not FDA approved in patients less than 66 years old. Performed at [facility], [address], [place], [place] [address]          Radiology Studies: No results found.      Scheduled Meds:  apixaban  5 mg Oral BID   atorvastatin  40 mg Oral Daily   azithromycin  250 mg Oral Daily   benzonatate  200 mg Oral TID   budesonide  0.25 mg Nebulization BID   busPIRone  15 mg Oral BID   Chlorhexidine Gluconate Cloth  6 each Topical Q0600   cyanocobalamin  1,000 mcg  Intramuscular Daily   ferrous sulfate  325 mg Oral Q breakfast   FLUoxetine  40 mg Oral Daily   melatonin  5 mg Oral QHS   methocarbamol  500 mg Oral TID   pantoprazole  40 mg Oral Daily   polyethylene glycol  17 g Oral Daily   sodium chloride flush  3 mL Intravenous Q12H   tamsulosin  0.4 mg Oral QPC breakfast   tiotropium  1 capsule Inhalation Daily   traZODone  50 mg Oral QHS   Continuous Infusions:  sodium chloride     cefTRIAXone (ROCEPHIN)  IV 2 g (06/12/22 1052)     LOS: 4 days       Tresa Moore, MD [facility]   If 7PM-7AM, please contact night-coverage  06/12/2022, 11:44 AM

## 2022-06-13 ENCOUNTER — Encounter: Payer: Self-pay | Admitting: Internal Medicine

## 2022-06-13 DIAGNOSIS — J181 Lobar pneumonia, unspecified organism: Secondary | ICD-10-CM | POA: Diagnosis not present

## 2022-06-13 LAB — CBC
HCT: 35 % — ABNORMAL LOW (ref 39.0–52.0)
Hemoglobin: 11.4 g/dL — ABNORMAL LOW (ref 13.0–17.0)
MCH: 29.6 pg (ref 26.0–34.0)
MCHC: 32.6 g/dL (ref 30.0–36.0)
MCV: 90.9 fL (ref 80.0–100.0)
Platelets: 335 10*3/uL (ref 150–400)
RBC: 3.85 MIL/uL — ABNORMAL LOW (ref 4.22–5.81)
RDW: 16.2 % — ABNORMAL HIGH (ref 11.5–15.5)
WBC: 6.9 10*3/uL (ref 4.0–10.5)
nRBC: 0 % (ref 0.0–0.2)

## 2022-06-13 LAB — BASIC METABOLIC PANEL
Anion gap: 8 (ref 5–15)
BUN: 15 mg/dL (ref 8–23)
CO2: 23 mmol/L (ref 22–32)
Calcium: 8.8 mg/dL — ABNORMAL LOW (ref 8.9–10.3)
Chloride: 107 mmol/L (ref 98–111)
Creatinine, Ser: 0.71 mg/dL (ref 0.61–1.24)
GFR, Estimated: >60 mL/min (ref >60)
Glucose, Bld: 90 mg/dL (ref 70–99)
Potassium: 4.2 mmol/L (ref 3.5–5.1)
Sodium: 138 mmol/L (ref 135–145)

## 2022-06-13 LAB — MAGNESIUM: Magnesium: 2.1 mg/dL (ref 1.7–2.4)

## 2022-06-13 MED ORDER — GABAPENTIN 300 MG PO CAPS
300.0000 mg | ORAL_CAPSULE | Freq: Two times a day (BID) | ORAL | Status: DC
  Administered 2022-06-13 – 2022-06-14 (×3): 300 mg via ORAL
  Filled 2022-06-13 (×3): qty 1

## 2022-06-13 NOTE — Progress Notes (Signed)
PROGRESS NOTE    Logan Vazquez  TLX:[id number] DOB: 1946-03-24 DOA: 06/08/2022 PCP: Oswaldo Conroy, MD    Brief Narrative:  76 year old man with past medical history of CAD, AAA status postrepair, chronic iron deficiency anemia, asthma, COPD, hypertension, chronic back pain.  He presented with sudden onset epigastric pain and chest pain.   Initially thought to be a STEMI but this was ruled out with cardiac cath showing nonobstructive lesions.  Patient had a CT scan of the chest abdomen pelvis that showed aortobifem iliac endograft repair of AAA with no leak.  2 left renal arteries with stenotic ostia.     When I saw the patient he was complaining of constipation.  I also got a bladder ultrasound that showed she was retaining urine.  I started Flomax and inserted a Foley catheter.  The Foley catheter will have to be in for a week.  We got bowel movements going with MiraLAX and lactulose.   The patient had a pneumonia seen on chest x-ray on 6/26 and started on Rocephin and Zithromax.     The patient went into atrial fibrillation in the evening of 6/26 and cardiology was reconsulted.   Physical therapy recommending rehab.  6/28.  Patient doing well overall.  Still with some symptoms of infectious process.  Remains on IV antibiotics.   Assessment & Plan:   Principal Problem:   Lobar pneumonia (HCC) Active Problems:   New onset atrial fibrillation (HCC)   Abdominal pain   Acute urinary retention   Constipation   Chest pain   Abdominal aortic aneurysm (AAA) (HCC)   Insomnia   Anemia, unspecified   Weakness   Memory loss   Vitamin B12 deficiency   Iron deficiency anemia  Lobar pneumonia (HCC) With wheezing in the lungs chest x-ray performed shows starting of possible pneumonia.  Started Rocephin and Zithromax on 06/10/2022.  Continue nebulizers.  Respiratory status slowly improving.  Anticipate dc 6/30   Atrial fibrillation (HCC) Suspect paroxysmal in nature.  Cardiology  reconsulted.  Patient rate controlled not on any medications currently.  Cleared for discharge from cardiology standpoint   Acute urinary retention Foley catheter in place for greater than 800 mL in the bladder.  Continue Flomax.  Leave Foley catheter in for 1 week and do voiding trial.   Abdominal pain Likely secondary to urinary retention and constipation.  Foley catheter for urinary retention.  Had bowel movements 6/26.   Constipation Continue MiraLAX daily   Chest pain Troponins negative, cardiac cath negative.   Abdominal aortic aneurysm (AAA) (HCC) CT scan does not show any abdominal aortic aneurysm leak.   Insomnia Continue trazodone at night.  Ensure pain control.  Added as needed melatonin   Iron deficiency anemia Last hemoglobin 9.4.  Ferritin 67.  Continue low-dose ferrous sulfate.  Vitamin B12 deficiency could be contributing.   Vitamin B12 deficiency We will give B12 IM supplementation while here.  P.o. B12 supplementation at time of discharge   Memory loss Patient's sister concerned about his living alone and his weakness and taking his medications.  Low B12 could be contributing.  Will supplement B12 and see.  Will need outpatient follow-up   Weakness Physical therapy recommending rehab   DVT prophylaxis: Apixaban Code Status: Full Family Communication:None today Disposition Plan: Status is: Inpatient Remains inpatient appropriate because: Pneumonia on IV antibiotics.  Remains symptomatic.  Clinically improving.  Anticipate medical readiness for discharge 6/30   Level of care: Telemetry Medical  Consultants:  Cardiology  Procedures:  Cardiac catheterization  Antimicrobials: Ceftriaxone Azithromycin   Subjective: Patient seen and examined.  Endorses cough and poor sleep.  Endorses fatigue.  Slowly improving over interval  Objective: Vitals:   06/12/22 1642 06/12/22 2014 06/13/22 0545 06/13/22 0728  BP: 116/68 120/71 127/72 106/75  Pulse: 90 86  79 83  Resp: 16 18 16 18   Temp: 98.7 F (37.1 C) 98 F (36.7 C) 98.5 F (36.9 C) 98.1 F (36.7 C)  TempSrc:      SpO2: 94% 96% 98% 96%  Weight:      Height:        Intake/Output Summary (Last 24 hours) at 06/13/2022 1035 Last data filed at 06/12/2022 1734 Gross per 24 hour  Intake --  Output 700 ml  Net -700 ml   Filed Weights   06/08/22 0650 06/08/22 0900  Weight: 49.9 kg 55.1 kg    Examination:  General exam: NAD.  Frail-appearing Respiratory system: Mild bibasilar crackles, normal WOB, RA Cardiovascular system: S1-S2, RRR, no murmurs, no pedal edema Gastrointestinal system: Soft, NT/ND, normal bowel sounds Central nervous system: Alert and oriented. No focal neurological deficits. Extremities: Symmetric 5 x 5 power. Skin: No rashes, lesions or ulcers Psychiatry: Judgement and insight appear normal. Mood & affect appropriate.     Data Reviewed: I have personally reviewed following labs and imaging studies  CBC: Recent Labs  Lab 06/08/22 0653 06/09/22 0415 06/10/22 0235 06/11/22 0510 06/13/22 0757  WBC 10.9* 10.9* 6.7  --  6.9  HGB 11.3* 10.5* 8.9* 9.4* 11.4*  HCT 35.5* 33.0* 26.8*  --  35.0*  MCV 94.4 95.1 91.8  --  90.9  PLT 358 325 237  --  335   Basic Metabolic Panel: Recent Labs  Lab 06/08/22 0653 06/09/22 0415 06/10/22 0235 06/11/22 0510 06/13/22 0757  NA 136 135 135  --  138  K 4.1 4.5 3.6  --  4.2  CL 107 106 108  --  107  CO2 24 21* 23  --  23  GLUCOSE 115* 96 133*  --  90  BUN 20 20 12   --  15  CREATININE 1.01 0.89 0.78  --  0.71  CALCIUM 8.9 8.6* 8.2*  --  8.8*  MG  --   --   --  2.0 2.1   GFR: Estimated Creatinine Clearance: 61.2 mL/min (by C-G formula based on SCr of 0.71 mg/dL). Liver Function Tests: Recent Labs  Lab 06/08/22 0653  AST 19  ALT 16  ALKPHOS 80  BILITOT 0.3  PROT 7.4  ALBUMIN 4.0   Recent Labs  Lab 06/08/22 0653  LIPASE 25   No results for input(s): "AMMONIA" in the last 168 hours. Coagulation  Profile: Recent Labs  Lab 06/08/22 0653  INR 1.0   Cardiac Enzymes: No results for input(s): "CKTOTAL", "CKMB", "CKMBINDEX", "TROPONINI" in the last 168 hours. BNP (last 3 results) No results for input(s): "PROBNP" in the last 8760 hours. HbA1C: No results for input(s): "HGBA1C" in the last 72 hours. CBG: Recent Labs  Lab 06/08/22 0913  GLUCAP 106*   Lipid Profile: No results for input(s): "CHOL", "HDL", "LDLCALC", "TRIG", "CHOLHDL", "LDLDIRECT" in the last 72 hours. Thyroid Function Tests: Recent Labs    06/11/22 0510  TSH 2.972   Anemia Panel: Recent Labs    06/11/22 0510  VITAMINB12 161*  FERRITIN 67   Sepsis Labs: No results for input(s): "PROCALCITON", "LATICACIDVEN" in the last 168 hours.  Recent Results (from the past 240 hour(s))  Resp  Panel by RT-PCR (Flu A&B, Covid) Anterior Nasal Swab     Status: None   Collection Time: 06/08/22  6:53 AM   Specimen: Anterior Nasal Swab  Result Value Ref Range Status   SARS Coronavirus 2 by RT PCR NEGATIVE NEGATIVE Final    Comment: (NOTE) SARS-CoV-2 target nucleic acids are NOT DETECTED.  The SARS-CoV-2 RNA is generally detectable in upper respiratory specimens during the acute phase of infection. The lowest concentration of SARS-CoV-2 viral copies this assay can detect is 138 copies/mL. A negative result does not preclude SARS-Cov-2 infection and should not be used as the sole basis for treatment or other patient management decisions. A negative result may occur with  improper specimen collection/handling, submission of specimen other than nasopharyngeal swab, presence of viral mutation(s) within the areas targeted by this assay, and inadequate number of viral copies(<138 copies/mL). A negative result must be combined with clinical observations, patient history, and epidemiological information. The expected result is Negative.  Fact Sheet for Patients:  BloggerCourse.com  Fact Sheet for  Healthcare Providers:  SeriousBroker.it  This test is no t yet approved or cleared by the [place] FDA and  has been authorized for detection and/or diagnosis of SARS-CoV-2 by FDA under an Emergency Use Authorization (EUA). This EUA will remain  in effect (meaning this test can be used) for the duration of the COVID-19 declaration under Section 564(b)(1) of the Act, 21 U.S.C.section 360bbb-3(b)(1), unless the authorization is terminated  or revoked sooner.       Influenza A by PCR NEGATIVE NEGATIVE Final   Influenza B by PCR NEGATIVE NEGATIVE Final    Comment: (NOTE) The Xpert Xpress SARS-CoV-2/FLU/RSV plus assay is intended as an aid in the diagnosis of influenza from Nasopharyngeal swab specimens and should not be used as a sole basis for treatment. Nasal washings and aspirates are unacceptable for Xpert Xpress SARS-CoV-2/FLU/RSV testing.  Fact Sheet for Patients: BloggerCourse.com  Fact Sheet for Healthcare Providers: SeriousBroker.it  This test is not yet approved or cleared by the [place] FDA and has been authorized for detection and/or diagnosis of SARS-CoV-2 by FDA under an Emergency Use Authorization (EUA). This EUA will remain in effect (meaning this test can be used) for the duration of the COVID-19 declaration under Section 564(b)(1) of the Act, 21 U.S.C. section 360bbb-3(b)(1), unless the authorization is terminated or revoked.  Performed at [facility], [address] [place], [place] [address]   MRSA Next Gen by PCR, Nasal     Status: None   Collection Time: 06/08/22  9:15 AM   Specimen: Nasal Mucosa; Nasal Swab  Result Value Ref Range Status   MRSA by PCR Next Gen NOT DETECTED NOT DETECTED Final    Comment: (NOTE) The GeneXpert MRSA Assay (FDA approved for NASAL specimens only), is one component of a comprehensive MRSA colonization surveillance program. It is  not intended to diagnose MRSA infection nor to guide or monitor treatment for MRSA infections. Test performance is not FDA approved in patients less than 50 years old. Performed at [facility], [address] [place], [place] [address]          Radiology Studies: No results found.      Scheduled Meds:  apixaban  5 mg Oral BID   atorvastatin  40 mg Oral Daily   azithromycin  250 mg Oral Daily   benzonatate  200 mg Oral TID   budesonide  0.25 mg Nebulization BID   busPIRone  15 mg Oral  BID   Chlorhexidine Gluconate Cloth  6 each Topical Q0600   ferrous sulfate  325 mg Oral Q breakfast   FLUoxetine  40 mg Oral Daily   melatonin  5 mg Oral QHS   methocarbamol  500 mg Oral TID   pantoprazole  40 mg Oral Daily   polyethylene glycol  17 g Oral Daily   sodium chloride flush  3 mL Intravenous Q12H   tamsulosin  0.4 mg Oral QPC breakfast   tiotropium  1 capsule Inhalation Daily   traZODone  50 mg Oral QHS   Continuous Infusions:  sodium chloride     cefTRIAXone (ROCEPHIN)  IV 2 g (06/12/22 1052)     LOS: 5 days       Tresa Moore, MD [facility]   If 7PM-7AM, please contact night-coverage  06/13/2022, 10:35 AM

## 2022-06-13 NOTE — Progress Notes (Signed)
Physical Therapy Treatment Patient Details Name: Logan Vazquez MRN: [id number] DOB: Mar 07, 1946 Today's Date: 06/13/2022   History of Present Illness Pt is a 75 y/o M admitted on 06/08/22 after presenting with c/o sudden onset epigastric pain & chest pain. Pt admitted for work-up. Pt underwent cardiac cath on 06/08/22. PMH: CAD with remote PCI, AAA s/p EVAR, chronic iron deficiency anemia, mild intermittent asthma, COPD, HTN, chronic back pain on narcotic dependence, glaucoma. MD assessment includes Lobar pneumonia, Afib, acute urinary retention.    PT Comments    Pt somewhat agitated due to pain and needing slight encouragement to participate w/ PT but otherwise agreeable to therapy. Pt is able to complete bed mobility with supervision. Pt is able to complete to sit to stands from varying surfaces w/ CGA but effortful due to pain. Pt able to ambulate 121ft using RW w/ CGA and cued for RW sequencing for pain management but denies it will help. Pt continues to ambulate with RW out forward and very adamant it is more comfortable for him; some minor knee buckling noted but pt able to self correct with no LOB. Pt will benefit from PT services in a SNF setting upon discharge to safely address deficits listed in patient problem list for decreased caregiver assistance and eventual return to PLOF.   Recommendations for follow up therapy are one component of a multi-disciplinary discharge planning process, led by the attending physician.  Recommendations may be updated based on patient status, additional functional criteria and insurance authorization.  Follow Up Recommendations  Skilled nursing-short term rehab (<3 hours/day) Can patient physically be transported by private vehicle: Yes   Assistance Recommended at Discharge Intermittent Supervision/Assistance  Patient can return home with the following Assistance with cooking/housework;Help with stairs or ramp for entrance;Assist for transportation;A little  help with walking and/or transfers;A little help with bathing/dressing/bathroom   Equipment Recommendations  None recommended by PT    Recommendations for Other Services       Precautions / Restrictions Precautions Precautions: Fall Restrictions Weight Bearing Restrictions: No     Mobility  Bed Mobility Overal bed mobility: Needs Assistance Bed Mobility: Supine to Sit     Supine to sit: Supervision          Transfers Overall transfer level: Needs assistance Equipment used: Rolling walker (2 wheels) Transfers: Sit to/from Stand Sit to Stand: Min guard           General transfer comment: effortful stands due to pain in L hip    Ambulation/Gait Ambulation/Gait assistance: Min guard Gait Distance (Feet): 120 Feet x1; 10 x1 Assistive device: Rolling walker (2 wheels) Gait Pattern/deviations: Step-through pattern, Decreased step length - right, Decreased step length - left, Trunk flexed Gait velocity: decreased     General Gait Details: slow steady gait; very adamant maintaining RW out in front as its more comfortable for him; minor knee buckling observed but able to self correct.   Stairs             Wheelchair Mobility    Modified Rankin (Stroke Patients Only)       Balance Overall balance assessment: Needs assistance Sitting-balance support: Bilateral upper extremity supported, Feet supported Sitting balance-Leahy Scale: Good     Standing balance support: Bilateral upper extremity supported, During functional activity Standing balance-Leahy Scale: Fair                              Cognition Arousal/Alertness: Awake/alert Behavior  During Therapy: WFL for tasks assessed/performed Overall Cognitive Status: Within Functional Limits for tasks assessed                                          Exercises General Exercises - Lower Extremity Ankle Circles/Pumps: Strengthening, Both, 10 reps Quad Sets: Strengthening,  Both, 10 reps Gluteal Sets: Strengthening, Both, 10 reps Long Arc Quad: Strengthening, Both, 10 reps (manual resistance) Heel Slides: Strengthening, Both, 10 reps (manual resistance) Hip Flexion/Marching: Seated, Strengthening, Both, 10 reps    General Comments        Pertinent Vitals/Pain Pain Assessment Pain Assessment: 0-10 Pain Score: 10-Worst pain ever Pain Location: L hip Pain Descriptors / Indicators: Throbbing, Aching, Grimacing Pain Intervention(s): Monitored during session, Premedicated before session, Repositioned    Home Living                          Prior Function            PT Goals (current goals can now be found in the care plan section) Progress towards PT goals: Progressing toward goals    Frequency    Min 2X/week      PT Plan Current plan remains appropriate    Co-evaluation              AM-PAC PT "6 Clicks" Mobility   Outcome Measure  Help needed turning from your back to your side while in a flat bed without using bedrails?: None Help needed moving from lying on your back to sitting on the side of a flat bed without using bedrails?: None Help needed moving to and from a bed to a chair (including a wheelchair)?: A Little Help needed standing up from a chair using your arms (e.g., wheelchair or bedside chair)?: A Little Help needed to walk in hospital room?: A Little Help needed climbing 3-5 steps with a railing? : A Little 6 Click Score: 20    End of Session Equipment Utilized During Treatment: Gait belt Activity Tolerance: Patient tolerated treatment well Patient left: Other (comment) (pt left in bathroom for toileting; RN notified) Nurse Communication: Mobility status PT Visit Diagnosis: Unsteadiness on feet (R26.81);Muscle weakness (generalized) (M62.81);Difficulty in walking, not elsewhere classified (R26.2);Pain Pain - Right/Left: Left Pain - part of body: Hip     Time: 5397-6734 PT Time Calculation (min) (ACUTE  ONLY): 28 min  Charges:                        Marica Otter, SPT  06/13/2022, 3:28 PM

## 2022-06-14 DIAGNOSIS — J181 Lobar pneumonia, unspecified organism: Secondary | ICD-10-CM | POA: Diagnosis not present

## 2022-06-14 MED ORDER — APIXABAN 5 MG PO TABS: 5.0000 mg | ORAL_TABLET | Freq: Two times a day (BID) | ORAL | 0 refills | Status: DC

## 2022-06-14 MED ORDER — LEVOFLOXACIN 750 MG PO TABS
750.0000 mg | ORAL_TABLET | Freq: Every day | ORAL | Status: DC
Start: 2022-06-15 — End: 2022-06-14

## 2022-06-14 MED ORDER — TAMSULOSIN HCL 0.4 MG PO CAPS: 0.4000 mg | ORAL_CAPSULE | Freq: Every day | ORAL | 1 refills | Status: DC

## 2022-06-14 MED ORDER — LEVOFLOXACIN 750 MG PO TABS: 750.0000 mg | ORAL_TABLET | Freq: Every day | ORAL | 0 refills | Status: AC

## 2022-06-14 NOTE — Care Management Important Message (Signed)
Important Message  Patient Details  Name: Logan Vazquez MRN: [id number] Date of Birth: [birth year]   Medicare Important Message Given:  Yes     Olegario Messier A Othell Diluzio 06/14/2022, 11:59 AM

## 2022-06-14 NOTE — Discharge Summary (Signed)
Physician Discharge Summary  Logan Vazquez WIO:[id number] DOB: 1946-03-29 DOA: 06/08/2022  PCP: Oswaldo Conroy, MD  Admit date: 06/08/2022 Discharge date: 06/14/2022  Admitted From: Home Disposition:  Home with home health  Recommendations for Outpatient Follow-up:  Follow up with PCP in 1-2 weeks   Home Health:Yes PT OT RN aide Equipment/Devices:None  Discharge Condition:Stable  CODE STATUS:FULL  Diet recommendation: Regular  Brief/Interim Summary:  76 year old man with past medical history of CAD, AAA status postrepair, chronic iron deficiency anemia, asthma, COPD, hypertension, chronic back pain.  He presented with sudden onset epigastric pain and chest pain.   Initially thought to be a STEMI but this was ruled out with cardiac cath showing nonobstructive lesions.  Patient had a CT scan of the chest abdomen pelvis that showed aortobifem iliac endograft repair of AAA with no leak.  2 left renal arteries with stenotic ostia.     When I saw the patient he was complaining of constipation.  I also got a bladder ultrasound that showed she was retaining urine.  I started Flomax and inserted a Foley catheter.  The Foley catheter will have to be in for a week.  We got bowel movements going with MiraLAX and lactulose.   The patient had a pneumonia seen on chest x-ray on 6/26 and started on Rocephin and Zithromax.     The patient went into atrial fibrillation in the evening of 6/26 and cardiology was reconsulted.   Physical therapy recommending rehab.   6/28.  Patient doing well overall.  Still with some symptoms of infectious process.  Remains on IV antibiotics.  6/30: Patient continues to do well from a clinical standpoint.  Afebrile.  No leukocytosis.  Infection resolving.  Stable for discharge home from medical standpoint.  Spoke to sister via phone at length today.  Sister related that patient has poor self-care and is likely suffering from early cognitive decline.  Wished me to  place patient in a nursing facility or care home.  I explained that that was her initial recommendation however patient had refused.  I had a lengthy discussion with patient on 6/29 and 6/30 explaining the recommendation was for skilled nursing facility.  Patient understood the recommendation and continues to refuse.  States that "I will do better at home".  Such we are obligated to respect his decision as he does have decision-making capability.  In preparation for going home we will order full scope of home health services including PT, OT, RN, aide.  TOC where of sisters concerns and will attempt to reach her by phone.  Will provide patient a list assisted living facilities in the area.  I agree the patient likely does suffer from poor self-care at home and likely medication nonadherence.  He will remain high risk for hospital readmission.   Discharge Diagnoses:  Principal Problem:   Lobar pneumonia (HCC) Active Problems:   New onset atrial fibrillation (HCC)   Abdominal pain   Acute urinary retention   Constipation   Chest pain   Abdominal aortic aneurysm (AAA) (HCC)   Insomnia   Anemia, unspecified   Weakness   Memory loss   Vitamin B12 deficiency   Iron deficiency anemia  Lobar pneumonia (HCC) With wheezing in the lungs chest x-ray performed shows starting of possible pneumonia.  Started Rocephin and Zithromax on 06/10/2022.  Completed 5 days in house.  Respiratory status baseline at time of discharge.  On room air.  No wheezing.  Will transition to p.o. Levaquin to complete  additional 2 days.  Atrial fibrillation (HCC) Suspect paroxysmal in nature.  Cardiology reconsulted.  Patient rate controlled not on any medications currently.  Cleared for discharge from cardiology standpoint.  Antiplatelet agents held.  Start Eliquis for VTE prevention   Acute urinary retention Resolved at time of discharge.  Successful voiding trial on 6/30.  Will discharge on Flomax 0.4 mg daily.   Outpatient follow-up.   Abdominal pain Likely secondary to urinary retention and constipation.   Had bowel movements 6/26.   Constipation Continue MiraLAX daily   Chest pain Troponins negative, cardiac cath negative.   Abdominal aortic aneurysm (AAA) (HCC) CT scan does not show any abdominal aortic aneurysm leak.   Insomnia Continue trazodone at night.  Ensure pain control.  Added as needed melatonin   Iron deficiency anemia Last hemoglobin 9.4.  Ferritin 67.  Continue low-dose ferrous sulfate.  Vitamin B12 deficiency could be contributing.   Vitamin B12 deficiency We will give B12 IM supplementation while here.  P.o. B12 supplementation at time of discharge   Memory loss Patient's sister concerned about his living alone and his weakness and taking his medications.  Low B12 could be contributing.  Will supplement B12 and see.  Will need outpatient follow-up   Weakness Physical therapy recommending rehab.  Patient refused and wished to go home.  Full scope of home health services ordered.  Discharge Instructions  Discharge Instructions     Diet - low sodium heart healthy   Complete by: As directed    Increase activity slowly   Complete by: As directed       Allergies as of 06/14/2022       Reactions   Ibuprofen Nausea And Vomiting        Medication List     STOP taking these medications    aspirin EC 81 MG tablet   clopidogrel 75 MG tablet Commonly known as: PLAVIX       TAKE these medications    albuterol 108 (90 Base) MCG/ACT inhaler Commonly known as: VENTOLIN HFA Inhale 1-2 puffs into the lungs every 4 (four) hours as needed for wheezing or shortness of breath.   amLODipine 10 MG tablet Commonly known as: NORVASC Take 10 mg by mouth daily.   apixaban 5 MG Tabs tablet Commonly known as: ELIQUIS Take 1 tablet (5 mg total) by mouth 2 (two) times daily.   atorvastatin 40 MG tablet Commonly known as: LIPITOR Take 1 tablet (40 mg total) by  mouth daily.   B-12 2000 MCG Tabs Take 1 tablet by mouth daily.   busPIRone 15 MG tablet Commonly known as: BUSPAR Take 15 mg by mouth 2 (two) times daily.   docusate sodium 100 MG capsule Commonly known as: COLACE Take 100 mg by mouth 2 (two) times daily.   FLUoxetine 40 MG capsule Commonly known as: PROZAC Take 40 mg by mouth daily.   fluticasone 220 MCG/ACT inhaler Commonly known as: FLOVENT HFA Inhale 2 puffs into the lungs 2 (two) times daily.   hydrocortisone 2.5 % rectal cream Commonly known as: ANUSOL-HC Place 1 application rectally 2 (two) times daily. What changed:  when to take this reasons to take this   levofloxacin 750 MG tablet Commonly known as: LEVAQUIN Take 1 tablet (750 mg total) by mouth daily for 2 days. Start taking on: June 15, 2022   loperamide 2 MG capsule Commonly known as: IMODIUM Take 2 mg by mouth as needed for diarrhea or loose stools.   metoprolol tartrate 25  MG tablet Commonly known as: LOPRESSOR Take 0.5 tablets (12.5 mg total) by mouth 2 (two) times daily. What changed: how much to take   nitroGLYCERIN 0.4 MG SL tablet Commonly known as: NITROSTAT Place 0.4 mg under the tongue every 5 (five) minutes as needed for chest pain.   omeprazole 40 MG capsule Commonly known as: PRILOSEC Take 40 mg by mouth daily.   oxyCODONE-acetaminophen 10-325 MG tablet Commonly known as: PERCOCET Take 1 tablet by mouth every 8 (eight) hours as needed for pain. For chronic pain syndrome   pregabalin 75 MG capsule Commonly known as: Lyrica 75 mg qAM, 150 mg qhs   Spiriva HandiHaler 18 MCG inhalation capsule Generic drug: tiotropium 18 mcg daily.   tamsulosin 0.4 MG Caps capsule Commonly known as: FLOMAX Take 1 capsule (0.4 mg total) by mouth daily after breakfast. Start taking on: June 15, 2022   tiZANidine 4 MG tablet Commonly known as: Zanaflex Take 1 tablet (4 mg total) by mouth every 12 (twelve) hours as needed for muscle spasms.         Contact information for after-discharge care     Destination     [facility] Preferred SNF .   Service: Skilled Nursing Contact information: [address] [place] [address] [phone]                    Allergies  Allergen Reactions   Ibuprofen Nausea And Vomiting    Consultations: Cardiology   Procedures/Studies: DG Chest 2 View  Result Date: 06/10/2022 CLINICAL DATA:  Inpatient encounter for cough. EXAM: CHEST - 2 VIEW COMPARISON:  Radiographs 06/08/2022 and 03/15/2022. CT 02/18/2019 and 06/08/2022. FINDINGS: The lateral view was repeated. There is chronic elevation of the left hemidiaphragm. The heart size and mediastinal contours are stable with aortic atherosclerosis. Interval increased retrocardiac left lower lobe airspace disease without significant pleural effusion. The right lung appears clear. There is no pneumothorax. Underlying emphysematous changes are noted. Chronic lower thoracic compression deformity appears unchanged. IMPRESSION: Increased left lower lobe airspace disease which may reflect a developing infiltrate or progressive atelectasis. Recommend radiographic follow-up. Electronically Signed   By: Carey Bullocks M.D.   On: 06/10/2022 10:19   CT Angio Abd/Pel w/ and/or w/o  Result Date: 06/08/2022 CLINICAL DATA:  Aortic aneurysm, known or suspected. History of AAA s/p EVAR on 03/22/2022. EXAM: CTA ABDOMEN AND PELVIS WITHOUT AND WITH CONTRAST TECHNIQUE: Multidetector CT imaging of the abdomen and pelvis was performed using the standard protocol during bolus administration of intravenous contrast. Multiplanar reconstructed images and MIPs were obtained and reviewed to evaluate the vascular anatomy. RADIATION DOSE REDUCTION: This exam was performed according to the departmental dose-optimization program which includes automated exposure control, adjustment of the mA and/or kV according to patient size and/or use of  iterative reconstruction technique. CONTRAST:  OMNIPAQUE IOHEXOL 350 MG/ML SOLN COMPARISON:  CTA AP 03/18/2022 FINDINGS: VASCULAR Aorta: *Aorto bi-iliac endograft repair of prior AAA. Intact stent graft with well-apposed proximal and distal attachments. *Excluded aneurysm sac measures up to 6.2 x 5.6 cm. No evidence of discrete early or delayed endoleak. Celiac: Ostial atherosclerosis, otherwise patent without evidence of aneurysm, dissection, vasculitis or significant stenosis. SMA: Ostial atherosclerosis, otherwise patent without evidence of aneurysm, dissection, vasculitis or significant stenosis. Variant splanchnic anatomy with a replaced RIGHT hepatic artery. Renals: *A single RIGHT and 2 LEFT renal arteries are present. *Mild encroachment of the proximal attachment of the stent graft into the RIGHT artery ostium without  hemodynamically significant stenosis. *Stenotic origin appearance of both the LEFT renal arteries with ostial atherosclerosis and aneurysm sac thrombus at the superior LEFT renal artery and encroachment of the proximal attachment of the stent graft at inferior LEFT renal artery both resulting in severe stenoses. The LEFT renal arteries are otherwise well opacified and with symmetric nephrograms. IMA: Occluded at its origin. Opacify distal branches likely via collateralization. Pelvis: Occluded origin of the LEFT IIA unchanged from pre endograft comparison with reconstitution of the distal branches likely via collateralization. Proximal Outflow: Bilateral common femoral and visualized portions of the superficial and profunda femoral arteries are patent without evidence of aneurysm, dissection, vasculitis or significant stenosis. Veins: No obvious venous abnormality within the limitations of this arterial phase study. Review of the MIP images confirms the above findings. NON-VASCULAR Lower chest: Cardiomegaly with multivessel coronary atherosclerosis. Paraseptal emphysematous lung change  within the lung bases. Superimposed mild bibasilar atelectasis, greatest on LEFT. Hepatobiliary: No focal liver abnormality is seen. Status post cholecystectomy. No biliary dilatation. Pancreas: No pancreatic ductal dilatation or surrounding inflammatory changes. Spleen: Normal in size without focal abnormality. Adrenals/Urinary Tract: Adrenal glands are unremarkable. Dominant 5 cm LEFT inferior renal pole exophytic cyst. Additional subcentimeter RIGHT renal cortical hypodense lesions are too small characterize though likely small cysts. Kidneys are otherwise normal, without renal calculi or hydronephrosis. Bladder is mildly distended. Stomach/Bowel: Stomach is within normal limits. Appendix appears normal. No evidence of bowel wall thickening, distention, or inflammatory changes. Lymphatic: No enlarged abdominal or pelvic lymph nodes. Reproductive: Prostatomegaly measuring 5.1 cm Other: No abdominal wall hernia or abnormality. No abdominopelvic ascites. Musculoskeletal: LEFT proximal femoral fixation. Chronic T10 compression deformity with near vertebra plana and 75% loss of height. L3-S1 posterior decompression and fusion. No acute osseous findings. No follow-up is indicated for incidental findings, unless specifically mentioned. IMPRESSION: VASCULAR 1. Aorto bi-iliac endograft repair of prior AAA. No evidence of acute vascular complication or discrete early or delayed endoleak. 2. Two LEFT renal arteries with stenotic ostia, as above. The arteries are otherwise well opacified with symmetric nephrograms. 3. Chronically occluded LEFT IIA origin, similar to pre endograft CTA with reconstitution of distal branches via collateral collateral NON-VASCULAR No acute abdominopelvic process. Additional incidental, chronic and senescent findings as above. Roanna Banning, MD [facility] Electronically Signed   By: Roanna Banning M.D.   On: 06/08/2022 11:53   CARDIAC  CATHETERIZATION  Result Date: 06/08/2022   Prox RCA lesion is 20% stenosed.   Ost LM lesion is 50% stenosed.   Mid LAD lesion is 50% stenosed.   The left ventricular systolic function is normal.   LV end diastolic pressure is moderately elevated.   The left ventricular ejection fraction is 55-65% by visual estimate.   There is no aortic valve stenosis.   There is no mitral valve regurgitation. Conclusion: Mild to moderate diffuse coronary disease including 50% ostial LMCA stenosis, 50% mid LAD stenosis, and 20% in-stent restenosis of proximal RCA stent. No obstructive lesions discovered to explain the patient's severe chest and abdominal pain. Elevated LVEDP at 27 mmHg Normal LV systolic function estimated at 55 to 60% Recommendations: Aspirin 81 mg indefinitely.  Continue Plavix as currently prescribed by vascular surgery for recent endovascular repair of AAA. Consider alternative causes of chest, back, abdominal pain. We will hand patient off to [facility] provider who recently has been caring for this patient.   DG Chest Port 1 View  Result Date: 06/08/2022 CLINICAL DATA:  Chest pains EXAM:  PORTABLE CHEST 1 VIEW COMPARISON:  03/15/2022.  10/05/2011 FINDINGS: 0721 hours. The cardio pericardial silhouette is enlarged. Soft tissue fullness noted in the right hilar region is similar to prior. Interstitial markings are diffusely coarsened with chronic features. Left base atelectasis noted. No substantial pleural effusion overt airspace pulmonary edema. Telemetry leads overlie the chest. IMPRESSION: 1. Chronic interstitial changes with left base atelectasis. 2. Soft tissue fullness in the right hilar region, as before and likely related to vascular anatomy. Electronically Signed   By: Kennith Center M.D.   On: 06/08/2022 07:49      Subjective: Patient seen and examined on the day of discharge.  Lengthy discussion regarding recommendations for skilled nursing facility.  Patient is clear and his refusal.  He states  that he will do better at home.  Discharge Exam: Vitals:   06/14/22 0718 06/14/22 0859  BP:  104/68  Pulse:  71  Resp:  16  Temp:  97.8 F (36.6 C)  SpO2: 95% 97%   Vitals:   06/13/22 2047 06/14/22 0525 06/14/22 0718 06/14/22 0859  BP: 110/69 94/63  104/68  Pulse: 79 75  71  Resp: Temp: 98.9 F (37.2 C) 98 F (36.7 C)  97.8 F (36.6 C)  TempSrc:      SpO2: 95% 98% 95% 97%  Weight:      Height:        General: Pt is alert, awake, not in acute distress Cardiovascular: RRR, S1/S2 +, no rubs, no gallops Respiratory: CTA bilaterally, no wheezing, no rhonchi Abdominal: Soft, NT, ND, bowel sounds + Extremities: no edema, no cyanosis    The results of significant diagnostics from this hospitalization (including imaging, microbiology, ancillary and laboratory) are listed below for reference.     Microbiology: Recent Results (from the past 240 hour(s))  Resp Panel by RT-PCR (Flu A&B, Covid) Anterior Nasal Swab     Status: None   Collection Time: 06/08/22  6:53 AM   Specimen: Anterior Nasal Swab  Result Value Ref Range Status   SARS Coronavirus 2 by RT PCR NEGATIVE NEGATIVE Final    Comment: (NOTE) SARS-CoV-2 target nucleic acids are NOT DETECTED.  The SARS-CoV-2 RNA is generally detectable in upper respiratory specimens during the acute phase of infection. The lowest concentration of SARS-CoV-2 viral copies this assay can detect is 138 copies/mL. A negative result does not preclude SARS-Cov-2 infection and should not be used as the sole basis for treatment or other patient management decisions. A negative result may occur with  improper specimen collection/handling, submission of specimen other than nasopharyngeal swab, presence of viral mutation(s) within the areas targeted by this assay, and inadequate number of viral copies(<138 copies/mL). A negative result must be combined with clinical observations, patient history, and epidemiological information.  The expected result is Negative.  Fact Sheet for Patients:  BloggerCourse.com  Fact Sheet for Healthcare Providers:  SeriousBroker.it  This test is no t yet approved or cleared by the [place] FDA and  has been authorized for detection and/or diagnosis of SARS-CoV-2 by FDA under an Emergency Use Authorization (EUA). This EUA will remain  in effect (meaning this test can be used) for the duration of the COVID-19 declaration under Section 564(b)(1) of the Act, 21 U.S.C.section 360bbb-3(b)(1), unless the authorization is terminated  or revoked sooner.       Influenza A by PCR NEGATIVE NEGATIVE Final   Influenza B by PCR NEGATIVE NEGATIVE Final    Comment: (NOTE) The Xpert  Xpress SARS-CoV-2/FLU/RSV plus assay is intended as an aid in the diagnosis of influenza from Nasopharyngeal swab specimens and should not be used as a sole basis for treatment. Nasal washings and aspirates are unacceptable for Xpert Xpress SARS-CoV-2/FLU/RSV testing.  Fact Sheet for Patients: BloggerCourse.com  Fact Sheet for Healthcare Providers: SeriousBroker.it  This test is not yet approved or cleared by the [place] FDA and has been authorized for detection and/or diagnosis of SARS-CoV-2 by FDA under an Emergency Use Authorization (EUA). This EUA will remain in effect (meaning this test can be used) for the duration of the COVID-19 declaration under Section 564(b)(1) of the Act, 21 U.S.C. section 360bbb-3(b)(1), unless the authorization is terminated or revoked.  Performed at [facility], [address] [place], [place] [address]   MRSA Next Gen by PCR, Nasal     Status: None   Collection Time: 06/08/22  9:15 AM   Specimen: Nasal Mucosa; Nasal Swab  Result Value Ref Range Status   MRSA by PCR Next Gen NOT DETECTED NOT DETECTED Final    Comment: (NOTE) The GeneXpert MRSA Assay  (FDA approved for NASAL specimens only), is one component of a comprehensive MRSA colonization surveillance program. It is not intended to diagnose MRSA infection nor to guide or monitor treatment for MRSA infections. Test performance is not FDA approved in patients less than 81 years old. Performed at [facility], [address] [place], [place] [address]      Labs: BNP (last 3 results) No results for input(s): "BNP" in the last 8760 hours. Basic Metabolic Panel: Recent Labs  Lab 06/08/22 0653 06/09/22 0415 06/10/22 0235 06/11/22 0510 06/13/22 0757  NA 136 135 135  --  138  K 4.1 4.5 3.6  --  4.2  CL 107 106 108  --  107  CO2 24 21* 23  --  23  GLUCOSE 115* 96 133*  --  90  BUN 20 20 12   --  15  CREATININE 1.01 0.89 0.78  --  0.71  CALCIUM 8.9 8.6* 8.2*  --  8.8*  MG  --   --   --  2.0 2.1   Liver Function Tests: Recent Labs  Lab 06/08/22 0653  AST 19  ALT 16  ALKPHOS 80  BILITOT 0.3  PROT 7.4  ALBUMIN 4.0   Recent Labs  Lab 06/08/22 0653  LIPASE 25   No results for input(s): "AMMONIA" in the last 168 hours. CBC: Recent Labs  Lab 06/08/22 0653 06/09/22 0415 06/10/22 0235 06/11/22 0510 06/13/22 0757  WBC 10.9* 10.9* 6.7  --  6.9  HGB 11.3* 10.5* 8.9* 9.4* 11.4*  HCT 35.5* 33.0* 26.8*  --  35.0*  MCV 94.4 95.1 91.8  --  90.9  PLT 358 325 237  --  335   Cardiac Enzymes: No results for input(s): "CKTOTAL", "CKMB", "CKMBINDEX", "TROPONINI" in the last 168 hours. BNP: Invalid input(s): "POCBNP" CBG: Recent Labs  Lab 06/08/22 0913  GLUCAP 106*   D-Dimer No results for input(s): "DDIMER" in the last 72 hours. Hgb A1c No results for input(s): "HGBA1C" in the last 72 hours. Lipid Profile No results for input(s): "CHOL", "HDL", "LDLCALC", "TRIG", "CHOLHDL", "LDLDIRECT" in the last 72 hours. Thyroid function studies No results for input(s): "TSH", "T4TOTAL", "T3FREE", "THYROIDAB" in the last 72 hours.  Invalid input(s):  "FREET3" Anemia work up No results for input(s): "VITAMINB12", "FOLATE", "FERRITIN", "TIBC", "IRON", "RETICCTPCT" in the last 72 hours. Urinalysis    Component Value Date/Time  COLORURINE STRAW (A) 03/18/2022 1737   APPEARANCEUR CLEAR (A) 03/18/2022 1737   LABSPEC 1.033 (H) 03/18/2022 1737   PHURINE 8.0 03/18/2022 1737   GLUCOSEU NEGATIVE 03/18/2022 1737   HGBUR NEGATIVE 03/18/2022 1737   BILIRUBINUR NEGATIVE 03/18/2022 1737   KETONESUR NEGATIVE 03/18/2022 1737   PROTEINUR NEGATIVE 03/18/2022 1737   NITRITE NEGATIVE 03/18/2022 1737   LEUKOCYTESUR NEGATIVE 03/18/2022 1737   Sepsis Labs Recent Labs  Lab 06/08/22 0653 06/09/22 0415 06/10/22 0235 06/13/22 0757  WBC 10.9* 10.9* 6.7 6.9   Microbiology Recent Results (from the past 240 hour(s))  Resp Panel by RT-PCR (Flu A&B, Covid) Anterior Nasal Swab     Status: None   Collection Time: 06/08/22  6:53 AM   Specimen: Anterior Nasal Swab  Result Value Ref Range Status   SARS Coronavirus 2 by RT PCR NEGATIVE NEGATIVE Final    Comment: (NOTE) SARS-CoV-2 target nucleic acids are NOT DETECTED.  The SARS-CoV-2 RNA is generally detectable in upper respiratory specimens during the acute phase of infection. The lowest concentration of SARS-CoV-2 viral copies this assay can detect is 138 copies/mL. A negative result does not preclude SARS-Cov-2 infection and should not be used as the sole basis for treatment or other patient management decisions. A negative result may occur with  improper specimen collection/handling, submission of specimen other than nasopharyngeal swab, presence of viral mutation(s) within the areas targeted by this assay, and inadequate number of viral copies(<138 copies/mL). A negative result must be combined with clinical observations, patient history, and epidemiological information. The expected result is Negative.  Fact Sheet for Patients:  BloggerCourse.com  Fact Sheet for  Healthcare Providers:  SeriousBroker.it  This test is no t yet approved or cleared by the [place] FDA and  has been authorized for detection and/or diagnosis of SARS-CoV-2 by FDA under an Emergency Use Authorization (EUA). This EUA will remain  in effect (meaning this test can be used) for the duration of the COVID-19 declaration under Section 564(b)(1) of the Act, 21 U.S.C.section 360bbb-3(b)(1), unless the authorization is terminated  or revoked sooner.       Influenza A by PCR NEGATIVE NEGATIVE Final   Influenza B by PCR NEGATIVE NEGATIVE Final    Comment: (NOTE) The Xpert Xpress SARS-CoV-2/FLU/RSV plus assay is intended as an aid in the diagnosis of influenza from Nasopharyngeal swab specimens and should not be used as a sole basis for treatment. Nasal washings and aspirates are unacceptable for Xpert Xpress SARS-CoV-2/FLU/RSV testing.  Fact Sheet for Patients: BloggerCourse.com  Fact Sheet for Healthcare Providers: SeriousBroker.it  This test is not yet approved or cleared by the [place] FDA and has been authorized for detection and/or diagnosis of SARS-CoV-2 by FDA under an Emergency Use Authorization (EUA). This EUA will remain in effect (meaning this test can be used) for the duration of the COVID-19 declaration under Section 564(b)(1) of the Act, 21 U.S.C. section 360bbb-3(b)(1), unless the authorization is terminated or revoked.  Performed at [facility], [address] [place], [place] [address]   MRSA Next Gen by PCR, Nasal     Status: None   Collection Time: 06/08/22  9:15 AM   Specimen: Nasal Mucosa; Nasal Swab  Result Value Ref Range Status   MRSA by PCR Next Gen NOT DETECTED NOT DETECTED Final    Comment: (NOTE) The GeneXpert MRSA Assay (FDA approved for NASAL specimens only), is one component of a comprehensive MRSA colonization surveillance program. It is  not intended to diagnose MRSA infection  nor to guide or monitor treatment for MRSA infections. Test performance is not FDA approved in patients less than 65 years old. Performed at [facility], [address], [place], [place] [address]      Time coordinating discharge: Over 30 minutes  SIGNED:   Tresa Moore, MD  [facility] 06/14/2022, 2:46 PM Pager   If 7PM-7AM, please contact night-coverage

## 2022-06-14 NOTE — TOC Progression Note (Addendum)
Transition of Care [facility]) - Progression Note    Patient Details  Name: Logan Vazquez MRN: [id number] Date of Birth: Oct 18, 1946  [facility] CM/SW Contact  Maree Krabbe, LCSW Phone Number: 06/14/2022, 9:18 AM  Clinical Narrative:   Pt is refusing SNF prefers HH. HH set up through [facility].  Taxi voucher printed to unit for RN when pt medically stable-anticipated dc today.   Expected Discharge Plan: Skilled Nursing Facility Barriers to Discharge: Continued Medical Work up  Expected Discharge Plan and Services Expected Discharge Plan: Skilled Nursing Facility       Living arrangements for the past 2 months: Single Family Home                                       Social Determinants of Health (SDOH) Interventions    Readmission Risk Interventions     No data to display

## 2022-06-14 NOTE — Progress Notes (Signed)
Occupational Therapy Treatment Patient Details Name: Logan Vazquez MRN: [id number] DOB: [birth year] Today's Date: 06/14/2022   History of present illness Pt is a 76 y/o M admitted on 06/08/22 after presenting with c/o sudden onset epigastric pain & chest pain. Pt admitted for work-up. Pt underwent cardiac cath on 06/08/22. PMH: CAD with remote PCI, AAA s/p EVAR, chronic iron deficiency anemia, mild intermittent asthma, COPD, HTN, chronic back pain on narcotic dependence, glaucoma. MD assessment includes Lobar pneumonia, Afib, acute urinary retention.   OT comments  Logan Vazquez was seen for OT treatment on this date. Upon arrival to room pt seated EOB, agreeable to tx. Pt requires SUPERVISION + RW sit<>stand, ~15 ft mobility, and standing grooming tasks decreasing to CGA as pt fatigues - endorses minimal LLE buckling howver no LOBs. SBA + single UE support cleaning counter. SBA + RW transporting items to laundry. Pt making good progress toward goals, will continue to follow POC. Discharge recommendation updated to reflect pt progress, states now at functional baseline. Pt reports aid for cleaning, recommend assist for IADLs.     Recommendations for follow up therapy are one component of a multi-disciplinary discharge planning process, led by the attending physician.  Recommendations may be updated based on patient status, additional functional criteria and insurance authorization.    Follow Up Recommendations  Home health OT    Assistance Recommended at Discharge Intermittent Supervision/Assistance  Patient can return home with the following  A little help with walking and/or transfers;A little help with bathing/dressing/bathroom;Assistance with cooking/housework   Equipment Recommendations  None recommended by OT    Recommendations for Other Services      Precautions / Restrictions Precautions Precautions: Fall Restrictions Weight Bearing Restrictions: No       Mobility Bed Mobility                General bed mobility comments: NT    Transfers Overall transfer level: Needs assistance Equipment used: Rolling walker (2 wheels) Transfers: Sit to/from Stand Sit to Stand: Supervision                 Balance Overall balance assessment: Needs assistance Sitting-balance support: No upper extremity supported, Feet supported Sitting balance-Logan Vazquez: Good     Standing balance support: No upper extremity supported, During functional activity Standing balance-Logan Vazquez: Fair                             ADL either performed or assessed with clinical judgement   ADL Overall ADL's : Needs assistance/impaired                                       General ADL Comments: SUPERVISION + RW for ADL t/f and standing grooming tasks decreasing to CGA as pt fatigues - endorses minimal LLE buckling howver no LOBs. SBA + single UE support cleaning counter. SBA + RW transporting items to laundry.      Cognition Arousal/Alertness: Awake/alert Behavior During Therapy: WFL for tasks assessed/performed Overall Cognitive Status: Within Functional Limits for tasks assessed                                                     Pertinent Vitals/ Pain  Pain Assessment Pain Assessment: 0-10 Pain Score: 9  Pain Location: L hip Pain Descriptors / Indicators: Throbbing, Aching, Grimacing Pain Intervention(s): Limited activity within patient's tolerance, Patient requesting pain meds-RN notified   Frequency  Min 2X/week        Progress Toward Goals  OT Goals(current goals can now be found in the care plan section)  Progress towards OT goals: Progressing toward goals  Acute Rehab OT Goals Patient Stated Goal: to go home OT Goal Formulation: With patient Time For Goal Achievement: 06/25/22 Potential to Achieve Goals: Good ADL Goals Pt Will Perform Grooming: with modified independence;standing Pt Will Perform  Lower Body Dressing: with modified independence;sitting/lateral leans Pt Will Transfer to Toilet: regular height toilet;with modified independence  Plan Discharge plan needs to be updated;Frequency remains appropriate    Co-evaluation                 AM-PAC OT "6 Clicks" Daily Activity     Outcome Measure   Help from another person eating meals?: None Help from another person taking care of personal grooming?: A Little Help from another person toileting, which includes using toliet, bedpan, or urinal?: A Little Help from another person bathing (including washing, rinsing, drying)?: A Little Help from another person to put on and taking off regular upper body clothing?: None Help from another person to put on and taking off regular lower body clothing?: A Little 6 Click Score: 20    End of Session Equipment Utilized During Treatment: Rolling walker (2 wheels)  OT Visit Diagnosis: Unsteadiness on feet (R26.81);Muscle weakness (generalized) (M62.81);Pain   Activity Tolerance Patient tolerated treatment well   Patient Left in bed;with call bell/phone within reach;with bed alarm set;with nursing/sitter in room   Nurse Communication Mobility status        Time: 4270-6237 OT Time Calculation (min): 16 min  Charges: OT General Charges $OT Visit: 1 Visit OT Treatments $Self Care/Home Management : 8-22 mins  Kathie Dike, M.S. OTR/L  06/14/22, 11:37 AM  ascom [phone]

## 2022-06-14 NOTE — Progress Notes (Signed)
Physical Therapy Treatment Patient Details Name: Logan Vazquez MRN: [id number] DOB: December 19, 1945 Today's Date: 06/14/2022   History of Present Illness Pt is a 76 y/o M admitted on 06/08/22 after presenting with c/o sudden onset epigastric pain & chest pain. Pt admitted for work-up. Pt underwent cardiac cath on 06/08/22. PMH: CAD with remote PCI, AAA s/p EVAR, chronic iron deficiency anemia, mild intermittent asthma, COPD, HTN, chronic back pain on narcotic dependence, glaucoma. MD assessment includes Lobar pneumonia, Afib, acute urinary retention.    PT Comments    Pt was pleasant and motivated to participate during the session and put forth good effort throughout. Pt able to complete bed mobility w/ supervision. Pt is able to complete sit to stands w/ CGA with extra effort and time to complete. Pt was able to complete stairs x4 w/ CGA using L rail following cuing for sequencing; slow and effortful w/ no LOB. Pt was able to ambulate 10ft using RW w/ CGA; continues to maintain RW out in front despite cuing to keep close. Minor knee buckling noted but pt is able to self correct. Pt will benefit from PT services in a SNF setting upon discharge to safely address deficits listed in patient problem list for decreased caregiver assistance and eventual return to PLOF.    Recommendations for follow up therapy are one component of a multi-disciplinary discharge planning process, led by the attending physician.  Recommendations may be updated based on patient status, additional functional criteria and insurance authorization.  Follow Up Recommendations  Skilled nursing-short term rehab (<3 hours/day) Can patient physically be transported by private vehicle: Yes   Assistance Recommended at Discharge Intermittent Supervision/Assistance  Patient can return home with the following Assistance with cooking/housework;Help with stairs or ramp for entrance;Assist for transportation;A little help with walking and/or  transfers;A little help with bathing/dressing/bathroom   Equipment Recommendations  None recommended by PT    Recommendations for Other Services       Precautions / Restrictions Precautions Precautions: Fall Restrictions Weight Bearing Restrictions: No     Mobility  Bed Mobility Overal bed mobility: Needs Assistance Bed Mobility: Supine to Sit, Sit to Supine     Supine to sit: Supervision Sit to supine: Supervision        Transfers Overall transfer level: Needs assistance Equipment used: Rolling walker (2 wheels) Transfers: Sit to/from Stand Sit to Stand: Min guard           General transfer comment: extra time and effort to complete    Ambulation/Gait Ambulation/Gait assistance: Min guard Gait Distance (Feet): 60 Feet Assistive device: Rolling walker (2 wheels) Gait Pattern/deviations: Step-through pattern, Decreased step length - right, Decreased step length - left, Trunk flexed Gait velocity: decreased     General Gait Details: slow steady gait; maintains RW out in front despite correction   Stairs Stairs: Yes Stairs assistance: Min guard Stair Management: One rail Left, Step to pattern, Forwards Number of Stairs: 4 General stair comments: verbal cuing for sequencing; very effortful but no LOB   Wheelchair Mobility    Modified Rankin (Stroke Patients Only)       Balance Overall balance assessment: Needs assistance Sitting-balance support: Bilateral upper extremity supported, Feet supported Sitting balance-Leahy Scale: Good     Standing balance support: Bilateral upper extremity supported, During functional activity Standing balance-Leahy Scale: Fair                              Cognition  Arousal/Alertness: Awake/alert Behavior During Therapy: WFL for tasks assessed/performed Overall Cognitive Status: Within Functional Limits for tasks assessed                                          Exercises Other  Exercises Other Exercises: Pt education on sequencing for stairs for pain management    General Comments        Pertinent Vitals/Pain Pain Assessment Pain Assessment: 0-10 Pain Score: 9  Pain Location: L hip Pain Descriptors / Indicators: Throbbing, Aching, Grimacing Pain Intervention(s): Monitored during session, Premedicated before session, Repositioned    Home Living                          Prior Function            PT Goals (current goals can now be found in the care plan section) Progress towards PT goals: Progressing toward goals    Frequency    Min 2X/week      PT Plan Current plan remains appropriate    Co-evaluation              AM-PAC PT "6 Clicks" Mobility   Outcome Measure  Help needed turning from your back to your side while in a flat bed without using bedrails?: None Help needed moving from lying on your back to sitting on the side of a flat bed without using bedrails?: None Help needed moving to and from a bed to a chair (including a wheelchair)?: A Little Help needed standing up from a chair using your arms (e.g., wheelchair or bedside chair)?: A Little Help needed to walk in hospital room?: A Little Help needed climbing 3-5 steps with a railing? : A Little 6 Click Score: 20    End of Session Equipment Utilized During Treatment: Gait belt Activity Tolerance: Patient tolerated treatment well Patient left: in bed;with call bell/phone within reach;with bed alarm set Nurse Communication: Mobility status PT Visit Diagnosis: Unsteadiness on feet (R26.81);Muscle weakness (generalized) (M62.81);Difficulty in walking, not elsewhere classified (R26.2);Pain Pain - Right/Left: Left Pain - part of body: Hip     Time: 7902-4097 PT Time Calculation (min) (ACUTE ONLY): 23 min  Charges:                        Marica Otter, SPT  06/14/2022, 4:03 PM

## 2022-06-26 ENCOUNTER — Encounter: Payer: Self-pay | Admitting: Student in an Organized Health Care Education/Training Program

## 2022-06-26 ENCOUNTER — Ambulatory Visit
Payer: Medicare HMO | Attending: Student in an Organized Health Care Education/Training Program | Admitting: Student in an Organized Health Care Education/Training Program

## 2022-06-26 VITALS — BP 130/62 | HR 58 | Temp 98.1°F | Resp 16 | Ht 69.0 in | Wt 108.0 lb

## 2022-06-26 DIAGNOSIS — M47894 Other spondylosis, thoracic region: Secondary | ICD-10-CM | POA: Diagnosis present

## 2022-06-26 DIAGNOSIS — Z79899 Other long term (current) drug therapy: Secondary | ICD-10-CM | POA: Insufficient documentation

## 2022-06-26 DIAGNOSIS — G8929 Other chronic pain: Secondary | ICD-10-CM | POA: Diagnosis present

## 2022-06-26 DIAGNOSIS — Z8673 Personal history of transient ischemic attack (TIA), and cerebral infarction without residual deficits: Secondary | ICD-10-CM | POA: Insufficient documentation

## 2022-06-26 DIAGNOSIS — G894 Chronic pain syndrome: Secondary | ICD-10-CM | POA: Diagnosis present

## 2022-06-26 DIAGNOSIS — M25552 Pain in left hip: Secondary | ICD-10-CM | POA: Diagnosis present

## 2022-06-26 DIAGNOSIS — Z0289 Encounter for other administrative examinations: Secondary | ICD-10-CM | POA: Insufficient documentation

## 2022-06-26 DIAGNOSIS — M7062 Trochanteric bursitis, left hip: Secondary | ICD-10-CM | POA: Diagnosis present

## 2022-06-26 DIAGNOSIS — M47812 Spondylosis without myelopathy or radiculopathy, cervical region: Secondary | ICD-10-CM | POA: Insufficient documentation

## 2022-06-26 DIAGNOSIS — M48061 Spinal stenosis, lumbar region without neurogenic claudication: Secondary | ICD-10-CM | POA: Insufficient documentation

## 2022-06-26 MED ORDER — OXYCODONE-ACETAMINOPHEN 10-325 MG PO TABS: 1.0000 | ORAL_TABLET | Freq: Three times a day (TID) | ORAL | 0 refills | Status: DC | PRN

## 2022-06-26 NOTE — Progress Notes (Signed)
Nursing Pain Medication Assessment:  Safety precautions to be maintained throughout the outpatient stay will include: orient to surroundings, keep bed in low position, maintain call bell within reach at all times, provide assistance with transfer out of bed and ambulation.  Medication Inspection Compliance: Pill count conducted under aseptic conditions, in front of the patient. Neither the pills nor the bottle was removed from the patient's sight at any time. Once count was completed pills were immediately returned to the patient in their original bottle.  Medication: Oxycodone/APAP Pill/Patch Count:  0 of 90 pills remain Pill/Patch Appearance:  no pills to assess Bottle Appearance: Standard pharmacy container. Clearly labeled. Filled Date: 05 / 18 / 2023 Last Medication intake:   06/20/2022

## 2022-06-26 NOTE — Progress Notes (Signed)
PROVIDER NOTE: Information contained herein reflects review and annotations entered in association with encounter. Interpretation of such information and data should be left to medically-trained personnel. Information provided to patient can be located elsewhere in the medical record under "Patient Instructions". Document created using STT-dictation technology, any transcriptional errors that may result from process are unintentional.    Patient: Logan Vazquez  Service Category: E/M  Provider: Gillis Santa, MD  DOB: [birth year]  DOS: 06/26/2022  Specialty: Interventional Pain Management  MRN: [id number]  Setting: Ambulatory outpatient  PCP: Letta Median, MD  Type: Established Patient    Referring Provider: Letta Median, MD  Location: Office  Delivery: Face-to-face     HPI  [name], a 76 y.o. year old male, is here today because of his Trochanteric bursitis of left hip [M70.62]. [name] primary complain today is Back Pain (lower)  Last encounter: My last encounter with him was on 02/28/22  Pertinent problems: [name] has Anxiety disorder; Atherosclerotic heart disease of native coronary artery without angina pectoris; History of lumbar spinal fusion; Lumbar degenerative disc disease; Recurrent major depressive disorder, in partial remission (Logan Vazquez); Chronic pain syndrome; Long term current use of opiate analgesic; Chronic bilateral low back pain with bilateral sciatica; Chronic hip pain, left; and Spinal stenosis of lumbar region on their pertinent problem list. Pain Assessment: Severity of Chronic pain is reported as a 9 /10. Location: Back Lower/to hips bilat. Onset: More than a month ago. Quality: Aching. Timing: Constant. Modifying factor(s): meds. Vitals:  height is _0  (1.753 m) and weight is 108 lb (49 kg). His temporal temperature is 98.1 F (36.7 C). His blood pressure is 130/62 and his pulse is 58 (abnormal). His respiration is 16 and oxygen saturation is 98%.    Reason for encounter: medication management.    Patient presents today for medication management.  He is having increased left hip pain overlying his left trochanteric bursa.  He has a history of left hip surgery.  He states that his pain is worse with weightbearing.  He has tried to perform some exercises over the last couple of weeks at home but the pain has been excruciating.  We discussed a left greater trochanteric bursa injection under fluoroscopy for pain relief.  Risk and benefits reviewed and patient would like to proceed.  He is on Eliquis but he does not need to stop for an extraspinal procedure such as this.  He states that he ran out of his medications on the sixth as he was taking slightly more given increased left hip pain.  I advised against this informed him to have contacted me so we could have discussed other options as opposed to escalation of his opioid therapy.  Patient endorsed understanding.  Pharmacotherapy Assessment  Analgesic: Percocet 10 mg 3 times daily as needed, quantity 90/month; MME equals 45    Monitoring: [place] PMP: PDMP reviewed during this encounter.       Pharmacotherapy: No side-effects or adverse reactions reported. Compliance: No problems identified. Effectiveness: Clinically acceptable.  UDS:  Summary  Date Value Ref Range Status  01/05/2019 FINAL  Final    Comment:    ==================================================================== TOXASSURE SELECT 13 (MW) ==================================================================== Test                             Result       Flag       Units Drug Present and Declared for Prescription  Verification   Oxycodone                      1254         EXPECTED   ng/mg creat   Oxymorphone                    310          EXPECTED   ng/mg creat   Noroxycodone                   4592         EXPECTED   ng/mg creat   Noroxymorphone                 247          EXPECTED   ng/mg creat    Sources of oxycodone are  scheduled prescription medications.    Oxymorphone, noroxycodone, and noroxymorphone are expected    metabolites of oxycodone. Oxymorphone is also available as a    scheduled prescription medication. Drug Present not Declared for Prescription Verification   Alprazolam                     15           UNEXPECTED ng/mg creat   Alpha-hydroxyalprazolam        19           UNEXPECTED ng/mg creat    Source of alprazolam is a scheduled prescription medication.    Alpha-hydroxyalprazolam is an expected metabolite of alprazolam. ==================================================================== Test                      Result    Flag   Units      Ref Range   Creatinine              150              mg/dL      >=20 ==================================================================== Declared Medications:  The flagging and interpretation on this report are based on the  following declared medications.  Unexpected results may arise from  inaccuracies in the declared medications.  **Note: The testing scope of this panel includes these medications:  Oxycodone (Oxycodone Acetaminophen)  **Note: The testing scope of this panel does not include following  reported medications:  Acetaminophen (Oxycodone Acetaminophen)  Albuterol  Aspirin (Aspirin 81)  Atorvastatin  Baclofen  Celecoxib  Cholecalciferol  Cyanocobalamin  Docusate  Fluoxetine  Hydrocortisone  Lisinopril  Mupirocin  Nitroglycerin  Pregabalin ==================================================================== For clinical consultation, please call [phone]. ====================================================================       ROS  Constitutional: Denies any fever or chills Gastrointestinal: No reported hemesis, hematochezia, vomiting, or acute GI distress Musculoskeletal: Left greater trochanteric bursa Neurological: No reported episodes of acute onset apraxia, aphasia, dysarthria, agnosia, amnesia, paralysis,  loss of coordination, or loss of consciousness  Medication Review  B-12, FLUoxetine, albuterol, amLODipine, apixaban, atorvastatin, busPIRone, docusate sodium, fluticasone, hydrocortisone, loperamide, metoprolol tartrate, nitroGLYCERIN, omeprazole, oxyCODONE-acetaminophen, pregabalin, tamsulosin, tiZANidine, and tiotropium  History Review  Allergy: Mr. Chirico is allergic to ibuprofen. Drug: Mr. Murata  reports no history of drug use. Alcohol:  reports no history of alcohol use. Tobacco:  reports that he has been smoking cigarettes. He has a 29.00 pack-year smoking history. He has never used smokeless tobacco. Social: Mr. Medlock  reports that he has been smoking cigarettes. He has a 29.00 pack-year smoking history. He has never used  smokeless tobacco. He reports that he does not drink alcohol and does not use drugs. Medical:  has a past medical history of Arthritis, Asthma, CAD (coronary artery disease), Depression, Emphysema lung ([place]), Glaucoma, and Hypertension. Surgical: Mr. Ferrufino  has a past surgical history that includes heart surgery (07/1999); Hip surgery (Left, 1968); Back surgery (1985); Skin graft (1968); Humerus fracture surgery (1968); ENDOVASCULAR REPAIR/STENT GRAFT (N/A, 03/22/2022); Coronary/Graft Acute MI Revascularization (N/A, 06/08/2022); and LEFT HEART CATH AND CORONARY ANGIOGRAPHY (N/A, 06/08/2022). Family: family history includes Cancer in his father; Hearing loss in his mother; Heart attack in his brother; Heart disease in his brother; Hypertension in his father and mother; Stroke in his sister.  Laboratory Chemistry Profile   Renal Lab Results  Component Value Date   BUN 15 06/13/2022   CREATININE 0.71 81/09/3158   BCR NOT APPLICABLE 45/85/9292   GFRNONAA >60 06/13/2022     Hepatic Lab Results  Component Value Date   AST 19 06/08/2022   ALT 16 06/08/2022   ALBUMIN 4.0 06/08/2022   ALKPHOS 80 06/08/2022   LIPASE 25 06/08/2022     Electrolytes Lab Results   Component Value Date   NA 138 06/13/2022   K 4.2 06/13/2022   CL 107 06/13/2022   CALCIUM 8.8 (L) 06/13/2022   MG 2.1 06/13/2022     Bone No results found for: "VD25OH", "VD125OH2TOT", "KM6286NO1", "RR1165BX0", "25OHVITD1", "25OHVITD2", "25OHVITD3", "TESTOFREE", "TESTOSTERONE"   Inflammation (CRP: Acute Phase) (ESR: Chronic Phase) No results found for: "CRP", "ESRSEDRATE", "LATICACIDVEN"     Note: Above Lab results reviewed.   Physical Exam  General appearance: Well nourished, well developed, and well hydrated. In no apparent acute distress Mental status: Alert, oriented x 3 (person, place, & time)       Respiratory: No evidence of acute respiratory distress Eyes: PERLA Vitals: BP 130/62   Pulse (!) 58   Temp 98.1 F (36.7 C) (Temporal)   Resp 16   Ht _0  (1.753 m)   Wt 108 lb (49 kg)   SpO2 98%   BMI 15.95 kg/m  BMI: Estimated body mass index is 15.95 kg/m as calculated from the following:   Height as of this encounter: _1  (1.753 m).   Weight as of this encounter: 108 lb (49 kg). Ideal: Male patients must weigh at least 50 kg to calculate ideal body weight  Cervical Spine Area Exam  Skin & Axial Inspection: No masses, redness, edema, swelling, or associated skin lesions Alignment: Symmetrical Functional ROM: Pain restricted ROM      Stability: No instability detected Muscle Tone/Strength: Functionally intact. No obvious neuro-muscular anomalies detected. Sensory (Neurological): Musculoskeletal pain pattern Palpation: No palpable anomalies               Lumbar Spine Area Exam  Skin & Axial Inspection:  Well healed scar from prior surgery Alignment: Symmetrical Functional ROM: Pain restricted ROM affecting both sides Stability: No instability detected Muscle Tone/Strength: Functionally intact. No obvious neuro-muscular anomalies detected. Sensory (Neurological): MSK and dermatomal   Gait & Posture Assessment  Ambulation: Limited Gait: Significantly  limited. Dependent on assistive device to ambulate Posture: Difficulty standing up straight, due to pain  Lower Extremity Exam      Side: Right lower extremity   Side: Left lower extremity  Stability: No instability observed           Stability: No instability observed          Skin & Extremity Inspection: Skin color, temperature, and hair growth  are WNL. No peripheral edema or cyanosis. No masses, redness, swelling, asymmetry, or associated skin lesions. No contractures.   Skin & Extremity Inspection: Skin color, temperature, and hair growth are WNL. No peripheral edema or cyanosis. No masses, redness, swelling, asymmetry, or associated skin lesions. No contractures.  Functional ROM: Decreased ROM for hip and knee joints           Functional ROM: Decreased ROM for hip and knee joints          Muscle Tone/Strength: Functionally intact. No obvious neuro-muscular anomalies detected.   Muscle Tone/Strength: Functionally intact. No obvious neuro-muscular anomalies detected.  Sensory (Neurological): Dermatomal pain pattern on the right       Sensory (Neurological): Musculoskeletal pain pattern of the left hip       DTR: Patellar: deferred today Achilles: deferred today Plantar: deferred today   DTR: Patellar: deferred today Achilles: deferred today Plantar: deferred today  Palpation: No palpable anomalies   Palpation: left GTB TTP    4 out of 5 strength bilateral lower extremity: Plantar flexion, dorsiflexion, knee flexion, knee extension.   Assessment   Status Diagnosis  Persistent Persistent Controlled 1. Trochanteric bursitis of left hip   2. Chronic hip pain, left   3. History of CVA (cerebrovascular accident)   4. Chronic pain syndrome   5. Pain medication agreement   6. Controlled substance agreement signed   7. Spinal stenosis of lumbar region, unspecified whether neurogenic claudication present   8. Cervical facet joint syndrome   9. Thoracic facet joint syndrome          Plan of Care   Mr. DUEL CONRAD has a current medication list which includes the following long-term medication(s): albuterol, amlodipine, apixaban, atorvastatin, fluoxetine, fluticasone, metoprolol tartrate, nitroglycerin, pregabalin, spiriva handihaler, oxycodone-acetaminophen, [START ON 07/26/2022] oxycodone-acetaminophen, and [START ON 08/25/2022] oxycodone-acetaminophen.  1. Trochanteric bursitis of left hip - HIP INJECTION; Future  2. Chronic hip pain, left - HIP INJECTION; Future  3. History of CVA (cerebrovascular accident)  4. Chronic pain syndrome - oxyCODONE-acetaminophen (PERCOCET) 10-325 MG tablet; Take 1 tablet by mouth every 8 (eight) hours as needed for pain. For chronic pain syndrome  Dispense: 90 tablet; Refill: 0 - oxyCODONE-acetaminophen (PERCOCET) 10-325 MG tablet; Take 1 tablet by mouth every 8 (eight) hours as needed for pain. For chronic pain syndrome  Dispense: 90 tablet; Refill: 0 - oxyCODONE-acetaminophen (PERCOCET) 10-325 MG tablet; Take 1 tablet by mouth every 8 (eight) hours as needed for pain. For chronic pain syndrome  Dispense: 90 tablet; Refill: 0  5. Pain medication agreement  6. Controlled substance agreement signed  7. Spinal stenosis of lumbar region, unspecified whether neurogenic claudication present - oxyCODONE-acetaminophen (PERCOCET) 10-325 MG tablet; Take 1 tablet by mouth every 8 (eight) hours as needed for pain. For chronic pain syndrome  Dispense: 90 tablet; Refill: 0 - oxyCODONE-acetaminophen (PERCOCET) 10-325 MG tablet; Take 1 tablet by mouth every 8 (eight) hours as needed for pain. For chronic pain syndrome  Dispense: 90 tablet; Refill: 0 - oxyCODONE-acetaminophen (PERCOCET) 10-325 MG tablet; Take 1 tablet by mouth every 8 (eight) hours as needed for pain. For chronic pain syndrome  Dispense: 90 tablet; Refill: 0  8. Cervical facet joint syndrome - oxyCODONE-acetaminophen (PERCOCET) 10-325 MG tablet; Take 1 tablet by mouth every 8  (eight) hours as needed for pain. For chronic pain syndrome  Dispense: 90 tablet; Refill: 0 - oxyCODONE-acetaminophen (PERCOCET) 10-325 MG tablet; Take 1 tablet by mouth every 8 (eight) hours as needed for  pain. For chronic pain syndrome  Dispense: 90 tablet; Refill: 0 - oxyCODONE-acetaminophen (PERCOCET) 10-325 MG tablet; Take 1 tablet by mouth every 8 (eight) hours as needed for pain. For chronic pain syndrome  Dispense: 90 tablet; Refill: 0  9. Thoracic facet joint syndrome - oxyCODONE-acetaminophen (PERCOCET) 10-325 MG tablet; Take 1 tablet by mouth every 8 (eight) hours as needed for pain. For chronic pain syndrome  Dispense: 90 tablet; Refill: 0 - oxyCODONE-acetaminophen (PERCOCET) 10-325 MG tablet; Take 1 tablet by mouth every 8 (eight) hours as needed for pain. For chronic pain syndrome  Dispense: 90 tablet; Refill: 0 - oxyCODONE-acetaminophen (PERCOCET) 10-325 MG tablet; Take 1 tablet by mouth every 8 (eight) hours as needed for pain. For chronic pain syndrome  Dispense: 90 tablet; Refill: 0   Pharmacotherapy (Medications Ordered): Meds ordered this encounter  Medications   oxyCODONE-acetaminophen (PERCOCET) 10-325 MG tablet    Sig: Take 1 tablet by mouth every 8 (eight) hours as needed for pain. For chronic pain syndrome    Dispense:  90 tablet    Refill:  0   oxyCODONE-acetaminophen (PERCOCET) 10-325 MG tablet    Sig: Take 1 tablet by mouth every 8 (eight) hours as needed for pain. For chronic pain syndrome    Dispense:  90 tablet    Refill:  0   oxyCODONE-acetaminophen (PERCOCET) 10-325 MG tablet    Sig: Take 1 tablet by mouth every 8 (eight) hours as needed for pain. For chronic pain syndrome    Dispense:  90 tablet    Refill:  0   Continue Lyrica as prescribed, no refills needed. Continue tizanidine as needed   Orders Placed This Encounter  Procedures   HIP INJECTION    Standing Status:   Future    Standing Expiration Date:   09/26/2022    Scheduling Instructions:      Left hip bursa injection    Follow-up plan:   Return in about 2 weeks (around 07/10/2022) for left hip bursa , in clinic [facility].   Recent Visits No visits were found meeting these conditions. Showing recent visits within past 90 days and meeting all other requirements Today's Visits Date Type Provider Dept  06/26/22 Office Visit Gillis Santa, MD [facility]  Showing today's visits and meeting all other requirements Future Appointments No visits were found meeting these conditions. Showing future appointments within next 90 days and meeting all other requirements  I discussed the assessment and treatment plan with the patient. The patient was provided an opportunity to ask questions and all were answered. The patient agreed with the plan and demonstrated an understanding of the instructions.  Patient advised to call back or seek an in-person evaluation if the symptoms or condition worsens.  Duration of encounter: 63mnutes.  Note by: BGillis Santa MD Date: 06/26/2022; Time: 1:52 PM

## 2022-06-26 NOTE — Patient Instructions (Signed)
GENERAL RISKS AND COMPLICATIONS  What are the risk, side effects and possible complications? Generally speaking, most procedures are safe.  However, with any procedure there are risks, side effects, and the possibility of complications.  The risks and complications are dependent upon the sites that are lesioned, or the type of nerve block to be performed.  The closer the procedure is to the spine, the more serious the risks are.  Great care is taken when placing the radio frequency needles, block needles or lesioning probes, but sometimes complications can occur. Infection: Any time there is an injection through the skin, there is a risk of infection.  This is why sterile conditions are used for these blocks.  There are four possible types of infection. Localized skin infection. Central Nervous System Infection-This can be in the form of Meningitis, which can be deadly. Epidural Infections-This can be in the form of an epidural abscess, which can cause pressure inside of the spine, causing compression of the spinal cord with subsequent paralysis. This would require an emergency surgery to decompress, and there are no guarantees that the patient would recover from the paralysis. Discitis-This is an infection of the intervertebral discs.  It occurs in about 1% of discography procedures.  It is difficult to treat and it may lead to surgery.        2. Pain: the needles have to go through skin and soft tissues, will cause soreness.       3. Damage to internal structures:  The nerves to be lesioned may be near blood vessels or    other nerves which can be potentially damaged.       4. Bleeding: Bleeding is more common if the patient is taking blood thinners such as  aspirin, Coumadin, Ticiid, Plavix, etc., or if he/she have some genetic predisposition  such as hemophilia. Bleeding into the spinal canal can cause compression of the spinal  cord with subsequent paralysis.  This would require an emergency  surgery to  decompress and there are no guarantees that the patient would recover from the  paralysis.       5. Pneumothorax:  Puncturing of a lung is a possibility, every time a needle is introduced in  the area of the chest or upper back.  Pneumothorax refers to free air around the  collapsed lung(s), inside of the thoracic cavity (chest cavity).  Another two possible  complications related to a similar event would include: Hemothorax and Chylothorax.   These are variations of the Pneumothorax, where instead of air around the collapsed  lung(s), you may have blood or chyle, respectively.       6. Spinal headaches: They may occur with any procedures in the area of the spine.       7. Persistent CSF (Cerebro-Spinal Fluid) leakage: This is a rare problem, but may occur  with prolonged intrathecal or epidural catheters either due to the formation of a fistulous  track or a dural tear.       8. Nerve damage: By working so close to the spinal cord, there is always a possibility of  nerve damage, which could be as serious as a permanent spinal cord injury with  paralysis.       9. Death:  Although rare, severe deadly allergic reactions known as "Anaphylactic  reaction" can occur to any of the medications used.      10. Worsening of the symptoms:  We can always make thing worse.  What are the chances   of something like this happening? Chances of any of this occuring are extremely low.  By statistics, you have more of a chance of getting killed in a motor vehicle accident: while driving to the hospital than any of the above occurring .  Nevertheless, you should be aware that they are possibilities.  In general, it is similar to taking a shower.  Everybody knows that you can slip, hit your head and get killed.  Does that mean that you should not shower again?  Nevertheless always keep in mind that statistics do not mean anything if you happen to be on the wrong side of them.  Even if a procedure has a 1 (one) in a  1,000,000 (million) chance of going wrong, it you happen to be that one..Also, keep in mind that by statistics, you have more of a chance of having something go wrong when taking medications.  Who should not have this procedure? If you are on a blood thinning medication (e.g. Coumadin, Plavix, see list of "Blood Thinners"), or if you have an active infection going on, you should not have the procedure.  If you are taking any blood thinners, please inform your physician.  How should I prepare for this procedure? Do not eat or drink anything at least six hours prior to the procedure. Bring a driver with you .  It cannot be a taxi. Come accompanied by an adult that can drive you back, and that is strong enough to help you if your legs get weak or numb from the local anesthetic. Take all of your medicines the morning of the procedure with just enough water to swallow them. If you have diabetes, make sure that you are scheduled to have your procedure done first thing in the morning, whenever possible. If you have diabetes, take only half of your insulin dose and notify our nurse that you have done so as soon as you arrive at the clinic. If you are diabetic, but only take blood sugar pills (oral hypoglycemic), then do not take them on the morning of your procedure.  You may take them after you have had the procedure. Do not take aspirin or any aspirin-containing medications, at least eleven (11) days prior to the procedure.  They may prolong bleeding. Wear loose fitting clothing that may be easy to take off and that you would not mind if it got stained with Betadine or blood. Do not wear any jewelry or perfume Remove any nail coloring.  It will interfere with some of our monitoring equipment.  NOTE: Remember that this is not meant to be interpreted as a complete list of all possible complications.  Unforeseen problems may occur.  BLOOD THINNERS The following drugs contain aspirin or other products,  which can cause increased bleeding during surgery and should not be taken for 2 weeks prior to and 1 week after surgery.  If you should need take something for relief of minor pain, you may take acetaminophen which is found in Tylenol,m Datril, Anacin-3 and Panadol. It is not blood thinner. The products listed below are.  Do not take any of the products listed below in addition to any listed on your instruction sheet.  A.P.C or A.P.C with Codeine Codeine Phosphate Capsules #3 Ibuprofen Ridaura  ABC compound Congesprin Imuran rimadil  Advil Cope Indocin Robaxisal  Alka-Seltzer Effervescent Pain Reliever and Antacid Coricidin or Coricidin-D  Indomethacin Rufen  Alka-Seltzer plus Cold Medicine Cosprin Ketoprofen S-A-C Tablets  Anacin Analgesic Tablets or Capsules Coumadin   Korlgesic Salflex  Anacin Extra Strength Analgesic tablets or capsules CP-2 Tablets Lanoril Salicylate  Anaprox Cuprimine Capsules Levenox Salocol  Anexsia-D Dalteparin Magan Salsalate  Anodynos Darvon compound Magnesium Salicylate Sine-off  Ansaid Dasin Capsules Magsal Sodium Salicylate  Anturane Depen Capsules Marnal Soma  APF Arthritis pain formula Dewitt's Pills Measurin Stanback  Argesic Dia-Gesic Meclofenamic Sulfinpyrazone  Arthritis Bayer Timed Release Aspirin Diclofenac Meclomen Sulindac  Arthritis pain formula Anacin Dicumarol Medipren Supac  Analgesic (Safety coated) Arthralgen Diffunasal Mefanamic Suprofen  Arthritis Strength Bufferin Dihydrocodeine Mepro Compound Suprol  Arthropan liquid Dopirydamole Methcarbomol with Aspirin Synalgos  ASA tablets/Enseals Disalcid Micrainin Tagament  Ascriptin Doan's Midol Talwin  Ascriptin A/D Dolene Mobidin Tanderil  Ascriptin Extra Strength Dolobid Moblgesic Ticlid  Ascriptin with Codeine Doloprin or Doloprin with Codeine Momentum Tolectin  Asperbuf Duoprin Mono-gesic Trendar  Aspergum Duradyne Motrin or Motrin IB Triminicin  Aspirin plain, buffered or enteric coated  Durasal Myochrisine Trigesic  Aspirin Suppositories Easprin Nalfon Trillsate  Aspirin with Codeine Ecotrin Regular or Extra Strength Naprosyn Uracel  Atromid-S Efficin Naproxen Ursinus  Auranofin Capsules Elmiron Neocylate Vanquish  Axotal Emagrin Norgesic Verin  Azathioprine Empirin or Empirin with Codeine Normiflo Vitamin E  Azolid Emprazil Nuprin Voltaren  Bayer Aspirin plain, buffered or children's or timed BC Tablets or powders Encaprin Orgaran Warfarin Sodium  Buff-a-Comp Enoxaparin Orudis Zorpin  Buff-a-Comp with Codeine Equegesic Os-Cal-Gesic   Buffaprin Excedrin plain, buffered or Extra Strength Oxalid   Bufferin Arthritis Strength Feldene Oxphenbutazone   Bufferin plain or Extra Strength Feldene Capsules Oxycodone with Aspirin   Bufferin with Codeine Fenoprofen Fenoprofen Pabalate or Pabalate-SF   Buffets II Flogesic Panagesic   Buffinol plain or Extra Strength Florinal or Florinal with Codeine Panwarfarin   Buf-Tabs Flurbiprofen Penicillamine   Butalbital Compound Four-way cold tablets Penicillin   Butazolidin Fragmin Pepto-Bismol   Carbenicillin Geminisyn Percodan   Carna Arthritis Reliever Geopen Persantine   Carprofen Gold's salt Persistin   Chloramphenicol Goody's Phenylbutazone   Chloromycetin Haltrain Piroxlcam   Clmetidine heparin Plaquenil   Cllnoril Hyco-pap Ponstel   Clofibrate Hydroxy chloroquine Propoxyphen         Before stopping any of these medications, be sure to consult the physician who ordered them.  Some, such as Coumadin (Warfarin) are ordered to prevent or treat serious conditions such as "deep thrombosis", "pumonary embolisms", and other heart problems.  The amount of time that you may need off of the medication may also vary with the medication and the reason for which you were taking it.  If you are taking any of these medications, please make sure you notify your pain physician before you undergo any procedures.          

## 2022-07-10 ENCOUNTER — Ambulatory Visit
Payer: Medicare HMO | Attending: Student in an Organized Health Care Education/Training Program | Admitting: Student in an Organized Health Care Education/Training Program

## 2022-07-10 ENCOUNTER — Ambulatory Visit
Admission: RE | Admit: 2022-07-10 | Discharge: 2022-07-10 | Disposition: A | Discharge status: Home / Self Care | Payer: Medicare HMO | Source: Ambulatory Visit | Attending: Student in an Organized Health Care Education/Training Program | Admitting: Student in an Organized Health Care Education/Training Program

## 2022-07-10 ENCOUNTER — Encounter: Payer: Self-pay | Admitting: Student in an Organized Health Care Education/Training Program

## 2022-07-10 VITALS — BP 125/66 | HR 50 | Temp 97.4°F | Resp 18 | Ht 69.0 in | Wt 107.0 lb

## 2022-07-10 DIAGNOSIS — M7062 Trochanteric bursitis, left hip: Secondary | ICD-10-CM

## 2022-07-10 DIAGNOSIS — G8929 Other chronic pain: Secondary | ICD-10-CM

## 2022-07-10 DIAGNOSIS — M25552 Pain in left hip: Secondary | ICD-10-CM

## 2022-07-10 MED ORDER — ROPIVACAINE HCL 2 MG/ML IJ SOLN
INTRAMUSCULAR | Status: AC
  Filled 2022-07-10: qty 20

## 2022-07-10 MED ORDER — ROPIVACAINE HCL 2 MG/ML IJ SOLN
4.0000 mL | Freq: Once | INTRAMUSCULAR | Status: AC
  Administered 2022-07-10: 4 mL via INTRA_ARTICULAR

## 2022-07-10 MED ORDER — DEXAMETHASONE SODIUM PHOSPHATE 10 MG/ML IJ SOLN
INTRAMUSCULAR | Status: AC
  Filled 2022-07-10: qty 1

## 2022-07-10 MED ORDER — DEXAMETHASONE SODIUM PHOSPHATE 10 MG/ML IJ SOLN
10.0000 mg | Freq: Once | INTRAMUSCULAR | Status: AC
Start: 2022-07-10 — End: 2022-07-10
  Administered 2022-07-10: 10 mg

## 2022-07-10 MED ORDER — IOHEXOL 180 MG/ML  SOLN
10.0000 mL | Freq: Once | INTRAMUSCULAR | Status: AC
Start: 2022-07-10 — End: 2022-07-10
  Administered 2022-07-10: 10 mL via INTRA_ARTICULAR
  Filled 2022-07-10: qty 20

## 2022-07-10 MED ORDER — LIDOCAINE HCL (PF) 2 % IJ SOLN
INTRAMUSCULAR | Status: AC
  Filled 2022-07-10: qty 10

## 2022-07-10 MED ORDER — LIDOCAINE HCL 2 % IJ SOLN
20.0000 mL | Freq: Once | INTRAMUSCULAR | Status: AC
Start: 2022-07-10 — End: 2022-07-10
  Administered 2022-07-10: 100 mg

## 2022-07-10 NOTE — Progress Notes (Signed)
PROVIDER NOTE: Interpretation of information contained herein should be left to medically-trained personnel. Specific patient instructions are provided elsewhere under "Patient Instructions" section of medical record. This document was created in part using STT-dictation technology, any transcriptional errors that may result from this process are unintentional.  Patient: Logan Vazquez Type: Established DOB: 03-Oct-1946 MRN: [id number] PCP: Logan Median, MD  Service: Procedure DOS: 07/10/2022 Setting: Ambulatory Location: Ambulatory outpatient facility Delivery: Face-to-face Provider: Gillis Santa, MD Specialty: Interventional Pain Management Specialty designation: 09 Location: Outpatient facility Ref. Prov.: Bender, Durene Cal, MD    Primary Reason for Visit: Interventional Pain Management Treatment. CC: Hip Pain (Bilat, left is worse)    Procedure:          Anesthesia, Analgesia, Anxiolysis:  Type: Hip bursa injection #1  Primary Purpose: Diagnostic Region: Upper (proximal) Femoral Region Level: Hip Joint Target Area: Trochanteric Bursa Approach: Posterolateral approach Laterality: Left  Type: Local Anesthesia Local Anesthetic: Lidocaine 1-2% Sedation: None  Indication(s):  Analgesia Route: Infiltration ([place]/IM) IV Access: N/A   Position: Lateral Decubitus with bad side up   1. Trochanteric bursitis of left hip   2. Chronic hip pain, left    NAS-11 Pain score:   Pre-procedure: 9 /10   Post-procedure: 5 /10     Pre-op H&P Assessment:  Logan Vazquez is a 76 y.o. (year old), male patient, seen today for interventional treatment. He  has a past surgical history that includes heart surgery (07/1999); Hip surgery (Left, 1968); Back surgery (1985); Skin graft (1968); Humerus fracture surgery (1968); ENDOVASCULAR REPAIR/STENT GRAFT (N/A, 03/22/2022); Coronary/Graft Acute MI Revascularization (N/A, 06/08/2022); and LEFT HEART CATH AND CORONARY ANGIOGRAPHY (N/A, 06/08/2022). Mr.  Vazquez has a current medication list which includes the following prescription(s): albuterol, amlodipine, apixaban, atorvastatin, buspirone, b-12, docusate sodium, fluoxetine, fluticasone, hydrocortisone, loperamide, metoprolol tartrate, nitroglycerin, omeprazole, oxycodone-acetaminophen, [START ON 07/26/2022] oxycodone-acetaminophen, [START ON 08/25/2022] oxycodone-acetaminophen, pregabalin, spiriva handihaler, tamsulosin, and tizanidine. His primarily concern today is the Hip Pain (Bilat, left is worse)  Initial Vital Signs:  Pulse/HCG Rate:  (!) 50   Temp: (!) 97.4 F (36.3 C) Resp: 16 BP: 111/73 SpO2: 97 %  BMI: Estimated body mass index is 15.8 kg/m as calculated from the following:   Height as of this encounter: 5\' 9"  (1.753 m).   Weight as of this encounter: 107 lb (48.5 kg).  Risk Assessment: Allergies: Reviewed. He is allergic to ibuprofen.  Allergy Precautions: None required Coagulopathies: Reviewed. None identified.  Blood-thinner therapy: None at this time Active Infection(s): Reviewed. None identified. Logan Vazquez is afebrile  Site Confirmation: Logan Vazquez was asked to confirm the procedure and laterality before marking the site Procedure checklist: Completed Consent: Before the procedure and under the influence of no sedative(s), amnesic(s), or anxiolytics, the patient was informed of the treatment options, risks and possible complications. To fulfill our ethical and legal obligations, as recommended by the American Medical Association's Code of Ethics, I have informed the patient of my clinical impression; the nature and purpose of the treatment or procedure; the risks, benefits, and possible complications of the intervention; the alternatives, including doing nothing; the risk(s) and benefit(s) of the alternative treatment(s) or procedure(s); and the risk(s) and benefit(s) of doing nothing. The patient was provided information about the general risks and possible complications  associated with the procedure. These may include, but are not limited to: failure to achieve desired goals, infection, bleeding, organ or nerve damage, allergic reactions, paralysis, and death. In addition, the patient was informed of those risks  and complications associated to the procedure, such as failure to decrease pain; infection; bleeding; organ or nerve damage with subsequent damage to sensory, motor, and/or autonomic systems, resulting in permanent pain, numbness, and/or weakness of one or several areas of the body; allergic reactions; (i.e.: anaphylactic reaction); and/or death. Furthermore, the patient was informed of those risks and complications associated with the medications. These include, but are not limited to: allergic reactions (i.e.: anaphylactic or anaphylactoid reaction(s)); adrenal axis suppression; blood sugar elevation that in diabetics may result in ketoacidosis or comma; water retention that in patients with history of congestive heart failure may result in shortness of breath, pulmonary edema, and decompensation with resultant heart failure; weight gain; swelling or edema; medication-induced neural toxicity; particulate matter embolism and blood vessel occlusion with resultant organ, and/or nervous system infarction; and/or aseptic necrosis of one or more joints. Finally, the patient was informed that Medicine is not an exact science; therefore, there is also the possibility of unforeseen or unpredictable risks and/or possible complications that may result in a catastrophic outcome. The patient indicated having understood very clearly. We have given the patient no guarantees and we have made no promises. Enough time was given to the patient to ask questions, all of which were answered to the patient's satisfaction. Logan Vazquez has indicated that he wanted to continue with the procedure. Logan Vazquez: I, the ordering provider, attest that I have discussed with the patient the benefits,  risks, side-effects, alternatives, likelihood of achieving goals, and potential problems during recovery for the procedure that I have provided informed consent. Date  Time: 07/10/2022  2:26 PM  Pre-Procedure Preparation:  Monitoring: As per clinic protocol. Respiration, ETCO2, SpO2, BP, heart rate and rhythm monitor placed and checked for adequate function Safety Precautions: Patient was assessed for positional comfort and pressure points before starting the procedure. Time-out: I initiated and conducted the "Time-out" before starting the procedure, as per protocol. The patient was asked to participate by confirming the accuracy of the "Time Out" information. Verification of the correct person, site, and procedure were performed and confirmed by me, the nursing staff, and the patient. "Time-out" conducted as per Joint Commission's Universal Protocol (UP.01.01.01). Time: 1454  Description of Procedure:          Area Prepped: Entire Posterolateral hip area. DuraPrep (Iodine Povacrylex [0.7% available iodine] and Isopropyl Alcohol, 74% w/w) Safety Precautions: Aspiration looking for blood return was conducted prior to all injections. At no point did we inject any substances, as a needle was being advanced. No attempts were made at seeking any paresthesias. Safe injection practices and needle disposal techniques used. Medications properly checked for expiration dates. SDV (single dose vial) medications used. Description of the Procedure: Protocol guidelines were followed. The patient was placed in position over the procedure table. The target area was identified and the area prepped in the usual manner. Skin & deeper tissues infiltrated with local anesthetic. Appropriate amount of time allowed to pass for local anesthetics to take effect. The procedure needles were then advanced to the target area. Proper needle placement secured. Negative aspiration confirmed. Solution injected in intermittent fashion,  asking for systemic symptoms every 0.5cc of injectate. The needles were then removed and the area cleansed, making sure to leave some of the prepping solution back to take advantage of its long term bactericidal properties. Vitals:   07/10/22 1438 07/10/22 1453 07/10/22 1457  BP: 111/73 132/66 125/66  Pulse: (!) 50 (!) 49 (!) 50  Resp: 16 16 18   Temp: [name])  97.4 F (36.3 C)    TempSrc: Temporal    SpO2: 97% 99% 98%  Weight: 107 lb (48.5 kg)    Height: 5\' 9"  (1.753 m)      Start Time: 1454 hrs. End Time: 1457 hrs.           Materials:  Needle(s) Type: Spinal Needle Gauge: 25G Length: 3.5-in Medication(s): Please see orders for medications and dosing details.  5cc solution made of 4cc of 0.2% ropivacaine, 1 cc of Decadron 10 mg/cc. Injected for the left GTB after contrast confirmation   Imaging Guidance (Non-Spinal):          Type of Imaging Technique: Fluoroscopy Guidance (Non-Spinal) Indication(s): Assistance in needle guidance and placement for procedures requiring needle placement in or near specific anatomical locations not easily accessible without such assistance. Exposure Time: Please see nurses notes. Contrast: Before injecting any contrast, we confirmed that the patient did not have an allergy to iodine, shellfish, or radiological contrast. Once satisfactory needle placement was completed at the desired level, radiological contrast was injected. Contrast injected under live fluoroscopy. No contrast complications. See chart for type and volume of contrast used. Fluoroscopic Guidance: I was personally present during the use of fluoroscopy. "Tunnel Vision Technique" used to obtain the best possible view of the target area. Parallax error corrected before commencing the procedure. "Direction-depth-direction" technique used to introduce the needle under continuous pulsed fluoroscopy. Once target was reached, antero-posterior, oblique, and lateral fluoroscopic projection used  confirm needle placement in all planes. Images permanently stored in EMR. Interpretation: I personally interpreted the imaging intraoperatively. Adequate needle placement confirmed in multiple planes. Appropriate spread of contrast into desired area was observed. No evidence of afferent or efferent intravascular uptake. Permanent images saved into the patient's record.  Antibiotic Prophylaxis:   Anti-infectives (From admission, onward)    None      Indication(s): None identified  Post-operative Assessment:  Post-procedure Vital Signs:  Pulse/HCG Rate:  (!) 50  Temp:  (!) 97.4 F (36.3 C) Resp: 18 BP: 125/66 SpO2: 98 %  EBL: None  Complications: No immediate post-treatment complications observed by team, or reported by patient.  Note: The patient tolerated the entire procedure well. A repeat set of vitals were taken after the procedure and the patient was kept under observation following institutional policy, for this type of procedure. Post-procedural neurological assessment was performed, showing return to baseline, prior to discharge. The patient was provided with post-procedure discharge instructions, including a section on how to identify potential problems. Should any problems arise concerning this procedure, the patient was given instructions to immediately contact , at any time, without hesitation. In any case, we plan to contact the patient by telephone for a follow-up status report regarding this interventional procedure.  Comments:  No additional relevant information.  Plan of Care  Orders:  Orders Placed This Encounter  Procedures   DG PAIN CLINIC C-ARM 1-60 MIN NO REPORT    Intraoperative interpretation by procedural physician at [facility].    Standing Status:   Standing    Number of Occurrences:   1    Order Specific Question:   Reason for exam:    Answer:   Assistance in needle guidance and placement for procedures requiring needle placement in or  near specific anatomical locations not easily accessible without such assistance.   Chronic Opioid Analgesic:  Percocet 10 mg 3 times daily as needed, quantity 90/month; MME equals 45    Medications ordered for procedure: Meds ordered this  encounter  Medications   iohexol (OMNIPAQUE) 180 MG/ML injection 10 mL    Must be Myelogram-compatible. If not available, you may substitute with a water-soluble, non-ionic, hypoallergenic, myelogram-compatible radiological contrast medium.   lidocaine (XYLOCAINE) 2 % (with pres) injection 400 mg   dexamethasone (DECADRON) injection 10 mg   ropivacaine (PF) 2 mg/mL (0.2%) (NAROPIN) injection 4 mL   Medications administered: We administered iohexol, lidocaine, dexamethasone, and ropivacaine (PF) 2 mg/mL (0.2%).  See the medical record for exact dosing, route, and time of administration.  Follow-up plan:   Return in about 4 weeks (around 08/07/2022) for Post Procedure Evaluation, virtual.     Recent Visits Date Type Provider Dept  06/26/22 Office Visit Edward Jolly, MD [facility]  Showing recent visits within past 90 days and meeting all other requirements Today's Visits Date Type Provider Dept  07/10/22 Procedure visit Edward Jolly, MD [facility]  Showing today's visits and meeting all other requirements Future Appointments Date Type Provider Dept  08/07/22 Appointment Edward Jolly, MD [facility]  09/24/22 Appointment Edward Jolly, MD [facility]  Showing future appointments within next 90 days and meeting all other requirements  Disposition: Discharge home  Discharge (Date  Time): 07/10/2022; 1510 hrs.   Primary Care Physician: Oswaldo Conroy, MD Location: [facility] Note by: Edward Jolly, MD Date: 07/10/2022; Time: 3:02 PM  Disclaimer:  Medicine is not an exact science. The only guarantee in medicine is that nothing is guaranteed. It is important to note  that the decision to proceed with this intervention was based on the information collected from the patient. The Data and conclusions were drawn from the patient's questionnaire, the interview, and the physical examination. Because the information was provided in large part by the patient, it cannot be guaranteed that it has not been purposely or unconsciously manipulated. Every effort has been made to obtain as much relevant data as possible for this evaluation. It is important to note that the conclusions that lead to this procedure are derived in large part from the available data. Always take into account that the treatment will also be dependent on availability of resources and existing treatment guidelines, considered by other Pain Management Practitioners as being common knowledge and practice, at the time of the intervention. For Medico-Legal purposes, it is also important to point out that variation in procedural techniques and pharmacological choices are the acceptable norm. The indications, contraindications, technique, and results of the above procedure should only be interpreted and judged by a Board-Certified Interventional Pain Specialist with extensive familiarity and expertise in the same exact procedure and technique.

## 2022-07-10 NOTE — Progress Notes (Signed)
Safety precautions to be maintained throughout the outpatient stay will include: orient to surroundings, keep bed in low position, maintain call bell within reach at all times, provide assistance with transfer out of bed and ambulation.  

## 2022-07-10 NOTE — Patient Instructions (Signed)

## 2022-07-11 ENCOUNTER — Telehealth: Payer: Self-pay

## 2022-07-11 NOTE — Telephone Encounter (Signed)
Post procedure phone call.  Patient states he is doing good.  

## 2022-07-29 ENCOUNTER — Telehealth: Payer: Self-pay

## 2022-07-29 ENCOUNTER — Other Ambulatory Visit: Payer: Self-pay

## 2022-07-29 DIAGNOSIS — M47812 Spondylosis without myelopathy or radiculopathy, cervical region: Secondary | ICD-10-CM

## 2022-07-29 DIAGNOSIS — M48061 Spinal stenosis, lumbar region without neurogenic claudication: Secondary | ICD-10-CM

## 2022-07-29 DIAGNOSIS — G894 Chronic pain syndrome: Secondary | ICD-10-CM

## 2022-07-29 DIAGNOSIS — M47894 Other spondylosis, thoracic region: Secondary | ICD-10-CM

## 2022-07-29 MED ORDER — OXYCODONE-ACETAMINOPHEN 10-325 MG PO TABS: 1.0000 | ORAL_TABLET | Freq: Three times a day (TID) | ORAL | 0 refills | Status: DC | PRN

## 2022-07-29 NOTE — Telephone Encounter (Signed)
Pharmacy called. Script ready for pick up. Patient called.

## 2022-07-29 NOTE — Telephone Encounter (Signed)
Pharmacy is out of his meds. He found some at [facility] on [address] [place]. Please call out to there. Please call patient when done

## 2022-07-29 NOTE — Telephone Encounter (Signed)
Patient called again to see if his meds have been called in

## 2022-07-29 NOTE — Telephone Encounter (Signed)
Refill request sent to Dr Lateef.  

## 2022-08-06 ENCOUNTER — Ambulatory Visit (INDEPENDENT_AMBULATORY_CARE_PROVIDER_SITE_OTHER): Payer: Medicare HMO | Admitting: Vascular Surgery

## 2022-08-06 ENCOUNTER — Other Ambulatory Visit (INDEPENDENT_AMBULATORY_CARE_PROVIDER_SITE_OTHER): Payer: Medicare HMO

## 2022-08-07 ENCOUNTER — Ambulatory Visit
Payer: Medicare HMO | Attending: Student in an Organized Health Care Education/Training Program | Admitting: Student in an Organized Health Care Education/Training Program

## 2022-08-07 DIAGNOSIS — G8929 Other chronic pain: Secondary | ICD-10-CM

## 2022-08-07 DIAGNOSIS — M7062 Trochanteric bursitis, left hip: Secondary | ICD-10-CM

## 2022-08-07 DIAGNOSIS — G894 Chronic pain syndrome: Secondary | ICD-10-CM

## 2022-08-07 DIAGNOSIS — M25552 Pain in left hip: Secondary | ICD-10-CM | POA: Diagnosis not present

## 2022-08-07 NOTE — Progress Notes (Signed)
Patient: Logan Vazquez  Service Category: E/M  Provider: Gillis Santa, MD  DOB: 03-14-1946  DOS: 08/07/2022  Location: Office  MRN: [id number]  Setting: Ambulatory outpatient  Referring Provider: Letta Median, MD  Type: Established Patient  Specialty: Interventional Pain Management  PCP: Logan Median, MD  Location: Remote location  Delivery: TeleHealth     Virtual Encounter - Pain Management PROVIDER NOTE: Information contained herein reflects review and annotations entered in association with encounter. Interpretation of such information and data should be left to medically-trained personnel. Information provided to patient can be located elsewhere in the medical record under "Patient Instructions". Document created using STT-dictation technology, any transcriptional errors that may result from process are unintentional.    Contact & Pharmacy Preferred: [phone] Home: [phone] (home) Mobile: [phone] (mobile) E-mail: No e-mail address on record  [place], [place] [address] [place] [address] Phone: [phone] Fax: [phone]  [facility] - [place], [place] [address] Phone: [phone] Fax: [phone]  [id number] [place], [place] [address] [place]: [phone]: [phone]  Pre-screening  Mr. GHickleoffered "in-person" vs "virtual" encounter. He indicated preferring virtual for this encounter.   Reason COVID-19*  Social distancing based on CDC and AMA recommendations.   I contacted Logan Pavlovon 08/07/2022 via telephone.      I clearly identified myself as BGillis Santa MD. I verified that I was speaking with the correct person using two identifiers (Name: [name] and date of birth: 718-Aug-1947.  Consent I sought verbal advanced consent from Logan Pavlovfor virtual  visit interactions. I informed Mr. GToneyof possible security and privacy concerns, risks, and limitations associated with providing "not-in-person" medical evaluation and management services. I also informed [name] the availability of "in-person" appointments. Finally, I informed him that there would be a charge for the virtual visit and that he could be  personally, fully or partially, financially responsible for it. [name] understanding and agreed to proceed.   Historic Elements   Mr. JSHIHAB STATESis a 76y.o. year old, male patient evaluated today after our last contact on 07/10/2022. Logan Vazquez has a past medical history of Arthritis, Asthma, CAD (coronary artery disease), Depression, Emphysema lung [place], Glaucoma, and Hypertension. He also  has a past surgical history that includes heart surgery (07/1999); Hip surgery (Left, 1968); Back surgery (1985); Skin graft (1968); Humerus fracture surgery (1968); ENDOVASCULAR REPAIR/STENT GRAFT (N/A, 03/22/2022); Coronary/Graft Acute MI Revascularization (N/A, 06/08/2022); and LEFT HEART CATH AND CORONARY ANGIOGRAPHY (N/A, 06/08/2022). Mr. GWillettehas a current medication list which includes the following prescription(s): albuterol, amlodipine, apixaban, atorvastatin, buspirone, b-12, docusate sodium, fluoxetine, fluticasone, hydrocortisone, loperamide, metoprolol tartrate, nitroglycerin, omeprazole, [START ON 08/25/2022] oxycodone-acetaminophen, oxycodone-acetaminophen, pregabalin, spiriva handihaler, tamsulosin, tizanidine, and oxycodone-acetaminophen. He  reports that he has been smoking cigarettes. He has a 29.00 pack-year smoking history. He has never used smokeless tobacco. He reports that he does not drink alcohol and does not use drugs. [name] allergic to ibuprofen.   HPI  Today, he is being contacted for a post-procedure assessment.   Post-procedure evaluation    Procedure:          Anesthesia, Analgesia, Anxiolysis:  Type:  Hip bursa injection #1  Primary Purpose: Diagnostic Region: Upper (proximal) Femoral  Region Level: Hip Joint Target Area: Trochanteric Bursa Approach: Posterolateral approach Laterality: Left  Type: Local Anesthesia Local Anesthetic: Lidocaine 1-2% Sedation: None  Indication(s):  Analgesia Route: Infiltration (Williams/IM) IV Access: N/A   Position: Lateral Decubitus with bad side up   1. Trochanteric bursitis of left hip   2. Chronic hip pain, left    NAS-11 Pain score:   Pre-procedure: 9 /10   Post-procedure: 5 /10      Effectiveness:  Initial hour after procedure: 100 %  Subsequent 4-6 hours post-procedure: 0 %  Analgesia past initial 6 hours: 0 %  Ongoing improvement:  Analgesic:  0%  Pharmacotherapy Assessment   Opioid Analgesic: Percocet 10 mg 3 times daily as needed, quantity 90/month; MME equals 45    Monitoring: [place] PMP: PDMP not reviewed this encounter.       Pharmacotherapy: No side-effects or adverse reactions reported. Compliance: No problems identified. Effectiveness: Clinically acceptable. Plan: Refer to "POC". UDS:  Summary  Date Value Ref Range Status  01/05/2019 FINAL  Final    Comment:    ==================================================================== TOXASSURE SELECT 13 (MW) ==================================================================== Test                             Result       Flag       Units Drug Present and Declared for Prescription Verification   Oxycodone                      1254         EXPECTED   ng/mg creat   Oxymorphone                    310          EXPECTED   ng/mg creat   Noroxycodone                   4592         EXPECTED   ng/mg creat   Noroxymorphone                 247          EXPECTED   ng/mg creat    Sources of oxycodone are scheduled prescription medications.    Oxymorphone, noroxycodone, and noroxymorphone are expected    metabolites of oxycodone. Oxymorphone is also available as a    scheduled prescription  medication. Drug Present not Declared for Prescription Verification   Alprazolam                     15           UNEXPECTED ng/mg creat   Alpha-hydroxyalprazolam        19           UNEXPECTED ng/mg creat    Source of alprazolam is a scheduled prescription medication.    Alpha-hydroxyalprazolam is an expected metabolite of alprazolam. ==================================================================== Test                      Result    Flag   Units      Ref Range   Creatinine              150              mg/dL      >=20 ==================================================================== Declared Medications:  The flagging and interpretation on this report are based  on the  following declared medications.  Unexpected results may arise from  inaccuracies in the declared medications.  **Note: The testing scope of this panel includes these medications:  Oxycodone (Oxycodone Acetaminophen)  **Note: The testing scope of this panel does not include following  reported medications:  Acetaminophen (Oxycodone Acetaminophen)  Albuterol  Aspirin (Aspirin 81)  Atorvastatin  Baclofen  Celecoxib  Cholecalciferol  Cyanocobalamin  Docusate  Fluoxetine  Hydrocortisone  Lisinopril  Mupirocin  Nitroglycerin  Pregabalin ==================================================================== For clinical consultation, please call [phone]. ====================================================================    No results found for: "CBDTHCR", "D8THCCBX", "D9THCCBX"   Laboratory Chemistry Profile   Renal Lab Results  Component Value Date   BUN 15 06/13/2022   CREATININE 0.71 65/02/5464   BCR NOT APPLICABLE 68/11/7516   GFRNONAA >60 06/13/2022    Hepatic Lab Results  Component Value Date   AST 19 06/08/2022   ALT 16 06/08/2022   ALBUMIN 4.0 06/08/2022   ALKPHOS 80 06/08/2022   LIPASE 25 06/08/2022    Electrolytes Lab Results  Component Value Date   NA 138 06/13/2022   K  4.2 06/13/2022   CL 107 06/13/2022   CALCIUM 8.8 (L) 06/13/2022   MG 2.1 06/13/2022    Bone No results found for: "VD25OH", "VD125OH2TOT", "GY1749SW9", "QP5916BW4", "25OHVITD1", "25OHVITD2", "25OHVITD3", "TESTOFREE", "TESTOSTERONE"  Inflammation (CRP: Acute Phase) (ESR: Chronic Phase) No results found for: "CRP", "ESRSEDRATE", "LATICACIDVEN"       Note: Above Lab results reviewed.   Assessment  The primary encounter diagnosis was Trochanteric bursitis of left hip. Diagnoses of Chronic hip pain, left and Chronic pain syndrome were also pertinent to this visit.  Plan of Care   Logan Vazquez has a current medication list which includes the following long-term medication(s): albuterol, amlodipine, apixaban, atorvastatin, fluoxetine, fluticasone, metoprolol tartrate, nitroglycerin, [START ON 08/25/2022] oxycodone-acetaminophen, oxycodone-acetaminophen, pregabalin, spiriva handihaler, and oxycodone-acetaminophen.  Unfortunately, no significant pain relief after left greater trochanteric bursa injection.  He continues to have tenderness along his greater trochanteric bursa.  He has been trying to rest and ice it.  We will continue to monitor symptoms.  Follow-up as scheduled for medication management visit in October.   Follow-up plan:   Return for Keep sch. appt.    Recent Visits Date Type Provider Dept  07/10/22 Procedure visit Logan Santa, MD [facility]  06/26/22 Office Visit Logan Santa, MD [facility]  Showing recent visits within past 90 days and meeting all other requirements Today's Visits Date Type Provider Dept  08/07/22 Office Visit Logan Santa, MD [facility]  Showing today's visits and meeting all other requirements Future Appointments Date Type Provider Dept  09/24/22 Appointment Logan Santa, MD [facility]  Showing future appointments within next 90 days and meeting all other requirements  I discussed the assessment  and treatment plan with the patient. The patient was provided an opportunity to ask questions and all were answered. The patient agreed with the plan and demonstrated an understanding of the instructions.  Patient advised to call back or seek an in-person evaluation if the symptoms or condition worsens.  Duration of encounter: 16mnutes.  Note by: BGillis Santa MD Date: 08/07/2022; Time: 2:51 PM

## 2022-08-22 ENCOUNTER — Other Ambulatory Visit: Payer: Self-pay | Admitting: Student in an Organized Health Care Education/Training Program

## 2022-08-22 DIAGNOSIS — G894 Chronic pain syndrome: Secondary | ICD-10-CM

## 2022-08-22 DIAGNOSIS — M47894 Other spondylosis, thoracic region: Secondary | ICD-10-CM

## 2022-08-22 DIAGNOSIS — M48061 Spinal stenosis, lumbar region without neurogenic claudication: Secondary | ICD-10-CM

## 2022-08-22 DIAGNOSIS — M47812 Spondylosis without myelopathy or radiculopathy, cervical region: Secondary | ICD-10-CM

## 2022-09-24 ENCOUNTER — Ambulatory Visit
Payer: Medicare HMO | Attending: Student in an Organized Health Care Education/Training Program | Admitting: Student in an Organized Health Care Education/Training Program

## 2022-09-24 ENCOUNTER — Other Ambulatory Visit: Payer: Self-pay

## 2022-09-24 ENCOUNTER — Encounter: Payer: Self-pay | Admitting: Student in an Organized Health Care Education/Training Program

## 2022-09-24 VITALS — BP 127/77 | HR 62 | Temp 97.3°F | Resp 16 | Ht 69.0 in | Wt 107.0 lb

## 2022-09-24 DIAGNOSIS — M47894 Other spondylosis, thoracic region: Secondary | ICD-10-CM | POA: Insufficient documentation

## 2022-09-24 DIAGNOSIS — M47812 Spondylosis without myelopathy or radiculopathy, cervical region: Secondary | ICD-10-CM | POA: Insufficient documentation

## 2022-09-24 DIAGNOSIS — Z0289 Encounter for other administrative examinations: Secondary | ICD-10-CM | POA: Diagnosis present

## 2022-09-24 DIAGNOSIS — M48061 Spinal stenosis, lumbar region without neurogenic claudication: Secondary | ICD-10-CM | POA: Diagnosis present

## 2022-09-24 DIAGNOSIS — G894 Chronic pain syndrome: Secondary | ICD-10-CM | POA: Diagnosis present

## 2022-09-24 DIAGNOSIS — Z79899 Other long term (current) drug therapy: Secondary | ICD-10-CM | POA: Insufficient documentation

## 2022-09-24 DIAGNOSIS — Z8673 Personal history of transient ischemic attack (TIA), and cerebral infarction without residual deficits: Secondary | ICD-10-CM | POA: Diagnosis present

## 2022-09-24 DIAGNOSIS — M7062 Trochanteric bursitis, left hip: Secondary | ICD-10-CM | POA: Diagnosis present

## 2022-09-24 MED ORDER — HYDROCODONE-ACETAMINOPHEN 10-325 MG PO TABS: 1.0000 | ORAL_TABLET | Freq: Four times a day (QID) | ORAL | 0 refills | Status: DC | PRN

## 2022-09-24 MED ORDER — HYDROCODONE-ACETAMINOPHEN 10-325 MG PO TABS: 1.0000 | ORAL_TABLET | Freq: Four times a day (QID) | ORAL | 0 refills | Status: AC | PRN

## 2022-09-24 MED ORDER — PREGABALIN 75 MG PO CAPS: ORAL_CAPSULE | ORAL | 5 refills | Status: DC

## 2022-09-24 MED ORDER — MELATONIN 10 MG PO CAPS: 20.0000 mg | ORAL_CAPSULE | Freq: Every evening | ORAL | 2 refills | Status: AC | PRN

## 2022-09-24 NOTE — Progress Notes (Signed)
PROVIDER NOTE: Information contained herein reflects review and annotations entered in association with encounter. Interpretation of such information and data should be left to medically-trained personnel. Information provided to patient can be located elsewhere in the medical record under "Patient Instructions". Document created using STT-dictation technology, any transcriptional errors that may result from process are unintentional.    Patient: Logan Vazquez  Service Category: E/M  Provider: Gillis Santa, MD  DOB: 25-Jan-1946  DOS: 09/24/2022  Specialty: Interventional Pain Management  MRN: [id number]  Setting: Ambulatory outpatient  PCP: Letta Median, MD  Type: Established Patient    Referring Provider: Letta Median, MD  Location: Office  Delivery: Face-to-face     HPI  Logan Vazquez, a 76 y.o. year old male, is here today because of his Trochanteric bursitis of left hip [M70.62]. Logan Vazquez primary complain today is Hip Pain (Left )  Last encounter: My last encounter with him was on 08/07/22  Pertinent problems: Logan Vazquez has Anxiety disorder; Atherosclerotic heart disease of native coronary artery without angina pectoris; History of lumbar spinal fusion; Lumbar degenerative disc disease; Recurrent major depressive disorder, in partial remission ([place]); Chronic pain syndrome; Long term current use of opiate analgesic; Chronic bilateral low back pain with bilateral sciatica; Chronic hip pain, left; and Spinal stenosis of lumbar region on their pertinent problem list. Pain Assessment: Severity of Chronic pain is reported as a 9 /10. Location: Hip Left/hip pain down the left leg. Onset: More than a month ago. Quality: Discomfort, Constant, Other (Comment) (feels as if it is going to pop loose it hurts so much). Timing: Constant. Modifying factor(s): medications. Vitals:  height is _0  (1.753 m) and weight is 107 lb (48.5 kg). His temporal temperature is 97.3 F (36.3 C)  (abnormal). His blood pressure is 127/77 and his pulse is 62. His respiration is 16 and oxygen saturation is 99%.   Reason for encounter: medication management.    [facility] follows up today for medication management.  Is having increased low back pain as well as left hip pain.  He has a history of lumbar spinal fusion.  He states that his oxycodone has become less effective.  We discussed doing an opioid rotation to hydrocodone given tolerance to oxycodone.  I also recommend that he restart Lyrica.  He states that he has been without his Lyrica for the last 4 to 6 weeks which is another reason that he could be experiencing increased pain.  He is also experiencing pain related insomnia.  I recommend that he start melatonin as below.  We will also perform a serum toxicology screen to assess for medication compliance and monitoring.  Pharmacotherapy Assessment  Analgesic: Percocet 10 mg 3 times daily as needed, quantity 90/month; MME equals 45    Monitoring: [place] PMP: PDMP reviewed during this encounter.       Pharmacotherapy: No side-effects or adverse reactions reported. Compliance: No problems identified. Effectiveness: Clinically acceptable.  UDS:  Summary  Date Value Ref Range Status  01/05/2019 FINAL  Final    Comment:    ==================================================================== TOXASSURE SELECT 13 (MW) ==================================================================== Test                             Result       Flag       Units Drug Present and Declared for Prescription Verification   Oxycodone  1254         EXPECTED   ng/mg creat   Oxymorphone                    310          EXPECTED   ng/mg creat   Noroxycodone                   4592         EXPECTED   ng/mg creat   Noroxymorphone                 247          EXPECTED   ng/mg creat    Sources of oxycodone are scheduled prescription medications.    Oxymorphone, noroxycodone, and noroxymorphone are  expected    metabolites of oxycodone. Oxymorphone is also available as a    scheduled prescription medication. Drug Present not Declared for Prescription Verification   Alprazolam                     15           UNEXPECTED ng/mg creat   Alpha-hydroxyalprazolam        19           UNEXPECTED ng/mg creat    Source of alprazolam is a scheduled prescription medication.    Alpha-hydroxyalprazolam is an expected metabolite of alprazolam. ==================================================================== Test                      Result    Flag   Units      Ref Range   Creatinine              150              mg/dL      >=20 ==================================================================== Declared Medications:  The flagging and interpretation on this report are based on the  following declared medications.  Unexpected results may arise from  inaccuracies in the declared medications.  **Note: The testing scope of this panel includes these medications:  Oxycodone (Oxycodone Acetaminophen)  **Note: The testing scope of this panel does not include following  reported medications:  Acetaminophen (Oxycodone Acetaminophen)  Albuterol  Aspirin (Aspirin 81)  Atorvastatin  Baclofen  Celecoxib  Cholecalciferol  Cyanocobalamin  Docusate  Fluoxetine  Hydrocortisone  Lisinopril  Mupirocin  Nitroglycerin  Pregabalin ==================================================================== For clinical consultation, please call [phone]. ====================================================================       ROS  Constitutional: Denies any fever or chills Gastrointestinal: No reported hemesis, hematochezia, vomiting, or acute GI distress Musculoskeletal: Low back pain with radiation into left hip Neurological: No reported episodes of acute onset apraxia, aphasia, dysarthria, agnosia, amnesia, paralysis, loss of coordination, or loss of consciousness  Medication Review  B-12,  FLUoxetine, HYDROcodone-acetaminophen, Melatonin, albuterol, amLODipine, apixaban, atorvastatin, busPIRone, docusate sodium, fluticasone, hydrocortisone, loperamide, metoprolol tartrate, nitroGLYCERIN, omeprazole, pregabalin, tamsulosin, and tiotropium  History Review  Allergy: Logan Vazquez is allergic to ibuprofen. Drug: Logan Vazquez  reports no history of drug use. Alcohol:  reports no history of alcohol use. Tobacco:  reports that he has been smoking cigarettes. He has a 29.00 pack-year smoking history. He has never used smokeless tobacco. Social: Logan Vazquez  reports that he has been smoking cigarettes. He has a 29.00 pack-year smoking history. He has never used smokeless tobacco. He reports that he does not drink alcohol and does not use drugs. Medical:  has a past medical  history of Arthritis, Asthma, CAD (coronary artery disease), Depression, Emphysema lung ([place]), Glaucoma, and Hypertension. Surgical: Logan Vazquez  has a past surgical history that includes heart surgery (07/1999); Hip surgery (Left, 1968); Back surgery (1985); Skin graft (1968); Humerus fracture surgery (1968); ENDOVASCULAR REPAIR/STENT GRAFT (N/A, 03/22/2022); Coronary/Graft Acute MI Revascularization (N/A, 06/08/2022); and LEFT HEART CATH AND CORONARY ANGIOGRAPHY (N/A, 06/08/2022). Family: family history includes Cancer in his father; Hearing loss in his mother; Heart attack in his brother; Heart disease in his brother; Hypertension in his father and mother; Stroke in his sister.  Laboratory Chemistry Profile   Renal Lab Results  Component Value Date   BUN 15 06/13/2022   CREATININE 0.71 80/99/8338   BCR NOT APPLICABLE 25/04/3975   GFRNONAA >60 06/13/2022     Hepatic Lab Results  Component Value Date   AST 19 06/08/2022   ALT 16 06/08/2022   ALBUMIN 4.0 06/08/2022   ALKPHOS 80 06/08/2022   LIPASE 25 06/08/2022     Electrolytes Lab Results  Component Value Date   NA 138 06/13/2022   K 4.2 06/13/2022   CL 107  06/13/2022   CALCIUM 8.8 (L) 06/13/2022   MG 2.1 06/13/2022     Bone No results found for: "VD25OH", "VD125OH2TOT", "BH4193XT0", "WI0973ZH2", "25OHVITD1", "25OHVITD2", "25OHVITD3", "TESTOFREE", "TESTOSTERONE"   Inflammation (CRP: Acute Phase) (ESR: Chronic Phase) No results found for: "CRP", "ESRSEDRATE", "LATICACIDVEN"     Note: Above Lab results reviewed.   Physical Exam  General appearance: Well nourished, well developed, and well hydrated. In no apparent acute distress Mental status: Alert, oriented x 3 (person, place, & time)       Respiratory: No evidence of acute respiratory distress Eyes: PERLA Vitals: BP 127/77 (BP Location: Right Arm, Patient Position: Sitting, Cuff Size: Normal)   Pulse 62   Temp (!) 97.3 F (36.3 C) (Temporal)   Resp 16   Ht _0  (1.753 m)   Wt 107 lb (48.5 kg)   SpO2 99%   BMI 15.80 kg/m  BMI: Estimated body mass index is 15.8 kg/m as calculated from the following:   Height as of this encounter: _1  (1.753 m).   Weight as of this encounter: 107 lb (48.5 kg). Ideal: Male patients must weigh at least 50 kg to calculate ideal body weight  Cervical Spine Area Exam  Skin & Axial Inspection: No masses, redness, edema, swelling, or associated skin lesions Alignment: Symmetrical Functional ROM: Pain restricted ROM      Stability: No instability detected Muscle Tone/Strength: Functionally intact. No obvious neuro-muscular anomalies detected. Sensory (Neurological): Musculoskeletal pain pattern Palpation: No palpable anomalies               Lumbar Spine Area Exam  Skin & Axial Inspection:  Well healed scar from prior surgery Alignment: Symmetrical Functional ROM: Pain restricted ROM affecting both sides Stability: No instability detected Muscle Tone/Strength: Functionally intact. No obvious neuro-muscular anomalies detected. Sensory (Neurological): MSK and dermatomal   Gait & Posture Assessment  Ambulation: Limited Gait: Significantly  limited. Dependent on assistive device to ambulate Posture: Difficulty standing up straight, due to pain  Lower Extremity Exam      Side: Right lower extremity   Side: Left lower extremity  Stability: No instability observed           Stability: No instability observed          Skin & Extremity Inspection: Skin color, temperature, and hair growth are WNL. No peripheral edema or cyanosis. No masses, redness, swelling,  asymmetry, or associated skin lesions. No contractures.   Skin & Extremity Inspection: Skin color, temperature, and hair growth are WNL. No peripheral edema or cyanosis. No masses, redness, swelling, asymmetry, or associated skin lesions. No contractures.  Functional ROM: Decreased ROM for hip and knee joints           Functional ROM: Decreased ROM for hip and knee joints          Muscle Tone/Strength: Functionally intact. No obvious neuro-muscular anomalies detected.   Muscle Tone/Strength: Functionally intact. No obvious neuro-muscular anomalies detected.  Sensory (Neurological): Dermatomal pain pattern on the right       Sensory (Neurological): Musculoskeletal pain pattern of the left hip       DTR: Patellar: deferred today Achilles: deferred today Plantar: deferred today   DTR: Patellar: deferred today Achilles: deferred today Plantar: deferred today  Palpation: No palpable anomalies   Palpation: left GTB TTP    4 out of 5 strength bilateral lower extremity: Plantar flexion, dorsiflexion, knee flexion, knee extension.   Assessment   Status Diagnosis  Persistent Persistent Controlled 1. Trochanteric bursitis of left hip   2. History of CVA (cerebrovascular accident)   3. Pain medication agreement   4. Spinal stenosis of lumbar region, unspecified whether neurogenic claudication present   5. Cervical facet joint syndrome   6. Controlled substance agreement signed   7. Chronic pain syndrome   8. Thoracic facet joint syndrome         Plan of Care   Logan Vazquez has a current medication list which includes the following long-term medication(s): albuterol, amlodipine, apixaban, atorvastatin, fluoxetine, fluticasone, metoprolol tartrate, nitroglycerin, spiriva handihaler, and pregabalin.  1. Trochanteric bursitis of left hip - HYDROcodone-acetaminophen (NORCO) 10-325 MG tablet; Take 1 tablet by mouth every 6 (six) hours as needed for severe pain. Must last 30 days.  Dispense: 120 tablet; Refill: 0 - HYDROcodone-acetaminophen (NORCO) 10-325 MG tablet; Take 1 tablet by mouth every 6 (six) hours as needed for severe pain. Must last 30 days.  Dispense: 120 tablet; Refill: 0 - HYDROcodone-acetaminophen (NORCO) 10-325 MG tablet; Take 1 tablet by mouth every 6 (six) hours as needed for severe pain. Must last 30 days.  Dispense: 120 tablet; Refill: 0  2. History of CVA (cerebrovascular accident) - HYDROcodone-acetaminophen (NORCO) 10-325 MG tablet; Take 1 tablet by mouth every 6 (six) hours as needed for severe pain. Must last 30 days.  Dispense: 120 tablet; Refill: 0 - HYDROcodone-acetaminophen (NORCO) 10-325 MG tablet; Take 1 tablet by mouth every 6 (six) hours as needed for severe pain. Must last 30 days.  Dispense: 120 tablet; Refill: 0 - HYDROcodone-acetaminophen (NORCO) 10-325 MG tablet; Take 1 tablet by mouth every 6 (six) hours as needed for severe pain. Must last 30 days.  Dispense: 120 tablet; Refill: 0  3. Pain medication agreement - HYDROcodone-acetaminophen (NORCO) 10-325 MG tablet; Take 1 tablet by mouth every 6 (six) hours as needed for severe pain. Must last 30 days.  Dispense: 120 tablet; Refill: 0 - HYDROcodone-acetaminophen (NORCO) 10-325 MG tablet; Take 1 tablet by mouth every 6 (six) hours as needed for severe pain. Must last 30 days.  Dispense: 120 tablet; Refill: 0 - HYDROcodone-acetaminophen (NORCO) 10-325 MG tablet; Take 1 tablet by mouth every 6 (six) hours as needed for severe pain. Must last 30 days.  Dispense: 120 tablet; Refill:  0  4. Spinal stenosis of lumbar region, unspecified whether neurogenic claudication present - HYDROcodone-acetaminophen (NORCO) 10-325 MG tablet; Take 1 tablet  by mouth every 6 (six) hours as needed for severe pain. Must last 30 days.  Dispense: 120 tablet; Refill: 0 - HYDROcodone-acetaminophen (NORCO) 10-325 MG tablet; Take 1 tablet by mouth every 6 (six) hours as needed for severe pain. Must last 30 days.  Dispense: 120 tablet; Refill: 0 - HYDROcodone-acetaminophen (NORCO) 10-325 MG tablet; Take 1 tablet by mouth every 6 (six) hours as needed for severe pain. Must last 30 days.  Dispense: 120 tablet; Refill: 0 - pregabalin (LYRICA) 75 MG capsule; 75 mg qAM, 150 mg qhs  Dispense: 90 capsule; Refill: 5  5. Cervical facet joint syndrome - pregabalin (LYRICA) 75 MG capsule; 75 mg qAM, 150 mg qhs  Dispense: 90 capsule; Refill: 5  6. Controlled substance agreement signed  7. Chronic pain syndrome - HYDROcodone-acetaminophen (NORCO) 10-325 MG tablet; Take 1 tablet by mouth every 6 (six) hours as needed for severe pain. Must last 30 days.  Dispense: 120 tablet; Refill: 0 - HYDROcodone-acetaminophen (NORCO) 10-325 MG tablet; Take 1 tablet by mouth every 6 (six) hours as needed for severe pain. Must last 30 days.  Dispense: 120 tablet; Refill: 0 - HYDROcodone-acetaminophen (NORCO) 10-325 MG tablet; Take 1 tablet by mouth every 6 (six) hours as needed for severe pain. Must last 30 days.  Dispense: 120 tablet; Refill: 0 - pregabalin (LYRICA) 75 MG capsule; 75 mg qAM, 150 mg qhs  Dispense: 90 capsule; Refill: 5 - Drug Screen 10 W/Conf, Serum  8. Thoracic facet joint syndrome - pregabalin (LYRICA) 75 MG capsule; 75 mg qAM, 150 mg qhs  Dispense: 90 capsule; Refill: 5   Pharmacotherapy (Medications Ordered): Meds ordered this encounter  Medications   HYDROcodone-acetaminophen (NORCO) 10-325 MG tablet    Sig: Take 1 tablet by mouth every 6 (six) hours as needed for severe pain. Must last 30 days.     Dispense:  120 tablet    Refill:  0    Chronic Pain: STOP Act (Not applicable) Fill 1 day early if closed on refill date. Avoid benzodiazepines within 8 hours of opioids   HYDROcodone-acetaminophen (NORCO) 10-325 MG tablet    Sig: Take 1 tablet by mouth every 6 (six) hours as needed for severe pain. Must last 30 days.    Dispense:  120 tablet    Refill:  0    Chronic Pain: STOP Act (Not applicable) Fill 1 day early if closed on refill date. Avoid benzodiazepines within 8 hours of opioids   HYDROcodone-acetaminophen (NORCO) 10-325 MG tablet    Sig: Take 1 tablet by mouth every 6 (six) hours as needed for severe pain. Must last 30 days.    Dispense:  120 tablet    Refill:  0    Chronic Pain: STOP Act (Not applicable) Fill 1 day early if closed on refill date. Avoid benzodiazepines within 8 hours of opioids   pregabalin (LYRICA) 75 MG capsule    Sig: 75 mg qAM, 150 mg qhs    Dispense:  90 capsule    Refill:  5   Melatonin 10 MG CAPS    Sig: Take 20 mg by mouth at bedtime as needed.    Dispense:  30 capsule    Refill:  2    Do not add to the electronic "Automatic Refill" notification system. Patient may have prescription filled one day early if pharmacy is closed on scheduled refill date.     Orders Placed This Encounter  Procedures   Drug Screen 10 W/Conf, Serum    Order Specific Question:  Release to patient    Answer:   Immediate    Follow-up plan:   Return in about 3 months (around 12/25/2022) for Medication Management, in person.   Recent Visits Date Type Provider Dept  08/07/22 Office Visit Gillis Santa, MD [facility]  07/10/22 Procedure visit Gillis Santa, MD [facility]  06/26/22 Office Visit Gillis Santa, MD [facility]  Showing recent visits within past 90 days and meeting all other requirements Today's Visits Date Type Provider Dept  09/24/22 Office Visit Gillis Santa, MD [facility]  Showing today's visits and meeting all  other requirements Future Appointments Date Type Provider Dept  12/17/22 Appointment Gillis Santa, MD [facility]  Showing future appointments within next 90 days and meeting all other requirements  I discussed the assessment and treatment plan with the patient. The patient was provided an opportunity to ask questions and all were answered. The patient agreed with the plan and demonstrated an understanding of the instructions.  Patient advised to call back or seek an in-person evaluation if the symptoms or condition worsens.  Duration of encounter: 63mnutes.  Note by: BGillis Santa MD Date: 09/24/2022; Time: 3:14 PM

## 2022-09-24 NOTE — Progress Notes (Signed)
Nursing Pain Medication Assessment:  Safety precautions to be maintained throughout the outpatient stay will include: orient to surroundings, keep bed in low position, maintain call bell within reach at all times, provide assistance with transfer out of bed and ambulation.  Medication Inspection Compliance:  empty bottle   Medication: Oxycodone/APAP Pill/Patch Count:  0 of 90 pills remain Pill/Patch Appearance:  empty bottle  Bottle Appearance: Standard pharmacy container. Clearly labeled. Filled Date: 08 / 14 / 2023 Last Medication intake:  Ran out of medicine more than 48 hours ago  Patient reports he also has a September bottle at home that is also empty.  PMP shows last fill 08/29/22 qty 90

## 2022-09-25 ENCOUNTER — Other Ambulatory Visit: Payer: Self-pay | Admitting: *Deleted

## 2022-09-25 ENCOUNTER — Telehealth: Payer: Self-pay | Admitting: Student in an Organized Health Care Education/Training Program

## 2022-09-25 DIAGNOSIS — M48061 Spinal stenosis, lumbar region without neurogenic claudication: Secondary | ICD-10-CM

## 2022-09-25 DIAGNOSIS — G894 Chronic pain syndrome: Secondary | ICD-10-CM

## 2022-09-25 DIAGNOSIS — M7062 Trochanteric bursitis, left hip: Secondary | ICD-10-CM

## 2022-09-25 DIAGNOSIS — Z8673 Personal history of transient ischemic attack (TIA), and cerebral infarction without residual deficits: Secondary | ICD-10-CM

## 2022-09-25 DIAGNOSIS — Z0289 Encounter for other administrative examinations: Secondary | ICD-10-CM

## 2022-09-25 MED ORDER — HYDROCODONE-ACETAMINOPHEN 10-325 MG PO TABS: 1.0000 | ORAL_TABLET | Freq: Four times a day (QID) | ORAL | 0 refills | Status: DC | PRN

## 2022-09-25 NOTE — Telephone Encounter (Signed)
PT stated that when he went to get prescription on yesterday. Pharmacy stated that he couldn't get refill until Nov 9. PT stated that he doesn't have any meds for Oct. Please give patient a call. Thanks

## 2022-09-25 NOTE — Telephone Encounter (Signed)
Rx request sent to Dr. Lateef 

## 2022-09-25 NOTE — Telephone Encounter (Signed)
Called [facility], they cannot find the script for Oxycodone for 09-24-22.

## 2022-09-26 ENCOUNTER — Telehealth: Payer: Self-pay | Admitting: Student in an Organized Health Care Education/Training Program

## 2022-09-26 ENCOUNTER — Telehealth: Payer: Self-pay | Admitting: *Deleted

## 2022-09-26 DIAGNOSIS — M7062 Trochanteric bursitis, left hip: Secondary | ICD-10-CM

## 2022-09-26 DIAGNOSIS — Z0289 Encounter for other administrative examinations: Secondary | ICD-10-CM

## 2022-09-26 DIAGNOSIS — M48061 Spinal stenosis, lumbar region without neurogenic claudication: Secondary | ICD-10-CM

## 2022-09-26 DIAGNOSIS — Z8673 Personal history of transient ischemic attack (TIA), and cerebral infarction without residual deficits: Secondary | ICD-10-CM

## 2022-09-26 DIAGNOSIS — G894 Chronic pain syndrome: Secondary | ICD-10-CM

## 2022-09-26 NOTE — Telephone Encounter (Signed)
PT stated that he called the numbers that he was giving by the nurse of some pharmacy to see if they had medications in stock that he needs. PT stated that he wasn't able to get anyone to answer.

## 2022-09-26 NOTE — Telephone Encounter (Signed)
This is taken care in another phone call, see note.

## 2022-09-26 NOTE — Telephone Encounter (Signed)
Patient called back and we suggested [facility].  Called and they do have qty.  I have sent a med refill request to [facility] and he is aware.  He states he will send it tomorrow for patient. Patient aware.

## 2022-09-26 NOTE — Telephone Encounter (Signed)
Message sent to [facility] ?

## 2022-09-26 NOTE — Telephone Encounter (Signed)
Attempted to reach patient, no answer and no voice mail.

## 2022-09-26 NOTE — Telephone Encounter (Signed)
Pharmacy states Norco is on back order. They do have percocet 10-325. Would Dr. Holley Raring be willing to change meds to this ? Please call pharmacy

## 2022-09-26 NOTE — Telephone Encounter (Signed)
Called patient back to let him know that [facility] did not want to change the Rx.  Asked if he could find a pharmacy that may have his medication. Patient reports that he doesn't know what is going on around there and does not know who to call.  Called pharmacy to find out if the status of the medication has changed and they report that it is still on backorder.  She states she did check on where he maybe able to get the medicine and she did not find anywhere that might have it.  Asked if she had any idea as to when they may be get the medicine and that is uncertain.

## 2022-09-27 ENCOUNTER — Telehealth (INDEPENDENT_AMBULATORY_CARE_PROVIDER_SITE_OTHER): Payer: Self-pay | Admitting: Nurse Practitioner

## 2022-09-27 MED ORDER — HYDROCODONE-ACETAMINOPHEN 10-325 MG PO TABS: 1.0000 | ORAL_TABLET | Freq: Four times a day (QID) | ORAL | 0 refills | Status: AC | PRN

## 2022-09-27 NOTE — Telephone Encounter (Signed)
Patient called and stated he did not get his pain medication refilled.  Please advise.

## 2022-09-27 NOTE — Telephone Encounter (Addendum)
The patient left 2 voice mails wanting someone to call him. He said [facility] doesn't have his medicine either and he is tired of hurting.   He called back just now and wants a nurse to call him

## 2022-09-27 NOTE — Telephone Encounter (Signed)
Pt called wrong dr's office thought he was calling pain management.

## 2022-09-28 LAB — DRUG SCREEN 10 W/CONF, SERUM
Amphetamines, IA: NEGATIVE ng/mL
Barbiturates, IA: NEGATIVE ug/mL
Benzodiazepines, IA: NEGATIVE ng/mL
Cocaine & Metabolite, IA: NEGATIVE ng/mL
Methadone, IA: NEGATIVE ng/mL
Opiates, IA: NEGATIVE ng/mL
Oxycodones, IA: NEGATIVE ng/mL
Phencyclidine, IA: NEGATIVE ng/mL
Propoxyphene, IA: NEGATIVE ng/mL
THC(Marijuana) Metabolite, IA: NEGATIVE ng/mL

## 2022-10-01 ENCOUNTER — Other Ambulatory Visit: Payer: Self-pay | Admitting: Student in an Organized Health Care Education/Training Program

## 2022-10-01 DIAGNOSIS — M48061 Spinal stenosis, lumbar region without neurogenic claudication: Secondary | ICD-10-CM

## 2022-10-01 DIAGNOSIS — M47812 Spondylosis without myelopathy or radiculopathy, cervical region: Secondary | ICD-10-CM

## 2022-10-01 DIAGNOSIS — M47894 Other spondylosis, thoracic region: Secondary | ICD-10-CM

## 2022-10-01 DIAGNOSIS — G894 Chronic pain syndrome: Secondary | ICD-10-CM

## 2022-10-03 ENCOUNTER — Other Ambulatory Visit: Payer: Self-pay | Admitting: Student in an Organized Health Care Education/Training Program

## 2022-10-03 DIAGNOSIS — M47812 Spondylosis without myelopathy or radiculopathy, cervical region: Secondary | ICD-10-CM

## 2022-10-03 DIAGNOSIS — M47894 Other spondylosis, thoracic region: Secondary | ICD-10-CM

## 2022-10-03 DIAGNOSIS — G894 Chronic pain syndrome: Secondary | ICD-10-CM

## 2022-10-03 DIAGNOSIS — M48061 Spinal stenosis, lumbar region without neurogenic claudication: Secondary | ICD-10-CM

## 2022-10-21 ENCOUNTER — Other Ambulatory Visit: Payer: Self-pay | Admitting: Student in an Organized Health Care Education/Training Program

## 2022-10-21 ENCOUNTER — Other Ambulatory Visit: Payer: Self-pay | Admitting: *Deleted

## 2022-10-21 ENCOUNTER — Telehealth: Payer: Self-pay | Admitting: Student in an Organized Health Care Education/Training Program

## 2022-10-21 DIAGNOSIS — M7062 Trochanteric bursitis, left hip: Secondary | ICD-10-CM

## 2022-10-21 MED ORDER — TIZANIDINE HCL 4 MG PO TABS: 4.0000 mg | ORAL_TABLET | Freq: Two times a day (BID) | ORAL | 5 refills | Status: AC | PRN

## 2022-10-21 NOTE — Telephone Encounter (Signed)
Patient states his pharmacy does not have his pain meds. I explained he will need to call and find a pharmacy that has meds and call back.

## 2022-10-22 ENCOUNTER — Other Ambulatory Visit: Payer: Self-pay | Admitting: Student in an Organized Health Care Education/Training Program

## 2022-10-22 DIAGNOSIS — G894 Chronic pain syndrome: Secondary | ICD-10-CM

## 2022-10-22 DIAGNOSIS — M48061 Spinal stenosis, lumbar region without neurogenic claudication: Secondary | ICD-10-CM

## 2022-10-22 DIAGNOSIS — M7062 Trochanteric bursitis, left hip: Secondary | ICD-10-CM

## 2022-10-22 DIAGNOSIS — Z0289 Encounter for other administrative examinations: Secondary | ICD-10-CM

## 2022-10-22 DIAGNOSIS — Z8673 Personal history of transient ischemic attack (TIA), and cerebral infarction without residual deficits: Secondary | ICD-10-CM

## 2022-10-22 NOTE — Telephone Encounter (Signed)
Called [facility], they do have the prescription and have the med in stock. Patient notified of this.

## 2022-10-22 NOTE — Telephone Encounter (Signed)
He called back twice. I transferred him to [facility] and they don't have it.  He doesn't have a phone book and can't do this himself. Any suggestions?

## 2022-11-26 ENCOUNTER — Telehealth: Payer: Self-pay | Admitting: Student in an Organized Health Care Education/Training Program

## 2022-11-26 ENCOUNTER — Other Ambulatory Visit: Payer: Self-pay

## 2022-11-26 DIAGNOSIS — Z0289 Encounter for other administrative examinations: Secondary | ICD-10-CM

## 2022-11-26 DIAGNOSIS — M48061 Spinal stenosis, lumbar region without neurogenic claudication: Secondary | ICD-10-CM

## 2022-11-26 DIAGNOSIS — M7062 Trochanteric bursitis, left hip: Secondary | ICD-10-CM

## 2022-11-26 DIAGNOSIS — Z8673 Personal history of transient ischemic attack (TIA), and cerebral infarction without residual deficits: Secondary | ICD-10-CM

## 2022-11-26 DIAGNOSIS — G894 Chronic pain syndrome: Secondary | ICD-10-CM

## 2022-11-26 NOTE — Telephone Encounter (Signed)
Its the Hydrocodone script from Dr Cherylann Ratel dated 11-23-22

## 2022-11-26 NOTE — Telephone Encounter (Signed)
Attempted to call patient, Phone rang approximately 20 times with no answer.  I wanted to know which medication he was referring to and tell him that his pharm can transfer his medication to [facility].

## 2022-11-26 NOTE — Telephone Encounter (Signed)
Sent refill request to Dr Laban Emperor

## 2022-11-26 NOTE — Telephone Encounter (Signed)
[facility] says they do not have patients pain meds. Patient is asking to have script sent to [facility]. He does not want to use [facility] any longer. Wants to make [facility] his pharmacy. Please advise patient

## 2022-11-27 ENCOUNTER — Telehealth: Payer: Self-pay | Admitting: Student in an Organized Health Care Education/Training Program

## 2022-11-27 MED ORDER — HYDROCODONE-ACETAMINOPHEN 10-325 MG PO TABS: 1.0000 | ORAL_TABLET | Freq: Four times a day (QID) | ORAL | 0 refills | Status: DC | PRN

## 2022-11-27 NOTE — Telephone Encounter (Signed)
PT stated that he wasn't sure as to which pharmacy his prescription was send to. PT stated that he called [facility] it hadn't been send. Please give patient a call. Thanks

## 2022-11-27 NOTE — Telephone Encounter (Signed)
Tried to call patient back. No answer and no way to leave voicemail. His script is at [facility].

## 2022-12-17 ENCOUNTER — Encounter: Payer: Self-pay | Admitting: Student in an Organized Health Care Education/Training Program

## 2022-12-17 ENCOUNTER — Telehealth: Payer: Self-pay | Admitting: Student in an Organized Health Care Education/Training Program

## 2022-12-17 ENCOUNTER — Ambulatory Visit
Payer: Medicare HMO | Attending: Student in an Organized Health Care Education/Training Program | Admitting: Student in an Organized Health Care Education/Training Program

## 2022-12-17 VITALS — BP 139/83 | HR 63 | Temp 97.2°F | Resp 16 | Ht 69.0 in | Wt 107.0 lb

## 2022-12-17 DIAGNOSIS — Z79899 Other long term (current) drug therapy: Secondary | ICD-10-CM | POA: Insufficient documentation

## 2022-12-17 DIAGNOSIS — M5416 Radiculopathy, lumbar region: Secondary | ICD-10-CM | POA: Insufficient documentation

## 2022-12-17 DIAGNOSIS — G894 Chronic pain syndrome: Secondary | ICD-10-CM | POA: Insufficient documentation

## 2022-12-17 DIAGNOSIS — Z79891 Long term (current) use of opiate analgesic: Secondary | ICD-10-CM | POA: Diagnosis present

## 2022-12-17 DIAGNOSIS — M5136 Other intervertebral disc degeneration, lumbar region: Secondary | ICD-10-CM | POA: Diagnosis present

## 2022-12-17 DIAGNOSIS — M47894 Other spondylosis, thoracic region: Secondary | ICD-10-CM

## 2022-12-17 DIAGNOSIS — G8929 Other chronic pain: Secondary | ICD-10-CM | POA: Diagnosis present

## 2022-12-17 DIAGNOSIS — M48061 Spinal stenosis, lumbar region without neurogenic claudication: Secondary | ICD-10-CM | POA: Diagnosis present

## 2022-12-17 MED ORDER — HYDROCODONE-ACETAMINOPHEN 10-325 MG PO TABS: 1.0000 | ORAL_TABLET | Freq: Three times a day (TID) | ORAL | 0 refills | Status: AC | PRN

## 2022-12-17 MED ORDER — BUPRENORPHINE 7.5 MCG/HR TD PTWK: 1.0000 | MEDICATED_PATCH | TRANSDERMAL | 0 refills | Status: AC

## 2022-12-17 MED ORDER — BUPRENORPHINE 10 MCG/HR TD PTWK: 1.0000 | MEDICATED_PATCH | TRANSDERMAL | 0 refills | Status: AC

## 2022-12-17 NOTE — Telephone Encounter (Signed)
PT sister that the pharmacy needs a PA autho. Please give pharmacy a call. Thanks

## 2022-12-17 NOTE — Progress Notes (Signed)
PROVIDER NOTE: Information contained herein reflects review and annotations entered in association with encounter. Interpretation of such information and data should be left to medically-trained personnel. Information provided to patient can be located elsewhere in the medical record under "Patient Instructions". Document created using STT-dictation technology, any transcriptional errors that may result from process are unintentional.    Patient: Logan Vazquez  Service Category: E/M  Provider: Gillis Santa, MD  DOB: 09/12/1946  DOS: 12/17/2022  Specialty: Interventional Pain Management  MRN: [id number]  Setting: Ambulatory outpatient  PCP: Letta Median, MD  Type: Established Patient    Referring Provider: Letta Median, MD  Location: Office  Delivery: Face-to-face     HPI  [name], a 77 y.o. year old male, is here today because of his Spinal stenosis of lumbar region, unspecified whether neurogenic claudication present [M48.061]. Logan Vazquez primary complain today is Back Pain (Lumbar bilateral )  Last encounter: My last encounter with him was on 09/24/22  Pertinent problems: Logan Vazquez has Anxiety disorder; Atherosclerotic heart disease of native coronary artery without angina pectoris; History of lumbar spinal fusion; Lumbar degenerative disc disease; Recurrent major depressive disorder, in partial remission ([place]); Chronic pain syndrome; Long term current use of opiate analgesic; Chronic bilateral low back pain with bilateral sciatica; Chronic hip pain, left; and Spinal stenosis of lumbar region on their pertinent problem list. Pain Assessment: Severity of Chronic pain is reported as a 9 /10. Location: Back Lower, Left, Right/back pain into hips and legs. Onset: More than a month ago. Quality: Discomfort, Constant, Sharp, Aching. Timing: Constant. Modifying factor(s): medications, all though does not seem like they are strong enough. Vitals:  height is _0  (1.753 m) and  weight is 107 lb (48.5 kg). His temporal temperature is 97.2 F (36.2 C) (abnormal). His blood pressure is 139/83 and his pulse is 63. His respiration is 16 and oxygen saturation is 100%.   Reason for encounter: medication management.    Logan Vazquez follows up today for medication management.  He continues to endorse low back pain as well as left hip pain.  He has a history of lumbar spinal fusion.  He is short on his hydrocodone by approximately 10 days.  He admits to overtaking medications to help facilitate pain management.  He has done this in the past as well.  I believe that Logan Vazquez is experiencing opioid tolerance.  We discussed transitioning to buprenorphine via Butrans transdermal pain patch as a long-acting analgesic agent.  I will reduce his hydrocodone to every 8 hours as needed.  If he does find some benefit with buprenorphine and is not experiencing any side effects, our long-term goal is to increase his buprenorphine dose and get that therapeutic and hopefully decrease his hydrocodone so that he is taking it only for breakthrough pain, no more than 2 tablets a day.  This was also explained to his sister in detail who accompanied him today during the visit.  Logan Vazquez was in agreement with the plan.  Pharmacotherapy Assessment  Analgesic: Addition of Butrans patch at 7.5 mcg for the first month then increase to 10 mcg.  Decrease hydrocodone to 10 mg every 8 hours as needed.    Monitoring: [place] PMP: PDMP reviewed during this encounter.       Pharmacotherapy: No side-effects or adverse reactions reported. Compliance: No problems identified. Effectiveness: Clinically acceptable.  UDS:  Summary  Date Value Ref Range Status  01/05/2019 FINAL  Final    Comment:    ====================================================================  TOXAS[facility] SELECT 13 (MW) ==================================================================== Test                             Result       Flag        Units Drug Present and Declared for Prescription Verification   Oxycodone                      1254         EXPECTED   ng/mg creat   Oxymorphone                    310          EXPECTED   ng/mg creat   Noroxycodone                   4592         EXPECTED   ng/mg creat   Noroxymorphone                 247          EXPECTED   ng/mg creat    Sources of oxycodone are scheduled prescription medications.    Oxymorphone, noroxycodone, and noroxymorphone are expected    metabolites of oxycodone. Oxymorphone is also available as a    scheduled prescription medication. Drug Present not Declared for Prescription Verification   Alprazolam                     15           UNEXPECTED ng/mg creat   Alpha-hydroxyalprazolam        19           UNEXPECTED ng/mg creat    Source of alprazolam is a scheduled prescription medication.    Alpha-hydroxyalprazolam is an expected metabolite of alprazolam. ==================================================================== Test                      Result    Flag   Units      Ref Range   Creatinine              150              mg/dL      >=20 ==================================================================== Declared Medications:  The flagging and interpretation on this report are based on the  following declared medications.  Unexpected results may arise from  inaccuracies in the declared medications.  **Note: The testing scope of this panel includes these medications:  Oxycodone (Oxycodone Acetaminophen)  **Note: The testing scope of this panel does not include following  reported medications:  Acetaminophen (Oxycodone Acetaminophen)  Albuterol  Aspirin (Aspirin 81)  Atorvastatin  Baclofen  Celecoxib  Cholecalciferol  Cyanocobalamin  Docusate  Fluoxetine  Hydrocortisone  Lisinopril  Mupirocin  Nitroglycerin  Pregabalin ==================================================================== For clinical consultation, please call [phone]. ====================================================================       ROS  Constitutional: Denies any fever or chills Gastrointestinal: No reported hemesis, hematochezia, vomiting, or acute GI distress Musculoskeletal: Low back pain with radiation into left hip, bilateral lower extremity weakness (chronic) Neurological: No reported episodes of acute onset apraxia, aphasia, dysarthria, agnosia, amnesia, paralysis, loss of coordination, or loss of consciousness  Medication Review  B-12, HYDROcodone-acetaminophen, albuterol, amLODipine, atorvastatin, buprenorphine, busPIRone, clopidogrel, loperamide, nitroGLYCERIN, and tiZANidine  History Review  Allergy: Logan Vazquez is allergic to ibuprofen. Drug: Logan Vazquez  reports no history of  drug use. Alcohol:  reports no history of alcohol use. Tobacco:  reports that he has been smoking cigarettes. He has a 29.00 pack-year smoking history. He has never used smokeless tobacco. Social: Logan Vazquez  reports that he has been smoking cigarettes. He has a 29.00 pack-year smoking history. He has never used smokeless tobacco. He reports that he does not drink alcohol and does not use drugs. Medical:  has a past medical history of Arthritis, Asthma, CAD (coronary artery disease), Depression, Emphysema lung ([place]), Glaucoma, and Hypertension. Surgical: Logan Vazquez  has a past surgical history that includes heart surgery (07/1999); Hip surgery (Left, 1968); Back surgery (1985); Skin graft (1968); Humerus fracture surgery (1968); ENDOVASCULAR REPAIR/STENT GRAFT (N/A, 03/22/2022); Coronary/Graft Acute MI Revascularization (N/A, 06/08/2022); and LEFT HEART CATH AND CORONARY ANGIOGRAPHY (N/A, 06/08/2022). Family: family history includes Cancer in his father; Hearing loss in his mother; Heart attack in his brother; Heart disease in his brother; Hypertension in his father and mother; Stroke in his sister.  Laboratory Chemistry Profile   Renal Lab Results   Component Value Date   BUN 15 06/13/2022   CREATININE 0.71 60/73/7106   BCR NOT APPLICABLE 26/94/8546   GFRNONAA >60 06/13/2022     Hepatic Lab Results  Component Value Date   AST 19 06/08/2022   ALT 16 06/08/2022   ALBUMIN 4.0 06/08/2022   ALKPHOS 80 06/08/2022   LIPASE 25 06/08/2022     Electrolytes Lab Results  Component Value Date   NA 138 06/13/2022   K 4.2 06/13/2022   CL 107 06/13/2022   CALCIUM 8.8 (L) 06/13/2022   MG 2.1 06/13/2022     Bone No results found for: "VD25OH", "VD125OH2TOT", "EV0350KX3", "GH8299BZ1", "25OHVITD1", "25OHVITD2", "25OHVITD3", "TESTOFREE", "TESTOSTERONE"   Inflammation (CRP: Acute Phase) (ESR: Chronic Phase) No results found for: "CRP", "ESRSEDRATE", "LATICACIDVEN"     Note: Above Lab results reviewed.   Physical Exam  General appearance: Well nourished, well developed, and well hydrated. In no apparent acute distress Mental status: Alert, oriented x 3 (person, place, & time)       Respiratory: No evidence of acute respiratory distress Eyes: PERLA Vitals: BP 139/83 (BP Location: Right Arm, Patient Position: Sitting, Cuff Size: Normal)   Pulse 63   Temp (!) 97.2 F (36.2 C) (Temporal)   Resp 16   Ht _0  (1.753 m)   Wt 107 lb (48.5 kg)   SpO2 100%   BMI 15.80 kg/m  BMI: Estimated body mass index is 15.8 kg/m as calculated from the following:   Height as of this encounter: _1  (1.753 m).   Weight as of this encounter: 107 lb (48.5 kg). Ideal: Male patients must weigh at least 50 kg to calculate ideal body weight  Cervical Spine Area Exam  Skin & Axial Inspection: No masses, redness, edema, swelling, or associated skin lesions Alignment: Symmetrical Functional ROM: Pain restricted ROM      Stability: No instability detected Muscle Tone/Strength: Functionally intact. No obvious neuro-muscular anomalies detected. Sensory (Neurological): Musculoskeletal pain pattern Palpation: No palpable anomalies               Lumbar  Spine Area Exam  Skin & Axial Inspection:  Well healed scar from prior surgery Alignment: Symmetrical Functional ROM: Pain restricted ROM affecting both sides Stability: No instability detected Muscle Tone/Strength: Functionally intact. No obvious neuro-muscular anomalies detected. Sensory (Neurological): MSK and dermatomal   Gait & Posture Assessment  Ambulation: Limited Gait: Significantly limited. Dependent on assistive device to ambulate Posture:  Difficulty standing up straight, due to pain  Lower Extremity Exam      Side: Right lower extremity   Side: Left lower extremity  Stability: No instability observed           Stability: No instability observed          Skin & Extremity Inspection: Skin color, temperature, and hair growth are WNL. No peripheral edema or cyanosis. No masses, redness, swelling, asymmetry, or associated skin lesions. No contractures.   Skin & Extremity Inspection: Skin color, temperature, and hair growth are WNL. No peripheral edema or cyanosis. No masses, redness, swelling, asymmetry, or associated skin lesions. No contractures.  Functional ROM: Decreased ROM for hip and knee joints           Functional ROM: Decreased ROM for hip and knee joints          Muscle Tone/Strength: Functionally intact. No obvious neuro-muscular anomalies detected.   Muscle Tone/Strength: Functionally intact. No obvious neuro-muscular anomalies detected.  Sensory (Neurological): Dermatomal pain pattern on the right       Sensory (Neurological): Musculoskeletal pain pattern of the left hip       DTR: Patellar: deferred today Achilles: deferred today Plantar: deferred today   DTR: Patellar: deferred today Achilles: deferred today Plantar: deferred today  Palpation: No palpable anomalies   Palpation: left GTB TTP    4 out of 5 strength bilateral lower extremity: Plantar flexion, dorsiflexion, knee flexion, knee extension.   Assessment   Status Diagnosis   Persistent Persistent Controlled 1. Spinal stenosis of lumbar region, unspecified whether neurogenic claudication present   2. Controlled substance agreement signed   3. Other intervertebral disc degeneration, lumbar region   4. Chronic radicular lumbar pain   5. Thoracic facet joint syndrome   6. Chronic pain syndrome   7. Long term current use of opiate analgesic         Plan of Care   Logan Vazquez has a current medication list which includes the following long-term medication(s): albuterol, amlodipine, atorvastatin, and nitroglycerin.  1. Spinal stenosis of lumbar region, unspecified whether neurogenic claudication present  2. Controlled substance agreement signed  3. Other intervertebral disc degeneration, lumbar region  4. Chronic radicular lumbar pain  5. Thoracic facet joint syndrome  6. Chronic pain syndrome  7. Long term current use of opiate analgesic   Pharmacotherapy (Medications Ordered): Meds ordered this encounter  Medications   buprenorphine (BUTRANS) 7.5 MCG/HR    Sig: Place 1 patch onto the skin once a week for 28 days.    Dispense:  4 patch    Refill:  0    Chronic Pain: STOP Act (Not applicable) Fill 1 day early if closed on refill date. Avoid benzodiazepines within 8 hours of opioids   buprenorphine (BUTRANS) 10 MCG/HR PTWK    Sig: Place 1 patch onto the skin once a week for 28 days.    Dispense:  4 patch    Refill:  0    Chronic Pain: STOP Act (Not applicable) Fill 1 day early if closed on refill date. Avoid benzodiazepines within 8 hours of opioids   HYDROcodone-acetaminophen (NORCO) 10-325 MG tablet    Sig: Take 1 tablet by mouth every 8 (eight) hours as needed for severe pain. Must last 30 days.    Dispense:  90 tablet    Refill:  0    Chronic Pain: STOP Act (Not applicable) Fill 1 day early if closed on refill date. Avoid benzodiazepines within  8 hours of opioids   HYDROcodone-acetaminophen (NORCO) 10-325 MG tablet    Sig: Take 1  tablet by mouth every 8 (eight) hours as needed for severe pain. Must last 30 days.    Dispense:  90 tablet    Refill:  0    Chronic Pain: STOP Act (Not applicable) Fill 1 day early if closed on refill date. Avoid benzodiazepines within 8 hours of opioids     No orders of the defined types were placed in this encounter.   Follow-up plan:   Return in about 8 weeks (around 02/11/2023) for Medication Management, in person.   Recent Visits Date Type Provider Dept  09/24/22 Office Visit Gillis Santa, MD [facility]  Showing recent visits within past 90 days and meeting all other requirements Today's Visits Date Type Provider Dept  12/17/22 Office Visit Gillis Santa, MD [facility]  Showing today's visits and meeting all other requirements Future Appointments Date Type Provider Dept  02/11/23 Appointment Gillis Santa, MD [facility]  Showing future appointments within next 90 days and meeting all other requirements  I discussed the assessment and treatment plan with the patient. The patient was provided an opportunity to ask questions and all were answered. The patient agreed with the plan and demonstrated an understanding of the instructions.  Patient advised to call back or seek an in-person evaluation if the symptoms or condition worsens.  Duration of encounter: 95mnutes.  Note by: BGillis Santa MD Date: 12/17/2022; Time: 3:03 PM

## 2022-12-17 NOTE — Telephone Encounter (Signed)
PA done

## 2022-12-17 NOTE — Progress Notes (Signed)
Nursing Pain Medication Assessment:  Safety precautions to be maintained throughout the outpatient stay will include: orient to surroundings, keep bed in low position, maintain call bell within reach at all times, provide assistance with transfer out of bed and ambulation.  Medication Inspection Compliance:  empty bottle   Medication: Hydrocodone/APAP Pill/Patch Count:  0 of 120 pills remain Pill/Patch Appearance:  empty Bottle Appearance: Standard pharmacy container. Clearly labeled. Filled Date: 26 / 13 / 2023 Last Medication intake:  Day before yesterday

## 2022-12-18 ENCOUNTER — Telehealth: Payer: Self-pay | Admitting: Student in an Organized Health Care Education/Training Program

## 2022-12-18 NOTE — Telephone Encounter (Signed)
PA submitted on 12-17-22.

## 2022-12-18 NOTE — Telephone Encounter (Signed)
Pharmacy told them his pain meds need PA

## 2022-12-23 ENCOUNTER — Encounter: Payer: Self-pay | Admitting: Student in an Organized Health Care Education/Training Program

## 2022-12-23 ENCOUNTER — Telehealth: Payer: Self-pay

## 2022-12-23 ENCOUNTER — Telehealth: Payer: Self-pay | Admitting: Student in an Organized Health Care Education/Training Program

## 2022-12-23 NOTE — Telephone Encounter (Signed)
Per Dr Holley Raring, call patient and notify him that we will no longer be prescribing for him due to him non compliance and overtaking his opioids.  Attempted to call patient and there was no answer. Will retry tomorrow.

## 2022-12-23 NOTE — Telephone Encounter (Signed)
FYI Spoke with patient and informed him that it was [facility] for him to just take the Hydrocodone and not the patch.  Informed him that he had to take it as prescribed.  Patient states he will be out tomorrow which is 6 days into the prescription.  I asked him why he was out and he said he overtook them.. Informed patient that he could not get his medication filled until 01-16-2023. Patient states understanding.

## 2022-12-23 NOTE — Telephone Encounter (Signed)
Patient states e does not want to take the patches. He just wants to stay on his percocet. Please advise patient

## 2022-12-23 NOTE — Telephone Encounter (Signed)
Looks like he is prescribed both. Should I tell him to just not use the patches?

## 2022-12-23 NOTE — Telephone Encounter (Signed)
Attempted to call patient. No answer after several rings. No voicemail.

## 2022-12-24 NOTE — Telephone Encounter (Signed)
Attempted to call patient again. No answer. 

## 2022-12-25 ENCOUNTER — Telehealth: Payer: Self-pay

## 2022-12-25 NOTE — Telephone Encounter (Signed)
ATtempted to call patient to inform him that we will not be prescribing any further opioids for him.  This is the 4th time I have called and he does not answer the phone and there is no answering machine.

## 2022-12-30 ENCOUNTER — Telehealth: Payer: Self-pay

## 2022-12-30 NOTE — Telephone Encounter (Signed)
Last attempt to call patient.  No answer.

## 2023-02-11 ENCOUNTER — Encounter: Payer: Medicare HMO | Admitting: Student in an Organized Health Care Education/Training Program
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
## 2023-05-01 ENCOUNTER — Ambulatory Visit: Payer: Medicare HMO | Admitting: Psychiatry

## 2023-05-21 ENCOUNTER — Other Ambulatory Visit: Payer: Self-pay | Admitting: Student in an Organized Health Care Education/Training Program

## 2023-09-24 ENCOUNTER — Ambulatory Visit: Payer: Medicare HMO | Admitting: Psychiatry

## 2023-12-09 ENCOUNTER — Other Ambulatory Visit: Payer: Self-pay | Admitting: Orthopedic Surgery

## 2023-12-09 DIAGNOSIS — M8448XA Pathological fracture, other site, initial encounter for fracture: Secondary | ICD-10-CM

## 2023-12-10 ENCOUNTER — Other Ambulatory Visit: Payer: Self-pay

## 2023-12-10 ENCOUNTER — Emergency Department: Payer: Medicare HMO

## 2023-12-10 ENCOUNTER — Inpatient Hospital Stay
Admission: EM | Admit: 2023-12-10 | Discharge: 2023-12-23 | DRG: 368 | Disposition: A | Discharge status: Skilled Nursing Facility | Payer: Medicare HMO | Attending: Internal Medicine | Admitting: Internal Medicine

## 2023-12-10 DIAGNOSIS — Z886 Allergy status to analgesic agent status: Secondary | ICD-10-CM

## 2023-12-10 DIAGNOSIS — N179 Acute kidney failure, unspecified: Secondary | ICD-10-CM | POA: Diagnosis not present

## 2023-12-10 DIAGNOSIS — Z8719 Personal history of other diseases of the digestive system: Secondary | ICD-10-CM

## 2023-12-10 DIAGNOSIS — I2510 Atherosclerotic heart disease of native coronary artery without angina pectoris: Secondary | ICD-10-CM

## 2023-12-10 DIAGNOSIS — R1010 Upper abdominal pain, unspecified: Principal | ICD-10-CM

## 2023-12-10 DIAGNOSIS — Z8673 Personal history of transient ischemic attack (TIA), and cerebral infarction without residual deficits: Secondary | ICD-10-CM | POA: Diagnosis not present

## 2023-12-10 DIAGNOSIS — J9601 Acute respiratory failure with hypoxia: Secondary | ICD-10-CM | POA: Diagnosis not present

## 2023-12-10 DIAGNOSIS — I4891 Unspecified atrial fibrillation: Secondary | ICD-10-CM

## 2023-12-10 DIAGNOSIS — Z79899 Other long term (current) drug therapy: Secondary | ICD-10-CM

## 2023-12-10 DIAGNOSIS — J690 Pneumonitis due to inhalation of food and vomit: Secondary | ICD-10-CM | POA: Diagnosis not present

## 2023-12-10 DIAGNOSIS — J449 Chronic obstructive pulmonary disease, unspecified: Secondary | ICD-10-CM | POA: Diagnosis not present

## 2023-12-10 DIAGNOSIS — Z809 Family history of malignant neoplasm, unspecified: Secondary | ICD-10-CM

## 2023-12-10 DIAGNOSIS — K920 Hematemesis: Secondary | ICD-10-CM

## 2023-12-10 DIAGNOSIS — K226 Gastro-esophageal laceration-hemorrhage syndrome: Principal | ICD-10-CM | POA: Diagnosis present

## 2023-12-10 DIAGNOSIS — I69398 Other sequelae of cerebral infarction: Secondary | ICD-10-CM | POA: Diagnosis not present

## 2023-12-10 DIAGNOSIS — I110 Hypertensive heart disease with heart failure: Secondary | ICD-10-CM | POA: Diagnosis present

## 2023-12-10 DIAGNOSIS — Z822 Family history of deafness and hearing loss: Secondary | ICD-10-CM | POA: Diagnosis not present

## 2023-12-10 DIAGNOSIS — Z7901 Long term (current) use of anticoagulants: Secondary | ICD-10-CM

## 2023-12-10 DIAGNOSIS — I480 Paroxysmal atrial fibrillation: Secondary | ICD-10-CM | POA: Diagnosis present

## 2023-12-10 DIAGNOSIS — I639 Cerebral infarction, unspecified: Secondary | ICD-10-CM | POA: Diagnosis present

## 2023-12-10 DIAGNOSIS — J441 Chronic obstructive pulmonary disease with (acute) exacerbation: Secondary | ICD-10-CM | POA: Diagnosis not present

## 2023-12-10 DIAGNOSIS — I714 Abdominal aortic aneurysm, without rupture, unspecified: Secondary | ICD-10-CM | POA: Diagnosis not present

## 2023-12-10 DIAGNOSIS — I5032 Chronic diastolic (congestive) heart failure: Secondary | ICD-10-CM | POA: Diagnosis present

## 2023-12-10 DIAGNOSIS — Z823 Family history of stroke: Secondary | ICD-10-CM

## 2023-12-10 DIAGNOSIS — F17210 Nicotine dependence, cigarettes, uncomplicated: Secondary | ICD-10-CM | POA: Diagnosis present

## 2023-12-10 DIAGNOSIS — M1990 Unspecified osteoarthritis, unspecified site: Secondary | ICD-10-CM | POA: Diagnosis present

## 2023-12-10 DIAGNOSIS — Z8249 Family history of ischemic heart disease and other diseases of the circulatory system: Secondary | ICD-10-CM | POA: Diagnosis not present

## 2023-12-10 DIAGNOSIS — F39 Unspecified mood [affective] disorder: Secondary | ICD-10-CM | POA: Diagnosis present

## 2023-12-10 DIAGNOSIS — K449 Diaphragmatic hernia without obstruction or gangrene: Secondary | ICD-10-CM | POA: Diagnosis present

## 2023-12-10 DIAGNOSIS — Z955 Presence of coronary angioplasty implant and graft: Secondary | ICD-10-CM

## 2023-12-10 DIAGNOSIS — G8929 Other chronic pain: Secondary | ICD-10-CM | POA: Diagnosis present

## 2023-12-10 DIAGNOSIS — L89151 Pressure ulcer of sacral region, stage 1: Secondary | ICD-10-CM | POA: Diagnosis not present

## 2023-12-10 DIAGNOSIS — K922 Gastrointestinal hemorrhage, unspecified: Secondary | ICD-10-CM | POA: Diagnosis not present

## 2023-12-10 DIAGNOSIS — J439 Emphysema, unspecified: Secondary | ICD-10-CM | POA: Diagnosis present

## 2023-12-10 DIAGNOSIS — Z7902 Long term (current) use of antithrombotics/antiplatelets: Secondary | ICD-10-CM | POA: Diagnosis not present

## 2023-12-10 DIAGNOSIS — E871 Hypo-osmolality and hyponatremia: Secondary | ICD-10-CM | POA: Diagnosis not present

## 2023-12-10 DIAGNOSIS — H409 Unspecified glaucoma: Secondary | ICD-10-CM | POA: Diagnosis present

## 2023-12-10 DIAGNOSIS — Z9889 Other specified postprocedural states: Secondary | ICD-10-CM

## 2023-12-10 DIAGNOSIS — Z8679 Personal history of other diseases of the circulatory system: Secondary | ICD-10-CM | POA: Diagnosis not present

## 2023-12-10 DIAGNOSIS — R008 Other abnormalities of heart beat: Secondary | ICD-10-CM | POA: Diagnosis not present

## 2023-12-10 DIAGNOSIS — I10 Essential (primary) hypertension: Secondary | ICD-10-CM | POA: Diagnosis present

## 2023-12-10 DIAGNOSIS — F17200 Nicotine dependence, unspecified, uncomplicated: Secondary | ICD-10-CM | POA: Diagnosis present

## 2023-12-10 LAB — COMPREHENSIVE METABOLIC PANEL
ALT: 18 U/L (ref 0–44)
AST: 21 U/L (ref 15–41)
Albumin: 4.1 g/dL (ref 3.5–5.0)
Alkaline Phosphatase: 90 U/L (ref 38–126)
Anion gap: 13 (ref 5–15)
BUN: 27 mg/dL — ABNORMAL HIGH (ref 8–23)
CO2: 24 mmol/L (ref 22–32)
Calcium: 8.7 mg/dL — ABNORMAL LOW (ref 8.9–10.3)
Chloride: 101 mmol/L (ref 98–111)
Creatinine, Ser: 1.15 mg/dL (ref 0.61–1.24)
GFR, Estimated: >60 mL/min (ref >60)
Glucose, Bld: 120 mg/dL — ABNORMAL HIGH (ref 70–99)
Potassium: 4.2 mmol/L (ref 3.5–5.1)
Sodium: 138 mmol/L (ref 135–145)
Total Bilirubin: 0.9 mg/dL (ref <1.2)
Total Protein: 7.3 g/dL (ref 6.5–8.1)

## 2023-12-10 LAB — PROCALCITONIN: Procalcitonin: 0.14 ng/mL

## 2023-12-10 LAB — TYPE AND SCREEN
ABO/RH(D): A POS
Antibody Screen: NEGATIVE

## 2023-12-10 LAB — CBC
HCT: 37.8 % — ABNORMAL LOW (ref 39.0–52.0)
Hemoglobin: 12.1 g/dL — ABNORMAL LOW (ref 13.0–17.0)
MCH: 30.9 pg (ref 26.0–34.0)
MCHC: 32 g/dL (ref 30.0–36.0)
MCV: 96.4 fL (ref 80.0–100.0)
Platelets: 299 10*3/uL (ref 150–400)
RBC: 3.92 MIL/uL — ABNORMAL LOW (ref 4.22–5.81)
RDW: 18.1 % — ABNORMAL HIGH (ref 11.5–15.5)
WBC: 14 10*3/uL — ABNORMAL HIGH (ref 4.0–10.5)
nRBC: 0 % (ref 0.0–0.2)

## 2023-12-10 LAB — HEMOGLOBIN AND HEMATOCRIT, BLOOD
HCT: 35.2 % — ABNORMAL LOW (ref 39.0–52.0)
Hemoglobin: 11.5 g/dL — ABNORMAL LOW (ref 13.0–17.0)

## 2023-12-10 LAB — PROTIME-INR
INR: 1.1 (ref 0.8–1.2)
Prothrombin Time: 14.8 s (ref 11.4–15.2)

## 2023-12-10 LAB — APTT: aPTT: 32 s (ref 24–36)

## 2023-12-10 LAB — LIPASE, BLOOD: Lipase: 22 U/L (ref 11–51)

## 2023-12-10 MED ORDER — SODIUM CHLORIDE 0.9 % IV SOLN: INTRAVENOUS | Status: AC

## 2023-12-10 MED ORDER — PANTOPRAZOLE SODIUM 40 MG IV SOLR
40.0000 mg | Freq: Once | INTRAVENOUS | Status: AC
  Administered 2023-12-10: 40 mg via INTRAVENOUS
  Filled 2023-12-10: qty 10

## 2023-12-10 MED ORDER — ONDANSETRON HCL 4 MG PO TABS
4.0000 mg | ORAL_TABLET | Freq: Four times a day (QID) | ORAL | Status: DC | PRN
Start: 2023-12-10 — End: 2023-12-23

## 2023-12-10 MED ORDER — ONDANSETRON HCL 4 MG/2ML IJ SOLN: 4.0000 mg | Freq: Four times a day (QID) | INTRAMUSCULAR | Status: DC | PRN

## 2023-12-10 MED ORDER — IOHEXOL 350 MG/ML SOLN
80.0000 mL | Freq: Once | INTRAVENOUS | Status: AC | PRN
  Administered 2023-12-10: 80 mL via INTRAVENOUS

## 2023-12-10 MED ORDER — MORPHINE SULFATE (PF) 4 MG/ML IV SOLN
4.0000 mg | Freq: Once | INTRAVENOUS | Status: AC
  Administered 2023-12-10: 4 mg via INTRAVENOUS
  Filled 2023-12-10: qty 1

## 2023-12-10 MED ORDER — SODIUM CHLORIDE 0.9 % IV SOLN: 1.0000 g | INTRAVENOUS | Status: DC

## 2023-12-10 MED ORDER — NICOTINE 7 MG/24HR TD PT24
7.0000 mg | MEDICATED_PATCH | Freq: Every day | TRANSDERMAL | Status: DC
Start: 2023-12-10 — End: 2023-12-23
  Administered 2023-12-10 – 2023-12-23 (×14): 7 mg via TRANSDERMAL
  Filled 2023-12-10 (×17): qty 1

## 2023-12-10 NOTE — ED Notes (Signed)
Pt ambulated to bedside commode w walker. Pt requires one person assist w walker.
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
## 2023-12-10 NOTE — Assessment & Plan Note (Signed)
Pt reports hx/o multiple strokes in the past w/ chronic LE weakness

## 2023-12-10 NOTE — ED Provider Notes (Signed)
[facility] Provider Note    Event Date/Time   First MD Initiated Contact with Patient 12/10/23 1139     (approximate)   History   Blood In Stools and Hematemesis   HPI  Logan Vazquez is a 77 y.o. male here for evaluation of abdominal pain.  Had a little bit of an upset stomach with upper abdominal pain last couple of days, and then last night and today he vomited about 7 or 8 times with what he describes as bits of blood in it.  Relates some moderate pain in the mid abdomen.  Some nausea and.  Also his stools look slightly dark  He is denies any history of known bleeding before.  There is no chest pain.  No trouble breathing  He is concerned about vomiting up blood     Physical Exam   Triage Vital Signs: ED Triage Vitals  Encounter Vitals Group     BP 12/10/23 0946 (!) 114/57     Systolic BP Percentile --      Diastolic BP Percentile --      Pulse Rate 12/10/23 0946 (!) 56     Resp 12/10/23 0946 18     Temp 12/10/23 0946 98.3 F (36.8 C)     Temp Source 12/10/23 0946 Oral     SpO2 12/10/23 0946 97 %     Weight --      Height --      Head Circumference --      Peak Flow --      Pain Score 12/10/23 0945 8     Pain Loc --      Pain Education --      Exclude from Growth Chart --     Most recent vital signs: Vitals:   12/10/23 1152 12/10/23 1152  BP: 125/72   Pulse: (!) 58   Resp: (!) 21   Temp:    SpO2: 94% 95%     General: Awake, no distress.  Peers somewhat uncomfortable reporting pain in the epigastric area and left upper quadrant CV:  Good peripheral perfusion.  Normal tones and rate Resp:  Normal effort.  Clear bilateral Abd:  No distention.  Moderate discomfort in the epigastrium and left upper quadrant focally to touch.  No rebound or guarding no peritonitis.  No discomfort to the right upper quadrant lower quadrants bilateral Other:     Use of Plavix noted, not on any anticoagulant  ED Results / Procedures / Treatments    Labs (all labs ordered are listed, but only abnormal results are displayed) Labs Reviewed  COMPREHENSIVE METABOLIC PANEL - Abnormal; Notable for the following components:      Result Value   Glucose, Bld 120 (*)    BUN 27 (*)    Calcium 8.7 (*)    All other components within normal limits  CBC - Abnormal; Notable for the following components:   WBC 14.0 (*)    RBC 3.92 (*)    Hemoglobin 12.1 (*)    HCT 37.8 (*)    RDW 18.1 (*)    All other components within normal limits  LIPASE, BLOOD  PROTIME-INR  APTT  PROCALCITONIN  HEMOGLOBIN AND HEMATOCRIT, BLOOD  HEMOGLOBIN AND HEMATOCRIT, BLOOD  HEMOGLOBIN AND HEMATOCRIT, BLOOD  POC OCCULT BLOOD, ED  TYPE AND SCREEN     EKG  Interpreted by me at 1310 heart rate 60 QRS 90 QTc 450 Normal sinus rhythm LVH no evidence of acute ischemia  RADIOLOGY  CT ANGIO GI BLEED Result Date: 12/10/2023 CLINICAL DATA:  Lower GI bleeding, hematemesis * Tracking Code: BO * EXAM: CTA ABDOMEN AND PELVIS WITHOUT AND WITH CONTRAST TECHNIQUE: Multidetector CT imaging of the abdomen and pelvis was performed using the standard protocol during bolus administration of intravenous contrast. Multiplanar reconstructed images and MIPs were obtained and reviewed to evaluate the vascular anatomy. RADIATION DOSE REDUCTION: This exam was performed according to the departmental dose-optimization program which includes automated exposure control, adjustment of the mA and/or kV according to patient size and/or use of iterative reconstruction technique. CONTRAST:  80mL OMNIPAQUE IOHEXOL 350 MG/ML SOLN COMPARISON:  06/08/2022 FINDINGS: VASCULAR Redemonstrated aortobiiliac stent endograft repair of an infrarenal abdominal aortic aneurysm excluded aneurysm sac slightly diminished in size, measuring 5.6 x 5.6 cm in caliber, previously 6.0 x 5.9 cm (series 9, image 41). No evidence of endoleak. Review of the MIP images confirms the above findings. NON-VASCULAR Lower Chest: No  acute findings. Bibasilar fibrosis. Elevation of the left hemidiaphragm. Coronary artery calcifications. Small hiatal hernia. Hepatobiliary: No focal liver abnormality is seen. Status post cholecystectomy. No biliary dilatation. Pancreas: Unremarkable. No pancreatic ductal dilatation or surrounding inflammatory changes. Spleen: Normal in size without significant abnormality. Adrenals/Urinary Tract: Adrenal glands are unremarkable. Simple, benign left renal cortical cysts, for which no further follow-up or characterization is required. Kidneys are otherwise normal, without renal calculi, solid lesion, or hydronephrosis. Bladder is unremarkable. Stomach/Bowel: Stomach is within normal limits. Appendix appears normal. Masslike narrowing and circumferential wall thickening of the mid ascending colon, a segment approximately 3 cm in length (series 9, image 33). Lymphatic: No enlarged abdominal or pelvic lymph nodes. Reproductive: Prostatomegaly. Other: No abdominal wall hernia or abnormality. No ascites. Musculoskeletal: No acute osseous findings. IMPRESSION: 1. No intraluminal contrast extravasation or other findings to specifically localize GI bleeding. 2. Masslike narrowing and circumferential wall thickening of the mid ascending colon, a segment approximately 3 cm in length. Appearance is concerning for colon malignancy, however could reflect peristalsis. Consider repeat CT including oral enteric contrast or colonoscopy to further evaluate in the setting of otherwise unexplained GI bleeding. 3. Redemonstrated aortobiiliac stent endograft repair of an infrarenal abdominal aortic aneurysm, excluded aneurysm sac slightly diminished in size, measuring 5.6 x 5.6 cm in caliber, previously 6.0 x 5.9 cm. No evidence of endoleak. 4. Prostatomegaly. 5. Coronary artery disease. Aortic Atherosclerosis (ICD10-I70.0). Electronically Signed   By: Jearld Lesch M.D.   On: 12/10/2023 12:47   DG Chest Portable 1 View Result Date:  12/10/2023 CLINICAL DATA:  Vomiting.  Upper abdominal pain. EXAM: PORTABLE CHEST 1 VIEW COMPARISON:  06/10/2022. FINDINGS: Redemonstration of left retrocardiac airspace opacity obscuring the left hemidiaphragm, descending thoracic aorta and blunting the left lateral costophrenic angle suggesting combination of left lung atelectasis and/or consolidation with pleural effusion. No significant interval change since the prior study. There is mild pulmonary vascular congestion. Bilateral lung fields are otherwise clear. No dense consolidation or lung collapse. Right lateral costophrenic angle is clear. Stable cardio-mediastinal silhouette. No acute osseous abnormalities. The soft tissues are within normal limits. No free air under the domes of diaphragm. IMPRESSION: *Mild pulmonary vascular congestion. Left retrocardiac opacity, as described above. Electronically Signed   By: Jules Schick M.D.   On: 12/10/2023 12:15   Normal send procalcitonin, patient denies any acute pulmonary symptoms or fever, I feel this is unlikely to represent infection/pneumonia.   PROCEDURES:  Critical Care performed: No  Procedures   MEDICATIONS ORDERED IN ED: Medications  pantoprazole (PROTONIX) injection  40 mg (40 mg Intravenous Given 12/10/23 1207)  iohexol (OMNIPAQUE) 350 MG/ML injection 80 mL (80 mLs Intravenous Contrast Given 12/10/23 1218)  morphine (PF) 4 MG/ML injection 4 mg (4 mg Intravenous Given 12/10/23 1317)     IMPRESSION / MDM / ASSESSMENT AND PLAN / ED COURSE  I reviewed the triage vital signs and the nursing notes.                              Differential diagnosis includes, but is not limited to, upper GI bleed, Mallory-Weiss tear, no clinical findings of severe chest pain or symptoms of be highly suggestive of Boerhaave's, gastrointestinal bleeding, perforation etc.  The patient's workup is reassuring but his clinical symptoms of left upper quadrant pain small amounts of hematemesis and dark  stools suggestive of upper GI bleeding.  Discussed case with our hospitalist Dr. Alvester Morin who will see and agreeable to plan for admission.  Also notified GI, Dr. Daleen Squibb of case and he is agreeable to consult  Patient's presentation is most consistent with acute complicated illness / injury requiring diagnostic workup.   The patient is on the cardiac monitor to evaluate for evidence of arrhythmia and/or significant heart rate changes.**}  Labs demonstrate leukocytosis white count 14,000.  Obvious infectious symptomatology accompanying.  Normal INR.  Not anticoagulated       FINAL CLINICAL IMPRESSION(S) / ED DIAGNOSES   Final diagnoses:  Upper abdominal pain  Hematemesis with nausea     Rx / DC Orders   ED Discharge Orders     None        Note:  This document was prepared using Dragon voice recognition software and may include unintentional dictation errors.   Sharyn Creamer, MD 12/10/23 1339

## 2023-12-10 NOTE — Assessment & Plan Note (Signed)
BP stable Titrate home regimen 

## 2023-12-10 NOTE — Assessment & Plan Note (Signed)
Baseline atrial fibrillation  Noted last cardiology note 05/2022- on eliquis  EKG NSR today  Hold AC  Rate controlled  Follow

## 2023-12-10 NOTE — Assessment & Plan Note (Signed)
Baseline nonobstructive CAD  Cont statin

## 2023-12-10 NOTE — Assessment & Plan Note (Signed)
Hematemesis over multiple days with black stools  CT Angio GI bleed stable  Hgb stable around 12  IV PPI  Dr. Servando Snare consulted  Trend hgb  Transfuse for hgb <7  Follow

## 2023-12-10 NOTE — ED Triage Notes (Signed)
Pt to ED [facility] from [facility] for upper abdominal pain with dark red color in emesis (6-7 times) and dark red blood in stool since yesterday. Also some mild SOB with exertion. Ambulatory on scene, uses walker.  EMS VS: 138/88, 98.8, 74 HR, 96% RA

## 2023-12-10 NOTE — Assessment & Plan Note (Signed)
Stable from a resp standpoint  Cont home inhalers   

## 2023-12-10 NOTE — Assessment & Plan Note (Signed)
1/4 PPD smoker  Discussed cessation  Nicotine patch

## 2023-12-10 NOTE — ED Notes (Signed)
Pt back from CT

## 2023-12-10 NOTE — ED Notes (Signed)
This nurse called [facility] caretaker [phone]) for a update with pt permission.

## 2023-12-10 NOTE — Assessment & Plan Note (Signed)
S/p repair w/ no evidence of endoleak on CT today

## 2023-12-10 NOTE — ED Notes (Signed)
[facility] [phone] (Facility)

## 2023-12-11 ENCOUNTER — Encounter: Payer: Self-pay | Admitting: Family Medicine

## 2023-12-11 ENCOUNTER — Encounter
Admission: EM | Disposition: A | Discharge status: Skilled Nursing Facility | Payer: Self-pay | Source: Home / Self Care | Attending: Internal Medicine

## 2023-12-11 ENCOUNTER — Inpatient Hospital Stay: Payer: Medicare HMO | Admitting: Anesthesiology

## 2023-12-11 ENCOUNTER — Encounter
Admission: RE | Admit: 2023-12-11 | Discharge: 2023-12-11 | Disposition: A | Discharge status: Home / Self Care | Payer: Medicare HMO | Source: Ambulatory Visit | Attending: Orthopedic Surgery | Admitting: Orthopedic Surgery

## 2023-12-11 DIAGNOSIS — K922 Gastrointestinal hemorrhage, unspecified: Secondary | ICD-10-CM | POA: Diagnosis not present

## 2023-12-11 HISTORY — PX: ESOPHAGOGASTRODUODENOSCOPY (EGD) WITH PROPOFOL: SHX5813

## 2023-12-11 LAB — COMPREHENSIVE METABOLIC PANEL
ALT: 48 U/L — ABNORMAL HIGH (ref 0–44)
AST: 47 U/L — ABNORMAL HIGH (ref 15–41)
Albumin: 3.5 g/dL (ref 3.5–5.0)
Alkaline Phosphatase: 96 U/L (ref 38–126)
Anion gap: 8 (ref 5–15)
BUN: 27 mg/dL — ABNORMAL HIGH (ref 8–23)
CO2: 24 mmol/L (ref 22–32)
Calcium: 8.2 mg/dL — ABNORMAL LOW (ref 8.9–10.3)
Chloride: 106 mmol/L (ref 98–111)
Creatinine, Ser: 1.09 mg/dL (ref 0.61–1.24)
GFR, Estimated: >60 mL/min (ref >60)
Glucose, Bld: 136 mg/dL — ABNORMAL HIGH (ref 70–99)
Potassium: 3.6 mmol/L (ref 3.5–5.1)
Sodium: 138 mmol/L (ref 135–145)
Total Bilirubin: 1.3 mg/dL — ABNORMAL HIGH (ref <1.2)
Total Protein: 6.6 g/dL (ref 6.5–8.1)

## 2023-12-11 LAB — HEMOGLOBIN AND HEMATOCRIT, BLOOD
HCT: 33.9 % — ABNORMAL LOW (ref 39.0–52.0)
HCT: 33.9 % — ABNORMAL LOW (ref 39.0–52.0)
Hemoglobin: 11.2 g/dL — ABNORMAL LOW (ref 13.0–17.0)
Hemoglobin: 10.9 g/dL — ABNORMAL LOW (ref 13.0–17.0)

## 2023-12-11 SURGERY — ESOPHAGOGASTRODUODENOSCOPY (EGD) WITH PROPOFOL
Anesthesia: General

## 2023-12-11 MED ORDER — PROPOFOL 10 MG/ML IV BOLUS
INTRAVENOUS | Status: DC | PRN
  Administered 2023-12-11: 120 ug/kg/min via INTRAVENOUS
  Administered 2023-12-11: 80 mg via INTRAVENOUS

## 2023-12-11 MED ORDER — PREGABALIN 75 MG PO CAPS
75.0000 mg | ORAL_CAPSULE | Freq: Three times a day (TID) | ORAL | Status: DC
  Administered 2023-12-11 – 2023-12-23 (×36): 75 mg via ORAL
  Filled 2023-12-11 (×36): qty 1

## 2023-12-11 MED ORDER — MORPHINE SULFATE (PF) 2 MG/ML IV SOLN
2.0000 mg | INTRAVENOUS | Status: DC | PRN
  Administered 2023-12-11 – 2023-12-23 (×23): 2 mg via INTRAVENOUS
  Filled 2023-12-11 (×24): qty 1

## 2023-12-11 MED ORDER — TRAMADOL HCL 50 MG PO TABS
50.0000 mg | ORAL_TABLET | Freq: Once | ORAL | Status: AC | PRN
  Administered 2023-12-11: 50 mg via ORAL
  Filled 2023-12-11: qty 1

## 2023-12-11 MED ORDER — SODIUM CHLORIDE 0.9 % IV SOLN: INTRAVENOUS | Status: DC

## 2023-12-11 MED ORDER — HYDRALAZINE HCL 20 MG/ML IJ SOLN: 10.0000 mg | Freq: Four times a day (QID) | INTRAMUSCULAR | Status: DC | PRN

## 2023-12-11 MED ORDER — SEVOFLURANE IN SOLN
RESPIRATORY_TRACT | Status: AC
  Filled 2023-12-11: qty 250

## 2023-12-11 MED ORDER — LIDOCAINE HCL (PF) 2 % IJ SOLN
INTRAMUSCULAR | Status: AC
  Filled 2023-12-11: qty 5

## 2023-12-11 MED ORDER — ALBUTEROL SULFATE (2.5 MG/3ML) 0.083% IN NEBU
2.5000 mg | INHALATION_SOLUTION | RESPIRATORY_TRACT | Status: DC | PRN
  Administered 2023-12-11 – 2023-12-15 (×4): 2.5 mg via RESPIRATORY_TRACT
  Filled 2023-12-11 (×4): qty 3

## 2023-12-11 MED ORDER — BUSPIRONE HCL 10 MG PO TABS
15.0000 mg | ORAL_TABLET | Freq: Three times a day (TID) | ORAL | Status: DC
  Administered 2023-12-11 – 2023-12-23 (×35): 15 mg via ORAL
  Filled 2023-12-11 (×13): qty 2
  Filled 2023-12-11: qty 1.5
  Filled 2023-12-11 (×4): qty 2
  Filled 2023-12-11: qty 1.5
  Filled 2023-12-11 (×9): qty 2
  Filled 2023-12-11 (×2): qty 1.5
  Filled 2023-12-11 (×6): qty 2

## 2023-12-11 MED ORDER — ATORVASTATIN CALCIUM 20 MG PO TABS
40.0000 mg | ORAL_TABLET | Freq: Every day | ORAL | Status: DC
  Administered 2023-12-11 – 2023-12-21 (×11): 40 mg via ORAL
  Filled 2023-12-11 (×11): qty 2

## 2023-12-11 MED ORDER — LIDOCAINE HCL (CARDIAC) PF 100 MG/5ML IV SOSY
PREFILLED_SYRINGE | INTRAVENOUS | Status: DC | PRN
  Administered 2023-12-11: 50 mg via INTRAVENOUS

## 2023-12-11 MED ORDER — FLUOXETINE HCL 20 MG PO CAPS
60.0000 mg | ORAL_CAPSULE | Freq: Every morning | ORAL | Status: DC
  Administered 2023-12-12 – 2023-12-23 (×12): 60 mg via ORAL
  Filled 2023-12-11 (×12): qty 3

## 2023-12-11 MED ORDER — INFLUENZA VAC A&B SURF ANT ADJ 0.5 ML IM SUSY
0.5000 mL | PREFILLED_SYRINGE | INTRAMUSCULAR | Status: DC
  Filled 2023-12-11: qty 0.5

## 2023-12-11 MED ORDER — PROPOFOL 1000 MG/100ML IV EMUL
INTRAVENOUS | Status: AC
  Filled 2023-12-11: qty 100

## 2023-12-11 MED ORDER — TRAZODONE HCL 50 MG PO TABS
50.0000 mg | ORAL_TABLET | Freq: Every day | ORAL | Status: DC
  Administered 2023-12-11 – 2023-12-22 (×12): 50 mg via ORAL
  Filled 2023-12-11 (×12): qty 1

## 2023-12-11 MED ORDER — LABETALOL HCL 5 MG/ML IV SOLN
10.0000 mg | INTRAVENOUS | Status: DC | PRN
  Administered 2023-12-11: 10 mg via INTRAVENOUS
  Filled 2023-12-11: qty 4

## 2023-12-11 NOTE — Anesthesia Postprocedure Evaluation (Signed)
Anesthesia Post Note  Patient: Logan Vazquez  Procedure(s) Performed: ESOPHAGOGASTRODUODENOSCOPY (EGD) WITH PROPOFOL  Patient location during evaluation: Endoscopy Anesthesia Type: General Level of consciousness: awake and alert Pain management: pain level controlled Vital Signs Assessment: post-procedure vital signs reviewed and stable Respiratory status: spontaneous breathing, nonlabored ventilation, respiratory function stable and patient connected to nasal cannula oxygen Cardiovascular status: blood pressure returned to baseline and stable Postop Assessment: no apparent nausea or vomiting Anesthetic complications: no   No notable events documented.   Last Vitals:  Vitals:   12/11/23 1216 12/11/23 1226  BP: (!) 150/76 (!) 142/75  Pulse: 79 78  Resp: (!) 22 (!) 21  Temp:    SpO2: 99% 99%    Last Pain:  Vitals:   12/11/23 1226  TempSrc:   PainSc: 0-No pain                 Cleda Mccreedy Jonah Gingras

## 2023-12-11 NOTE — Progress Notes (Signed)
PROGRESS NOTE    Logan Vazquez  [name]:[id number] DOB: February 13, 1946 DOA: 12/10/2023 PCP: Oswaldo Conroy, MD  Chief Complaint  Patient presents with   Blood In Stools   Hematemesis    Hospital Course:  Logan Vazquez is 77 y.o. male with CAD, AAA s/p repair, chronic iron deficiency anemia, asthma, COPD, hypertension, chronic back pain, who presents with an upper GI bleed.  Patient endorsed recurrent epigastric pain over the last 24 hours with new episodes of hematemesis.  Denies history of alcohol use.  Smokes about 4 cigarettes daily.  Does have history of atrial fibrillation, on anticoagulation. Denies any NSAID use  In ED hemoglobin 12.1.  CT angio GI bleed negative for acute bleeding. Stable aortobiiliac stent endograft repair. Noted masslike narrowing Ventral wall thickening of the mid ascending colon. GI consulted.  Patient underwent EGD on 12/26 which did not reveal any acute or old bleeding.  GI reports likely Mallory-Weiss tear which may have already healed without sign of tissue disruption.  subjective: Evaluated after EGD.  Blood pressure currently very elevated and patient is bradycardic.  He reports he feels better.  No vomiting, no diarrhea.  Has not yet tried to eat.   Objective: Vitals:   12/11/23 1216 12/11/23 1226 12/11/23 1415 12/11/23 1513  BP: (!) 150/76 (!) 142/75 (!) 140/70   Pulse: 79 78 74   Resp: (!) 22 (!) 21 19   Temp:    98.2 F (36.8 C)  TempSrc:    Oral  SpO2: 99% 99% 100%   Weight:      Height:         Intake/Output Summary (Last 24 hours) at 12/11/2023 1704 Last data filed at 12/11/2023 1504 Gross per 24 hour  Intake 200 ml  Output 200 ml  Net 0 ml   Filed Weights   12/11/23 1124  Weight: 54.9 kg    Examination: General exam: Appears calm and comfortable, NAD, thin Respiratory system: No work of breathing, symmetric chest wall expansion, occasional cough, some upper airway rales Cardiovascular system: S1 & S2 heard, RRR.   Gastrointestinal system: Abdomen is nondistended, soft and nontender.  Neuro: Alert and oriented. No focal neurological deficits. Extremities: Symmetric, expected ROM Skin: No rashes, lesions Psychiatry: Demonstrates appropriate judgement and insight. Mood & affect appropriate for situation.   Assessment & Plan:  Principal Problem:   Upper GI bleed Active Problems:   Atrial fibrillation (HCC)   Essential hypertension   Abdominal aortic aneurysm (AAA) (HCC)   COPD (chronic obstructive pulmonary disease) (HCC)   Tobacco dependence   Ischemic stroke (HCC)   Coronary artery disease involving native coronary artery of native heart without angina pectoris    Upper GI bleed - CT angio negative for acute bleed but did reveal narrowing in the colon that could represent mass versus underdistention. - GI consulted - EGD 12/26: without acute bleed or evidence of old blood.  Suspected healed Mallory-Weiss tear. - On PPI now.  Advance diet. - Continue trending CBC  Atrial fibrillation - On Eliquis - EKG NSR on arrival - Hold anticoagulation for now in setting of GI bleed - Currently rate controlled, resume home meds when reconciled - Continue current medications  Hypertension --Blood pressure very labile.   -- As needed labetalol and hydralazine  AAA Status post repair with no evidence of endoleak on CT  COPD - Not currently in exacerbation.  On room air - Continue home meds  CAD involving native coronary artery of native heart without  angina - Baseline nonobstructive CAD - Continue home meds  History of ischemic stroke - History of multiple prior strokes in the past with chronic lower extremity weakness - Continue home meds  Tobacco dependence - Endorses quarter pack per day smoker - Cessation counseling provided - Nicotine patch for now   DVT prophylaxis: SCDs   Code Status: Full Code Family Communication: none at bedside, discussed directly with pt Disposition:   Status is: Inpatient, pending blood pressure control , PO challenge and repeat CBC in a.m.  Consultants:  GI  Procedures:  EGD 12/26  Antimicrobials:  Anti-infectives (From admission, onward)    Start     Dose/Rate Route Frequency Ordered Stop   12/10/23 1445  cefTRIAXone (ROCEPHIN) 1 g in sodium chloride 0.9 % 100 mL IVPB  Status:  Discontinued        1 g 200 mL/hr over 30 Minutes Intravenous STAT 12/10/23 1430 12/10/23 1430       Data Reviewed: I have personally reviewed following labs and imaging studies CBC: Recent Labs  Lab 12/10/23 0950 12/10/23 1341 12/11/23 0627  WBC 14.0*  --   --   HGB 12.1* 11.5* 11.2*  HCT 37.8* 35.2* 33.9*  MCV 96.4  --   --   PLT 299  --   --    Basic Metabolic Panel: Recent Labs  Lab 12/10/23 0950 12/11/23 0627  NA 138 138  K 4.2 3.6  CL 101 106  CO2 24 24  GLUCOSE 120* 136*  BUN 27* 27*  CREATININE 1.15 1.09  CALCIUM 8.7* 8.2*   GFR: Estimated Creatinine Clearance: 43.4 mL/min (by C-G formula based on SCr of 1.09 mg/dL). Liver Function Tests: Recent Labs  Lab 12/10/23 0950 12/11/23 0627  AST 21 47*  ALT 18 48*  ALKPHOS 90 96  BILITOT 0.9 1.3*  PROT 7.3 6.6  ALBUMIN 4.1 3.5   CBG: No results for input(s): "GLUCAP" in the last 168 hours.  No results found for this or any previous visit (from the past 240 hours).   Radiology Studies: CT ANGIO GI BLEED Result Date: 12/10/2023 CLINICAL DATA:  Lower GI bleeding, hematemesis * Tracking Code: BO * EXAM: CTA ABDOMEN AND PELVIS WITHOUT AND WITH CONTRAST TECHNIQUE: Multidetector CT imaging of the abdomen and pelvis was performed using the standard protocol during bolus administration of intravenous contrast. Multiplanar reconstructed images and MIPs were obtained and reviewed to evaluate the vascular anatomy. RADIATION DOSE REDUCTION: This exam was performed according to the departmental dose-optimization program which includes automated exposure control, adjustment of the mA  and/or kV according to patient size and/or use of iterative reconstruction technique. CONTRAST:  80mL OMNIPAQUE IOHEXOL 350 MG/ML SOLN COMPARISON:  06/08/2022 FINDINGS: VASCULAR Redemonstrated aortobiiliac stent endograft repair of an infrarenal abdominal aortic aneurysm excluded aneurysm sac slightly diminished in size, measuring 5.6 x 5.6 cm in caliber, previously 6.0 x 5.9 cm (series 9, image 41). No evidence of endoleak. Review of the MIP images confirms the above findings. NON-VASCULAR Lower Chest: No acute findings. Bibasilar fibrosis. Elevation of the left hemidiaphragm. Coronary artery calcifications. Small hiatal hernia. Hepatobiliary: No focal liver abnormality is seen. Status post cholecystectomy. No biliary dilatation. Pancreas: Unremarkable. No pancreatic ductal dilatation or surrounding inflammatory changes. Spleen: Normal in size without significant abnormality. Adrenals/Urinary Tract: Adrenal glands are unremarkable. Simple, benign left renal cortical cysts, for which no further follow-up or characterization is required. Kidneys are otherwise normal, without renal calculi, solid lesion, or hydronephrosis. Bladder is unremarkable. Stomach/Bowel: Stomach is within normal  limits. Appendix appears normal. Masslike narrowing and circumferential wall thickening of the mid ascending colon, a segment approximately 3 cm in length (series 9, image 33). Lymphatic: No enlarged abdominal or pelvic lymph nodes. Reproductive: Prostatomegaly. Other: No abdominal wall hernia or abnormality. No ascites. Musculoskeletal: No acute osseous findings. IMPRESSION: 1. No intraluminal contrast extravasation or other findings to specifically localize GI bleeding. 2. Masslike narrowing and circumferential wall thickening of the mid ascending colon, a segment approximately 3 cm in length. Appearance is concerning for colon malignancy, however could reflect peristalsis. Consider repeat CT including oral enteric contrast or  colonoscopy to further evaluate in the setting of otherwise unexplained GI bleeding. 3. Redemonstrated aortobiiliac stent endograft repair of an infrarenal abdominal aortic aneurysm, excluded aneurysm sac slightly diminished in size, measuring 5.6 x 5.6 cm in caliber, previously 6.0 x 5.9 cm. No evidence of endoleak. 4. Prostatomegaly. 5. Coronary artery disease. Aortic Atherosclerosis (ICD10-I70.0). Electronically Signed   By: Jearld Lesch M.D.   On: 12/10/2023 12:47   DG Chest Portable 1 View Result Date: 12/10/2023 CLINICAL DATA:  Vomiting.  Upper abdominal pain. EXAM: PORTABLE CHEST 1 VIEW COMPARISON:  06/10/2022. FINDINGS: Redemonstration of left retrocardiac airspace opacity obscuring the left hemidiaphragm, descending thoracic aorta and blunting the left lateral costophrenic angle suggesting combination of left lung atelectasis and/or consolidation with pleural effusion. No significant interval change since the prior study. There is mild pulmonary vascular congestion. Bilateral lung fields are otherwise clear. No dense consolidation or lung collapse. Right lateral costophrenic angle is clear. Stable cardio-mediastinal silhouette. No acute osseous abnormalities. The soft tissues are within normal limits. No free air under the domes of diaphragm. IMPRESSION: *Mild pulmonary vascular congestion. Left retrocardiac opacity, as described above. Electronically Signed   By: Jules Schick M.D.   On: 12/10/2023 12:15    Scheduled Meds:  nicotine  7 mg Transdermal Daily   Continuous Infusions:  sodium chloride Stopped (12/11/23 1504)     LOS: 1 day    Time spent:   Debarah Crape, DO [facility]  To contact the attending physician between 7A-7P please use Epic Chat. To contact the covering physician during after hours 7P-7A, please review Amion.   12/11/2023, 5:04 PM   *This document has been created with the assistance of dictation software. Please excuse typographical errors.  *

## 2023-12-11 NOTE — Evaluation (Signed)
Physical Therapy Evaluation Patient Details Name: Logan Vazquez MRN: [id number] DOB: 1946/09/27 Today's Date: 12/11/2023  History of Present Illness  77 y.o. male with medical history significant of CAD, AAA status postrepair, chronic iron deficiency anemia, asthma, COPD, hypertension, chronic back pain presenting with upper GI bleed.  Patient reports recurrent epigastric pain over the past 24 hours.  Has had multiple episodes of bloody vomiting. Cough and shortness of breath.  Clinical Impression  Pt pleasant and willing to work with PT, repeatedly speaks of friends coming to get him and take him to his facility, he was unable to state type/name of facility.  He showed good overall mobility and confidence with getting to sitting and standing.  He displayed slow and stooped gait, but reports as feeling near his baseline, pt on room air during ~75 ft of ambulation with walker and SpO2 varied 86-96%, >90 except for coughing fits.  Pt will benefit from continued PT to address functional limitations and to facilitate safe d/c.        If plan is discharge home, recommend the following: A little help with walking and/or transfers;A little help with bathing/dressing/bathroom;Assistance with feeding;Assist for transportation   Can travel by private vehicle        Equipment Recommendations  (reports he has a FWW)  Recommendations for Other Services       Functional Status Assessment Patient has had a recent decline in their functional status and demonstrates the ability to make significant improvements in function in a reasonable and predictable amount of time.     Precautions / Restrictions Precautions Precautions: Fall Restrictions Weight Bearing Restrictions Per Provider Order: No      Mobility  Bed Mobility Overal bed mobility: Needs Assistance Bed Mobility: Supine to Sit, Sit to Supine     Supine to sit: Contact guard Sit to supine: Contact guard assist   General bed mobility  comments: heavy use of rails, good effort, no direct assist needed    Transfers Overall transfer level: Modified independent Equipment used: Rolling walker (2 wheels)               General transfer comment: cuing for walker use w/o need for direct assist    Ambulation/Gait Ambulation/Gait assistance: Contact guard assist Gait Distance (Feet): 75 Feet Assistive device: Rolling walker (2 wheels)         General Gait Details: forward flexed posture (reports this as baseline) with slow but consistent cadence.  On room air t/o the effort with SpO2 staying in the 86-96% range, generally 90-93%.  Drops in SpO2 appear loosely associated with coughing fits and talking.  No LOBs, reports feeling close to his baseline.  Stairs            Wheelchair Mobility     Tilt Bed    Modified Rankin (Stroke Patients Only)       Balance Overall balance assessment: Needs assistance Sitting-balance support: No upper extremity supported Sitting balance-Leahy Scale: Good       Standing balance-Leahy Scale: Fair Standing balance comment: reliant on the walker, no LOBs                             Pertinent Vitals/Pain Pain Assessment Pain Assessment: No/denies pain    Home Living Family/patient expects to be discharged to:: Other (Comment)                   Additional Comments: notes from last  year indicate he lived at home alone, he can't differentiate if he is currently at group home, SNF or ALF but states "they provide 3 meals a day at the place"    Prior Function Prior Level of Function : Patient poor historian/Family not available             Mobility Comments: reports he walks to/from dining area ADLs Comments: reports he dresses, etc himself, does sponge baths     Extremity/Trunk Assessment   Upper Extremity Assessment Upper Extremity Assessment: Generalized weakness    Lower Extremity Assessment Lower Extremity Assessment: Generalized  weakness    Cervical / Trunk Assessment Cervical / Trunk Assessment: Kyphotic  Communication   Communication Communication: No apparent difficulties Cueing Techniques: Verbal cues  Cognition Arousal: Alert Behavior During Therapy: WFL for tasks assessed/performed Overall Cognitive Status: Difficult to assess                                 General Comments: pt states "My mind forgets sometimes" when asked PLOF, etc questions        General Comments General comments (skin integrity, edema, etc.): Pt's SpO2 98% on 2L on arrival, session on room air with SpO2 generally staying >90 with drops during coughing and talking bouts    Exercises     Assessment/Plan    PT Assessment Patient needs continued PT services  PT Problem List Decreased strength;Decreased range of motion;Decreased activity tolerance;Decreased balance;Decreased mobility;Pain;Decreased cognition;Decreased safety awareness;Cardiopulmonary status limiting activity       PT Treatment Interventions DME instruction;Gait training;Stair training;Functional mobility training;Therapeutic activities;Therapeutic exercise;Balance training;Patient/family education    PT Goals (Current goals can be found in the Care Plan section)  Acute Rehab PT Goals Patient Stated Goal: go home ASAP (speaks of leaving today AMA) PT Goal Formulation: With patient Time For Goal Achievement: 12/24/23 Potential to Achieve Goals: Good    Frequency Min 1X/week     Co-evaluation               AM-PAC PT "6 Clicks" Mobility  Outcome Measure Help needed turning from your back to your side while in a flat bed without using bedrails?: None Help needed moving from lying on your back to sitting on the side of a flat bed without using bedrails?: None Help needed moving to and from a bed to a chair (including a wheelchair)?: None Help needed standing up from a chair using your arms (e.g., wheelchair or bedside chair)?: A  Little Help needed to walk in hospital room?: A Little Help needed climbing 3-5 steps with a railing? : A Little 6 Click Score: 21    End of Session Equipment Utilized During Treatment: Gait belt Activity Tolerance: Patient limited by fatigue;Patient tolerated treatment well Patient left: in bed;with call bell/phone within reach;with nursing/sitter in room Nurse Communication: Mobility status PT Visit Diagnosis: Muscle weakness (generalized) (M62.81);Difficulty in walking, not elsewhere classified (R26.2)    Time: 1703-1730 PT Time Calculation (min) (ACUTE ONLY): 27 min   Charges:   PT Evaluation $PT Eval Low Complexity: 1 Low PT Treatments $Gait Training: 23-37 mins PT General Charges $$ ACUTE PT VISIT: 1 Visit         Malachi Pro, DPT 12/11/2023, 6:28 PM
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
## 2023-12-11 NOTE — Progress Notes (Signed)
PT Cancellation Note  Patient Details Name: OMARI KINNARD MRN: [id number] DOB: Oct 05, 1946   Cancelled Treatment:    Reason Eval/Treat Not Completed: Patient at procedure or test/unavailable Chart reviewed, pt out of room for endo procedure (ESOPHAGOGASTRODUODENOSCOPY), will maintain on caseload and to attempt to treat as able/appropriate.     Malachi Pro, DPT 12/11/2023, 12:14 PM

## 2023-12-11 NOTE — Progress Notes (Signed)
OT Cancellation Note  Patient Details Name: Logan Vazquez MRN: [id number] DOB: 04-15-1946   Cancelled Treatment:    Reason Eval/Treat Not Completed: Patient at procedure or test/ unavailable. Pt currently undergoing endoscopy. OT will check back as able and when medically appropriate.   [place]remont Skalicky L. Lourene Hoston, OTR/L  12/11/23, 1:00 PM

## 2023-12-11 NOTE — Progress Notes (Signed)
The patient had an upper endoscopy today without any sign of any GI bleeding.  There was no old blood or new blood.  The patient had bleeding after he had vomited a few times consistent with this being a Mallory-Weiss tear which typically heals within 20 to 48 hours without any sign of tissue disruption as this case was shown.  Nothing further to do from a GI point of view.  I will sign off.  Please call if any further GI concerns or questions.  We would like to thank you for the opportunity to participate in the care of Logan Vazquez.

## 2023-12-11 NOTE — Op Note (Signed)
[facility] Patient Name: Logan Vazquez Procedure Date: 12/11/2023 11:20 AM MRN: [id number] Account #: [id number] Date of Birth: 13-Oct-1946 Admit Type: Inpatient Age: 77 Room: [facility] ENDO ROOM 4 Gender: Male Note Status: Finalized Instrument Name: Upper Endoscope [id number] Procedure:             Upper GI endoscopy Indications:           Hematemesis Providers:             Midge Minium MD, MD Referring MD:          Oswaldo Done. Hessie Diener MD, MD (Referring MD) Medicines:             Propofol per Anesthesia Complications:         No immediate complications. Procedure:             Pre-Anesthesia Assessment:                        - Prior to the procedure, a History and Physical was                         performed, and patient medications and allergies were                         reviewed. The patient's tolerance of previous                         anesthesia was also reviewed. The risks and benefits                         of the procedure and the sedation options and risks                         were discussed with the patient. All questions were                         answered, and informed consent was obtained. Prior                         Anticoagulants: The patient has taken no anticoagulant                         or antiplatelet agents. ASA Grade Assessment: II - A                         patient with mild systemic disease. After reviewing                         the risks and benefits, the patient was deemed in                         satisfactory condition to undergo the procedure.                        After obtaining informed consent, the endoscope was                         passed under direct vision. Throughout the procedure,  the patient's blood pressure, pulse, and oxygen                         saturations were monitored continuously. The Endoscope                         was introduced through the mouth, and advanced  to the                         second part of duodenum. The upper GI endoscopy was                         accomplished without difficulty. The patient tolerated                         the procedure well. Findings:      A medium-sized hiatal hernia was present.      The stomach was normal.      The examined duodenum was normal. Impression:            - Medium-sized hiatal hernia.                        - Normal stomach.                        - Normal examined duodenum.                        - No specimens collected. Recommendation:        - Discharge patient to home.                        - Resume previous diet.                        - Continue present medications. Procedure Code(s):     --- Professional ---                        [phone], Esophagogastroduodenoscopy, flexible,                         transoral; diagnostic, including collection of                         specimen(s) by brushing or washing, when performed                         (separate procedure) Diagnosis Code(s):     --- Professional ---                        K92.0, Hematemesis CPT copyright 2022 American Medical Association. All rights reserved. The codes documented in this report are preliminary and upon coder review may  be revised to meet current compliance requirements. Midge Minium MD, MD 12/11/2023 11:50:45 AM This report has been signed electronically. Number of Addenda: 0 Note Initiated On: 12/11/2023 11:20 AM Estimated Blood Loss:  Estimated blood loss: none.      [facility]

## 2023-12-11 NOTE — Progress Notes (Signed)
Patient brought to unit by tech, ambulated to bed, pt on 2 L of 02 via nasal cannula. Bilateral wheezing heard on auscultation, Requested for respiratory to give a breathing treatment. No c/o pain. Iv intact. SCD and Tele attached. Orientated to room and call [email or web address] on.

## 2023-12-11 NOTE — Plan of Care (Signed)
  Problem: Education: Goal: Knowledge of General Education information will improve Description: Including pain rating scale, medication(s)/side effects and non-pharmacologic comfort measures Outcome: Progressing   Problem: Activity: Goal: Risk for activity intolerance will decrease Outcome: Progressing   Problem: Nutrition: Goal: Adequate nutrition will be maintained Outcome: Progressing   Problem: Coping: Goal: Level of anxiety will decrease Outcome: Progressing   Problem: Pain Management: Goal: General experience of comfort will improve Outcome: Progressing   Problem: Safety: Goal: Ability to remain free from injury will improve Outcome: Progressing   Problem: Skin Integrity: Goal: Risk for impaired skin integrity will decrease Outcome: Progressing
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
## 2023-12-12 ENCOUNTER — Inpatient Hospital Stay: Payer: Medicare HMO

## 2023-12-12 DIAGNOSIS — K922 Gastrointestinal hemorrhage, unspecified: Secondary | ICD-10-CM | POA: Diagnosis not present

## 2023-12-12 LAB — CBC WITH DIFFERENTIAL/PLATELET
Abs Immature Granulocytes: 0.03 10*3/uL (ref 0.00–0.07)
Basophils Absolute: 0 10*3/uL (ref 0.0–0.1)
Basophils Relative: 1 %
Eosinophils Absolute: 0 10*3/uL (ref 0.0–0.5)
Eosinophils Relative: 1 %
HCT: 30.3 % — ABNORMAL LOW (ref 39.0–52.0)
Hemoglobin: 10 g/dL — ABNORMAL LOW (ref 13.0–17.0)
Immature Granulocytes: 1 %
Lymphocytes Relative: 16 %
Lymphs Abs: 1.1 10*3/uL (ref 0.7–4.0)
MCH: 31.3 pg (ref 26.0–34.0)
MCHC: 33 g/dL (ref 30.0–36.0)
MCV: 94.7 fL (ref 80.0–100.0)
Monocytes Absolute: 0.9 10*3/uL (ref 0.1–1.0)
Monocytes Relative: 13 %
Neutro Abs: 4.5 10*3/uL (ref 1.7–7.7)
Neutrophils Relative %: 68 %
Platelets: 202 10*3/uL (ref 150–400)
RBC: 3.2 MIL/uL — ABNORMAL LOW (ref 4.22–5.81)
RDW: 17.7 % — ABNORMAL HIGH (ref 11.5–15.5)
WBC: 6.5 10*3/uL (ref 4.0–10.5)
nRBC: 0 % (ref 0.0–0.2)

## 2023-12-12 MED ORDER — HYDROCODONE-ACETAMINOPHEN 5-325 MG PO TABS
1.0000 | ORAL_TABLET | Freq: Four times a day (QID) | ORAL | Status: DC | PRN
  Administered 2023-12-12 (×2): 1 via ORAL
  Filled 2023-12-12 (×2): qty 1

## 2023-12-12 MED ORDER — ACETAMINOPHEN 325 MG PO TABS: 650.0000 mg | ORAL_TABLET | Freq: Four times a day (QID) | ORAL | Status: DC | PRN

## 2023-12-12 MED ORDER — DM-GUAIFENESIN ER 30-600 MG PO TB12
1.0000 | ORAL_TABLET | Freq: Two times a day (BID) | ORAL | Status: DC | PRN
  Administered 2023-12-13 – 2023-12-17 (×5): 1 via ORAL
  Filled 2023-12-12 (×5): qty 1

## 2023-12-12 MED ORDER — FUROSEMIDE 10 MG/ML IJ SOLN
40.0000 mg | Freq: Two times a day (BID) | INTRAMUSCULAR | Status: DC
  Administered 2023-12-12 – 2023-12-14 (×5): 40 mg via INTRAVENOUS
  Filled 2023-12-12 (×5): qty 4

## 2023-12-12 NOTE — Progress Notes (Signed)
PROGRESS NOTE    Logan Vazquez  BJY:[id number] DOB: 19-Aug-1946 DOA: 12/10/2023 PCP: Oswaldo Conroy, MD  Chief Complaint  Patient presents with   Blood In Stools   Hematemesis    Hospital Course:  Logan Vazquez is 77 y.o. male with CAD, AAA s/p repair, chronic iron deficiency anemia, asthma, COPD, hypertension, chronic back pain, who presents with an upper GI bleed.  Patient endorsed recurrent epigastric pain over the last 24 hours with new episodes of hematemesis.  Denies history of alcohol use.  Smokes about 4 cigarettes daily.  Does have history of atrial fibrillation, on anticoagulation. Denies any NSAID use  In ED hemoglobin 12.1.  CT angio GI bleed negative for acute bleeding. Stable aortobiiliac stent endograft repair. Noted masslike narrowing Ventral wall thickening of the mid ascending colon. GI consulted.  Patient underwent EGD on 12/26 which did not reveal any acute or old bleeding.  GI reports likely Mallory-Weiss tear which may have already healed without sign of tissue disruption.  subjective: On evaluation this morning patient is dyspneic and hypoxic.  He denies a history of congestive heart failure   Objective: Vitals:   12/12/23 0354 12/12/23 0527 12/12/23 0832 12/12/23 1216  BP: 135/71 133/61 117/65 120/62  Pulse: 88 86 76 77  Resp: 20 18 18 19   Temp: 99.4 F (37.4 C) 99.8 F (37.7 C) 99.1 F (37.3 C)   TempSrc: Oral Oral    SpO2: 91% 93% 93% 94%  Weight:      Height:         Intake/Output Summary (Last 24 hours) at 12/12/2023 1411 Last data filed at 12/11/2023 2331 Gross per 24 hour  Intake 0 ml  Output 400 ml  Net -400 ml   Filed Weights   12/11/23 1124 12/11/23 2100  Weight: 54.9 kg 54.9 kg    Examination: General exam: Appears calm and comfortable, NAD, thin Respiratory system: No work of breathing, symmetric chest wall expansion, occasional cough, some upper airway rales Cardiovascular system: S1 & S2 heard, RRR.  Gastrointestinal  system: Abdomen is nondistended, soft and nontender.  Neuro: Alert and oriented. No focal neurological deficits. Extremities: Symmetric, expected ROM Skin: No rashes, lesions Psychiatry: Demonstrates appropriate judgement and insight. Mood & affect appropriate for situation.   Assessment & Plan:  Principal Problem:   Upper GI bleed Active Problems:   Atrial fibrillation (HCC)   Essential hypertension   Abdominal aortic aneurysm (AAA) (HCC)   COPD (chronic obstructive pulmonary disease) (HCC)   Tobacco dependence   Ischemic stroke (HCC)   Coronary artery disease involving native coronary artery of native heart without angina pectoris     Pulmonary edema - Vascular congestion on chest x-ray this morning with concurrent hypoxia - Start IV Lasix 40 mg twice daily, taper as tolerated -- Strict I's and O's - Echocardiogram ordered and pending - Most recent echo 02/2022: EF 65%, grade 1 Diastolic dysfunction - Patient denies a history of heart failure - He did receive some IV fluids yesterday.  Upper GI bleed - CT angio negative for acute bleed but did reveal narrowing in the colon that could represent mass versus underdistention. - GI consulted - EGD 12/26: without acute bleed or evidence of old blood.  Suspected healed Mallory-Weiss tear. - On PPI now. - Tolerating diet, no further vomiting - Continue trending CBC.  Hemoglobin stable  Atrial fibrillation - On Eliquis at home - EKG NSR on arrival. Intermittent Afib today. - Hold anticoagulation for now in setting of  GI bleed - Currently rate controlled, resume home meds   Hypertension -- Controlled now -- As needed labetalol and hydralazine  AAA Status post repair with no evidence of endoleak on CT  COPD - Not currently in exacerbation.  On room air - Continue home meds  CAD involving native coronary artery of native heart without angina - Baseline nonobstructive CAD - Continue home meds  History of ischemic  stroke - History of multiple prior strokes in the past with chronic lower extremity weakness - Continue home meds  Tobacco dependence - Endorses quarter pack per day smoker - Cessation counseling provided - Nicotine patch for now   DVT prophylaxis: SCDs   Code Status: Full Code Family Communication: none at bedside, discussed directly with pt Disposition:  Status is: Inpatient, now with IV diuresis.  Echocardiogram pending  Consultants:  GI  Procedures:  EGD 12/26  Antimicrobials:  Anti-infectives (From admission, onward)    Start     Dose/Rate Route Frequency Ordered Stop   12/10/23 1445  cefTRIAXone (ROCEPHIN) 1 g in sodium chloride 0.9 % 100 mL IVPB  Status:  Discontinued        1 g 200 mL/hr over 30 Minutes Intravenous STAT 12/10/23 1430 12/10/23 1430       Data Reviewed: I have personally reviewed following labs and imaging studies CBC: Recent Labs  Lab 12/10/23 0950 12/10/23 1341 12/11/23 0627 12/11/23 2125 12/12/23 0826  WBC 14.0*  --   --   --  6.5  NEUTROABS  --   --   --   --  4.5  HGB 12.1* 11.5* 11.2* 10.9* 10.0*  HCT 37.8* 35.2* 33.9* 33.9* 30.3*  MCV 96.4  --   --   --  94.7  PLT 299  --   --   --  202   Basic Metabolic Panel: Recent Labs  Lab 12/10/23 0950 12/11/23 0627  NA 138 138  K 4.2 3.6  CL 101 106  CO2 24 24  GLUCOSE 120* 136*  BUN 27* 27*  CREATININE 1.15 1.09  CALCIUM 8.7* 8.2*   GFR: Estimated Creatinine Clearance: 43.4 mL/min (by C-G formula based on SCr of 1.09 mg/dL). Liver Function Tests: Recent Labs  Lab 12/10/23 0950 12/11/23 0627  AST 21 47*  ALT 18 48*  ALKPHOS 90 96  BILITOT 0.9 1.3*  PROT 7.3 6.6  ALBUMIN 4.1 3.5   CBG: No results for input(s): "GLUCAP" in the last 168 hours.  No results found for this or any previous visit (from the past 240 hours).   Radiology Studies: DG Chest Port 1 View Result Date: 12/12/2023 CLINICAL DATA:  Worsening hypoxia. EXAM: PORTABLE CHEST 1 VIEW COMPARISON:   12/10/2023 FINDINGS: Stable cardiomegaly. Increased diffuse interstitial infiltrates, consistent with interstitial edema. New small left pleural effusion and left basilar atelectasis versus infiltrate. IMPRESSION: Increased diffuse interstitial edema. New small left pleural effusion with left basilar atelectasis versus infiltrate. Electronically Signed   By: Danae Orleans M.D.   On: 12/12/2023 11:05    Scheduled Meds:  atorvastatin  40 mg Oral Daily   busPIRone  15 mg Oral TID   FLUoxetine  60 mg Oral q morning   furosemide  40 mg Intravenous Q12H   influenza vaccine adjuvanted  0.5 mL Intramuscular Tomorrow-1000   nicotine  7 mg Transdermal Daily   pregabalin  75 mg Oral TID   traZODone  50 mg Oral QHS   Continuous Infusions:     LOS: 2 days  Time spent:   Debarah Crape, DO [facility]  To contact the attending physician between 7A-7P please use Epic Chat. To contact the covering physician during after hours 7P-7A, please review Amion.   12/12/2023, 2:11 PM   *This document has been created with the assistance of dictation software. Please excuse typographical errors. *

## 2023-12-12 NOTE — Evaluation (Signed)
Occupational Therapy Evaluation Patient Details Name: Logan Vazquez MRN: [id number] DOB: 1946-02-02 Today's Date: 12/12/2023   History of Present Illness 77 y.o. male with medical history significant of CAD, AAA status postrepair, chronic iron deficiency anemia, asthma, COPD, hypertension, chronic back pain presenting with upper GI bleed.  Patient reports recurrent epigastric pain over the past 24 hours.  Has had multiple episodes of bloody vomiting. Cough and shortness of breath.   Clinical Impression   Pt was seen for OT evaluation this date. Prior to hospital admission, pt was a poor historian, but did mention he lived in a group home where they provided meals. He was able to perform his own ADLs and mentioned use of a cane.  Pt presents to acute OT demonstrating impaired ADL performance and functional mobility 2/2 weakness, low activity tolerance and limited safety (See OT problem list for additional functional deficits). Pt currently requires SUP for bed mobility, SUP/MOD I for STS and SUP to perform oral care and face washing standing at the sink. HR up to 146 with this, returned to seated EOB and was able to scoot laterally however HR did not return to 90's until about 3-4 mins later. Nurse was notified. Pt reported he felt fine, tele monitor stating A-fib.  Pt would benefit from skilled OT services to address noted impairments and functional limitations (see below for any additional details) in order to maximize safety and independence while minimizing falls risk and caregiver burden. Do anticipate the need for follow up OT services upon acute hospital DC.        If plan is discharge home, recommend the following: A little help with walking and/or transfers;A little help with bathing/dressing/bathroom;Help with stairs or ramp for entrance;Assist for transportation;Assistance with cooking/housework;Supervision due to cognitive status;Direct supervision/assist for medications management;Direct  supervision/assist for financial management    Functional Status Assessment  Patient has had a recent decline in their functional status and demonstrates the ability to make significant improvements in function in a reasonable and predictable amount of time.  Equipment Recommendations  Other (comment);Tub/shower seat;BSC/3in1 (RW)    Recommendations for Other Services       Precautions / Restrictions Precautions Precautions: Fall Restrictions Weight Bearing Restrictions Per Provider Order: No      Mobility Bed Mobility Overal bed mobility: Needs Assistance Bed Mobility: Supine to Sit, Sit to Supine     Supine to sit: Supervision, HOB elevated, Used rails Sit to supine: Supervision, Used rails   General bed mobility comments: use of bed rails, no physical assist provided    Transfers Overall transfer level: Modified independent Equipment used: Rolling walker (2 wheels)               General transfer comment: MOD I for STS and able to stand at sink to perform oral care and wash face with SUP noted forward flexed posture-reports this is baseline from using a cane      Balance Overall balance assessment: Needs assistance Sitting-balance support: No upper extremity supported Sitting balance-Leahy Scale: Good     Standing balance support: During functional activity, Reliant on assistive device for balance Standing balance-Leahy Scale: Fair Standing balance comment: reliant on the walker, no LOB, forward flexed posture; SUP to perform dynamic standing tasks                           ADL either performed or assessed with clinical judgement   ADL Overall ADL's : Needs assistance/impaired  Grooming: Wash/dry face;Oral care;Standing;Supervision/safety                                       Vision         Perception         Praxis         Pertinent Vitals/Pain Pain Assessment Pain Assessment: No/denies pain      Extremity/Trunk Assessment Upper Extremity Assessment Upper Extremity Assessment: Generalized weakness   Lower Extremity Assessment Lower Extremity Assessment: Generalized weakness   Cervical / Trunk Assessment Cervical / Trunk Assessment: Kyphotic   Communication Communication Communication: No apparent difficulties Cueing Techniques: Verbal cues   Cognition Arousal: Alert Behavior During Therapy: WFL for tasks assessed/performed Overall Cognitive Status: Difficult to assess                                 General Comments: knew he was in the hospital and hoping to DC today with family coming to visit from out of town     General Comments  HR up to 146 with activity took 3-4 mins to recover to 90's with return to bed    Exercises Other Exercises Other Exercises: Edu in role of acute OT services and importance of therapy to maximize strength/IND/safety.   Shoulder Instructions      Home Living Family/patient expects to be discharged to:: Group home   Available Help at Discharge: Available 24 hours/day                             Additional Comments: notes from last year indicate he lived at home alone, he can't differentiate if he is currently at group home, SNF or ALF but states "they provide 3 meals a day at the place" told OT he lives in a group home      Prior Functioning/Environment Prior Level of Function : Patient poor historian/Family not available             Mobility Comments: reports he walks to/from dining area; reported use of  SPC ADLs Comments: reports he dresses, etc himself, does sponge baths        OT Problem List: Decreased strength;Decreased activity tolerance;Impaired balance (sitting and/or standing)      OT Treatment/Interventions: Self-care/ADL training;Therapeutic exercise;Therapeutic activities;Energy conservation;Patient/family education;DME and/or AE instruction;Balance training    OT Goals(Current  goals can be found in the care plan section) Acute Rehab OT Goals Patient Stated Goal: improve endurance OT Goal Formulation: With patient Time For Goal Achievement: 12/26/23 Potential to Achieve Goals: Fair ADL Goals Pt Will Perform Lower Body Bathing: with supervision;with adaptive equipment;sitting/lateral leans;sit to/from stand Pt Will Perform Lower Body Dressing: with supervision;with adaptive equipment;sitting/lateral leans;sit to/from stand Pt Will Transfer to Toilet: with supervision;ambulating;regular height toilet Pt Will Perform Toileting - Clothing Manipulation and hygiene: with supervision;sit to/from stand;sitting/lateral leans Additional ADL Goal #1: Pt will demo/verbalize use of ECS, PLB, compensatory strategies/AE/AD to maximize IND and prevent overexertion during ADL performance.  OT Frequency: Min 1X/week    Co-evaluation              AM-PAC OT "6 Clicks" Daily Activity     Outcome Measure Help from another person eating meals?: None Help from another person taking care of personal grooming?: None Help from another person toileting, which includes  using toliet, bedpan, or urinal?: A Little Help from another person bathing (including washing, rinsing, drying)?: A Little Help from another person to put on and taking off regular upper body clothing?: None Help from another person to put on and taking off regular lower body clothing?: A Little 6 Click Score: 21   End of Session Equipment Utilized During Treatment: Rolling walker (2 wheels) Nurse Communication: Mobility status  Activity Tolerance: Patient tolerated treatment well;Patient limited by fatigue Patient left: in bed;with call bell/phone within reach;with bed alarm set  OT Visit Diagnosis: Other abnormalities of gait and mobility (R26.89);Muscle weakness (generalized) (M62.81)                Time: 1610-9604 OT Time Calculation (min): 18 min Charges:  OT General Charges $OT Visit: 1 Visit OT  Evaluation $OT Eval Low Complexity: 1 Low Yareni Creps, OTR/L  12/12/23, 10:57 AM  Constance Goltz 12/12/2023, 10:54 AM

## 2023-12-13 ENCOUNTER — Inpatient Hospital Stay (HOSPITAL_COMMUNITY)
Admit: 2023-12-13 | Discharge: 2023-12-13 | Disposition: A | Discharge status: Home / Self Care | Payer: Medicare HMO | Attending: Family Medicine | Admitting: Family Medicine

## 2023-12-13 DIAGNOSIS — K922 Gastrointestinal hemorrhage, unspecified: Secondary | ICD-10-CM | POA: Diagnosis not present

## 2023-12-13 DIAGNOSIS — R008 Other abnormalities of heart beat: Secondary | ICD-10-CM

## 2023-12-13 LAB — ECHOCARDIOGRAM COMPLETE
AR max vel: 3.29 cm2
AV Peak grad: 11.1 mm[Hg]
Ao pk vel: 1.67 m/s
Area-P 1/2: 3.85 cm2
Height: 69 in
MV M vel: 3.14 m/s
MV Peak grad: 39.4 mm[Hg]
P 1/2 time: 547 ms
S' Lateral: 3 cm
Weight: 1936.52 [oz_av]

## 2023-12-13 MED ORDER — TRAMADOL HCL 50 MG PO TABS
50.0000 mg | ORAL_TABLET | Freq: Two times a day (BID) | ORAL | Status: DC | PRN
  Administered 2023-12-13 – 2023-12-23 (×11): 50 mg via ORAL
  Filled 2023-12-13 (×12): qty 1

## 2023-12-13 NOTE — Plan of Care (Signed)

## 2023-12-13 NOTE — Progress Notes (Signed)
PROGRESS NOTE    Logan Vazquez  [name]:[id number] DOB: Nov 26, 1946 DOA: 12/10/2023 PCP: Oswaldo Conroy, MD  Chief Complaint  Patient presents with   Blood In Stools   Hematemesis    Hospital Course:  Logan Vazquez is 77 y.o. male with CAD, AAA s/p repair, chronic iron deficiency anemia, asthma, COPD, hypertension, chronic back pain, who presents with an upper GI bleed.  Patient endorsed recurrent epigastric pain over the last 24 hours with new episodes of hematemesis.  Denies history of alcohol use.  Smokes about 4 cigarettes daily.  Does have history of atrial fibrillation, on anticoagulation. Denies any NSAID use  In ED hemoglobin 12.1.  CT angio GI bleed negative for acute bleeding. Stable aortobiiliac stent endograft repair. Noted masslike narrowing Ventral wall thickening of the mid ascending colon. GI consulted.  Patient underwent EGD on 12/26 which did not reveal any acute or old bleeding.  GI reports likely Mallory-Weiss tear which may have already healed without sign of tissue disruption.  subjective: Still dyspneic and hypoxic today. Unable to wean to room air. ECHO done, read pending.   Objective: Vitals:   12/12/23 1627 12/12/23 2108 12/13/23 0618 12/13/23 0724  BP: 124/62 127/71 133/66 137/62  Pulse: 76 78 91 82  Resp: 18 18 19 15   Temp: 98.2 F (36.8 C) 98.4 F (36.9 C) 99.1 F (37.3 C) 98.6 F (37 C)  TempSrc:      SpO2: 99% 95% 97% 93%  Weight:      Height:         Intake/Output Summary (Last 24 hours) at 12/13/2023 1408 Last data filed at 12/13/2023 0945 Gross per 24 hour  Intake --  Output 1650 ml  Net -1650 ml   Filed Weights   12/11/23 1124 12/11/23 2100  Weight: 54.9 kg 54.9 kg    Examination: General exam: Appears calm and comfortable, NAD, thin Respiratory system: No work of breathing, symmetric chest wall expansion, occasional cough, some upper airway rales Cardiovascular system: S1 & S2 heard, RRR.  Gastrointestinal system: Abdomen is  nondistended, soft and nontender.  Neuro: Alert and oriented. No focal neurological deficits. Extremities: Symmetric, expected ROM Skin: No rashes, lesions Psychiatry: Demonstrates appropriate judgement and insight. Mood & affect appropriate for situation.   Assessment & Plan:  Principal Problem:   Upper GI bleed Active Problems:   Atrial fibrillation (HCC)   Essential hypertension   Abdominal aortic aneurysm (AAA) (HCC)   COPD (chronic obstructive pulmonary disease) (HCC)   Tobacco dependence   Ischemic stroke (HCC)   Coronary artery disease involving native coronary artery of native heart without angina pectoris     Pulmonary edema - Vascular congestion on CXR w concurrent hypoxia - Cont IV Lasix 40 mg twice daily, taper as tolerated -- Strict I's and O's, -2.6L so far - Echocardiogram ordered and pending - Most recent echo 02/2022: EF 65%, grade 1 Diastolic dysfunction - Patient denies a history of heart failure - He did receive some IV fluids on day of admission.  Upper GI bleed - CT angio negative for acute bleed but did reveal narrowing in the colon that could represent mass versus underdistention. - GI consulted - EGD 12/26: without acute bleed or evidence of old blood.  Suspected healed Mallory-Weiss tear. - On PPI now. - Tolerating diet, no further vomiting - Continue trending CBC.  Hemoglobin stable  Paroxsymal Atrial fibrillation - On Eliquis at home - EKG NSR on arrival.  - Hold anticoagulation for now in setting  of GI bleed - Currently rate controlled, resume home meds   Hypertension -- Controlled now -- As needed labetalol and hydralazine  AAA Status post repair with no evidence of endoleak on CT  COPD - Not currently in exacerbation.  On room air - Continue home meds  CAD involving native coronary artery of native heart without angina - Baseline nonobstructive CAD - Continue home meds  History of ischemic stroke - History of multiple prior  strokes in the past with chronic lower extremity weakness - Continue home meds  Tobacco dependence - Endorses quarter pack per day smoker - Cessation counseling provided - Nicotine patch for now  Chronic Back pain -Takes TID tramadol and lyrica outpt  DVT prophylaxis: SCDs   Code Status: Full Code Family Communication: none at bedside, discussed directly with pt Disposition:  Status is: Inpatient, now with IV diuresis.  Echocardiogram pending  Consultants:  GI  Procedures:  EGD 12/26  Antimicrobials:  Anti-infectives (From admission, onward)    Start     Dose/Rate Route Frequency Ordered Stop   12/10/23 1445  cefTRIAXone (ROCEPHIN) 1 g in sodium chloride 0.9 % 100 mL IVPB  Status:  Discontinued        1 g 200 mL/hr over 30 Minutes Intravenous STAT 12/10/23 1430 12/10/23 1430       Data Reviewed: I have personally reviewed following labs and imaging studies CBC: Recent Labs  Lab 12/10/23 0950 12/10/23 1341 12/11/23 0627 12/11/23 2125 12/12/23 0826  WBC 14.0*  --   --   --  6.5  NEUTROABS  --   --   --   --  4.5  HGB 12.1* 11.5* 11.2* 10.9* 10.0*  HCT 37.8* 35.2* 33.9* 33.9* 30.3*  MCV 96.4  --   --   --  94.7  PLT 299  --   --   --  202   Basic Metabolic Panel: Recent Labs  Lab 12/10/23 0950 12/11/23 0627  NA 138 138  K 4.2 3.6  CL 101 106  CO2 24 24  GLUCOSE 120* 136*  BUN 27* 27*  CREATININE 1.15 1.09  CALCIUM 8.7* 8.2*   GFR: Estimated Creatinine Clearance: 43.4 mL/min (by C-G formula based on SCr of 1.09 mg/dL). Liver Function Tests: Recent Labs  Lab 12/10/23 0950 12/11/23 0627  AST 21 47*  ALT 18 48*  ALKPHOS 90 96  BILITOT 0.9 1.3*  PROT 7.3 6.6  ALBUMIN 4.1 3.5   CBG: No results for input(s): "GLUCAP" in the last 168 hours.  No results found for this or any previous visit (from the past 240 hours).   Radiology Studies: DG Chest Port 1 View Result Date: 12/12/2023 CLINICAL DATA:  Worsening hypoxia. EXAM: PORTABLE CHEST 1 VIEW  COMPARISON:  12/10/2023 FINDINGS: Stable cardiomegaly. Increased diffuse interstitial infiltrates, consistent with interstitial edema. New small left pleural effusion and left basilar atelectasis versus infiltrate. IMPRESSION: Increased diffuse interstitial edema. New small left pleural effusion with left basilar atelectasis versus infiltrate. Electronically Signed   By: Danae Orleans M.D.   On: 12/12/2023 11:05    Scheduled Meds:  atorvastatin  40 mg Oral Daily   busPIRone  15 mg Oral TID   FLUoxetine  60 mg Oral q morning   furosemide  40 mg Intravenous BID   influenza vaccine adjuvanted  0.5 mL Intramuscular Tomorrow-1000   nicotine  7 mg Transdermal Daily   pregabalin  75 mg Oral TID   traZODone  50 mg Oral QHS   Continuous Infusions:  LOS: 3 days    Time spent:   Debarah Crape, DO [facility]  To contact the attending physician between 7A-7P please use Epic Chat. To contact the covering physician during after hours 7P-7A, please review Amion.   12/13/2023, 2:08 PM   *This document has been created with the assistance of dictation software. Please excuse typographical errors. *

## 2023-12-13 NOTE — Plan of Care (Signed)

## 2023-12-13 NOTE — Progress Notes (Signed)
Echocardiogram 2D Echocardiogram has been performed.  Logan Vazquez 12/13/2023, 12:01 PM

## 2023-12-14 DIAGNOSIS — K922 Gastrointestinal hemorrhage, unspecified: Secondary | ICD-10-CM | POA: Diagnosis not present

## 2023-12-14 LAB — RESP PANEL BY RT-PCR (RSV, FLU A&B, COVID)  RVPGX2
Influenza A by PCR: NEGATIVE
Influenza B by PCR: NEGATIVE
Resp Syncytial Virus by PCR: NEGATIVE
SARS Coronavirus 2 by RT PCR: NEGATIVE

## 2023-12-14 MED ORDER — METHYLPREDNISOLONE SODIUM SUCC 125 MG IJ SOLR
80.0000 mg | Freq: Every day | INTRAMUSCULAR | Status: DC
  Administered 2023-12-14 – 2023-12-16 (×3): 80 mg via INTRAVENOUS
  Filled 2023-12-14 (×3): qty 2

## 2023-12-14 NOTE — Progress Notes (Signed)
PROGRESS NOTE    Logan Vazquez  [name]:[id number] DOB: October 23, 1946 DOA: 12/10/2023 PCP: Oswaldo Conroy, MD  Chief Complaint  Patient presents with   Blood In Stools   Hematemesis    Hospital Course:  Logan Vazquez is 77 y.o. male with CAD, AAA s/p repair, chronic iron deficiency anemia, asthma, COPD, hypertension, chronic back pain, who presents with an upper GI bleed.  Patient endorsed recurrent epigastric pain over the last 24 hours with new episodes of hematemesis.  Denies history of alcohol use.  Smokes about 4 cigarettes daily.  Does have history of atrial fibrillation, on anticoagulation. Denies any NSAID use  In ED hemoglobin 12.1.  CT angio GI bleed negative for acute bleeding. Stable aortobiiliac stent endograft repair. Noted masslike narrowing Ventral wall thickening of the mid ascending colon. GI consulted.  Patient underwent EGD on 12/26 which did not reveal any acute or old bleeding.  GI reports likely Mallory-Weiss tear which may have already healed without sign of tissue disruption.  subjective: Still dyspneic and hypoxic today. Unable to wean to room air.   Objective: Vitals:   12/13/23 1619 12/13/23 2016 12/14/23 0417 12/14/23 0750  BP: 123/72 (!) 111/58 126/65 117/67  Pulse: 89 79 86 78  Resp: 16 16 20 16   Temp: 98.6 F (37 C) 99.2 F (37.3 C) 99 F (37.2 C) 98.4 F (36.9 C)  TempSrc:      SpO2: 93% 95% 90% 90%  Weight:      Height:         Intake/Output Summary (Last 24 hours) at 12/14/2023 1549 Last data filed at 12/14/2023 1204 Gross per 24 hour  Intake --  Output 1100 ml  Net -1100 ml   Filed Weights   12/11/23 1124 12/11/23 2100  Weight: 54.9 kg 54.9 kg    Examination: General exam: Appears calm and comfortable, NAD, thin Respiratory system: No work of breathing, symmetric chest wall expansion, occasional cough, some upper airway rales, diffuse wheezing bilaterally.  Cardiovascular system: S1 & S2 heard, RRR.  Gastrointestinal system:  Abdomen is nondistended, soft and nontender.  Neuro: Alert and oriented. No focal neurological deficits. Extremities: Symmetric, expected ROM Skin: No rashes, lesions Psychiatry: Demonstrates appropriate judgement and insight. Mood & affect appropriate for situation.   Assessment & Plan:  Principal Problem:   Upper GI bleed Active Problems:   Atrial fibrillation (HCC)   Essential hypertension   Abdominal aortic aneurysm (AAA) (HCC)   COPD (chronic obstructive pulmonary disease) (HCC)   Tobacco dependence   Ischemic stroke (HCC)   Coronary artery disease involving native coronary artery of native heart without angina pectoris     Pulmonary edema Heart failure with preserved EF - Vascular congestion on CXR w concurrent hypoxia - Cont IV Lasix 40 mg twice daily, taper as tolerated -- Strict I's and O's, -2.6L so far - Echocardiogram: LVEF 60 to 65%, no regional wall motion abnormalities grade 1 diastolic dysfunction. -Patient reports this is a new diagnosis to him - Currently on IV Lasix, twice daily.  Wean to p.o. when diuresis slows.  Has put out almost 4 L since initiation. - He did receive some IV fluids on day of admission.  Acute hypoxic respiratory failure - Secondary to pulmonary edema, diuresis as above - Patient does have diffuse wheezing on exam today.  Suspect he may have concurrent COPD exacerbation - Flu and COVID-negative - Initiate Methylpred, monitor for improvement.  COPD, now with exacerbation - Methylpred as above - Continue home meds  Upper GI bleed - CT angio negative for acute bleed but did reveal narrowing in the colon that could represent mass versus underdistention. - GI consulted - EGD 12/26: without acute bleed or evidence of old blood.  Suspected healed Mallory-Weiss tear. - On PPI now. - Tolerating diet, no further vomiting - Continue trending CBC.  Hemoglobin stable  Paroxsymal Atrial fibrillation - On Eliquis at home - EKG NSR on  arrival.  - Hold anticoagulation for now in setting of GI bleed - Currently rate controlled, resume home meds   Hypertension -- Controlled now -- As needed labetalol and hydralazine  AAA Status post repair with no evidence of endoleak on CT  CAD involving native coronary artery of native heart without angina - Baseline nonobstructive CAD - Continue home meds  History of ischemic stroke - History of multiple prior strokes in the past with chronic lower extremity weakness - Continue home meds  Tobacco dependence - Endorses quarter pack per day smoker - Cessation counseling provided - Nicotine patch for now  Chronic Back pain -Takes TID tramadol and lyrica outpt  DVT prophylaxis: SCDs   Code Status: Full Code Family Communication: none at bedside, discussed directly with pt Disposition:  Status is: Inpatient, now with IV diuresis and IV steroids. Still requiring O2.  Consultants:  GI  Procedures:  EGD 12/26  Antimicrobials:  Anti-infectives (From admission, onward)    Start     Dose/Rate Route Frequency Ordered Stop   12/10/23 1445  cefTRIAXone (ROCEPHIN) 1 g in sodium chloride 0.9 % 100 mL IVPB  Status:  Discontinued        1 g 200 mL/hr over 30 Minutes Intravenous STAT 12/10/23 1430 12/10/23 1430       Data Reviewed: I have personally reviewed following labs and imaging studies CBC: Recent Labs  Lab 12/10/23 0950 12/10/23 1341 12/11/23 0627 12/11/23 2125 12/12/23 0826  WBC 14.0*  --   --   --  6.5  NEUTROABS  --   --   --   --  4.5  HGB 12.1* 11.5* 11.2* 10.9* 10.0*  HCT 37.8* 35.2* 33.9* 33.9* 30.3*  MCV 96.4  --   --   --  94.7  PLT 299  --   --   --  202   Basic Metabolic Panel: Recent Labs  Lab 12/10/23 0950 12/11/23 0627  NA 138 138  K 4.2 3.6  CL 101 106  CO2 24 24  GLUCOSE 120* 136*  BUN 27* 27*  CREATININE 1.15 1.09  CALCIUM 8.7* 8.2*   GFR: Estimated Creatinine Clearance: 43.4 mL/min (by C-G formula based on SCr of 1.09  mg/dL). Liver Function Tests: Recent Labs  Lab 12/10/23 0950 12/11/23 0627  AST 21 47*  ALT 18 48*  ALKPHOS 90 96  BILITOT 0.9 1.3*  PROT 7.3 6.6  ALBUMIN 4.1 3.5   CBG: No results for input(s): "GLUCAP" in the last 168 hours.  Recent Results (from the past 240 hours)  Resp panel by RT-PCR (RSV, Flu A&B, Covid) Anterior Nasal Swab     Status: None   Collection Time: 12/14/23  2:30 PM   Specimen: Anterior Nasal Swab  Result Value Ref Range Status   SARS Coronavirus 2 by RT PCR NEGATIVE NEGATIVE Final    Comment: (NOTE) SARS-CoV-2 target nucleic acids are NOT DETECTED.  The SARS-CoV-2 RNA is generally detectable in upper respiratory specimens during the acute phase of infection. The lowest concentration of SARS-CoV-2 viral copies this assay can detect is  138 copies/mL. A negative result does not preclude SARS-Cov-2 infection and should not be used as the sole basis for treatment or other patient management decisions. A negative result may occur with  improper specimen collection/handling, submission of specimen other than nasopharyngeal swab, presence of viral mutation(s) within the areas targeted by this assay, and inadequate number of viral copies(<138 copies/mL). A negative result must be combined with clinical observations, patient history, and epidemiological information. The expected result is Negative.  Fact Sheet for Patients:  BloggerCourse.com  Fact Sheet for Healthcare Providers:  SeriousBroker.it  This test is no t yet approved or cleared by the [place] FDA and  has been authorized for detection and/or diagnosis of SARS-CoV-2 by FDA under an Emergency Use Authorization (EUA). This EUA will remain  in effect (meaning this test can be used) for the duration of the COVID-19 declaration under Section 564(b)(1) of the Act, 21 U.S.C.section 360bbb-3(b)(1), unless the authorization is terminated  or revoked  sooner.       Influenza A by PCR NEGATIVE NEGATIVE Final   Influenza B by PCR NEGATIVE NEGATIVE Final    Comment: (NOTE) The Xpert Xpress SARS-CoV-2/FLU/RSV plus assay is intended as an aid in the diagnosis of influenza from Nasopharyngeal swab specimens and should not be used as a sole basis for treatment. Nasal washings and aspirates are unacceptable for Xpert Xpress SARS-CoV-2/FLU/RSV testing.  Fact Sheet for Patients: BloggerCourse.com  Fact Sheet for Healthcare Providers: SeriousBroker.it  This test is not yet approved or cleared by the [place] FDA and has been authorized for detection and/or diagnosis of SARS-CoV-2 by FDA under an Emergency Use Authorization (EUA). This EUA will remain in effect (meaning this test can be used) for the duration of the COVID-19 declaration under Section 564(b)(1) of the Act, 21 U.S.C. section 360bbb-3(b)(1), unless the authorization is terminated or revoked.     Resp Syncytial Virus by PCR NEGATIVE NEGATIVE Final    Comment: (NOTE) Fact Sheet for Patients: BloggerCourse.com  Fact Sheet for Healthcare Providers: SeriousBroker.it  This test is not yet approved or cleared by the [place] FDA and has been authorized for detection and/or diagnosis of SARS-CoV-2 by FDA under an Emergency Use Authorization (EUA). This EUA will remain in effect (meaning this test can be used) for the duration of the COVID-19 declaration under Section 564(b)(1) of the Act, 21 U.S.C. section 360bbb-3(b)(1), unless the authorization is terminated or revoked.  Performed at [facility], [address] [place], [place] [address]      Radiology Studies: ECHOCARDIOGRAM COMPLETE Result Date: 12/13/2023    ECHOCARDIOGRAM REPORT   Patient Name:   JAZON FLEMING Date of Exam: 12/13/2023 Medical Rec #:  [id number]      Height:       69.0 in  Accession #:    [id number]     Weight:       121.0 lb Date of Birth:  1946-06-18       BSA:          1.669 m Patient Age:    77 years       BP:           137/62 mmHg Patient Gender: M              HR:           80 bpm. Exam Location:  [facility] Procedure: 2D Echo, Cardiac Doppler and Color Doppler Indications:     Other abnormalities of the heart R00.8  History:  Patient has prior history of Echocardiogram examinations, most                  recent 03/15/2022. CAD and Angina, Stroke and COPD,                  Arrythmias:Atrial Fibrillation, Signs/Symptoms:Chest Pain and                  Syncope; Risk Factors:Hypertension and Current Smoker.  Sonographer:     Lucendia Herrlich RCS Referring Phys:  [id number] Debarah Crape Diagnosing Phys: Chilton Si MD IMPRESSIONS  1. Left ventricular ejection fraction, by estimation, is 60 to 65%. The left ventricle has normal function. The left ventricle has no regional wall motion abnormalities. Left ventricular diastolic parameters are consistent with Grade I diastolic dysfunction (impaired relaxation).  2. Right ventricular systolic function is normal. The right ventricular size is normal. There is normal pulmonary artery systolic pressure.  3. Left atrial size was moderately dilated.  4. The mitral valve is normal in structure. Trivial mitral valve regurgitation. No evidence of mitral stenosis.  5. The aortic valve is tricuspid. Aortic valve regurgitation is moderate. No aortic stenosis is present. Aortic regurgitation PHT measures 547 msec.  6. Pulmonic valve regurgitation is moderate.  7. Aortic dilatation noted. There is mild dilatation of the aortic root, measuring 42 mm. There is mild dilatation of the ascending aorta, measuring 40 mm.  8. The inferior vena cava is normal in size with <50% respiratory variability, suggesting right atrial pressure of 8 mmHg. FINDINGS  Left Ventricle: Left ventricular ejection fraction, by estimation, is 60 to 65%. The left ventricle has  normal function. The left ventricle has no regional wall motion abnormalities. The left ventricular internal cavity size was normal in size. There is  no left ventricular hypertrophy of the septal segment. Left ventricular diastolic parameters are consistent with Grade I diastolic dysfunction (impaired relaxation). Normal left ventricular filling pressure. Right Ventricle: The right ventricular size is normal. No increase in right ventricular wall thickness. Right ventricular systolic function is normal. There is normal pulmonary artery systolic pressure. The tricuspid regurgitant velocity is 2.09 m/s, and  with an assumed right atrial pressure of 8 mmHg, the estimated right ventricular systolic pressure is 25.5 mmHg. Left Atrium: Left atrial size was moderately dilated. Right Atrium: Right atrial size was normal in size. Pericardium: There is no evidence of pericardial effusion. Mitral Valve: The mitral valve is normal in structure. Trivial mitral valve regurgitation. No evidence of mitral valve stenosis. Tricuspid Valve: The tricuspid valve is normal in structure. Tricuspid valve regurgitation is not demonstrated. No evidence of tricuspid stenosis. Aortic Valve: The aortic valve is tricuspid. Aortic valve regurgitation is moderate. Aortic regurgitation PHT measures 547 msec. No aortic stenosis is present. Aortic valve peak gradient measures 11.1 mmHg. Pulmonic Valve: The pulmonic valve was normal in structure. Pulmonic valve regurgitation is moderate. No evidence of pulmonic stenosis. Aorta: Aortic dilatation noted. There is mild dilatation of the aortic root, measuring 42 mm. There is mild dilatation of the ascending aorta, measuring 40 mm. Venous: The inferior vena cava is normal in size with less than 50% respiratory variability, suggesting right atrial pressure of 8 mmHg. IAS/Shunts: No atrial level shunt detected by color flow Doppler.  LEFT VENTRICLE PLAX 2D LVIDd:         4.10 cm   Diastology LVIDs:          3.00 cm   LV e' medial:    10.10 cm/s  LV PW:         0.70 cm   LV E/e' medial:  6.7 LV IVS:        1.19 cm   LV e' lateral:   10.70 cm/s LVOT diam:     2.30 cm   LV E/e' lateral: 6.4 LV SV:         96 LV SV Index:   58 LVOT Area:     4.15 cm  RIGHT VENTRICLE             IVC RV S prime:     20.00 cm/s  IVC diam: 2.20 cm TAPSE (M-mode): 2.2 cm LEFT ATRIUM             Index        RIGHT ATRIUM           Index LA diam:        3.40 cm 2.04 cm/m   RA Area:     10.50 cm LA Vol (A2C):   35.9 ml 21.51 ml/m  RA Volume:   16.80 ml  10.07 ml/m LA Vol (A4C):   59.8 ml 35.83 ml/m LA Biplane Vol: 50.0 ml 29.96 ml/m  AORTIC VALVE                 PULMONIC VALVE AV Area (Vmax): 3.29 cm     PR End Diast Vel: 12.67 msec AV Vmax:        166.50 cm/s AV Peak Grad:   11.1 mmHg LVOT Vmax:      132.00 cm/s LVOT Vmean:     80.900 cm/s LVOT VTI:       0.232 m AI PHT:         547 msec  AORTA Ao Root diam: 4.20 cm Ao Asc diam:  4.00 cm MITRAL VALVE                TRICUSPID VALVE MV Area (PHT): 3.85 cm     TR Peak grad:   17.5 mmHg MV Decel Time: 197 msec     TR Vmax:        209.00 cm/s MR Peak grad: 39.4 mmHg MR Vmax:      314.00 cm/s   SHUNTS MV E velocity: 68.10 cm/s   Systemic VTI:  0.23 m MV A velocity: 108.00 cm/s  Systemic Diam: 2.30 cm MV E/A ratio:  0.63 Chilton Si MD Electronically signed by Chilton Si MD Signature Date/Time: 12/13/2023/5:33:47 PM    Final     Scheduled Meds:  atorvastatin  40 mg Oral Daily   busPIRone  15 mg Oral TID   FLUoxetine  60 mg Oral q morning   furosemide  40 mg Intravenous BID   influenza vaccine adjuvanted  0.5 mL Intramuscular Tomorrow-1000   methylPREDNISolone (SOLU-MEDROL) injection  40 mg Intravenous Q12H   nicotine  7 mg Transdermal Daily   pregabalin  75 mg Oral TID   traZODone  50 mg Oral QHS   Continuous Infusions:     LOS: 4 days    Time spent:   Debarah Crape, DO [facility]  To contact the attending physician between 7A-7P please use  Epic Chat. To contact the covering physician during after hours 7P-7A, please review Amion.   12/14/2023, 3:49 PM   *This document has been created with the assistance of dictation software. Please excuse typographical errors. *

## 2023-12-14 NOTE — Plan of Care (Addendum)
Patient is alert and oriented X 2. Bilateral wheezing heard on auscultation. He is in 2 lit oxygen /min via nasal cannula. Breathing treatment given.    Problem: Education: Goal: Knowledge of General Education information will improve Description: Including pain rating scale, medication(s)/side effects and non-pharmacologic comfort measures Outcome: Progressing   Problem: Health Behavior/Discharge Planning: Goal: Ability to manage health-related needs will improve Outcome: Progressing   Problem: Clinical Measurements: Goal: Ability to maintain clinical measurements within normal limits will improve Outcome: Progressing Goal: Will remain free from infection Outcome: Progressing Goal: Diagnostic test results will improve Outcome: Progressing Goal: Respiratory complications will improve Outcome: Progressing Goal: Cardiovascular complication will be avoided Outcome: Progressing   Problem: Activity: Goal: Risk for activity intolerance will decrease Outcome: Progressing   Problem: Nutrition: Goal: Adequate nutrition will be maintained Outcome: Progressing   Problem: Coping: Goal: Level of anxiety will decrease Outcome: Progressing   Problem: Elimination: Goal: Will not experience complications related to bowel motility Outcome: Progressing Goal: Will not experience complications related to urinary retention Outcome: Progressing   Problem: Pain Management: Goal: General experience of comfort will improve Outcome: Progressing   Problem: Safety: Goal: Ability to remain free from injury will improve Outcome: Progressing   Problem: Skin Integrity: Goal: Risk for impaired skin integrity will decrease Outcome: Progressing

## 2023-12-14 NOTE — Plan of Care (Signed)
  Problem: Education: Goal: Knowledge of General Education information will improve Description: Including pain rating scale, medication(s)/side effects and non-pharmacologic comfort measures Outcome: Progressing   Problem: Activity: Goal: Risk for activity intolerance will decrease Outcome: Progressing   Problem: Nutrition: Goal: Adequate nutrition will be maintained Outcome: Progressing   Problem: Coping: Goal: Level of anxiety will decrease Outcome: Progressing   Problem: Elimination: Goal: Will not experience complications related to bowel motility Outcome: Progressing   Problem: Pain Management: Goal: General experience of comfort will improve Outcome: Progressing   Problem: Safety: Goal: Ability to remain free from injury will improve Outcome: Progressing   Problem: Skin Integrity: Goal: Risk for impaired skin integrity will decrease Outcome: Progressing

## 2023-12-15 DIAGNOSIS — K922 Gastrointestinal hemorrhage, unspecified: Secondary | ICD-10-CM | POA: Diagnosis not present

## 2023-12-15 LAB — CBC WITH DIFFERENTIAL/PLATELET
Abs Immature Granulocytes: 0.13 10*3/uL — ABNORMAL HIGH (ref 0.00–0.07)
Basophils Absolute: 0 10*3/uL (ref 0.0–0.1)
Basophils Relative: 0 %
Eosinophils Absolute: 0 10*3/uL (ref 0.0–0.5)
Eosinophils Relative: 0 %
HCT: 31.2 % — ABNORMAL LOW (ref 39.0–52.0)
Hemoglobin: 10.8 g/dL — ABNORMAL LOW (ref 13.0–17.0)
Immature Granulocytes: 1 %
Lymphocytes Relative: 5 %
Lymphs Abs: 0.5 10*3/uL — ABNORMAL LOW (ref 0.7–4.0)
MCH: 30.7 pg (ref 26.0–34.0)
MCHC: 34.6 g/dL (ref 30.0–36.0)
MCV: 88.6 fL (ref 80.0–100.0)
Monocytes Absolute: 0.2 10*3/uL (ref 0.1–1.0)
Monocytes Relative: 2 %
Neutro Abs: 10.4 10*3/uL — ABNORMAL HIGH (ref 1.7–7.7)
Neutrophils Relative %: 92 %
Platelets: 265 10*3/uL (ref 150–400)
RBC: 3.52 MIL/uL — ABNORMAL LOW (ref 4.22–5.81)
RDW: 16.8 % — ABNORMAL HIGH (ref 11.5–15.5)
WBC: 11.2 10*3/uL — ABNORMAL HIGH (ref 4.0–10.5)
nRBC: 0 % (ref 0.0–0.2)

## 2023-12-15 LAB — BASIC METABOLIC PANEL
Anion gap: 16 — ABNORMAL HIGH (ref 5–15)
BUN: 32 mg/dL — ABNORMAL HIGH (ref 8–23)
CO2: 25 mmol/L (ref 22–32)
Calcium: 8.4 mg/dL — ABNORMAL LOW (ref 8.9–10.3)
Chloride: 90 mmol/L — ABNORMAL LOW (ref 98–111)
Creatinine, Ser: 1.3 mg/dL — ABNORMAL HIGH (ref 0.61–1.24)
GFR, Estimated: 56 mL/min — ABNORMAL LOW (ref >60)
Glucose, Bld: 165 mg/dL — ABNORMAL HIGH (ref 70–99)
Potassium: 3.3 mmol/L — ABNORMAL LOW (ref 3.5–5.1)
Sodium: 131 mmol/L — ABNORMAL LOW (ref 135–145)

## 2023-12-15 LAB — COMPREHENSIVE METABOLIC PANEL
ALT: 65 U/L — ABNORMAL HIGH (ref 0–44)
AST: 59 U/L — ABNORMAL HIGH (ref 15–41)
Albumin: 3 g/dL — ABNORMAL LOW (ref 3.5–5.0)
Alkaline Phosphatase: 163 U/L — ABNORMAL HIGH (ref 38–126)
Anion gap: 14 (ref 5–15)
BUN: 27 mg/dL — ABNORMAL HIGH (ref 8–23)
CO2: 23 mmol/L (ref 22–32)
Calcium: 7.9 mg/dL — ABNORMAL LOW (ref 8.9–10.3)
Chloride: 89 mmol/L — ABNORMAL LOW (ref 98–111)
Creatinine, Ser: 1.1 mg/dL (ref 0.61–1.24)
GFR, Estimated: >60 mL/min (ref >60)
Glucose, Bld: 166 mg/dL — ABNORMAL HIGH (ref 70–99)
Potassium: 3.7 mmol/L (ref 3.5–5.1)
Sodium: 126 mmol/L — ABNORMAL LOW (ref 135–145)
Total Bilirubin: 1.1 mg/dL (ref <1.2)
Total Protein: 6.8 g/dL (ref 6.5–8.1)

## 2023-12-15 LAB — MAGNESIUM: Magnesium: 2 mg/dL (ref 1.7–2.4)

## 2023-12-15 LAB — TROPONIN I (HIGH SENSITIVITY)
Troponin I (High Sensitivity): 11 ng/L (ref <18)
Troponin I (High Sensitivity): 13 ng/L (ref <18)

## 2023-12-15 LAB — PHOSPHORUS: Phosphorus: 4 mg/dL (ref 2.5–4.6)

## 2023-12-15 MED ORDER — IPRATROPIUM-ALBUTEROL 0.5-2.5 (3) MG/3ML IN SOLN
3.0000 mL | Freq: Four times a day (QID) | RESPIRATORY_TRACT | Status: DC
  Administered 2023-12-15 – 2023-12-16 (×2): 3 mL via RESPIRATORY_TRACT
  Filled 2023-12-15 (×2): qty 3

## 2023-12-15 NOTE — Progress Notes (Addendum)
PROGRESS NOTE    Logan Vazquez  [name]:[id number] DOB: August 31, 1946 DOA: 12/10/2023 PCP: Oswaldo Conroy, MD  Chief Complaint  Patient presents with   Blood In Stools   Hematemesis    Hospital Course:  Logan Vazquez is 77 y.o. male with CAD, AAA s/p repair, chronic iron deficiency anemia, asthma, COPD, hypertension, chronic back pain, who presents with an upper GI bleed.  Patient endorsed recurrent epigastric pain over the last 24 hours with new episodes of hematemesis.  Denies history of alcohol use.  Smokes about 4 cigarettes daily.  Does have history of atrial fibrillation, on anticoagulation. Denies any NSAID use  In ED hemoglobin 12.1.  CT angio GI bleed negative for acute bleeding. Stable aortobiiliac stent endograft repair. Noted masslike narrowing Ventral wall thickening of the mid ascending colon. GI consulted.  Patient underwent EGD on 12/26 which did not reveal any acute or old bleeding.  GI reports likely Mallory-Weiss tear which may have already healed without sign of tissue disruption.  subjective: No acute events overnight. On evaluation today patient complains of cough, shortness of breath, also reports that over the last few days he has had some vague numbness over his left jaw.  Denies any pain.  No chest pain. No arm pain.  No weakness.  No headache.  No vision changes.   Objective: Vitals:   12/14/23 1727 12/14/23 1956 12/15/23 0501 12/15/23 0728  BP: 122/75 118/74 132/76 122/74  Pulse: 79 87 73 76  Resp:  16  18  Temp:  98.9 F (37.2 C) 98 F (36.7 C) 98 F (36.7 C)  TempSrc:      SpO2: 94% (!) 89% 90% 95%  Weight:      Height:         Intake/Output Summary (Last 24 hours) at 12/15/2023 0732 Last data filed at 12/14/2023 2345 Gross per 24 hour  Intake --  Output 1975 ml  Net -1975 ml   Filed Weights   12/11/23 1124 12/11/23 2100  Weight: 54.9 kg 54.9 kg    Examination: General exam: Appears calm and comfortable, NAD, thin Respiratory system: No  work of breathing, symmetric chest wall expansion, occasional cough, some upper airway rales, diffuse wheezing bilaterally.  Cardiovascular system: S1 & S2 heard, RRR.  Gastrointestinal system: Abdomen is nondistended, soft and nontender.  Neuro: Cranial nerves I through XII intact, no facial asymmetry, no tongue deviation.  Tracks appropriately.  5/5 strength bilateral upper and lower extremities Extremities: Symmetric, expected ROM Skin: No rashes, lesions Psychiatry: Demonstrates appropriate judgement and insight. Mood & affect appropriate for situation.   Assessment & Plan:  Principal Problem:   Upper GI bleed Active Problems:   Atrial fibrillation (HCC)   Essential hypertension   Abdominal aortic aneurysm (AAA) (HCC)   COPD (chronic obstructive pulmonary disease) (HCC)   Tobacco dependence   Ischemic stroke (HCC)   Coronary artery disease involving native coronary artery of native heart without angina pectoris     Jaw numbness - Ongoing for 48hrs  - Patient has history of CAD and CVA - Doubt new CVA given no focal neurologic deficits, cranial nerves all intact, 5/5 bilateral upper and lower extremities - Possible stroke recrudescence in setting of hypoxia from COPD exacerbation - Given history of CAD will obtain EKG and HS Trop x 2 to rule out ACS  Pulmonary edema Heart failure with preserved EF - Vascular congestion on CXR w concurrent hypoxia - Cont IV Lasix 40 mg twice daily, hold now -- Strict  I's and O's, -6L so far - Echocardiogram: LVEF 60 to 65%, no regional wall motion abnormalities grade 1 diastolic dysfunction. -Patient reports this is a new diagnosis to him  Hyponatremia 126 this morning, 131 on repeat - Patient has put out >5L of fluid in the last 48 hours. - Will hold further Lasix for now - BMP repeat this afternoon  Acute hypoxic respiratory failure - Secondary to pulmonary edema, diuresis as above - Patient does have diffuse wheezing and cough now  as well. Suspect he may have concurrent COPD exacerbation - Flu and COVID-negative - Initiate Methylpred, monitor for improvement.  COPD, now with exacerbation - Methylpred as above - Continue home meds  Upper GI bleed - CT angio negative for acute bleed but did reveal narrowing in the colon that could represent mass versus underdistention. - GI consulted - EGD 12/26: without acute bleed or evidence of old blood.  Suspected healed Mallory-Weiss tear. - On PPI now. - Tolerating diet, no further vomiting - Continue trending CBC.  Hemoglobin stable  Paroxsymal Atrial fibrillation - On Eliquis at home - EKG NSR on arrival.  - Hold anticoagulation for now in setting of GI bleed - Currently rate controlled, resume home meds   Hypertension -- Controlled now -- As needed labetalol and hydralazine  AAA Status post repair with no evidence of endoleak on CT  CAD involving native coronary artery of native heart without angina - Baseline nonobstructive CAD - Continue home meds  History of ischemic stroke - History of multiple prior strokes in the past with chronic lower extremity weakness - Continue home meds  Tobacco dependence - Endorses quarter pack per day smoker - Cessation counseling provided - Nicotine patch for now  Chronic Back pain -Takes TID tramadol and lyrica outpt  DVT prophylaxis: SCDs   Code Status: Full Code Family Communication: none at bedside, discussed directly with pt Disposition:  Status is: Inpatient, now with IV diuresis and IV steroids. Still requiring O2.  Consultants:  GI  Procedures:  EGD 12/26  Antimicrobials:  Anti-infectives (From admission, onward)    Start     Dose/Rate Route Frequency Ordered Stop   12/10/23 1445  cefTRIAXone (ROCEPHIN) 1 g in sodium chloride 0.9 % 100 mL IVPB  Status:  Discontinued        1 g 200 mL/hr over 30 Minutes Intravenous STAT 12/10/23 1430 12/10/23 1430       Data Reviewed: I have personally reviewed  following labs and imaging studies CBC: Recent Labs  Lab 12/10/23 0950 12/10/23 1341 12/11/23 0627 12/11/23 2125 12/12/23 0826 12/15/23 0216  WBC 14.0*  --   --   --  6.5 11.2*  NEUTROABS  --   --   --   --  4.5 10.4*  HGB 12.1* 11.5* 11.2* 10.9* 10.0* 10.8*  HCT 37.8* 35.2* 33.9* 33.9* 30.3* 31.2*  MCV 96.4  --   --   --  94.7 88.6  PLT 299  --   --   --  202 265   Basic Metabolic Panel: Recent Labs  Lab 12/10/23 0950 12/11/23 0627 12/15/23 0216  NA 138 138 126*  K 4.2 3.6 3.7  CL 101 106 89*  CO2 24 24 23   GLUCOSE 120* 136* 166*  BUN 27* 27* 27*  CREATININE 1.15 1.09 1.10  CALCIUM 8.7* 8.2* 7.9*  MG  --   --  2.0  PHOS  --   --  4.0   GFR: Estimated Creatinine Clearance: 43 mL/min (by  C-G formula based on SCr of 1.1 mg/dL). Liver Function Tests: Recent Labs  Lab 12/10/23 0950 12/11/23 0627 12/15/23 0216  AST 21 47* 59*  ALT 18 48* 65*  ALKPHOS 90 96 163*  BILITOT 0.9 1.3* 1.1  PROT 7.3 6.6 6.8  ALBUMIN 4.1 3.5 3.0*   CBG: No results for input(s): "GLUCAP" in the last 168 hours.  Recent Results (from the past 240 hours)  Resp panel by RT-PCR (RSV, Flu A&B, Covid) Anterior Nasal Swab     Status: None   Collection Time: 12/14/23  2:30 PM   Specimen: Anterior Nasal Swab  Result Value Ref Range Status   SARS Coronavirus 2 by RT PCR NEGATIVE NEGATIVE Final    Comment: (NOTE) SARS-CoV-2 target nucleic acids are NOT DETECTED.  The SARS-CoV-2 RNA is generally detectable in upper respiratory specimens during the acute phase of infection. The lowest concentration of SARS-CoV-2 viral copies this assay can detect is 138 copies/mL. A negative result does not preclude SARS-Cov-2 infection and should not be used as the sole basis for treatment or other patient management decisions. A negative result may occur with  improper specimen collection/handling, submission of specimen other than nasopharyngeal swab, presence of viral mutation(s) within the areas targeted  by this assay, and inadequate number of viral copies(<138 copies/mL). A negative result must be combined with clinical observations, patient history, and epidemiological information. The expected result is Negative.  Fact Sheet for Patients:  BloggerCourse.com  Fact Sheet for Healthcare Providers:  SeriousBroker.it  This test is no t yet approved or cleared by the [place] FDA and  has been authorized for detection and/or diagnosis of SARS-CoV-2 by FDA under an Emergency Use Authorization (EUA). This EUA will remain  in effect (meaning this test can be used) for the duration of the COVID-19 declaration under Section 564(b)(1) of the Act, 21 U.S.C.section 360bbb-3(b)(1), unless the authorization is terminated  or revoked sooner.       Influenza A by PCR NEGATIVE NEGATIVE Final   Influenza B by PCR NEGATIVE NEGATIVE Final    Comment: (NOTE) The Xpert Xpress SARS-CoV-2/FLU/RSV plus assay is intended as an aid in the diagnosis of influenza from Nasopharyngeal swab specimens and should not be used as a sole basis for treatment. Nasal washings and aspirates are unacceptable for Xpert Xpress SARS-CoV-2/FLU/RSV testing.  Fact Sheet for Patients: BloggerCourse.com  Fact Sheet for Healthcare Providers: SeriousBroker.it  This test is not yet approved or cleared by the [place] FDA and has been authorized for detection and/or diagnosis of SARS-CoV-2 by FDA under an Emergency Use Authorization (EUA). This EUA will remain in effect (meaning this test can be used) for the duration of the COVID-19 declaration under Section 564(b)(1) of the Act, 21 U.S.C. section 360bbb-3(b)(1), unless the authorization is terminated or revoked.     Resp Syncytial Virus by PCR NEGATIVE NEGATIVE Final    Comment: (NOTE) Fact Sheet for Patients: BloggerCourse.com  Fact  Sheet for Healthcare Providers: SeriousBroker.it  This test is not yet approved or cleared by the [place] FDA and has been authorized for detection and/or diagnosis of SARS-CoV-2 by FDA under an Emergency Use Authorization (EUA). This EUA will remain in effect (meaning this test can be used) for the duration of the COVID-19 declaration under Section 564(b)(1) of the Act, 21 U.S.C. section 360bbb-3(b)(1), unless the authorization is terminated or revoked.  Performed at [facility], [address], [place], [place] [address]      Radiology Studies: ECHOCARDIOGRAM COMPLETE  Result Date: 12/13/2023    ECHOCARDIOGRAM REPORT   Patient Name:   Logan Vazquez Date of Exam: 12/13/2023 Medical Rec #:  [id number]      Height:       69.0 in Accession #:    [id number]     Weight:       121.0 lb Date of Birth:  January 22, 1946       BSA:          1.669 m Patient Age:    11 years       BP:           137/62 mmHg Patient Gender: M              HR:           80 bpm. Exam Location:  [facility] Procedure: 2D Echo, Cardiac Doppler and Color Doppler Indications:     Other abnormalities of the heart R00.8  History:         Patient has prior history of Echocardiogram examinations, most                  recent 03/15/2022. CAD and Angina, Stroke and COPD,                  Arrythmias:Atrial Fibrillation, Signs/Symptoms:Chest Pain and                  Syncope; Risk Factors:Hypertension and Current Smoker.  Sonographer:     Lucendia Herrlich RCS Referring Phys:  [id number] Debarah Crape Diagnosing Phys: Chilton Si MD IMPRESSIONS  1. Left ventricular ejection fraction, by estimation, is 60 to 65%. The left ventricle has normal function. The left ventricle has no regional wall motion abnormalities. Left ventricular diastolic parameters are consistent with Grade I diastolic dysfunction (impaired relaxation).  2. Right ventricular systolic function is normal. The right ventricular size is normal.  There is normal pulmonary artery systolic pressure.  3. Left atrial size was moderately dilated.  4. The mitral valve is normal in structure. Trivial mitral valve regurgitation. No evidence of mitral stenosis.  5. The aortic valve is tricuspid. Aortic valve regurgitation is moderate. No aortic stenosis is present. Aortic regurgitation PHT measures 547 msec.  6. Pulmonic valve regurgitation is moderate.  7. Aortic dilatation noted. There is mild dilatation of the aortic root, measuring 42 mm. There is mild dilatation of the ascending aorta, measuring 40 mm.  8. The inferior vena cava is normal in size with <50% respiratory variability, suggesting right atrial pressure of 8 mmHg. FINDINGS  Left Ventricle: Left ventricular ejection fraction, by estimation, is 60 to 65%. The left ventricle has normal function. The left ventricle has no regional wall motion abnormalities. The left ventricular internal cavity size was normal in size. There is  no left ventricular hypertrophy of the septal segment. Left ventricular diastolic parameters are consistent with Grade I diastolic dysfunction (impaired relaxation). Normal left ventricular filling pressure. Right Ventricle: The right ventricular size is normal. No increase in right ventricular wall thickness. Right ventricular systolic function is normal. There is normal pulmonary artery systolic pressure. The tricuspid regurgitant velocity is 2.09 m/s, and  with an assumed right atrial pressure of 8 mmHg, the estimated right ventricular systolic pressure is 25.5 mmHg. Left Atrium: Left atrial size was moderately dilated. Right Atrium: Right atrial size was normal in size. Pericardium: There is no evidence of pericardial effusion. Mitral Valve: The mitral valve is normal in structure. Trivial mitral valve regurgitation. No evidence of mitral  valve stenosis. Tricuspid Valve: The tricuspid valve is normal in structure. Tricuspid valve regurgitation is not demonstrated. No evidence of  tricuspid stenosis. Aortic Valve: The aortic valve is tricuspid. Aortic valve regurgitation is moderate. Aortic regurgitation PHT measures 547 msec. No aortic stenosis is present. Aortic valve peak gradient measures 11.1 mmHg. Pulmonic Valve: The pulmonic valve was normal in structure. Pulmonic valve regurgitation is moderate. No evidence of pulmonic stenosis. Aorta: Aortic dilatation noted. There is mild dilatation of the aortic root, measuring 42 mm. There is mild dilatation of the ascending aorta, measuring 40 mm. Venous: The inferior vena cava is normal in size with less than 50% respiratory variability, suggesting right atrial pressure of 8 mmHg. IAS/Shunts: No atrial level shunt detected by color flow Doppler.  LEFT VENTRICLE PLAX 2D LVIDd:         4.10 cm   Diastology LVIDs:         3.00 cm   LV e' medial:    10.10 cm/s LV PW:         0.70 cm   LV E/e' medial:  6.7 LV IVS:        1.19 cm   LV e' lateral:   10.70 cm/s LVOT diam:     2.30 cm   LV E/e' lateral: 6.4 LV SV:         96 LV SV Index:   58 LVOT Area:     4.15 cm  RIGHT VENTRICLE             IVC RV S prime:     20.00 cm/s  IVC diam: 2.20 cm TAPSE (M-mode): 2.2 cm LEFT ATRIUM             Index        RIGHT ATRIUM           Index LA diam:        3.40 cm 2.04 cm/m   RA Area:     10.50 cm LA Vol (A2C):   35.9 ml 21.51 ml/m  RA Volume:   16.80 ml  10.07 ml/m LA Vol (A4C):   59.8 ml 35.83 ml/m LA Biplane Vol: 50.0 ml 29.96 ml/m  AORTIC VALVE                 PULMONIC VALVE AV Area (Vmax): 3.29 cm     PR End Diast Vel: 12.67 msec AV Vmax:        166.50 cm/s AV Peak Grad:   11.1 mmHg LVOT Vmax:      132.00 cm/s LVOT Vmean:     80.900 cm/s LVOT VTI:       0.232 m AI PHT:         547 msec  AORTA Ao Root diam: 4.20 cm Ao Asc diam:  4.00 cm MITRAL VALVE                TRICUSPID VALVE MV Area (PHT): 3.85 cm     TR Peak grad:   17.5 mmHg MV Decel Time: 197 msec     TR Vmax:        209.00 cm/s MR Peak grad: 39.4 mmHg MR Vmax:      314.00 cm/s   SHUNTS MV E  velocity: 68.10 cm/s   Systemic VTI:  0.23 m MV A velocity: 108.00 cm/s  Systemic Diam: 2.30 cm MV E/A ratio:  0.63 Chilton Si MD Electronically signed by Chilton Si MD Signature Date/Time: 12/13/2023/5:33:47 PM    Final  Scheduled Meds:  atorvastatin  40 mg Oral Daily   busPIRone  15 mg Oral TID   FLUoxetine  60 mg Oral q morning   furosemide  40 mg Intravenous BID   influenza vaccine adjuvanted  0.5 mL Intramuscular Tomorrow-1000   methylPREDNISolone (SOLU-MEDROL) injection  80 mg Intravenous Daily   nicotine  7 mg Transdermal Daily   pregabalin  75 mg Oral TID   traZODone  50 mg Oral QHS   Continuous Infusions:     LOS: 5 days    Time spent:   Debarah Crape, DO [facility]  To contact the attending physician between 7A-7P please use Epic Chat. To contact the covering physician during after hours 7P-7A, please review Amion.   12/15/2023, 7:32 AM   *This document has been created with the assistance of dictation software. Please excuse typographical errors. *

## 2023-12-15 NOTE — Care Management Important Message (Signed)
Important Message  Patient Details  Name: Logan Vazquez MRN: [id number] Date of Birth: Feb 26, 1946   Important Message Given:  Yes - Medicare IM     Cristela Blue, CMA 12/15/2023, 3:13 PM

## 2023-12-15 NOTE — Progress Notes (Signed)
Physical Therapy Treatment Patient Details Name: Logan Vazquez MRN: [id number] DOB: [birth year] Today's Date: 12/15/2023   History of Present Illness 77 y.o. male with medical history significant of CAD, AAA status postrepair, chronic iron deficiency anemia, asthma, COPD, hypertension, chronic back pain presenting with upper GI bleed.  Patient reports recurrent epigastric pain over the past 24 hours.  Has had multiple episodes of bloody vomiting. Cough and shortness of breath.    PT Comments  Pt received in bed on 2L O2, placed pt on RA to assess needs. Pt dropped to 88% at rest with further desat to 84% after transferring OOB to bedside chair with RW. Pt placed back on 2L where he returned to 92% at rest in chair. +productive thick yellow sputum noted during coughing spells. Functionally he has improved, uses a RW at baseline. Mobility remains limited due to remaining chest congestion, desating during minimal activity, and general weakness. Will continue to progress acutely until medically cleared for d/c.   If plan is discharge home, recommend the following: A little help with walking and/or transfers;A little help with bathing/dressing/bathroom;Assistance with feeding;Assist for transportation   Can travel by private vehicle        Equipment Recommendations  Other (comment) (Pt has a RW at home, may benefit from a Rollator with new O2 needs)    Recommendations for Other Services       Precautions / Restrictions Precautions Precautions: Fall Restrictions Weight Bearing Restrictions Per Provider Order: No     Mobility  Bed Mobility Overal bed mobility: Needs Assistance Bed Mobility: Supine to Sit, Sit to Supine     Supine to sit: Supervision, HOB elevated, Used rails     General bed mobility comments: use of bed rails, no physical assist provided, increased time to complete    Transfers Overall transfer level: Needs assistance Equipment used: Rolling walker (2  wheels) Transfers: Sit to/from Stand Sit to Stand: Supervision           General transfer comment: Supervision for safety with support of RW    Ambulation/Gait Ambulation/Gait assistance: Contact guard assist Gait Distance (Feet): 15 Feet Assistive device: Rolling walker (2 wheels) Gait Pattern/deviations: Step-to pattern, Step-through pattern, Decreased step length - right, Decreased step length - left, Trunk flexed Gait velocity: decr     General Gait Details: forward flexed posture (reports this as baseline) with slow but consistent cadence.    No LOBs, reports feeling close to his baseline.   Stairs             Wheelchair Mobility     Tilt Bed    Modified Rankin (Stroke Patients Only)       Balance Overall balance assessment: Needs assistance Sitting-balance support: No upper extremity supported Sitting balance-Leahy Scale: Good     Standing balance support: During functional activity, Reliant on assistive device for balance, Bilateral upper extremity supported Standing balance-Leahy Scale: Fair Standing balance comment: Reliant on walker for balance.                            Cognition Arousal: Alert Behavior During Therapy: WFL for tasks assessed/performed Overall Cognitive Status: No family/caregiver present to determine baseline cognitive functioning                                 General Comments: Unable to recall place where he was living or nurse who  cared for him        Exercises General Exercises - Lower Extremity Ankle Circles/Pumps: AROM, Both, 10 reps, Seated Long Arc Quad: AROM, Both, 10 reps, Seated Other Exercises Other Exercises: Pt educated in role PT, current LOF and benefits of continued PT upon d/c.    General Comments General comments (skin integrity, edema, etc.): Pt on 2L O2 throughout session with SpO2 dropping to 88% during activity      Pertinent Vitals/Pain Pain Assessment Pain  Assessment: No/denies pain    Home Living                          Prior Function            PT Goals (current goals can now be found in the care plan section) Acute Rehab PT Goals Patient Stated Goal: go home ASAP (speaks of leaving today AMA) Progress towards PT goals: Progressing toward goals    Frequency    Min 1X/week      PT Plan      Co-evaluation              AM-PAC PT "6 Clicks" Mobility   Outcome Measure  Help needed turning from your back to your side while in a flat bed without using bedrails?: None Help needed moving from lying on your back to sitting on the side of a flat bed without using bedrails?: None Help needed moving to and from a bed to a chair (including a wheelchair)?: None Help needed standing up from a chair using your arms (e.g., wheelchair or bedside chair)?: A Little Help needed to walk in hospital room?: A Little Help needed climbing 3-5 steps with a railing? : A Little 6 Click Score: 21    End of Session Equipment Utilized During Treatment: Gait belt;Oxygen (2L O2) Activity Tolerance: Patient limited by fatigue;Patient tolerated treatment well Patient left: in chair;with call bell/phone within reach;with chair alarm set Nurse Communication: Mobility status PT Visit Diagnosis: Muscle weakness (generalized) (M62.81);Difficulty in walking, not elsewhere classified (R26.2)     Time: 1040-1109 PT Time Calculation (min) (ACUTE ONLY): 29 min  Charges:    $Gait Training: 8-22 mins $Therapeutic Exercise: 8-22 mins PT General Charges $$ ACUTE PT VISIT: 1 Visit                    Zadie Cleverly, PTA  Jannet Askew 12/15/2023, 12:58 PM

## 2023-12-15 NOTE — TOC Progression Note (Signed)
Transition of Care [facility]) - Progression Note    Patient Details  Name: Logan Vazquez MRN: [id number] Date of Birth: December 12, 1946  [facility] CM/SW Contact  Garret Reddish, RN Phone Number: 12/15/2023, 12:26 PM  Clinical Narrative:    Chart reviewed. Noted that patient is from [facility] facility.  Left voicemail for facility to return call.  TOC will continue to follow for discharge planning.          Expected Discharge Plan and Services                                               Social Determinants of Health (SDOH) Interventions SDOH Screenings   Food Insecurity: No Food Insecurity (12/11/2023)  Housing: Unknown (12/11/2023)  Transportation Needs: No Transportation Needs (12/11/2023)  Utilities: Not At Risk (12/11/2023)  Depression (PHQ2-9): Low Risk  (12/04/2021)  Tobacco Use: High Risk (12/11/2023)    Readmission Risk Interventions     No data to display

## 2023-12-16 DIAGNOSIS — K922 Gastrointestinal hemorrhage, unspecified: Secondary | ICD-10-CM | POA: Diagnosis not present

## 2023-12-16 LAB — MAGNESIUM: Magnesium: 2.4 mg/dL (ref 1.7–2.4)

## 2023-12-16 LAB — CBC WITH DIFFERENTIAL/PLATELET
Abs Immature Granulocytes: 0.52 10*3/uL — ABNORMAL HIGH (ref 0.00–0.07)
Basophils Absolute: 0 10*3/uL (ref 0.0–0.1)
Basophils Relative: 0 %
Eosinophils Absolute: 0 10*3/uL (ref 0.0–0.5)
Eosinophils Relative: 0 %
HCT: 32.4 % — ABNORMAL LOW (ref 39.0–52.0)
Hemoglobin: 10.8 g/dL — ABNORMAL LOW (ref 13.0–17.0)
Immature Granulocytes: 3 %
Lymphocytes Relative: 7 %
Lymphs Abs: 1.1 10*3/uL (ref 0.7–4.0)
MCH: 30.7 pg (ref 26.0–34.0)
MCHC: 33.3 g/dL (ref 30.0–36.0)
MCV: 92 fL (ref 80.0–100.0)
Monocytes Absolute: 1.6 10*3/uL — ABNORMAL HIGH (ref 0.1–1.0)
Monocytes Relative: 10 %
Neutro Abs: 13.1 10*3/uL — ABNORMAL HIGH (ref 1.7–7.7)
Neutrophils Relative %: 80 %
Platelets: 296 10*3/uL (ref 150–400)
RBC: 3.52 MIL/uL — ABNORMAL LOW (ref 4.22–5.81)
RDW: 16.9 % — ABNORMAL HIGH (ref 11.5–15.5)
WBC: 16.4 10*3/uL — ABNORMAL HIGH (ref 4.0–10.5)
nRBC: 0 % (ref 0.0–0.2)

## 2023-12-16 LAB — COMPREHENSIVE METABOLIC PANEL
ALT: 55 U/L — ABNORMAL HIGH (ref 0–44)
AST: 40 U/L (ref 15–41)
Albumin: 2.9 g/dL — ABNORMAL LOW (ref 3.5–5.0)
Alkaline Phosphatase: 137 U/L — ABNORMAL HIGH (ref 38–126)
Anion gap: 9 (ref 5–15)
BUN: 34 mg/dL — ABNORMAL HIGH (ref 8–23)
CO2: 27 mmol/L (ref 22–32)
Calcium: 8.1 mg/dL — ABNORMAL LOW (ref 8.9–10.3)
Chloride: 95 mmol/L — ABNORMAL LOW (ref 98–111)
Creatinine, Ser: 1.04 mg/dL (ref 0.61–1.24)
GFR, Estimated: >60 mL/min (ref >60)
Glucose, Bld: 124 mg/dL — ABNORMAL HIGH (ref 70–99)
Potassium: 3.3 mmol/L — ABNORMAL LOW (ref 3.5–5.1)
Sodium: 131 mmol/L — ABNORMAL LOW (ref 135–145)
Total Bilirubin: 0.6 mg/dL (ref 0.0–1.2)
Total Protein: 6.2 g/dL — ABNORMAL LOW (ref 6.5–8.1)

## 2023-12-16 LAB — PHOSPHORUS: Phosphorus: 3.2 mg/dL (ref 2.5–4.6)

## 2023-12-16 MED ORDER — HYDROCORTISONE ACETATE 25 MG RE SUPP
25.0000 mg | Freq: Two times a day (BID) | RECTAL | Status: DC
  Administered 2023-12-16 – 2023-12-17 (×2): 25 mg via RECTAL
  Filled 2023-12-16 (×15): qty 1

## 2023-12-16 MED ORDER — CLOPIDOGREL BISULFATE 75 MG PO TABS
75.0000 mg | ORAL_TABLET | Freq: Every day | ORAL | Status: DC
  Administered 2023-12-16 – 2023-12-23 (×8): 75 mg via ORAL
  Filled 2023-12-16 (×8): qty 1

## 2023-12-16 MED ORDER — AMLODIPINE BESYLATE 10 MG PO TABS
10.0000 mg | ORAL_TABLET | Freq: Every day | ORAL | Status: DC
  Administered 2023-12-16 – 2023-12-18 (×3): 10 mg via ORAL
  Filled 2023-12-16 (×3): qty 1

## 2023-12-16 MED ORDER — BUDESONIDE 0.25 MG/2ML IN SUSP
0.2500 mg | Freq: Two times a day (BID) | RESPIRATORY_TRACT | Status: DC
  Administered 2023-12-16 – 2023-12-23 (×13): 0.25 mg via RESPIRATORY_TRACT
  Filled 2023-12-16 (×13): qty 2

## 2023-12-16 MED ORDER — PREDNISONE 20 MG PO TABS
40.0000 mg | ORAL_TABLET | Freq: Every day | ORAL | Status: AC
  Administered 2023-12-17 – 2023-12-21 (×5): 40 mg via ORAL
  Filled 2023-12-16 (×5): qty 2

## 2023-12-16 MED ORDER — IPRATROPIUM-ALBUTEROL 0.5-2.5 (3) MG/3ML IN SOLN
3.0000 mL | Freq: Three times a day (TID) | RESPIRATORY_TRACT | Status: DC
  Administered 2023-12-16: 3 mL via RESPIRATORY_TRACT
  Filled 2023-12-16: qty 3

## 2023-12-16 MED ORDER — TIOTROPIUM BROMIDE MONOHYDRATE 18 MCG IN CAPS
1.0000 | ORAL_CAPSULE | Freq: Every day | RESPIRATORY_TRACT | Status: DC
  Administered 2023-12-16 – 2023-12-17 (×2): 18 ug via RESPIRATORY_TRACT
  Filled 2023-12-16: qty 1

## 2023-12-16 MED ORDER — METOPROLOL TARTRATE 25 MG PO TABS
12.5000 mg | ORAL_TABLET | Freq: Every day | ORAL | Status: DC
  Administered 2023-12-16 – 2023-12-21 (×4): 12.5 mg via ORAL
  Filled 2023-12-16 (×5): qty 1

## 2023-12-16 MED ORDER — PANTOPRAZOLE SODIUM 40 MG PO TBEC
40.0000 mg | DELAYED_RELEASE_TABLET | Freq: Every day | ORAL | Status: DC
  Administered 2023-12-16 – 2023-12-23 (×8): 40 mg via ORAL
  Filled 2023-12-16 (×8): qty 1

## 2023-12-16 MED ORDER — AZITHROMYCIN 500 MG PO TABS
500.0000 mg | ORAL_TABLET | Freq: Every day | ORAL | Status: DC
  Administered 2023-12-16 – 2023-12-18 (×3): 500 mg via ORAL
  Filled 2023-12-16 (×3): qty 1

## 2023-12-16 MED ORDER — LISINOPRIL 20 MG PO TABS
40.0000 mg | ORAL_TABLET | Freq: Every day | ORAL | Status: DC
  Administered 2023-12-16 – 2023-12-20 (×4): 40 mg via ORAL
  Filled 2023-12-16 (×4): qty 2

## 2023-12-16 MED ORDER — MELATONIN 5 MG PO TABS
5.0000 mg | ORAL_TABLET | Freq: Every day | ORAL | Status: DC
  Administered 2023-12-16 – 2023-12-22 (×7): 5 mg via ORAL
  Filled 2023-12-16 (×7): qty 1

## 2023-12-16 NOTE — Progress Notes (Signed)
SATURATION QUALIFICATIONS: (This note is used to comply with regulatory documentation for home oxygen)  Patient Saturations on Room Air at Rest = 94%  Patient Saturations on Room Air while Ambulating = 86%  Patient Saturations on 2 Liters of oxygen while Ambulating = 92%  Please briefly explain why patient needs home oxygen: 

## 2023-12-16 NOTE — TOC Progression Note (Signed)
 Transition of Care [facility]) - Progression Note    Patient Details  Name: Logan Vazquez MRN: [id number] Date of Birth: 06-05-1946  Transition of Care [facility]) CM/SW Contact  Beryle Bagsby A Oral Remache, RN Phone Number: 12/16/2023, 3:27 PM  Clinical Narrative:    Chart reviewed.  I have spoken with Tammy, Admission Director at [facility].  Tammy informs me that patient was able to get around with a walker with a seat at the facility.  I have informed Tammy that patient will require o2 at the facility.  Tammy reports that patient will be able have home 02 at the facility.  Tammy reports that patient is active with [facility] for home health PT/OT.  Tammy reports that the facility will be able to pick patient up on Thursday.  Tammy reports that her number is [phone].  TOC will continue to follow for discharge planning.          Expected Discharge Plan and Services                                               Social Determinants of Health (SDOH) Interventions SDOH Screenings   Food Insecurity: No Food Insecurity (12/11/2023)  Housing: Unknown (12/11/2023)  Transportation Needs: No Transportation Needs (12/11/2023)  Utilities: Not At Risk (12/11/2023)  Depression (PHQ2-9): Low Risk  (12/04/2021)  Tobacco Use: High Risk (12/11/2023)    Readmission Risk Interventions     No data to display

## 2023-12-16 NOTE — Hospital Course (Addendum)
 77yo with h/o CAD, AAA s/p repair, COPD, HTN, and chronic back pain who presented with UGI bleeding.  CTA negative.  EGD on 12/26 negative.  Developed SOB with hypoxia; given Lasix  and treated for COPD exacerbation.  Continues to have coarse cough and feel terrible.

## 2023-12-16 NOTE — Plan of Care (Signed)

## 2023-12-16 NOTE — Plan of Care (Signed)

## 2023-12-16 NOTE — TOC Progression Note (Signed)
 Transition of Care [facility]) - Progression Note    Patient Details  Name: Logan Vazquez MRN: [id number] Date of Birth: 1946/09/17  Transition of Care [facility]) CM/SW Contact  Bryten Maher A Judithe Keetch, RN Phone Number: 12/16/2023, 3:43 PM  Clinical Narrative:    I have spoken with Aertiva with [facility].  Aertiva informs me that patient is active with [facility] for Home Health PT, OT, and RN at the facility.  I have informed Aertiva that patient will be a tentative discharge for Thursday.    I have spoken with Mitch with [facility].  I have informed him that patient will need home 02 for discharge. I have informed Mitch with [facility] that patient will be a tentative discharge for Thursday.  I have asked Mitch to provide a portable tank at bedside for discharge.    TOC will continue to follow for discharge planning.          Expected Discharge Plan and Services                                               Social Determinants of Health (SDOH) Interventions SDOH Screenings   Food Insecurity: No Food Insecurity (12/11/2023)  Housing: Unknown (12/11/2023)  Transportation Needs: No Transportation Needs (12/11/2023)  Utilities: Not At Risk (12/11/2023)  Depression (PHQ2-9): Low Risk  (12/04/2021)  Tobacco Use: High Risk (12/11/2023)    Readmission Risk Interventions     No data to display

## 2023-12-16 NOTE — Progress Notes (Signed)
 Progress Note   Patient: Logan Vazquez FMW:[id number] DOB: [birth year] DOA: 12/10/2023     6 DOS: the patient was seen and examined on 12/16/2023   Brief hospital course: 77yo with h/o CAD, AAA s/p repair, COPD, HTN, and chronic back pain who presented with UGI bleeding.  CTA negative.  EGD on 12/26 negative.  Developed SOB with hypoxia; given Lasix  and treated for COPD exacerbation.  Assessment and Plan:  Upper GI bleed CT angio negative for acute bleed but did reveal narrowing in the colon that could represent mass versus underdistention GI consulted EGD 12/26 without acute bleed or evidence of old blood.  Suspected healed Mallory-Weiss tear On PPI now Tolerating diet, no further vomiting Hemoglobin stable   Acute hypoxic respiratory failure Secondary to pulmonary edema + COPD Flu and COVID-negative  COPD, now with exacerbation Requiring [place] O2; will do ambulatory pulse ox to see if he needs home O2 Methylprednisolone  -> prednisone  x 5 days Azithromycin  x 5 days Continue Arnuity, Spiriva , Albtuerol  Jaw numbness Ongoing for 48hrs, no complaint today Patient has history of CAD and CVA Doubt new CVA given no focal neurologic deficits, cranial nerves all intact, 5/5 bilateral upper and lower extremities Possible stroke recrudescence in setting of hypoxia from COPD exacerbation Negative troponins   Pulmonary edema Heart failure with preserved EF Increased interstitial edema on 12/27 CXR Treated with Lasix  40 mg twice daily Echocardiogram: LVEF 60 to 65%, no regional wall motion abnormalities grade 1 diastolic dysfunction. Appears to be compensated at this time   Hyponatremia 126 this morning, 131 on repeat Likely related to diuresis No longer getting diuresed Will trend   Paroxsymal Atrial fibrillation On Eliquis  at home EKG NSR on arrival.  Hold anticoagulation for now in setting of GI bleed Currently rate controlled, continue metoprolol    Hypertension Resume home  medications - amlodipine , lisinopril , lopressor    AAA Status post repair with no evidence of endoleak on CT   CAD involving native coronary artery of native heart without angina Baseline nonobstructive CAD Continue Plavix    History of ischemic stroke History of multiple prior strokes in the past with chronic lower extremity weakness Continue statin, Plavix    Tobacco dependence Endorses quarter pack per day smoker Cessation counseling provided Nicotine  patch for now   Chronic Back pain Continue TID tramadol  and pregabalin , Baclofen  Mood d/o Continue fluoxetine , melatonin, trazodone      Consultants: GI PT OT  Procedures: EGD 12/26 Echocardiogram 12/28  Antibiotics: None   30 Day Unplanned Readmission Risk Score    Flowsheet Row ED to Hosp-Admission (Current) from [facility]10/2023 in [facility] 1C MEDICAL TELEMETRY  30 Day Unplanned Readmission Risk Score (%) 24.41 Filed at 12/16/2023 0401       This score is the patient's risk of an unplanned readmission within 30 days of being discharged (0 -100%). The score is based on dignosis, age, lab data, medications, orders, and past utilization.   Low:  0-14.9   Medium: 15-21.9   High: 22-29.9   Extreme: 30 and above           Subjective: Still feeling rough.  Using IS.  No specific complaints but does not feel well enough to go home.   Objective: Vitals:   12/16/23 0141 12/16/23 0521  BP:  135/68  Pulse:  69  Resp:  18  Temp:  98.1 F (36.7 C)  SpO2: 92% 92%    Intake/Output Summary (Last 24 hours) at 12/16/2023 0749 Last data filed at 12/16/2023  9384 Gross per 24 hour  Intake 100 ml  Output 800 ml  Net -700 ml   Filed Weights   12/11/23 1124 12/11/23 2100  Weight: 54.9 kg 54.9 kg    Exam:  General:  Appears calm and comfortable and is in NAD, chronically ill-appearing Eyes:   EOMI, normal lids, iris ENT:  grossly normal hearing, lips & tongue, mmm Neck:  no LAD, masses or  thyromegaly Cardiovascular:  RRR, no m/r/g. No LE edema.  Respiratory:   Diffuse wheezing.  Normal respiratory effort on [place] O2. Abdomen:  soft, NT, ND Skin:  no rash or induration seen on limited exam Musculoskeletal:  grossly normal tone BUE/BLE, good ROM, no bony abnormality Psychiatric:  grossly normal mood and affect, speech fluent and appropriate, AOx3 Neurologic:  CN 2-12 grossly intact, moves all extremities in coordinated fashion  Data Reviewed: I have reviewed the patient's lab results since admission.  Pertinent labs for today include:   Na++ 131 K+ 3.3 Glucose 124 BUN 34/Creatinine 1.04/GFR >60 Albumin 2.9 AST 40/ALT 55 HS troponin 11, 13 WBC 16.4 Hgb 10.8     Family Communication: None present; lives in a group home and doesn't want Orlean to visit him  Disposition: Status is: Inpatient Remains inpatient appropriate because: ongoing evaluation and treatment     Time spent: 50 minutes  Unresulted Labs (From admission, onward)     Start     Ordered   12/15/23 0500  Phosphorus  Daily,   R      12/14/23 1553   12/15/23 0500  Comprehensive metabolic panel  Daily,   R      12/14/23 1553   12/15/23 0500  CBC with Differential/Platelet  Daily,   R      12/14/23 1553   12/15/23 0500  Magnesium   Daily,   R      12/14/23 1553             Author: Delon Herald, MD 12/16/2023 7:49 AM  For on call review [email or web address].

## 2023-12-17 DIAGNOSIS — K922 Gastrointestinal hemorrhage, unspecified: Secondary | ICD-10-CM | POA: Diagnosis not present

## 2023-12-17 LAB — COMPREHENSIVE METABOLIC PANEL
ALT: 49 U/L — ABNORMAL HIGH (ref 0–44)
AST: 37 U/L (ref 15–41)
Albumin: 2.8 g/dL — ABNORMAL LOW (ref 3.5–5.0)
Alkaline Phosphatase: 124 U/L (ref 38–126)
Anion gap: 12 (ref 5–15)
BUN: 41 mg/dL — ABNORMAL HIGH (ref 8–23)
CO2: 26 mmol/L (ref 22–32)
Calcium: 8 mg/dL — ABNORMAL LOW (ref 8.9–10.3)
Chloride: 95 mmol/L — ABNORMAL LOW (ref 98–111)
Creatinine, Ser: 1.18 mg/dL (ref 0.61–1.24)
GFR, Estimated: >60 mL/min (ref >60)
Glucose, Bld: 130 mg/dL — ABNORMAL HIGH (ref 70–99)
Potassium: 3.7 mmol/L (ref 3.5–5.1)
Sodium: 133 mmol/L — ABNORMAL LOW (ref 135–145)
Total Bilirubin: 0.5 mg/dL (ref 0.0–1.2)
Total Protein: 6.1 g/dL — ABNORMAL LOW (ref 6.5–8.1)

## 2023-12-17 LAB — CBC WITH DIFFERENTIAL/PLATELET
Abs Immature Granulocytes: 0.27 10*3/uL — ABNORMAL HIGH (ref 0.00–0.07)
Basophils Absolute: 0 10*3/uL (ref 0.0–0.1)
Basophils Relative: 0 %
Eosinophils Absolute: 0 10*3/uL (ref 0.0–0.5)
Eosinophils Relative: 0 %
HCT: 31 % — ABNORMAL LOW (ref 39.0–52.0)
Hemoglobin: 10.5 g/dL — ABNORMAL LOW (ref 13.0–17.0)
Immature Granulocytes: 2 %
Lymphocytes Relative: 8 %
Lymphs Abs: 1.2 10*3/uL (ref 0.7–4.0)
MCH: 30.6 pg (ref 26.0–34.0)
MCHC: 33.9 g/dL (ref 30.0–36.0)
MCV: 90.4 fL (ref 80.0–100.0)
Monocytes Absolute: 1.2 10*3/uL — ABNORMAL HIGH (ref 0.1–1.0)
Monocytes Relative: 7 %
Neutro Abs: 13.7 10*3/uL — ABNORMAL HIGH (ref 1.7–7.7)
Neutrophils Relative %: 83 %
Platelets: 335 10*3/uL (ref 150–400)
RBC: 3.43 MIL/uL — ABNORMAL LOW (ref 4.22–5.81)
RDW: 17.2 % — ABNORMAL HIGH (ref 11.5–15.5)
WBC: 16.4 10*3/uL — ABNORMAL HIGH (ref 4.0–10.5)
nRBC: 0 % (ref 0.0–0.2)

## 2023-12-17 LAB — MAGNESIUM: Magnesium: 2.5 mg/dL — ABNORMAL HIGH (ref 1.7–2.4)

## 2023-12-17 LAB — PHOSPHORUS: Phosphorus: 3.5 mg/dL (ref 2.5–4.6)

## 2023-12-17 MED ORDER — ALBUTEROL SULFATE (2.5 MG/3ML) 0.083% IN NEBU: 2.5000 mg | INHALATION_SOLUTION | RESPIRATORY_TRACT | Status: DC | PRN

## 2023-12-17 MED ORDER — IPRATROPIUM-ALBUTEROL 0.5-2.5 (3) MG/3ML IN SOLN
3.0000 mL | Freq: Three times a day (TID) | RESPIRATORY_TRACT | Status: DC
  Administered 2023-12-17 – 2023-12-23 (×16): 3 mL via RESPIRATORY_TRACT
  Filled 2023-12-17 (×16): qty 3

## 2023-12-17 MED ORDER — IPRATROPIUM-ALBUTEROL 0.5-2.5 (3) MG/3ML IN SOLN: 3.0000 mL | Freq: Four times a day (QID) | RESPIRATORY_TRACT | Status: DC

## 2023-12-17 MED ORDER — BENZONATATE 100 MG PO CAPS
100.0000 mg | ORAL_CAPSULE | Freq: Once | ORAL | Status: AC
  Administered 2023-12-18: 100 mg via ORAL
  Filled 2023-12-17: qty 1

## 2023-12-17 NOTE — Plan of Care (Signed)

## 2023-12-17 NOTE — Progress Notes (Signed)
 Progress Note   Patient: Logan Vazquez FMW:[id number] DOB: 07-27-1946 DOA: 12/10/2023     7 DOS: the patient was seen and examined on 12/17/2023   Brief hospital course: 77yo with h/o CAD, AAA s/p repair, COPD, HTN, and chronic back pain who presented with UGI bleeding.  CTA negative.  EGD on 12/26 negative.  Developed SOB with hypoxia; given Lasix  and treated for COPD exacerbation.  Assessment and Plan:  Upper GI bleed CT angio negative for acute bleed but did reveal narrowing in the colon that could represent mass versus underdistention GI consulted EGD 12/26 without acute bleed or evidence of old blood.  Suspected healed Mallory-Weiss tear On PPI now Tolerating diet, no further vomiting Hemoglobin stable   Acute hypoxic respiratory failure Secondary to pulmonary edema + COPD Flu and COVID-negative   COPD, now with exacerbation Requiring [place] O2; ambulatory pulse ox to 86%, he needs home O2 and this has been ordered Methylprednisolone  -> prednisone  x 5 days Azithromycin  x 5 days Continue Arnuity, Mucinex  Change Spiriva  to Duonebs q6h + q2h prn albuterol    Chronic diastolic CHF Increased interstitial edema on 12/27 CXR Treated with Lasix  40 mg twice daily Echocardiogram: LVEF 60 to 65%, no regional wall motion abnormalities grade 1 diastolic dysfunction. Appears to be compensated at this time   Hyponatremia Chronic and stable during hospitalization Recommend outpatient f/u   Paroxsymal Atrial fibrillation On Eliquis  at home EKG NSR on arrival.  Hold anticoagulation for now in setting of GI bleed Currently rate controlled, continue metoprolol    Hypertension Resume home medications - amlodipine , lisinopril , lopressor    AAA Status post repair with no evidence of endoleak on CT   CAD involving native coronary artery of native heart without angina Baseline nonobstructive CAD Continue Plavix    History of ischemic stroke History of multiple prior strokes in the past  with chronic lower extremity weakness Continue statin, Plavix    Tobacco dependence Endorses quarter pack per day smoker Cessation counseling provided Nicotine  patch for now   Chronic Back pain Continue TID tramadol  and pregabalin , Baclofen   Mood d/o Continue fluoxetine , melatonin, trazodone          Consultants: GI PT OT   Procedures: EGD 12/26 Echocardiogram 12/28   Antibiotics: None    30 Day Unplanned Readmission Risk Score    Flowsheet Row ED to Hosp-Admission (Current) from [facility] 1C MEDICAL TELEMETRY  30 Day Unplanned Readmission Risk Score (%) 25.39 Filed at 12/17/2023 0400       This score is the patient's risk of an unplanned readmission within 30 days of being discharged (0 -100%). The score is based on dignosis, age, lab data, medications, orders, and past utilization.   Low:  0-14.9   Medium: 15-21.9   High: 22-29.9   Extreme: 30 and above           Subjective: Still with significant cough.  Understands that the plan is for home tomorrow with [facility] and home O2.   Objective: Vitals:   12/17/23 0727 12/17/23 0814  BP:  114/67  Pulse:  (!) 51  Resp:  16  Temp:  (!) 97.5 F (36.4 C)  SpO2: 96% 92%    Intake/Output Summary (Last 24 hours) at 12/17/2023 1409 Last data filed at 12/17/2023 0439 Gross per 24 hour  Intake 340 ml  Output 900 ml  Net -560 ml   Filed Weights   12/11/23 1124 12/11/23 2100  Weight: 54.9 kg 54.9 kg    Exam:  General:  Appears calm and comfortable and is in NAD, chronically ill-appearing Eyes:   EOMI, normal lids, iris ENT:  grossly normal hearing, lips & tongue, mmm Neck:  no LAD, masses or thyromegaly Cardiovascular:  RRR, no m/r/g. No LE edema.  Respiratory:   Diffuse wheezing.  Normal respiratory effort on [place] O2. Abdomen:  soft, NT, ND Skin:  no rash or induration seen on limited exam Musculoskeletal:  grossly normal tone BUE/BLE, good ROM, no bony  abnormality Psychiatric:  grossly normal mood and affect, speech fluent and appropriate, AOx3 Neurologic:  CN 2-12 grossly intact, moves all extremities in coordinated fashion  Data Reviewed: I have reviewed the patient's lab results since admission.  Pertinent labs for today include:   Glucose 130 BUN 41 (27 on admission)/Creatinine 1.18/GFR >60 Albumin 2.8 AST 37/ALT 49 WBC 16.4 Hgb 10.5 - stable    Family Communication: None present  Disposition: Status is: Inpatient Remains inpatient appropriate because: ongoing management     Time spent: 50 minutes  Unresulted Labs (From admission, onward)     Start     Ordered   12/15/23 0500  Phosphorus  Daily,   R      12/14/23 1553   12/15/23 0500  Comprehensive metabolic panel  Daily,   R      12/14/23 1553   12/15/23 0500  CBC with Differential/Platelet  Daily,   R      12/14/23 1553   12/15/23 0500  Magnesium   Daily,   R      12/14/23 1553             Author: Delon Herald, MD 12/17/2023 2:09 PM  For on call review [email or web address].

## 2023-12-18 ENCOUNTER — Inpatient Hospital Stay: Payer: Medicare HMO

## 2023-12-18 DIAGNOSIS — K922 Gastrointestinal hemorrhage, unspecified: Secondary | ICD-10-CM | POA: Diagnosis not present

## 2023-12-18 LAB — CBC WITH DIFFERENTIAL/PLATELET
Abs Immature Granulocytes: 0.48 10*3/uL — ABNORMAL HIGH (ref 0.00–0.07)
Basophils Absolute: 0.1 10*3/uL (ref 0.0–0.1)
Basophils Relative: 0 %
Eosinophils Absolute: 0 10*3/uL (ref 0.0–0.5)
Eosinophils Relative: 0 %
HCT: 35.1 % — ABNORMAL LOW (ref 39.0–52.0)
Hemoglobin: 11.7 g/dL — ABNORMAL LOW (ref 13.0–17.0)
Immature Granulocytes: 2 %
Lymphocytes Relative: 10 %
Lymphs Abs: 1.9 10*3/uL (ref 0.7–4.0)
MCH: 31 pg (ref 26.0–34.0)
MCHC: 33.3 g/dL (ref 30.0–36.0)
MCV: 93.1 fL (ref 80.0–100.0)
Monocytes Absolute: 1.3 10*3/uL — ABNORMAL HIGH (ref 0.1–1.0)
Monocytes Relative: 6 %
Neutro Abs: 16.3 10*3/uL — ABNORMAL HIGH (ref 1.7–7.7)
Neutrophils Relative %: 82 %
Platelets: 407 10*3/uL — ABNORMAL HIGH (ref 150–400)
RBC: 3.77 MIL/uL — ABNORMAL LOW (ref 4.22–5.81)
RDW: 17.2 % — ABNORMAL HIGH (ref 11.5–15.5)
WBC: 20.1 10*3/uL — ABNORMAL HIGH (ref 4.0–10.5)
nRBC: 0 % (ref 0.0–0.2)

## 2023-12-18 LAB — COMPREHENSIVE METABOLIC PANEL
ALT: 44 U/L (ref 0–44)
AST: 32 U/L (ref 15–41)
Albumin: 2.8 g/dL — ABNORMAL LOW (ref 3.5–5.0)
Alkaline Phosphatase: 113 U/L (ref 38–126)
Anion gap: 11 (ref 5–15)
BUN: 44 mg/dL — ABNORMAL HIGH (ref 8–23)
CO2: 27 mmol/L (ref 22–32)
Calcium: 7.9 mg/dL — ABNORMAL LOW (ref 8.9–10.3)
Chloride: 93 mmol/L — ABNORMAL LOW (ref 98–111)
Creatinine, Ser: 1.34 mg/dL — ABNORMAL HIGH (ref 0.61–1.24)
GFR, Estimated: 54 mL/min — ABNORMAL LOW (ref >60)
Glucose, Bld: 105 mg/dL — ABNORMAL HIGH (ref 70–99)
Potassium: 3.5 mmol/L (ref 3.5–5.1)
Sodium: 131 mmol/L — ABNORMAL LOW (ref 135–145)
Total Bilirubin: 0.8 mg/dL (ref 0.0–1.2)
Total Protein: 6.3 g/dL — ABNORMAL LOW (ref 6.5–8.1)

## 2023-12-18 LAB — PHOSPHORUS: Phosphorus: 4 mg/dL (ref 2.5–4.6)

## 2023-12-18 LAB — MAGNESIUM: Magnesium: 2.6 mg/dL — ABNORMAL HIGH (ref 1.7–2.4)

## 2023-12-18 MED ORDER — AMOXICILLIN-POT CLAVULANATE 875-125 MG PO TABS
1.0000 | ORAL_TABLET | Freq: Two times a day (BID) | ORAL | Status: DC
Start: 2023-12-18 — End: 2023-12-23
  Administered 2023-12-18 – 2023-12-23 (×10): 1 via ORAL
  Filled 2023-12-18 (×10): qty 1

## 2023-12-18 MED ORDER — GUAIFENESIN ER 600 MG PO TB12
600.0000 mg | ORAL_TABLET | Freq: Two times a day (BID) | ORAL | Status: DC
  Administered 2023-12-18 – 2023-12-23 (×10): 600 mg via ORAL
  Filled 2023-12-18 (×10): qty 1

## 2023-12-18 MED ORDER — HYDROCODONE BIT-HOMATROP MBR 5-1.5 MG/5ML PO SOLN
5.0000 mL | ORAL | Status: DC | PRN
  Administered 2023-12-18 – 2023-12-23 (×5): 5 mL via ORAL
  Filled 2023-12-18 (×7): qty 5

## 2023-12-18 NOTE — Progress Notes (Signed)
 Progress Note   Patient: Logan Vazquez FMW:[id number] DOB: 1946-05-16 DOA: 12/10/2023     8 DOS: the patient was seen and examined on 12/18/2023   Brief hospital course: 78yo with h/o CAD, AAA s/p repair, COPD, HTN, and chronic back pain who presented with UGI bleeding.  CTA negative.  EGD on 12/26 negative.  Developed SOB with hypoxia; given Lasix  and treated for COPD exacerbation.  Assessment and Plan:  Upper GI bleed CT angio negative for acute bleed but did reveal narrowing in the colon that could represent mass versus underdistention GI consulted EGD 12/26 without acute bleed or evidence of old blood.  Suspected healed Mallory-Weiss tear On PPI now Tolerating diet, no further vomiting BUN is uptrending but hemoglobin remains stable   Acute hypoxic respiratory failure Secondary to pulmonary edema + COPD Flu and COVID-negative   COPD, now with exacerbation Requiring [place] O2; ambulatory pulse ox to 86%, he needs home O2 and this has been ordered Methylprednisolone  -> prednisone  x 5 days Azithromycin  -> Augmentin  since he is still having ongoing coarse cough Repeat CXR Continue Arnuity, Mucinex  Will add Hycodan prn Change Spiriva  to Duonebs q6h + q2h prn albuterol    Chronic diastolic CHF Increased interstitial edema on 12/27 CXR Treated with Lasix  40 mg twice daily Echocardiogram: LVEF 60 to 65%, no regional wall motion abnormalities grade 1 diastolic dysfunction. Appears to be compensated at this time   Hyponatremia Chronic and stable during hospitalization Recommend outpatient f/u   Paroxsymal Atrial fibrillation On Eliquis  at home EKG NSR on arrival.  Hold anticoagulation for now in setting of GI bleed Currently rate controlled, continue metoprolol    Hypertension Resume home medications - amlodipine , lisinopril , lopressor    AAA Status post repair with no evidence of endoleak on CT   CAD involving native coronary artery of native heart without angina Baseline  nonobstructive CAD Continue Plavix    History of ischemic stroke History of multiple prior strokes in the past with chronic lower extremity weakness Continue statin, Plavix    Tobacco dependence Endorses quarter pack per day smoker Cessation counseling provided Nicotine  patch for now   Chronic Back pain Continue TID tramadol  and pregabalin , Baclofen   Mood d/o Continue fluoxetine , melatonin, trazodone          Consultants: GI PT OT   Procedures: EGD 12/26 Echocardiogram 12/28   Antibiotics: Azithromycin  12/31-1/2 Augmentin  1/2-    30 Day Unplanned Readmission Risk Score    Flowsheet Row ED to Hosp-Admission (Current) from 79/25/2024 in [facility] 1C MEDICAL TELEMETRY  30 Day Unplanned Readmission Risk Score (%) 27.36 Filed at 12/18/2023 1200       This score is the patient's risk of an unplanned readmission within 30 days of being discharged (0 -100%). The score is based on dignosis, age, lab data, medications, orders, and past utilization.   Low:  0-14.9   Medium: 15-21.9   High: 22-29.9   Extreme: 30 and above           Subjective: Continues to complain of cough.  I'm not going anywhere until I am better.   Objective: Vitals:   12/18/23 0711 12/18/23 0832  BP:  (!) 107/47  Pulse:  70  Resp:  15  Temp:  97.6 F (36.4 C)  SpO2: 92% 91%    Intake/Output Summary (Last 24 hours) at 12/18/2023 1450 Last data filed at 12/18/2023 0835 Gross per 24 hour  Intake --  Output 1500 ml  Net -1500 ml   [employer]  12/11/23 1124 12/11/23 2100  Weight: 54.9 kg 54.9 kg    Exam:  General:  Appears calm and comfortable and is in NAD, chronically ill-appearing, on 2L [place] O2 Eyes:   EOMI, normal lids, iris ENT:  grossly normal hearing, lips & tongue, mmm Neck:  no LAD, masses or thyromegaly Cardiovascular:  RRR, no m/r/g. No LE edema.  Respiratory:   Diffuse wheezing.  Normal respiratory effort on [place] O2. Abdomen:  soft, NT, ND Skin:   no rash or induration seen on limited exam Musculoskeletal:  grossly normal tone BUE/BLE, good ROM, no bony abnormality Psychiatric:  blunted mood and affect, speech fluent and appropriate, AOx3 Neurologic:  CN 2-12 grossly intact, moves all extremities in coordinated fashion  Data Reviewed: I have reviewed the patient's lab results since admission.  Pertinent labs for today include:  Glucose 130 BUN 41 (27 on admission)/Creatinine 1.18/GFR >60 Albumin 2.8 AST 37/ALT 49 WBC 16.4 Hgb 10.5 - stable       Family Communication: None present  Disposition: Status is: Inpatient Remains inpatient appropriate because: ongoing evaluation and management     Time spent: 50 minutes  Unresulted Labs (From admission, onward)     Start     Ordered   12/15/23 0500  Phosphorus  Daily,   R      12/14/23 1553   12/15/23 0500  Comprehensive metabolic panel  Daily,   R      12/14/23 1553   12/15/23 0500  CBC with Differential/Platelet  Daily,   R      12/14/23 1553   12/15/23 0500  Magnesium   Daily,   R      12/14/23 1553             Author: Delon Herald, MD 12/18/2023 2:50 PM  For on call review [email or web address].

## 2023-12-18 NOTE — Progress Notes (Signed)
 Physical Therapy Treatment Patient Details Name: Logan Vazquez MRN: [id number] DOB: 09/24/1946 Today's Date: 12/18/2023   History of Present Illness 78 y.o. male with medical history significant of CAD, AAA status postrepair, chronic iron deficiency anemia, asthma, COPD, hypertension, chronic back pain presenting with upper GI bleed.  Patient reports recurrent epigastric pain over the past 24 hours.  Has had multiple episodes of bloody vomiting. Cough and shortness of breath.    PT Comments  Pt seen for PT tx with pt agreeable. Pt c/o chest pain, noting it's soreness & has been ongoing since admission, nurse made aware. Pt on 2L/min via nasal cannula throughout session, SPO2 >90% despite heavy breathing at times. Pt is able to complete bed mobility with supervision, STS with supervision, & ambulate with RW & supervision. Pt demonstrates significant forward flexed posture but reports this is baseline. Of note, pt with incontinent BM upon PT arrival & PT assisted pt with changing into clean gown. Will continue to follow pt acutely to address endurance, balance, & gait with LRAD.  Pt reports he lives in group home with ramp to enter 1 level home.    If plan is discharge home, recommend the following: A little help with walking and/or transfers;A little help with bathing/dressing/bathroom;Assistance with feeding;Assist for transportation   Can travel by private vehicle        Equipment Recommendations  Other (comment);None recommended by PT    Recommendations for Other Services       Precautions / Restrictions Precautions Precautions: Fall Restrictions Weight Bearing Restrictions Per Provider Order: No     Mobility  Bed Mobility Overal bed mobility: Needs Assistance Bed Mobility: Supine to Sit     Supine to sit: Supervision, HOB elevated, Used rails          Transfers Overall transfer level: Needs assistance Equipment used: Rolling walker (2 wheels) Transfers: Sit to/from  Stand Sit to Stand: Supervision                Ambulation/Gait Ambulation/Gait assistance: Supervision Gait Distance (Feet): 85 Feet (+ 85 ft) Assistive device: Rolling walker (2 wheels) Gait Pattern/deviations: Decreased step length - right, Decreased step length - left, Decreased stride length, Trunk flexed Gait velocity: decreased     General Gait Details: significant trunk flexion but pt reports this is baseline   Optometrist     Tilt Bed    Modified Rankin (Stroke Patients Only)       Balance Overall balance assessment: Needs assistance Sitting-balance support: No upper extremity supported Sitting balance-Leahy Scale: Good     Standing balance support: During functional activity, Bilateral upper extremity supported, Reliant on assistive device for balance Standing balance-Leahy Scale: Fair                              Cognition Arousal: Alert Behavior During Therapy: WFL for tasks assessed/performed Overall Cognitive Status: Within Functional Limits for tasks assessed                                 General Comments: Pt oriented to location, time, follows commands throughout session.        Exercises      General Comments General comments (skin integrity, edema, etc.): Pt on 2L/min via nasal cannula, SpO2 >90%  Pertinent Vitals/Pain Pain Assessment Pain Assessment: Faces Faces Pain Scale: Hurts a little bit Pain Location: chest Pain Descriptors / Indicators: Sore Pain Intervention(s): Monitored during session (notified nurse)    Home Living                          Prior Function            PT Goals (current goals can now be found in the care plan section) Acute Rehab PT Goals Patient Stated Goal: get better PT Goal Formulation: With patient Time For Goal Achievement: 12/24/23 Potential to Achieve Goals: Good Progress towards PT goals: Progressing toward  goals    Frequency    Min 1X/week      PT Plan      Co-evaluation              AM-PAC PT 6 Clicks Mobility   Outcome Measure  Help needed turning from your back to your side while in a flat bed without using bedrails?: None Help needed moving from lying on your back to sitting on the side of a flat bed without using bedrails?: A Little Help needed moving to and from a bed to a chair (including a wheelchair)?: A Little Help needed standing up from a chair using your arms (e.g., wheelchair or bedside chair)?: A Little Help needed to walk in hospital room?: A Little Help needed climbing 3-5 steps with a railing? : A Little 6 Click Score: 19    End of Session Equipment Utilized During Treatment: Oxygen Activity Tolerance: Patient tolerated treatment well Patient left: in chair;with chair alarm set;with call bell/phone within reach Nurse Communication: Mobility status (c/o chest soreness) PT Visit Diagnosis: Muscle weakness (generalized) (M62.81);Difficulty in walking, not elsewhere classified (R26.2);Unsteadiness on feet (R26.81);Other abnormalities of gait and mobility (R26.89)     Time: 8473-8453 PT Time Calculation (min) (ACUTE ONLY): 20 min  Charges:    $Therapeutic Activity: 8-22 mins PT General Charges $$ ACUTE PT VISIT: 1 Visit                     Richerd Pinal, PT, DPT 12/18/23, 3:56 PM   [name] 12/18/2023, 3:54 PM

## 2023-12-19 DIAGNOSIS — K922 Gastrointestinal hemorrhage, unspecified: Secondary | ICD-10-CM | POA: Diagnosis not present

## 2023-12-19 LAB — MAGNESIUM: Magnesium: 2.5 mg/dL — ABNORMAL HIGH (ref 1.7–2.4)

## 2023-12-19 LAB — CBC WITH DIFFERENTIAL/PLATELET
Abs Immature Granulocytes: 0.71 10*3/uL — ABNORMAL HIGH (ref 0.00–0.07)
Basophils Absolute: 0.1 10*3/uL (ref 0.0–0.1)
Basophils Relative: 0 %
Eosinophils Absolute: 0 10*3/uL (ref 0.0–0.5)
Eosinophils Relative: 0 %
HCT: 29.1 % — ABNORMAL LOW (ref 39.0–52.0)
Hemoglobin: 9.9 g/dL — ABNORMAL LOW (ref 13.0–17.0)
Immature Granulocytes: 3 %
Lymphocytes Relative: 6 %
Lymphs Abs: 1.3 10*3/uL (ref 0.7–4.0)
MCH: 30.7 pg (ref 26.0–34.0)
MCHC: 34 g/dL (ref 30.0–36.0)
MCV: 90.4 fL (ref 80.0–100.0)
Monocytes Absolute: 1 10*3/uL (ref 0.1–1.0)
Monocytes Relative: 4 %
Neutro Abs: 20.4 10*3/uL — ABNORMAL HIGH (ref 1.7–7.7)
Neutrophils Relative %: 87 %
Platelets: 390 10*3/uL (ref 150–400)
RBC: 3.22 MIL/uL — ABNORMAL LOW (ref 4.22–5.81)
RDW: 17.2 % — ABNORMAL HIGH (ref 11.5–15.5)
WBC: 23.5 10*3/uL — ABNORMAL HIGH (ref 4.0–10.5)
nRBC: 0 % (ref 0.0–0.2)

## 2023-12-19 LAB — URINALYSIS, COMPLETE (UACMP) WITH MICROSCOPIC
Bacteria, UA: NONE SEEN
Bilirubin Urine: NEGATIVE
Glucose, UA: NEGATIVE mg/dL
Hgb urine dipstick: NEGATIVE
Ketones, ur: NEGATIVE mg/dL
Leukocytes,Ua: NEGATIVE
Nitrite: NEGATIVE
Protein, ur: NEGATIVE mg/dL
Specific Gravity, Urine: 1.009 (ref 1.005–1.030)
Squamous Epithelial / HPF: 0 /[HPF] (ref 0–5)
pH: 5 (ref 5.0–8.0)

## 2023-12-19 LAB — COMPREHENSIVE METABOLIC PANEL
ALT: 36 U/L (ref 0–44)
AST: 25 U/L (ref 15–41)
Albumin: 2.5 g/dL — ABNORMAL LOW (ref 3.5–5.0)
Alkaline Phosphatase: 103 U/L (ref 38–126)
Anion gap: 11 (ref 5–15)
BUN: 49 mg/dL — ABNORMAL HIGH (ref 8–23)
CO2: 25 mmol/L (ref 22–32)
Calcium: 7.6 mg/dL — ABNORMAL LOW (ref 8.9–10.3)
Chloride: 97 mmol/L — ABNORMAL LOW (ref 98–111)
Creatinine, Ser: 1.83 mg/dL — ABNORMAL HIGH (ref 0.61–1.24)
GFR, Estimated: 37 mL/min — ABNORMAL LOW (ref >60)
Glucose, Bld: 133 mg/dL — ABNORMAL HIGH (ref 70–99)
Potassium: 3.8 mmol/L (ref 3.5–5.1)
Sodium: 133 mmol/L — ABNORMAL LOW (ref 135–145)
Total Bilirubin: 0.8 mg/dL (ref 0.0–1.2)
Total Protein: 5.6 g/dL — ABNORMAL LOW (ref 6.5–8.1)

## 2023-12-19 LAB — PHOSPHORUS: Phosphorus: 4.8 mg/dL — ABNORMAL HIGH (ref 2.5–4.6)

## 2023-12-19 MED ORDER — LIDOCAINE 5 % EX PTCH
1.0000 | MEDICATED_PATCH | CUTANEOUS | Status: DC
  Administered 2023-12-19 – 2023-12-23 (×3): 1 via TRANSDERMAL
  Filled 2023-12-19 (×5): qty 1

## 2023-12-19 MED ORDER — LACTATED RINGERS IV BOLUS
500.0000 mL | Freq: Once | INTRAVENOUS | Status: AC
  Administered 2023-12-19: 500 mL via INTRAVENOUS

## 2023-12-19 NOTE — Progress Notes (Signed)
 Occupational Therapy Treatment Patient Details Name: Logan Vazquez MRN: [id number] DOB: [birth year] Today's Date: 12/19/2023   History of present illness 78 y.o. male with medical history significant of CAD, AAA status postrepair, chronic iron deficiency anemia, asthma, COPD, hypertension, chronic back pain presenting with upper GI bleed.  Patient reports recurrent epigastric pain over the past 24 hours.  Has had multiple episodes of bloody vomiting. Cough and shortness of breath.   OT comments  Pt is supine in bed on arrival. Pleasant and agreeable to OT session. Pt denies pain, but continues to have coughing throughout session. Sp02 89-90% on 2L, increased to 3L for activity during session. Pt with noted drop to 76% with long coughing spell, but improved with time. Pt stood at sink to perform oral care with SBA, forward flexed posture with BUE support on counter. Pt agreeable to short distance mobility in the room to the door and back x2 trials with seated rest break in between due to increased fatigue and pt not feeling well today. SUP/SBA using RW for all mobility with  cueing for upright posture and staying in base of walker. Pt left seated in recliner at end of session on 2.5L with sp02 at 91%. Nurse notified of change. Pt with all needs in place and will cont to require skilled acute OT services to maximize her safety and IND to return to PLOF.       If plan is discharge home, recommend the following:  A little help with walking and/or transfers;A little help with bathing/dressing/bathroom;Help with stairs or ramp for entrance;Assist for transportation;Assistance with cooking/housework;Supervision due to cognitive status;Direct supervision/assist for medications management;Direct supervision/assist for financial management   Equipment Recommendations  Other (comment);Tub/shower seat;BSC/3in1 (RW)    Recommendations for Other Services      Precautions / Restrictions Precautions Precautions:  Fall Restrictions Weight Bearing Restrictions Per Provider Order: No       Mobility Bed Mobility Overal bed mobility: Needs Assistance Bed Mobility: Supine to Sit     Supine to sit: Supervision, HOB elevated, Used rails     General bed mobility comments: bed rail use, no physical assist, increased time/effort    Transfers Overall transfer level: Needs assistance Equipment used: Rolling walker (2 wheels) Transfers: Sit to/from Stand Sit to Stand: Contact guard assist, Supervision           General transfer comment: SUP to CGA for STS and mobility to the door and back x2-pt reports weak and not feeling great today     Balance Overall balance assessment: Needs assistance Sitting-balance support: No upper extremity supported Sitting balance-Leahy Scale: Good     Standing balance support: During functional activity, Bilateral upper extremity supported, Reliant on assistive device for balance Standing balance-Leahy Scale: Fair Standing balance comment: forward flexed posture, cueing for upright posture, reliant on RW for support                           ADL either performed or assessed with clinical judgement   ADL Overall ADL's : Needs assistance/impaired     Grooming: Oral care;Standing;Supervision/safety                   Toilet Transfer: Contact guard assist Toilet Transfer Details (indicate cue type and reason): simulated to recliner                Extremity/Trunk Assessment  Vision       Perception     Praxis      Cognition                                                Exercises Other Exercises Other Exercises: Edu on PLB techniques to prevent overexertion.    Shoulder Instructions       General Comments increased to 2.5-3L [place] d/t sp02 at 88-90% on 2L at rest; dropped to 76% with coughing spell, but improved quickly    Pertinent Vitals/ Pain       Pain Assessment Pain  Assessment: No/denies pain Pain Location: pt with continued cough throughout session Pain Intervention(s): Monitored during session  Home Living                                          Prior Functioning/Environment              Frequency  Min 1X/week        Progress Toward Goals  OT Goals(current goals can now be found in the care plan section)  Progress towards OT goals: Progressing toward goals  Acute Rehab OT Goals Patient Stated Goal: improve breathing OT Goal Formulation: With patient Time For Goal Achievement: 12/26/23 Potential to Achieve Goals: Fair  Plan      Co-evaluation                 AM-PAC OT 6 Clicks Daily Activity     Outcome Measure   Help from another person eating meals?: None Help from another person taking care of personal grooming?: None Help from another person toileting, which includes using toliet, bedpan, or urinal?: A Little Help from another person bathing (including washing, rinsing, drying)?: A Little Help from another person to put on and taking off regular upper body clothing?: None Help from another person to put on and taking off regular lower body clothing?: A Little 6 Click Score: 21    End of Session Equipment Utilized During Treatment: Rolling walker (2 wheels)  OT Visit Diagnosis: Other abnormalities of gait and mobility (R26.89);Muscle weakness (generalized) (M62.81)   Activity Tolerance Patient tolerated treatment well;Patient limited by fatigue   Patient Left with call bell/phone within reach;in chair;with chair alarm set   Nurse Communication Mobility status        Time: [phone] OT Time Calculation (min): 23 min  Charges: OT General Charges $OT Visit: 1 Visit OT Treatments $Self Care/Home Management : 8-22 mins $Therapeutic Activity: 8-22 mins  Logan Vazquez, OTR/L  12/19/23, 10:29 AM   Logan Vazquez 12/19/2023, 10:27 AM

## 2023-12-19 NOTE — Progress Notes (Signed)
 PT Cancellation Note  Patient Details Name: Logan Vazquez MRN: [id number] DOB: 1946/01/25   Cancelled Treatment:    Reason Eval/Treat Not Completed: Other (comment) Attempted to see pt for PT tx. Pt received in recliner, reporting he cannot participate in therapy at this time, noting he is having issues with his kidneys & they need to get the water off of him. PT offered to assist pt to walk to bathroom but pt becoming irritable, continuing to decline participation. Will f/u as able.  Richerd Pinal, PT, DPT 12/19/23, 2:32 PM   [name] 12/19/2023, 2:31 PM

## 2023-12-19 NOTE — Progress Notes (Signed)
 Progress Note   Patient: Logan Vazquez FMW:[id number] DOB: [birth year] DOA: 12/10/2023     9 DOS: the patient was seen and examined on 12/19/2023   Brief hospital course: 78yo with h/o CAD, AAA s/p repair, COPD, HTN, and chronic back pain who presented with UGI bleeding.  CTA negative.  EGD on 12/26 negative.  Developed SOB with hypoxia; given Lasix  and treated for COPD exacerbation.  Continues to have coarse cough and feel terrible.  Assessment and Plan:  Upper GI bleed CT angio negative for acute bleed but did reveal narrowing in the colon that could represent mass versus underdistention GI consulted EGD 12/26 without acute bleed or evidence of old blood.  Suspected healed Mallory-Weiss tear On PPI now Tolerating diet, no further vomiting BUN is uptrending but hemoglobin remains stable   COPD, now with exacerbation Requiring [place] O2; ambulatory pulse ox to 79%, he needs home O2 and this has been ordered Methylprednisolone  -> prednisone  x 5 days Azithromycin  -> Augmentin  since he is still having ongoing coarse cough Repeat CXR with atelectasis Continue Arnuity, Mucinex  Will add Hycodan prn Change Spiriva  to Duonebs q6h + q2h prn albuterol  Consider chest CT if he does not show improvement soon  AKI Creatinine 1.83 today, 1.15 on presentation Likely from poor PO intake + diuresis Will give 500 cc bolus Recheck BMP in AM   Chronic diastolic CHF Increased interstitial edema on 12/27 CXR Treated with Lasix  40 mg twice daily Echocardiogram: LVEF 60 to 65%, no regional wall motion abnormalities grade 1 diastolic dysfunction. Appears to be compensated at this time   Hyponatremia Chronic and stable during hospitalization Recommend outpatient f/u   Paroxsymal Atrial fibrillation On Eliquis  at home EKG NSR on arrival.  Hold anticoagulation for now in setting of GI bleed Currently rate controlled, continue metoprolol    Hypertension Resume home medications - amlodipine , lisinopril ,  lopressor    AAA Status post repair with no evidence of endoleak on CT   CAD involving native coronary artery of native heart without angina Baseline nonobstructive CAD Continue Plavix    History of ischemic stroke History of multiple prior strokes in the past with chronic lower extremity weakness Continue statin, Plavix    Tobacco dependence Endorses quarter pack per day smoker Cessation counseling provided Nicotine  patch for now   Chronic Back pain Continue TID tramadol  and pregabalin , Baclofen   Mood d/o Continue fluoxetine , melatonin, trazodone          Consultants: GI PT OT   Procedures: EGD 12/26 Echocardiogram 12/28   Antibiotics: Azithromycin  12/31-1/2 Augmentin  1/2-     30 Day Unplanned Readmission Risk Score    Flowsheet Row ED to Hosp-Admission (Current) from 12/10/2023 in [facility] 1C MEDICAL TELEMETRY  30 Day Unplanned Readmission Risk Score (%) 27.84 Filed at 12/19/2023 0401       This score is the patient's risk of an unplanned readmission within 30 days of being discharged (0 -100%). The score is based on dignosis, age, lab data, medications, orders, and past utilization.   Low:  0-14.9   Medium: 15-21.9   High: 22-29.9   Extreme: 30 and above           Subjective: Still feels terrible.  No significant improvement.  Coarse persistent cough.   Objective: Vitals:   12/19/23 0728 12/19/23 0736  BP:  (!) 100/47  Pulse:  (!) 55  Resp:    Temp:    SpO2: 96% 96%    Intake/Output Summary (Last 24 hours) at 12/19/2023 1216 Last  data filed at 12/18/2023 2056 Gross per 24 hour  Intake --  Output 450 ml  Net -450 ml   Filed Weights   12/11/23 1124 12/11/23 2100  Weight: 54.9 kg 54.9 kg    Exam:  General:  Appears calm and comfortable and is in NAD, chronically ill-appearing, on 2L [place] O2 Eyes:   EOMI, normal lids, iris ENT:  grossly normal hearing, lips & tongue, mmm Neck:  no LAD, masses or  thyromegaly Cardiovascular:  RRR, no m/r/g. No LE edema.  Respiratory:  R > L diffuse rhonchi with wheezing.  Normal respiratory effort on [place] O2. Abdomen:  soft, NT, ND Skin:  no rash or induration seen on limited exam Musculoskeletal:  grossly normal tone BUE/BLE, good ROM, no bony abnormality Psychiatric:  blunted mood and affect, speech fluent and appropriate, AOx3 Neurologic:  CN 2-12 grossly intact, moves all extremities in coordinated fashion  Data Reviewed: I have reviewed the patient's lab results since admission.  Pertinent labs for today include:   Glucose 133 BUN 49/Creatinine 1.83/GFR 37; 34/1.04/>60 on 12/31 Albumin 2.5 WBC 23.5 Hgb 9.9    Family Communication: None present  Disposition: Status is: Inpatient Remains inpatient appropriate because: ongoing evaluation and management     Time spent: 50 minutes  Unresulted Labs (From admission, onward)     Start     Ordered   12/20/23 0500  CBC with Differential/Platelet  Tomorrow morning,   R       Question:  Specimen collection method  Answer:  Lab=Lab collect   12/19/23 1215   12/20/23 0500  Basic metabolic panel  Tomorrow morning,   R       Question:  Specimen collection method  Answer:  Lab=Lab collect   12/19/23 1215             Author: Delon Herald, MD 12/19/2023 12:16 PM  For on call review [email or web address].

## 2023-12-19 NOTE — TOC Progression Note (Signed)
 Transition of Care [facility]) - Progression Note    Patient Details  Name: SUMNER KIRCHMAN MRN: [id number] Date of Birth: 1946-07-04  Transition of Care [facility]) CM/SW Contact  Katelan Hirt A Bellina Tokarczyk, RN Phone Number: 12/19/2023, 12:52 PM  Clinical Narrative:    Chart reviewed. Patient continues to have ongoing continued coughing. Providers to continue medical management.  I have updated Tammy from [facility].    TOC will continue to follow for discharge planning.         Expected Discharge Plan and Services                                               Social Determinants of Health (SDOH) Interventions SDOH Screenings   Food Insecurity: No Food Insecurity (12/11/2023)  Housing: Unknown (12/11/2023)  Transportation Needs: No Transportation Needs (12/11/2023)  Utilities: Not At Risk (12/11/2023)  Depression (PHQ2-9): Low Risk  (12/04/2021)  Social Connections: Patient Declined (12/17/2023)  Tobacco Use: High Risk (12/11/2023)    Readmission Risk Interventions     No data to display

## 2023-12-20 ENCOUNTER — Inpatient Hospital Stay: Payer: Medicare HMO

## 2023-12-20 DIAGNOSIS — K922 Gastrointestinal hemorrhage, unspecified: Secondary | ICD-10-CM | POA: Diagnosis not present

## 2023-12-20 LAB — BASIC METABOLIC PANEL
Anion gap: 8 (ref 5–15)
BUN: 42 mg/dL — ABNORMAL HIGH (ref 8–23)
CO2: 26 mmol/L (ref 22–32)
Calcium: 7.7 mg/dL — ABNORMAL LOW (ref 8.9–10.3)
Chloride: 99 mmol/L (ref 98–111)
Creatinine, Ser: 1.49 mg/dL — ABNORMAL HIGH (ref 0.61–1.24)
GFR, Estimated: 48 mL/min — ABNORMAL LOW (ref >60)
Glucose, Bld: 171 mg/dL — ABNORMAL HIGH (ref 70–99)
Potassium: 3.7 mmol/L (ref 3.5–5.1)
Sodium: 133 mmol/L — ABNORMAL LOW (ref 135–145)

## 2023-12-20 LAB — CBC WITH DIFFERENTIAL/PLATELET
Abs Immature Granulocytes: 0.58 10*3/uL — ABNORMAL HIGH (ref 0.00–0.07)
Basophils Absolute: 0.1 10*3/uL (ref 0.0–0.1)
Basophils Relative: 0 %
Eosinophils Absolute: 0 10*3/uL (ref 0.0–0.5)
Eosinophils Relative: 0 %
HCT: 29 % — ABNORMAL LOW (ref 39.0–52.0)
Hemoglobin: 9.7 g/dL — ABNORMAL LOW (ref 13.0–17.0)
Immature Granulocytes: 3 %
Lymphocytes Relative: 9 %
Lymphs Abs: 1.8 10*3/uL (ref 0.7–4.0)
MCH: 30.8 pg (ref 26.0–34.0)
MCHC: 33.4 g/dL (ref 30.0–36.0)
MCV: 92.1 fL (ref 80.0–100.0)
Monocytes Absolute: 0.9 10*3/uL (ref 0.1–1.0)
Monocytes Relative: 4 %
Neutro Abs: 17.1 10*3/uL — ABNORMAL HIGH (ref 1.7–7.7)
Neutrophils Relative %: 84 %
Platelets: 381 10*3/uL (ref 150–400)
RBC: 3.15 MIL/uL — ABNORMAL LOW (ref 4.22–5.81)
RDW: 17.2 % — ABNORMAL HIGH (ref 11.5–15.5)
WBC: 20.4 10*3/uL — ABNORMAL HIGH (ref 4.0–10.5)
nRBC: 0 % (ref 0.0–0.2)

## 2023-12-20 LAB — GLUCOSE, CAPILLARY: Glucose-Capillary: 116 mg/dL — ABNORMAL HIGH (ref 70–99)

## 2023-12-20 LAB — URINE CULTURE: Culture: NO GROWTH

## 2023-12-20 MED ORDER — AMLODIPINE BESYLATE 5 MG PO TABS
5.0000 mg | ORAL_TABLET | Freq: Every day | ORAL | Status: DC
  Administered 2023-12-20: 5 mg via ORAL
  Filled 2023-12-20: qty 1

## 2023-12-20 NOTE — Plan of Care (Signed)

## 2023-12-20 NOTE — Plan of Care (Signed)

## 2023-12-20 NOTE — Plan of Care (Signed)
  Problem: Education: Goal: Knowledge of General Education information will improve Description: Including pain rating scale, medication(s)/side effects and non-pharmacologic comfort measures Outcome: Progressing   Problem: Clinical Measurements: Goal: Ability to maintain clinical measurements within normal limits will improve Outcome: Progressing Goal: Will remain free from infection Outcome: Progressing Goal: Diagnostic test results will improve Outcome: Progressing Goal: Respiratory complications will improve Outcome: Progressing Goal: Cardiovascular complication will be avoided Outcome: Progressing   Problem: Nutrition: Goal: Adequate nutrition will be maintained Outcome: Progressing   Problem: Elimination: Goal: Will not experience complications related to bowel motility Outcome: Progressing Goal: Will not experience complications related to urinary retention Outcome: Progressing   Problem: Pain Management: Goal: General experience of comfort will improve Outcome: Progressing   Problem: Safety: Goal: Ability to remain free from injury will improve Outcome: Progressing

## 2023-12-20 NOTE — Progress Notes (Signed)
 Progress Note   Patient: Logan Vazquez FMW:[id number] DOB: 23-Apr-1946 DOA: 12/10/2023     10 DOS: the patient was seen and examined on 12/20/2023   Brief hospital course: 78yo with h/o CAD, AAA s/p repair, COPD, HTN, and chronic back pain who presented with UGI bleeding.  CTA negative.  EGD on 12/26 negative.  Developed SOB with hypoxia; given Lasix  and treated for COPD exacerbation.  Continues to have coarse cough and feel terrible.  Assessment and Plan:  Upper GI bleed CT angio negative for acute bleed but did reveal narrowing in the colon that could represent mass versus underdistention GI consulted EGD 12/26 without acute bleed or evidence of old blood.  Suspected healed Mallory-Weiss tear On PPI now Tolerating diet, no further vomiting    COPD, now with exacerbation Requiring [place] O2; ambulatory pulse ox to 86%, he needs home O2 and this has been ordered Methylprednisolone  -> prednisone  x 5 days Azithromycin  -> Augmentin  since he is still having ongoing coarse cough Repeat CXR with atelectasis-- incentive spirometry Continue Arnuity, Mucinex  Will add Hycodan prn Change Spiriva  to Duonebs q6h + q2h prn albuterol  Check x ray again and consider chest CT in AM   AKI -1.15 on presentation Likely from poor PO intake + diuresis -trending down post IVF   Chronic diastolic CHF Increased interstitial edema on 12/27 CXR Treated with Lasix  40 mg twice daily Echocardiogram: LVEF 60 to 65%, no regional wall motion abnormalities grade 1 diastolic dysfunction. Appears to be compensated at this time   Hyponatremia Chronic and stable during hospitalization Recommend outpatient f/u   Paroxsymal Atrial fibrillation On Eliquis  at home EKG NSR on arrival.  Hold anticoagulation for now in setting of GI bleed Currently rate controlled, continue metoprolol    Hypertension Resume home medications - amlodipine , lisinopril , lopressor    AAA Status post repair with no evidence of endoleak on  CT   CAD involving native coronary artery of native heart without angina Baseline nonobstructive CAD Continue Plavix    History of ischemic stroke History of multiple prior strokes in the past with chronic lower extremity weakness Continue statin, Plavix    Tobacco dependence Endorses quarter pack per day smoker Cessation counseling provided Nicotine  patch for now   Chronic Back pain Continue TID tramadol  and pregabalin , Baclofen   Mood d/o Continue fluoxetine , melatonin, trazodone          Consultants: GI PT/OT   Procedures: EGD 12/26 Echocardiogram 12/28   Antibiotics: Azithromycin  12/31-1/2 Augmentin  1/2-      Subjective: still having breathing issue   Objective: Vitals:   12/20/23 0416 12/20/23 0804  BP: (!) 100/52 (!) 103/48  Pulse: 65 (!) 53  Resp: 18 18  Temp: (!) 97.5 F (36.4 C) 97.8 F (36.6 C)  SpO2: 97% 96%    Intake/Output Summary (Last 24 hours) at 12/20/2023 0853 Last data filed at 12/19/2023 2210 Gross per 24 hour  Intake 558.46 ml  Output 800 ml  Net -241.54 ml   Filed Weights   12/11/23 1124 12/11/23 2100  Weight: 54.9 kg 54.9 kg    Exam:   General: Appearance:    Thin male in no acute distress     Lungs:     On [place], expiratory wheezing  Heart:    Bradycardic.    MS:   All extremities are intact.   Neurologic:   Awake, alert     Data Reviewed: I have reviewed the patient's lab results since admission.  Pertinent labs for today include:  Cr. 1.49  Family Communication: None present  Disposition: Status is: Inpatient Remains inpatient appropriate because: ongoing evaluation and management    Time spent: 50 minutes    Author: Lavonn Maxcy U Shanea Karney, DO 12/20/2023 8:53 AM  For on call review [email or web address].

## 2023-12-21 DIAGNOSIS — K922 Gastrointestinal hemorrhage, unspecified: Secondary | ICD-10-CM | POA: Diagnosis not present

## 2023-12-21 LAB — BASIC METABOLIC PANEL
Anion gap: 7 (ref 5–15)
BUN: 34 mg/dL — ABNORMAL HIGH (ref 8–23)
CO2: 27 mmol/L (ref 22–32)
Calcium: 8 mg/dL — ABNORMAL LOW (ref 8.9–10.3)
Chloride: 101 mmol/L (ref 98–111)
Creatinine, Ser: 1.41 mg/dL — ABNORMAL HIGH (ref 0.61–1.24)
GFR, Estimated: 51 mL/min — ABNORMAL LOW (ref >60)
Glucose, Bld: 99 mg/dL (ref 70–99)
Potassium: 4.1 mmol/L (ref 3.5–5.1)
Sodium: 135 mmol/L (ref 135–145)

## 2023-12-21 LAB — RESPIRATORY PANEL BY PCR

## 2023-12-21 LAB — CBC
HCT: 33.5 % — ABNORMAL LOW (ref 39.0–52.0)
Hemoglobin: 10.8 g/dL — ABNORMAL LOW (ref 13.0–17.0)
MCH: 30.6 pg (ref 26.0–34.0)
MCHC: 32.2 g/dL (ref 30.0–36.0)
MCV: 94.9 fL (ref 80.0–100.0)
Platelets: 442 10*3/uL — ABNORMAL HIGH (ref 150–400)
RBC: 3.53 MIL/uL — ABNORMAL LOW (ref 4.22–5.81)
RDW: 17.4 % — ABNORMAL HIGH (ref 11.5–15.5)
WBC: 18.2 10*3/uL — ABNORMAL HIGH (ref 4.0–10.5)
nRBC: 0 % (ref 0.0–0.2)

## 2023-12-21 MED ORDER — LISINOPRIL 20 MG PO TABS
20.0000 mg | ORAL_TABLET | Freq: Every day | ORAL | Status: DC
  Administered 2023-12-21 – 2023-12-23 (×2): 20 mg via ORAL
  Filled 2023-12-21 (×2): qty 1

## 2023-12-21 MED ORDER — PREDNISONE 20 MG PO TABS
40.0000 mg | ORAL_TABLET | Freq: Every day | ORAL | Status: DC
  Administered 2023-12-22 – 2023-12-23 (×2): 40 mg via ORAL
  Filled 2023-12-21 (×2): qty 2

## 2023-12-21 NOTE — Progress Notes (Signed)
 Progress Note   Patient: Logan Vazquez FMW:[id number] DOB: 04-18-1946 DOA: 12/10/2023     11 DOS: the patient was seen and examined on 12/21/2023   Brief hospital course: 78yo with h/o CAD, AAA s/p repair, COPD, HTN, and chronic back pain who presented with UGI bleeding.  CTA negative.  EGD on 12/26 negative.  Developed SOB with hypoxia; given Lasix  and treated for COPD exacerbation.  Continues to have coarse cough and feel terrible.  Assessment and Plan:  Upper GI bleed CT angio negative for acute bleed but did reveal narrowing in the colon that could represent mass versus underdistention GI consulted EGD 12/26 without acute bleed or evidence of old blood.  Suspected healed Mallory-Weiss tear On PPI now Tolerating diet, no further vomiting    COPD, now with exacerbation Requiring [place] O2; ambulatory pulse ox to 86%, he needs home O2 and this has been ordered Methylprednisolone  -> prednisone  Azithromycin  -> Augmentin  since he is still having ongoing coarse cough Repeat CXR with atelectasis-- incentive spirometry  added with limited improvement Continue Arnuity, Mucinex  - Hycodan prn Change Spiriva  to Duonebs q6h + q2h prn albuterol  - consider chest CT in AM if NP swab unrevealing  AKI -1.15 on presentation Likely from poor PO intake + diuresis -trending down post IVF   Chronic diastolic CHF Increased interstitial edema on 12/27 CXR Treated with Lasix  40 mg twice daily Echocardiogram: LVEF 60 to 65%, no regional wall motion abnormalities grade 1 diastolic dysfunction. Appears to be compensated at this time   Hyponatremia Chronic and stable during hospitalization Recommend outpatient f/u   Paroxsymal Atrial fibrillation On Eliquis  at home EKG NSR on arrival.  Hold anticoagulation for now in setting of GI bleed Currently rate controlled, continue metoprolol    Hypertension Resume home medications - amlodipine , lisinopril , lopressor    AAA Status post repair with no  evidence of endoleak on CT   CAD involving native coronary artery of native heart without angina Baseline nonobstructive CAD Continue Plavix    History of ischemic stroke History of multiple prior strokes in the past with chronic lower extremity weakness Continue statin, Plavix    Tobacco dependence Endorses quarter pack per day smoker Cessation counseling provided Nicotine  patch for now   Chronic Back pain Continue TID tramadol  and pregabalin , Baclofen   Mood d/o Continue fluoxetine , melatonin, trazodone       OOB, ambulate    Consultants: GI PT/OT   Procedures: EGD 12/26 Echocardiogram 12/28   Antibiotics: Azithromycin  12/31-1/2 Augmentin  1/2-      Subjective:  C/o low back pain and still with issues with breathing   Objective: Vitals:   12/21/23 0529 12/21/23 0735  BP: (!) 109/57 (!) 114/52  Pulse: (!) 52 (!) 51  Resp: 18 20  Temp: 97.6 F (36.4 C) 97.9 F (36.6 C)  SpO2: 99% 99%    Intake/Output Summary (Last 24 hours) at 12/21/2023 1018 Last data filed at 12/21/2023 0530 Gross per 24 hour  Intake --  Output 250 ml  Net -250 ml   Filed Weights   12/11/23 1124 12/11/23 2100  Weight: 54.9 kg 54.9 kg    Exam:  General: Appearance:    Thin male in no acute distress     Lungs:     On , , wet sounding cough, respirations unlabored  Heart:    Bradycardic. irr   MS:   All extremities are intact.   Neurologic:   Awake, alert, oriented x 3. No apparent focal neurological  defect.     Data Reviewed: I have reviewed the patient's lab results since admission.  Pertinent labs for today include:  Cr. 1.41  Family Communication: None present  Disposition: Status is: Inpatient Remains inpatient appropriate because: ongoing evaluation and management     Time spent: 50 minutes    Author: Jermell Holeman U Almira [place], DO 12/21/2023 10:18 AM  For on call review [email or web address]

## 2023-12-22 ENCOUNTER — Inpatient Hospital Stay: Payer: Medicare HMO

## 2023-12-22 DIAGNOSIS — K922 Gastrointestinal hemorrhage, unspecified: Secondary | ICD-10-CM | POA: Diagnosis not present

## 2023-12-22 MED ORDER — IOHEXOL 300 MG/ML  SOLN
75.0000 mL | Freq: Once | INTRAMUSCULAR | Status: AC | PRN
  Administered 2023-12-22: 75 mL via INTRAVENOUS

## 2023-12-22 MED ORDER — LACTATED RINGERS IV BOLUS
500.0000 mL | Freq: Once | INTRAVENOUS | Status: AC
  Administered 2023-12-22: 500 mL via INTRAVENOUS

## 2023-12-22 MED ORDER — LACTATED RINGERS IV SOLN: INTRAVENOUS | Status: DC

## 2023-12-22 NOTE — Progress Notes (Signed)
 Occupational Therapy Treatment Patient Details Name: Logan Vazquez MRN: [id number] DOB: 1946-07-22 Today's Date: 12/22/2023   History of present illness 78 y.o. male with medical history significant of CAD, AAA status postrepair, chronic iron deficiency anemia, asthma, COPD, hypertension, chronic back pain presenting with upper GI bleed.  Patient reports recurrent epigastric pain over the past 24 hours.  Has had multiple episodes of bloody vomiting. Cough and shortness of breath.   OT comments  Pt seen for OT tx and cotx with PT to optimize pt's participation given poor activity tolerance. Pt received seated EOB finishing use of urinal. Pt notes he has been sitting EOB for approx prior to therapists arrival. Pt endorsing significant back pain. Pt required CGA with RW for mobility in the room with  +2 for O2 tubing mgt only. Once seated in the recliner, pt required additional instruction in PLB with visual and verbal cues to utilize. SpO2 remained above 96-97% on 2L throughout. Pt endorsed short walk as 8/10 perceived rate of exertion. Pt tolerated grooming in seated position, unsupported. Pt continues to benefit from skilled OT services to maximize return to PLOF and minimize functional decline.       If plan is discharge home, recommend the following:  A little help with walking and/or transfers;A little help with bathing/dressing/bathroom;Help with stairs or ramp for entrance;Assist for transportation;Assistance with cooking/housework;Supervision due to cognitive status;Direct supervision/assist for medications management;Direct supervision/assist for financial management   Equipment Recommendations  Other (comment);Tub/shower seat;BSC/3in1 (2WW)    Recommendations for Other Services      Precautions / Restrictions Precautions Precautions: Fall Restrictions Weight Bearing Restrictions Per Provider Order: No       Mobility Bed Mobility               General bed mobility  comments: pt seated EOB at start of session, in recliner at end of session    Transfers Overall transfer level: Needs assistance Equipment used: Rolling walker (2 wheels) Transfers: Sit to/from Stand Sit to Stand: +2 safety/equipment           General transfer comment: +2 for oxygen tubing mgt     Balance Overall balance assessment: Needs assistance Sitting-balance support: No upper extremity supported Sitting balance-Leahy Scale: Fair     Standing balance support: During functional activity, Bilateral upper extremity supported, Reliant on assistive device for balance Standing balance-Leahy Scale: Fair                             ADL either performed or assessed with clinical judgement   ADL Overall ADL's : Needs assistance/impaired     Grooming: Sitting;Wash/dry face;Oral care;Set up;Supervision/safety Grooming Details (indicate cue type and reason): sitting in recliner, no back support, after mobility.                                    Extremity/Trunk Assessment              Vision       Perception     Praxis      Cognition Arousal: Alert Behavior During Therapy: WFL for tasks assessed/performed Overall Cognitive Status: Within Functional Limits for tasks assessed  Exercises Other Exercises Other Exercises: Additional instruction provided in PLB with visual and verbal cues to utilize.    Shoulder Instructions       General Comments SpO2 stay above 96% on 2L with exertion    Pertinent Vitals/ Pain       Pain Assessment Pain Assessment: 0-10 Pain Score: 7  Pain Location: chronic back pain Pain Descriptors / Indicators: Aching Pain Intervention(s): Monitored during session, Limited activity within patient's tolerance, Repositioned  Home Living                                          Prior Functioning/Environment               Frequency  Min 1X/week        Progress Toward Goals  OT Goals(current goals can now be found in the care plan section)  Progress towards OT goals: Progressing toward goals  Acute Rehab OT Goals Patient Stated Goal: improve breathing OT Goal Formulation: With patient Time For Goal Achievement: 12/26/23 Potential to Achieve Goals: Fair  Plan      Co-evaluation    PT/OT/SLP Co-Evaluation/Treatment: Yes Reason for Co-Treatment: To address functional/ADL transfers;For patient/therapist safety;Other (comment) (poor activity tolerance) PT goals addressed during session: Mobility/safety with mobility OT goals addressed during session: ADL's and self-care      AM-PAC OT 6 Clicks Daily Activity     Outcome Measure   Help from another person eating meals?: None Help from another person taking care of personal grooming?: None Help from another person toileting, which includes using toliet, bedpan, or urinal?: A Little Help from another person bathing (including washing, rinsing, drying)?: A Little Help from another person to put on and taking off regular upper body clothing?: None Help from another person to put on and taking off regular lower body clothing?: A Little 6 Click Score: 21    End of Session Equipment Utilized During Treatment: Rolling walker (2 wheels);Oxygen  OT Visit Diagnosis: Other abnormalities of gait and mobility (R26.89);Muscle weakness (generalized) (M62.81)   Activity Tolerance Patient limited by fatigue;Patient tolerated treatment well   Patient Left in chair;with call bell/phone within reach;with nursing/sitter in room   Nurse Communication Mobility status        Time: 8444-8378 OT Time Calculation (min): 26 min  Charges: OT General Charges $OT Visit: 1 Visit OT Treatments $Therapeutic Activity: 8-22 mins  Warren SAUNDERS., MPH, MS, OTR/L ascom [phone] 12/22/23, 4:41 PM

## 2023-12-22 NOTE — Progress Notes (Signed)
 Physical Therapy Treatment Patient Details Name: Logan Vazquez MRN: [id number] DOB: 1946/11/14 Today's Date: 12/22/2023   History of Present Illness 78 y.o. male with medical history significant of CAD, AAA status postrepair, chronic iron deficiency anemia, asthma, COPD, hypertension, chronic back pain presenting with upper GI bleed.  Patient reports recurrent epigastric pain over the past 24 hours.  Has had multiple episodes of bloody vomiting. Cough and shortness of breath.    PT Comments  Pt received sitting EOB, co-tx with OT this pm due to pt's low tolerance for activity, limited endurance, and safe management of lines. Overall, good tolerance for activity. Pt able to ambulate with RW ~77ft with cues for upright posture and pacing while on 2L O2. SpO2 97% upon exertion with increased SOB and fatigue noted. Pt educated on PLB technique and benefits of continued use of IS and Acapella - pt states he uses each breathing apparatus every hour. Pt found to have evidence of aspiration PNA on chest CT, awaiting ST Eval. Continue PT per POC   If plan is discharge home, recommend the following: A little help with walking and/or transfers;A little help with bathing/dressing/bathroom;Assistance with feeding;Assist for transportation   Can travel by private vehicle        Equipment Recommendations  None recommended by PT    Recommendations for Other Services       Precautions / Restrictions Precautions Precautions: Fall Restrictions Weight Bearing Restrictions Per Provider Order: No     Mobility  Bed Mobility               General bed mobility comments: pt seated EOB at start of session, in recliner at end of session    Transfers Overall transfer level: Needs assistance Equipment used: Rolling walker (2 wheels) Transfers: Sit to/from Stand Sit to Stand: +2 safety/equipment           General transfer comment:  (Pt able to stand from slightly raised bed with CG/MinA. +2 for O2  management and low endurance level)    Ambulation/Gait Ambulation/Gait assistance: Contact guard assist Gait Distance (Feet): 60 Feet Assistive device: Rolling walker (2 wheels) Gait Pattern/deviations: Decreased step length - right, Decreased step length - left, Decreased stride length, Trunk flexed Gait velocity: decreased     General Gait Details: significant trunk flexion but pt reports this is baseline   Optometrist     Tilt Bed    Modified Rankin (Stroke Patients Only)       Balance Overall balance assessment: Needs assistance Sitting-balance support: No upper extremity supported Sitting balance-Leahy Scale: Fair     Standing balance support: During functional activity, Bilateral upper extremity supported, Reliant on assistive device for balance Standing balance-Leahy Scale: Fair Standing balance comment: forward flexed posture, cueing for upright posture, reliant on RW for support                            Cognition Arousal: Alert Behavior During Therapy: [facility] for tasks assessed/performed Overall Cognitive Status: Within Functional Limits for tasks assessed                                 General Comments: Pt oriented to location, time, follows commands throughout session.        Exercises Other Exercises Other Exercises: Edu on PLB techniques to  prevent overexertion. Pt states he uses IS and Acapella every hours.    General Comments General comments (skin integrity, edema, etc.):  (99% on 2L O2 at rest, 97% after completing short distance gait with increased fatigue and SOB)      Pertinent Vitals/Pain Pain Assessment Pain Assessment: 0-10 Pain Score: 7  Pain Location: chronic back pain Pain Descriptors / Indicators: Aching Pain Intervention(s): Patient requesting pain meds-RN notified    Home Living                          Prior Function            PT Goals (current  goals can now be found in the care plan section) Acute Rehab PT Goals Patient Stated Goal: get better    Frequency    Min 1X/week      PT Plan      Co-evaluation PT/OT/SLP Co-Evaluation/Treatment: Yes Reason for Co-Treatment: To address functional/ADL transfers;For patient/therapist safety;Other (comment) PT goals addressed during session: Mobility/safety with mobility OT goals addressed during session: ADL's and self-care      AM-PAC PT 6 Clicks Mobility   Outcome Measure  Help needed turning from your back to your side while in a flat bed without using bedrails?: None Help needed moving from lying on your back to sitting on the side of a flat bed without using bedrails?: A Little Help needed moving to and from a bed to a chair (including a wheelchair)?: A Little Help needed standing up from a chair using your arms (e.g., wheelchair or bedside chair)?: A Little Help needed to walk in hospital room?: A Little Help needed climbing 3-5 steps with a railing? : A Little 6 Click Score: 19    End of Session Equipment Utilized During Treatment: Oxygen;Gait belt Activity Tolerance: Patient tolerated treatment well Patient left: in chair;with chair alarm set;with call bell/phone within reach Nurse Communication: Mobility status PT Visit Diagnosis: Muscle weakness (generalized) (M62.81);Difficulty in walking, not elsewhere classified (R26.2);Unsteadiness on feet (R26.81);Other abnormalities of gait and mobility (R26.89)     Time: 8445-8378 PT Time Calculation (min) (ACUTE ONLY): 27 min  Charges:    $Therapeutic Activity: 8-22 mins PT General Charges $$ ACUTE PT VISIT: 1 Visit                    Darice Bohr, PTA  Darice JAYSON Bohr 12/22/2023, 5:13 PM

## 2023-12-22 NOTE — Progress Notes (Signed)
 Progress Note   Patient: Logan Vazquez FMW:[id number] DOB: April 03, 1946 DOA: 12/10/2023     12 DOS: the patient was seen and examined on 12/22/2023   Brief hospital course: 77yo with h/o CAD, AAA s/p repair, COPD, HTN, and chronic back pain who presented with UGI bleeding.  CTA negative.  EGD on 12/26 negative.  Developed SOB with hypoxia; given Lasix  and treated for COPD exacerbation.  Continues to have coarse cough and feel terrible.  Assessment and Plan:  Upper GI bleed CT angio negative for acute bleed but did reveal narrowing in the colon that could represent mass versus underdistention GI consulted EGD 12/26 without acute bleed or evidence of old blood.  Suspected healed Mallory-Weiss tear On PPI now Tolerating diet, no further vomiting   COPD, now with exacerbation Requiring [place] O2; ambulatory pulse ox to 86%, he needs home O2 and this has been ordered Methylprednisolone  -> prednisone  Azithromycin  -> Augmentin  since he is still having ongoing coarse cough Repeat CXR with atelectasis-- incentive spirometry  added with limited improvement Continue Arnuity, Mucinex  Hycodan prn Change Spiriva  to Duonebs q6h + q2h prn albuterol  Negative RVP Chest CT done with evidence of aspiration PNA Will make NPO pending swallow evaluation Continue Augmentin  for now   AKI 1.15 on presentation, currently 1.41 which is improved with IVF Likely from poor PO intake + diuresis Given his aspiration and current need for NPO, will resume IVF at 100 cc/hr x 1 day and also gave a 500 cc bolus this AM   Chronic diastolic CHF Increased interstitial edema on 12/27 CXR Treated with Lasix  40 mg twice daily Echocardiogram: LVEF 60 to 65%, no regional wall motion abnormalities grade 1 diastolic dysfunction. Appears to be compensated at this time   Hyponatremia Chronic and stable during hospitalization Currently normalized Recommend outpatient f/u   Paroxsymal Atrial fibrillation On Eliquis  at home EKG  NSR on arrival.  Hold anticoagulation for now in setting of GI bleed Currently rate controlled, continue metoprolol    Hypertension Continuing home medications - amlodipine , lisinopril , lopressor  Meds were held this AM in the setting of marginal BPs   AAA Status post repair with no evidence of endoleak on CT   CAD involving native coronary artery of native heart without angina Baseline nonobstructive CAD Continue Plavix    History of ischemic stroke History of multiple prior strokes in the past with chronic lower extremity weakness Continue statin, Plavix    Tobacco dependence Endorses quarter pack per day smoker Cessation counseling provided Nicotine  patch for now   Chronic Back pain Continue TID tramadol  and pregabalin , Baclofen   Mood d/o Continue fluoxetine , melatonin, trazodone         Consultants: GI PT OT SLP TOC team   Procedures: EGD 12/26 Echocardiogram 12/28   Antibiotics: Azithromycin  12/31-1/2 Augmentin  1/2-   30 Day Unplanned Readmission Risk Score    Flowsheet Row ED to Hosp-Admission (Current) from 12/10/2023 in [facility] 1C MEDICAL TELEMETRY  30 Day Unplanned Readmission Risk Score (%) 26.74 Filed at 12/22/2023 0401       This score is the patient's risk of an unplanned readmission within 30 days of being discharged (0 -100%). The score is based on dignosis, age, lab data, medications, orders, and past utilization.   Low:  0-14.9   Medium: 15-21.9   High: 22-29.9   Extreme: 30 and above           Subjective: Still with coarse cough and feeling worse than baseline.   Objective: Vitals:  12/22/23 0529 12/22/23 0724  BP: (!) 100/52 (!) 98/50  Pulse: (!) 57 (!) 55  Resp: 18 16  Temp: 98 F (36.7 C) 97.7 F (36.5 C)  SpO2: 91% 93%    Intake/Output Summary (Last 24 hours) at 12/22/2023 1610 Last data filed at 12/22/2023 9177 Gross per 24 hour  Intake --  Output 300 ml  Net -300 ml   Filed Weights    12/11/23 1124 12/11/23 2100  Weight: 54.9 kg 54.9 kg    Exam:  General:  Appears calm and comfortable and is in NAD, chronically ill-appearing, on 2L [place] O2 Eyes:   EOMI, normal lids, iris ENT:  grossly normal hearing, lips & tongue, mmm Neck:  no LAD, masses or thyromegaly Cardiovascular:  RRR, no m/r/g. No LE edema.  Respiratory:  Less but ongoing rhonchi with wheezing.  Normal respiratory effort on [place] O2. Abdomen:  soft, NT, ND Skin:  no rash or induration seen on limited exam Musculoskeletal:  grossly normal tone BUE/BLE, good ROM, no bony abnormality Psychiatric:  blunted mood and affect, speech fluent and appropriate, AOx3 Neurologic:  CN 2-12 grossly intact, moves all extremities in coordinated fashion  Data Reviewed: I have reviewed the patient's lab results since admission.  Pertinent labs for today include:   BUN 34/Creatinine 1.41/GFR 51; 27/1.09/>60 on 12/26 WBC 18.2 Hgb 10.8 RVP negative     Family Communication: None present  Disposition: Status is: Inpatient Remains inpatient appropriate because: ongoing evaluation and management     Time spent: 50 minutes  Unresulted Labs (From admission, onward)     Start     Ordered   12/23/23 0500  CBC with Differential/Platelet  Tomorrow morning,   R       Question:  Specimen collection method  Answer:  Lab=Lab collect   12/22/23 0726   12/23/23 0500  Basic metabolic panel  Tomorrow morning,   R       Question:  Specimen collection method  Answer:  Lab=Lab collect   12/22/23 9273             Author: Delon Herald, MD 12/22/2023 4:10 PM  For on call review [email or web address].

## 2023-12-22 NOTE — Plan of Care (Signed)
  Problem: Education: Goal: Knowledge of General Education information will improve Description: Including pain rating scale, medication(s)/side effects and non-pharmacologic comfort measures Outcome: Progressing   Problem: Health Behavior/Discharge Planning: Goal: Ability to manage health-related needs will improve Outcome: Progressing   Problem: Clinical Measurements: Goal: Ability to maintain clinical measurements within normal limits will improve Outcome: Progressing Goal: Will remain free from infection Outcome: Progressing Goal: Diagnostic test results will improve Outcome: Progressing   Problem: Activity: Goal: Risk for activity intolerance will decrease Outcome: Progressing   Problem: Nutrition: Goal: Adequate nutrition will be maintained Outcome: Progressing   Problem: Coping: Goal: Level of anxiety will decrease Outcome: Progressing   Problem: Pain Management: Goal: General experience of comfort will improve Outcome: Progressing   Problem: Safety: Goal: Ability to remain free from injury will improve Outcome: Progressing   Problem: Skin Integrity: Goal: Risk for impaired skin integrity will decrease Outcome: Progressing

## 2023-12-23 DIAGNOSIS — K922 Gastrointestinal hemorrhage, unspecified: Secondary | ICD-10-CM | POA: Diagnosis not present

## 2023-12-23 LAB — CBC WITH DIFFERENTIAL/PLATELET
Abs Immature Granulocytes: 0.21 10*3/uL — ABNORMAL HIGH (ref 0.00–0.07)
Basophils Absolute: 0 10*3/uL (ref 0.0–0.1)
Basophils Relative: 0 %
Eosinophils Absolute: 0 10*3/uL (ref 0.0–0.5)
Eosinophils Relative: 0 %
HCT: 30.4 % — ABNORMAL LOW (ref 39.0–52.0)
Hemoglobin: 9.9 g/dL — ABNORMAL LOW (ref 13.0–17.0)
Immature Granulocytes: 2 %
Lymphocytes Relative: 17 %
Lymphs Abs: 2.2 10*3/uL (ref 0.7–4.0)
MCH: 30.9 pg (ref 26.0–34.0)
MCHC: 32.6 g/dL (ref 30.0–36.0)
MCV: 95 fL (ref 80.0–100.0)
Monocytes Absolute: 0.7 10*3/uL (ref 0.1–1.0)
Monocytes Relative: 5 %
Neutro Abs: 9.8 10*3/uL — ABNORMAL HIGH (ref 1.7–7.7)
Neutrophils Relative %: 76 %
Platelets: 414 10*3/uL — ABNORMAL HIGH (ref 150–400)
RBC: 3.2 MIL/uL — ABNORMAL LOW (ref 4.22–5.81)
RDW: 17.1 % — ABNORMAL HIGH (ref 11.5–15.5)
WBC: 12.9 10*3/uL — ABNORMAL HIGH (ref 4.0–10.5)
nRBC: 0 % (ref 0.0–0.2)

## 2023-12-23 LAB — BASIC METABOLIC PANEL
Anion gap: 9 (ref 5–15)
BUN: 25 mg/dL — ABNORMAL HIGH (ref 8–23)
CO2: 25 mmol/L (ref 22–32)
Calcium: 8 mg/dL — ABNORMAL LOW (ref 8.9–10.3)
Chloride: 101 mmol/L (ref 98–111)
Creatinine, Ser: 1.01 mg/dL (ref 0.61–1.24)
GFR, Estimated: >60 mL/min (ref >60)
Glucose, Bld: 97 mg/dL (ref 70–99)
Potassium: 4.7 mmol/L (ref 3.5–5.1)
Sodium: 135 mmol/L (ref 135–145)

## 2023-12-23 MED ORDER — AMOXICILLIN-POT CLAVULANATE 875-125 MG PO TABS: 1.0000 | ORAL_TABLET | Freq: Two times a day (BID) | ORAL | Status: DC

## 2023-12-23 MED ORDER — METOPROLOL TARTRATE 25 MG PO TABS: 12.5000 mg | ORAL_TABLET | Freq: Every day | ORAL | Status: DC

## 2023-12-23 MED ORDER — AMOXICILLIN-POT CLAVULANATE 875-125 MG PO TABS: 1.0000 | ORAL_TABLET | Freq: Two times a day (BID) | ORAL | 0 refills | Status: AC

## 2023-12-23 MED ORDER — GUAIFENESIN ER 600 MG PO TB12: 600.0000 mg | ORAL_TABLET | Freq: Two times a day (BID) | ORAL | Status: DC | PRN

## 2023-12-23 MED ORDER — LISINOPRIL 20 MG PO TABS: 20.0000 mg | ORAL_TABLET | Freq: Every day | ORAL | 1 refills | Status: DC

## 2023-12-23 MED ORDER — IPRATROPIUM-ALBUTEROL 0.5-2.5 (3) MG/3ML IN SOLN: 3.0000 mL | Freq: Two times a day (BID) | RESPIRATORY_TRACT | Status: DC

## 2023-12-23 MED ORDER — GUAIFENESIN ER 600 MG PO TB12: 600.0000 mg | ORAL_TABLET | Freq: Two times a day (BID) | ORAL | 0 refills | Status: DC | PRN

## 2023-12-23 MED ORDER — LISINOPRIL 20 MG PO TABS: 20.0000 mg | ORAL_TABLET | Freq: Every day | ORAL | Status: DC

## 2023-12-23 MED ORDER — NICOTINE 7 MG/24HR TD PT24: 7.0000 mg | MEDICATED_PATCH | Freq: Every day | TRANSDERMAL | Status: DC

## 2023-12-23 MED ORDER — PREDNISONE 20 MG PO TABS: 40.0000 mg | ORAL_TABLET | Freq: Every day | ORAL | Status: DC

## 2023-12-23 MED ORDER — PREDNISONE 20 MG PO TABS: 40.0000 mg | ORAL_TABLET | Freq: Every day | ORAL | 0 refills | Status: AC

## 2023-12-23 MED ORDER — NICOTINE 7 MG/24HR TD PT24: 7.0000 mg | MEDICATED_PATCH | Freq: Every day | TRANSDERMAL | 1 refills | Status: DC

## 2023-12-23 NOTE — Discharge Summary (Addendum)
 Physician Discharge Summary   Patient: Logan Vazquez MRN: [id number] DOB: November 21, 1946  Admit date:     12/10/2023  Discharge date: 12/23/23  Discharge Physician: Delon Herald   PCP: Kandis Stefano Iles, MD   Recommendations at discharge:   Complete antibiotics (Augmentin  twice daily until gone) and prednisone  Speech therapy recommends regular diet with thin liquids with minimal environmental distraction, small sips/bites with rest breaks prn, and seated upright at 90 degrees during and for 60-90 minutes after meals) Stop smoking!  Patch provided Stop Norvasc  (amlodipine ) Decrease lisinopril  dose to 20 mg daily You are being referred for lung cancer screening Home health is ordered  Discharge Diagnoses: Principal Problem:   Upper GI bleed Active Problems:   Atrial fibrillation (HCC)   Essential hypertension   Abdominal aortic aneurysm (AAA) (HCC)   COPD (chronic obstructive pulmonary disease) (HCC)   Tobacco dependence   Ischemic stroke (HCC)   AKI (acute kidney injury) (HCC)   Coronary artery disease involving native coronary artery of native heart without angina pectoris    Hospital Course: 78yo with h/o CAD, AAA s/p repair, COPD, HTN, and chronic back pain who presented with UGI bleeding.  CTA negative.  EGD on 12/26 negative.  Developed SOB with hypoxia; given Lasix  and treated for COPD exacerbation.  Continues to have coarse cough and feel terrible.  Assessment and Plan:  Upper GI bleed CT angio negative for acute bleed but did reveal narrowing in the colon that could represent mass versus underdistention GI consulted EGD 12/26 without acute bleed or evidence of old blood.  Suspected healed Mallory-Weiss tear On PPI now Tolerating diet, no further vomiting   COPD, now with exacerbation Requiring [place] O2; ambulatory pulse ox to 86%, he needs home O2 and this has been ordered Methylprednisolone  -> prednisone  Azithromycin  -> Augmentin  since he is still having ongoing  coarse cough Repeat CXR with atelectasis-- incentive spirometry  added with limited improvement Continue Arnuity, Mucinex  Hycodan prn Change Spiriva  to Duonebs q6h + q2h prn albuterol  Negative RVP Chest CT done with evidence of aspiration PNA SLP evaluation completed and he is at mild aspiration risk with swallowing modifications recommended Continue Augmentin  for now   AKI 1.15 on presentation, currently 1.41 which is improved with IVF Likely from poor PO intake + diuresis Given his aspiration and current need for NPO, will resume IVF at 100 cc/hr x 1 day and also gave a 500 cc bolus this AM   Chronic diastolic CHF Increased interstitial edema on 12/27 CXR Treated with Lasix  40 mg twice daily Echocardiogram: LVEF 60 to 65%, no regional wall motion abnormalities grade 1 diastolic dysfunction. Appears to be compensated at this time   Hyponatremia Chronic and stable during hospitalization Currently normalized Recommend outpatient f/u   Paroxsymal Atrial fibrillation On Eliquis  at home EKG NSR on arrival.  Hold anticoagulation for now in setting of GI bleed Currently rate controlled, continue metoprolol    Hypertension Continuing home medications -  lisinopril  (dose changed to 20 mg daily), lopressor  Stop amlodipine  due to marginal BPs (can add back if needed)   AAA Status post repair with no evidence of endoleak on CT   CAD involving native coronary artery of native heart without angina Baseline nonobstructive CAD Continue Plavix    History of ischemic stroke History of multiple prior strokes in the past with chronic lower extremity weakness Continue statin, Plavix    Tobacco dependence Endorses quarter pack per day smoker Cessation counseling provided Nicotine  patch for now   Chronic Back pain  Continue TID tramadol  and pregabalin , Baclofen   Mood d/o Continue fluoxetine , melatonin, trazodone   Pressure ulcer Pressure Injury 12/22/23 Sacrum Mid;Lower Stage 1 -   Intact skin with non-blanchable redness of a localized area usually over a bony prominence. redness blanchable (Active)  12/22/23 1500  Location: Sacrum  Location Orientation: Mid;Lower  Staging: Stage 1 -  Intact skin with non-blanchable redness of a localized area usually over a bony prominence.  Wound Description (Comments): redness blanchable  Present on Admission:   Not present on admission          Consultants: GI PT OT SLP TOC team   Procedures: EGD 12/26 Echocardiogram 12/28   Antibiotics: Azithromycin  12/31-1/2 Augmentin  1/2-   Pain control - [place]  Controlled Substance Reporting System database was reviewed. and patient was instructed, not to drive, operate heavy machinery, perform activities at heights, swimming or participation in water activities or provide baby-sitting services while on Pain, Sleep and Anxiety Medications; until their outpatient Physician has advised to do so again. Also recommended to not to take more than prescribed Pain, Sleep and Anxiety Medications.   Disposition: Assisted living Diet recommendation:  Regular diet DISCHARGE MEDICATION: Allergies as of 12/23/2023       Reactions   Ibuprofen Nausea And Vomiting        Medication List     STOP taking these medications    amLODipine  10 MG tablet Commonly known as: NORVASC        TAKE these medications    acetaminophen  325 MG tablet Commonly known as: TYLENOL  Take by mouth.   albuterol  108 (90 Base) MCG/ACT inhaler Commonly known as: VENTOLIN  HFA Inhale 1-2 puffs into the lungs every 4 (four) hours as needed for wheezing or shortness of breath.   amoxicillin -clavulanate 875-125 MG tablet Commonly known as: AUGMENTIN  Take 1 tablet by mouth every 12 (twelve) hours for 2 days.   Anusol -HC 25 MG suppository Generic drug: hydrocortisone  Place 25 mg rectally 2 (two) times daily.   Arnuity Ellipta 100 MCG/ACT Aepb Generic drug: Fluticasone Furoate Inhale 2 puffs  into the lungs in the morning and at bedtime.   atorvastatin  40 MG tablet Commonly known as: LIPITOR Take 1 tablet (40 mg total) by mouth daily.   B-12 2000 MCG Tabs Take 1 tablet by mouth daily.   Baclofen 5 MG Tabs Take 1 tablet by mouth every 8 (eight) hours as needed.   busPIRone  15 MG tablet Commonly known as: BUSPAR  Take 15 mg by mouth 3 (three) times daily.   clopidogrel  75 MG tablet Commonly known as: PLAVIX  Take 75 mg by mouth daily.   ferrous sulfate  325 (65 FE) MG EC tablet Take 325 mg by mouth 3 (three) times daily with meals.   FLUoxetine  20 MG capsule Commonly known as: PROZAC  Take 60 mg by mouth every morning.   guaiFENesin  600 MG 12 hr tablet Commonly known as: MUCINEX  Take 1 tablet (600 mg total) by mouth 2 (two) times daily as needed.   Hemorrhoidal 0.25-14-74.9 % rectal ointment Generic drug: phenylephrine -shark liver oil-mineral oil-petrolatum Place rectally.   lisinopril  20 MG tablet Commonly known as: ZESTRIL  Take 1 tablet (20 mg total) by mouth daily. Start taking on: December 24, 2023 What changed:  medication strength how much to take   loperamide 2 MG capsule Commonly known as: IMODIUM Take 2 mg by mouth as needed for diarrhea or loose stools.   melatonin 5 MG Tabs Take 5 mg by mouth at bedtime.   metoprolol  tartrate 25 MG  tablet Commonly known as: LOPRESSOR  Take 0.5 tablets (12.5 mg total) by mouth daily. Start taking on: December 24, 2023   nicotine  7 mg/24hr patch Commonly known as: NICODERM CQ  - dosed in mg/24 hr Place 1 patch (7 mg total) onto the skin daily. Start taking on: December 24, 2023   nitroGLYCERIN  0.4 MG SL tablet Commonly known as: NITROSTAT  Place 0.4 mg under the tongue every 5 (five) minutes as needed for chest pain.   omeprazole  40 MG capsule Commonly known as: PRILOSEC Take 40 mg by mouth daily.   predniSONE  20 MG tablet Commonly known as: DELTASONE  Take 2 tablets (40 mg total) by mouth daily with  breakfast for 3 days. Start taking on: December 24, 2023   pregabalin  75 MG capsule Commonly known as: LYRICA  Take 75 mg by mouth 3 (three) times daily.   Spiriva  Respimat 1.25 MCG/ACT Aers Generic drug: Tiotropium Bromide  Monohydrate SMARTSIG:2 Puff(s) Via Inhaler Daily   traMADol  50 MG tablet Commonly known as: ULTRAM  Take 50 mg by mouth 3 (three) times daily.   traZODone  50 MG tablet Commonly known as: DESYREL  Take 50 mg by mouth at bedtime.   Vitamin D3 25 MCG (1000 UT) Caps Take 2 capsules by mouth daily.               Durable Medical Equipment  (From admission, onward)           Start     Ordered   12/16/23 1349  For home use only DME oxygen  Once       Question Answer Comment  Length of Need 6 Months   Mode or (Route) Nasal cannula   Liters per Minute 2   Frequency Continuous (stationary and portable oxygen unit needed)   Oxygen conserving device Yes   Oxygen delivery system Gas      12/16/23 1348            Follow-up Information     Kandis Stefano Iles, MD Follow up.   Specialty: Family Medicine Why: [phone] Contact information: [address] [place] [address] [phone]                Discharge Exam:   Subjective: Feeling some better but isn't sure if he is ready to go back yet.  Had speech therapy evaluation and happy to be eating again.   Objective: Vitals:   12/23/23 0752 12/23/23 1503  BP: (!) 147/51 (!) 107/52  Pulse: (!) 53 64  Resp:  20  Temp: (!) 97.5 F (36.4 C) 97.7 F (36.5 C)  SpO2: 97% 98%    Intake/Output Summary (Last 24 hours) at 12/23/2023 1507 Last data filed at 12/23/2023 0649 Gross per 24 hour  Intake --  Output 850 ml  Net -850 ml   Filed Weights   12/11/23 1124 12/11/23 2100  Weight: 54.9 kg 54.9 kg    Exam:  General:  Appears calm and comfortable and is in NAD, chronically ill-appearing, on 2L [place] O2 Eyes:   EOMI, normal lids, iris ENT:  grossly normal  hearing, lips & tongue, mmm Neck:  no LAD, masses or thyromegaly Cardiovascular:  RRR, no m/r/g. No LE edema.  Respiratory:  Improved air movement with scattered rhonchi.  Normal respiratory effort on [place] O2. Abdomen:  soft, NT, ND Skin:  no rash or induration seen on limited exam Musculoskeletal:  grossly normal tone BUE/BLE, good ROM, no bony abnormality Psychiatric:  blunted mood and affect, speech fluent and appropriate, AOx3  Neurologic:  CN 2-12 grossly intact, moves all extremities in coordinated fashion  Data Reviewed: I have reviewed the patient's lab results since admission.  Pertinent labs for today include:  Unremarkable BMP WBC 12.9 Hgb 9.9 Platelets 414     Condition at discharge: fair  The results of significant diagnostics from this hospitalization (including imaging, microbiology, ancillary and laboratory) are listed below for reference.   Imaging Studies: CT CHEST W CONTRAST Result Date: 12/22/2023 CLINICAL DATA:  Dyspnea, chronic, unclear etiology Patient presented with upper GI bleed. Increasing shortness of breath with hypoxia. EXAM: CT CHEST WITH CONTRAST TECHNIQUE: Multidetector CT imaging of the chest was performed during intravenous contrast administration. RADIATION DOSE REDUCTION: This exam was performed according to the departmental dose-optimization program which includes automated exposure control, adjustment of the mA and/or kV according to patient size and/or use of iterative reconstruction technique. CONTRAST:  75mL OMNIPAQUE  IOHEXOL  300 MG/ML  SOLN COMPARISON:  Noncontrast chest CT 02/18/2019. Chest radiographs 12/20/2023 and 12/18/2023. FINDINGS: Cardiovascular: No acute vascular findings are demonstrated. There is atherosclerosis of the aorta, great vessels and coronary arteries. There is central enlargement of the pulmonary arteries suspicious for pulmonary arterial hypertension. The heart size is normal. There is no pericardial effusion. Mediastinum/Nodes:  There are no enlarged mediastinal, hilar or axillary lymph nodes. The thyroid gland, trachea and esophagus demonstrate no significant findings. Lungs/Pleura: Trace pleural effusion on the left. No evidence of pneumothorax. Again demonstrated is mild centrilobular and paraseptal emphysema with progressive subpleural reticulation in both lungs. There are new dependent airspace opacities in both lower lobes, greater on the left associated with diffuse central airway thickening and areas of mucous plugging. Upper abdomen: No acute findings are seen in the visualized upper abdomen. There is a stable cyst in the interpolar region of the left kidney for which no specific follow-up imaging is recommended. Partially imaged abdominal aortic stent graft. Previous cholecystectomy. Musculoskeletal/Chest wall: There is no chest wall mass or suspicious osseous finding. Chronic T10 compression deformity, unchanged from abdominal CTA 06/08/2022. IMPRESSION: 1. New dependent airspace opacities in both lower lobes, greater on the left, associated with diffuse central airway thickening and areas of mucous plugging. Findings are suspicious for aspiration pneumonia. 2. Trace left pleural effusion. 3. Central enlargement of the pulmonary arteries suspicious for pulmonary arterial hypertension. 4. Aortic Atherosclerosis (ICD10-I70.0) and Emphysema (ICD10-J43.9). Electronically Signed   By: Elsie Perone M.D.   On: 12/22/2023 13:51   DG Chest Port 1 View Result Date: 12/20/2023 CLINICAL DATA:  Dyspnea EXAM: PORTABLE CHEST 1 VIEW COMPARISON:  X-ray 12/18/2023 FINDINGS: Tiny pleural effusions. Enlarged cardiopericardial silhouette with calcified aorta. Interstitial changes. No pneumothorax or separate consolidation. Fullness of the mediastinum and hilum. Unchanged from prior. IMPRESSION: No significant interval change when adjusting for technique. Electronically Signed   By: Ranell Bring M.D.   On: 12/20/2023 12:29   DG Chest Port 1  View Result Date: 12/18/2023 CLINICAL DATA:  Cough and congestion EXAM: PORTABLE CHEST 1 VIEW COMPARISON:  12/12/2023 FINDINGS: Cardiac shadow is prominent. Aortic calcifications are again seen. Left basilar atelectasis is noted. No sizable effusion is seen. No bony abnormality is noted. IMPRESSION: Basilar atelectasis. Electronically Signed   By: Oneil Devonshire M.D.   On: 12/18/2023 19:10   ECHOCARDIOGRAM COMPLETE Result Date: 12/13/2023    ECHOCARDIOGRAM REPORT   Patient Name:   PATRICK SALEMI Date of Exam: 12/13/2023 Medical Rec #:  [id number]      Height:       69.0 in Accession #:  [id number]     Weight:       121.0 lb Date of Birth:  [birth year]       BSA:          1.669 m Patient Age:    85 years       BP:           137/62 mmHg Patient Gender: M              HR:           80 bpm. Exam Location:  [facility] Procedure: 2D Echo, Cardiac Doppler and Color Doppler Indications:     Other abnormalities of the heart R00.8  History:         Patient has prior history of Echocardiogram examinations, most                  recent 03/15/2022. CAD and Angina, Stroke and COPD,                  Arrythmias:Atrial Fibrillation, Signs/Symptoms:Chest Pain and                  Syncope; Risk Factors:Hypertension and Current Smoker.  Sonographer:     Thea Norlander RCS Referring Phys:  [id number] LORANE POLAND Diagnosing Phys: Annabella Scarce MD IMPRESSIONS  1. Left ventricular ejection fraction, by estimation, is 60 to 65%. The left ventricle has normal function. The left ventricle has no regional wall motion abnormalities. Left ventricular diastolic parameters are consistent with Grade I diastolic dysfunction (impaired relaxation).  2. Right ventricular systolic function is normal. The right ventricular size is normal. There is normal pulmonary artery systolic pressure.  3. Left atrial size was moderately dilated.  4. The mitral valve is normal in structure. Trivial mitral valve regurgitation. No evidence of mitral stenosis.  5. The  aortic valve is tricuspid. Aortic valve regurgitation is moderate. No aortic stenosis is present. Aortic regurgitation PHT measures 547 msec.  6. Pulmonic valve regurgitation is moderate.  7. Aortic dilatation noted. There is mild dilatation of the aortic root, measuring 42 mm. There is mild dilatation of the ascending aorta, measuring 40 mm.  8. The inferior vena cava is normal in size with <50% respiratory variability, suggesting right atrial pressure of 8 mmHg. FINDINGS  Left Ventricle: Left ventricular ejection fraction, by estimation, is 60 to 65%. The left ventricle has normal function. The left ventricle has no regional wall motion abnormalities. The left ventricular internal cavity size was normal in size. There is  no left ventricular hypertrophy of the septal segment. Left ventricular diastolic parameters are consistent with Grade I diastolic dysfunction (impaired relaxation). Normal left ventricular filling pressure. Right Ventricle: The right ventricular size is normal. No increase in right ventricular wall thickness. Right ventricular systolic function is normal. There is normal pulmonary artery systolic pressure. The tricuspid regurgitant velocity is 2.09 m/s, and  with an assumed right atrial pressure of 8 mmHg, the estimated right ventricular systolic pressure is 25.5 mmHg. Left Atrium: Left atrial size was moderately dilated. Right Atrium: Right atrial size was normal in size. Pericardium: There is no evidence of pericardial effusion. Mitral Valve: The mitral valve is normal in structure. Trivial mitral valve regurgitation. No evidence of mitral valve stenosis. Tricuspid Valve: The tricuspid valve is normal in structure. Tricuspid valve regurgitation is not demonstrated. No evidence of tricuspid stenosis. Aortic Valve: The aortic valve is tricuspid. Aortic valve regurgitation is moderate. Aortic regurgitation PHT measures 547 msec. No aortic stenosis is present.  Aortic valve peak gradient measures  11.1 mmHg. Pulmonic Valve: The pulmonic valve was normal in structure. Pulmonic valve regurgitation is moderate. No evidence of pulmonic stenosis. Aorta: Aortic dilatation noted. There is mild dilatation of the aortic root, measuring 42 mm. There is mild dilatation of the ascending aorta, measuring 40 mm. Venous: The inferior vena cava is normal in size with less than 50% respiratory variability, suggesting right atrial pressure of 8 mmHg. IAS/Shunts: No atrial level shunt detected by color flow Doppler.  LEFT VENTRICLE PLAX 2D LVIDd:         4.10 cm   Diastology LVIDs:         3.00 cm   LV e' medial:    10.10 cm/s LV PW:         0.70 cm   LV E/e' medial:  6.7 LV IVS:        1.19 cm   LV e' lateral:   10.70 cm/s LVOT diam:     2.30 cm   LV E/e' lateral: 6.4 LV SV:         96 LV SV Index:   58 LVOT Area:     4.15 cm  RIGHT VENTRICLE             IVC RV S prime:     20.00 cm/s  IVC diam: 2.20 cm TAPSE (M-mode): 2.2 cm LEFT ATRIUM             Index        RIGHT ATRIUM           Index LA diam:        3.40 cm 2.04 cm/m   RA Area:     10.50 cm LA Vol (A2C):   35.9 ml 21.51 ml/m  RA Volume:   16.80 ml  10.07 ml/m LA Vol (A4C):   59.8 ml 35.83 ml/m LA Biplane Vol: 50.0 ml 29.96 ml/m  AORTIC VALVE                 PULMONIC VALVE AV Area (Vmax): 3.29 cm     PR End Diast Vel: 12.67 msec AV Vmax:        166.50 cm/s AV Peak Grad:   11.1 mmHg LVOT Vmax:      132.00 cm/s LVOT Vmean:     80.900 cm/s LVOT VTI:       0.232 m AI PHT:         547 msec  AORTA Ao Root diam: 4.20 cm Ao Asc diam:  4.00 cm MITRAL VALVE                TRICUSPID VALVE MV Area (PHT): 3.85 cm     TR Peak grad:   17.5 mmHg MV Decel Time: 197 msec     TR Vmax:        209.00 cm/s MR Peak grad: 39.4 mmHg MR Vmax:      314.00 cm/s   SHUNTS MV E velocity: 68.10 cm/s   Systemic VTI:  0.23 m MV A velocity: 108.00 cm/s  Systemic Diam: 2.30 cm MV E/A ratio:  0.63 Annabella Scarce MD Electronically signed by Annabella Scarce MD Signature Date/Time:  12/13/2023/5:33:47 PM    Final    DG Chest Port 1 View Result Date: 12/12/2023 CLINICAL DATA:  Worsening hypoxia. EXAM: PORTABLE CHEST 1 VIEW COMPARISON:  12/10/2023 FINDINGS: Stable cardiomegaly. Increased diffuse interstitial infiltrates, consistent with interstitial edema. New small left pleural effusion and left basilar atelectasis versus infiltrate. IMPRESSION:  Increased diffuse interstitial edema. New small left pleural effusion with left basilar atelectasis versus infiltrate. Electronically Signed   By: Norleen DELENA Kil M.D.   On: 12/12/2023 11:05   CT ANGIO GI BLEED Result Date: 12/10/2023 CLINICAL DATA:  Lower GI bleeding, hematemesis * Tracking Code: BO * EXAM: CTA ABDOMEN AND PELVIS WITHOUT AND WITH CONTRAST TECHNIQUE: Multidetector CT imaging of the abdomen and pelvis was performed using the standard protocol during bolus administration of intravenous contrast. Multiplanar reconstructed images and MIPs were obtained and reviewed to evaluate the vascular anatomy. RADIATION DOSE REDUCTION: This exam was performed according to the departmental dose-optimization program which includes automated exposure control, adjustment of the mA and/or kV according to patient size and/or use of iterative reconstruction technique. CONTRAST:  80mL OMNIPAQUE  IOHEXOL  350 MG/ML SOLN COMPARISON:  06/08/2022 FINDINGS: VASCULAR Redemonstrated aortobiiliac stent endograft repair of an infrarenal abdominal aortic aneurysm excluded aneurysm sac slightly diminished in size, measuring 5.6 x 5.6 cm in caliber, previously 6.0 x 5.9 cm (series 9, image 41). No evidence of endoleak. Review of the MIP images confirms the above findings. NON-VASCULAR Lower Chest: No acute findings. Bibasilar fibrosis. Elevation of the left hemidiaphragm. Coronary artery calcifications. Small hiatal hernia. Hepatobiliary: No focal liver abnormality is seen. Status post cholecystectomy. No biliary dilatation. Pancreas: Unremarkable. No pancreatic ductal  dilatation or surrounding inflammatory changes. Spleen: Normal in size without significant abnormality. Adrenals/Urinary Tract: Adrenal glands are unremarkable. Simple, benign left renal cortical cysts, for which no further follow-up or characterization is required. Kidneys are otherwise normal, without renal calculi, solid lesion, or hydronephrosis. Bladder is unremarkable. Stomach/Bowel: Stomach is within normal limits. Appendix appears normal. Masslike narrowing and circumferential wall thickening of the mid ascending colon, a segment approximately 3 cm in length (series 9, image 33). Lymphatic: No enlarged abdominal or pelvic lymph nodes. Reproductive: Prostatomegaly. Other: No abdominal wall hernia or abnormality. No ascites. Musculoskeletal: No acute osseous findings. IMPRESSION: 1. No intraluminal contrast extravasation or other findings to specifically localize GI bleeding. 2. Masslike narrowing and circumferential wall thickening of the mid ascending colon, a segment approximately 3 cm in length. Appearance is concerning for colon malignancy, however could reflect peristalsis. Consider repeat CT including oral enteric contrast or colonoscopy to further evaluate in the setting of otherwise unexplained GI bleeding. 3. Redemonstrated aortobiiliac stent endograft repair of an infrarenal abdominal aortic aneurysm, excluded aneurysm sac slightly diminished in size, measuring 5.6 x 5.6 cm in caliber, previously 6.0 x 5.9 cm. No evidence of endoleak. 4. Prostatomegaly. 5. Coronary artery disease. Aortic Atherosclerosis (ICD10-I70.0). Electronically Signed   By: Marolyn JONETTA Jaksch M.D.   On: 12/10/2023 12:47   DG Chest Portable 1 View Result Date: 12/10/2023 CLINICAL DATA:  Vomiting.  Upper abdominal pain. EXAM: PORTABLE CHEST 1 VIEW COMPARISON:  06/10/2022. FINDINGS: Redemonstration of left retrocardiac airspace opacity obscuring the left hemidiaphragm, descending thoracic aorta and blunting the left lateral  costophrenic angle suggesting combination of left lung atelectasis and/or consolidation with pleural effusion. No significant interval change since the prior study. There is mild pulmonary vascular congestion. Bilateral lung fields are otherwise clear. No dense consolidation or lung collapse. Right lateral costophrenic angle is clear. Stable cardio-mediastinal silhouette. No acute osseous abnormalities. The soft tissues are within normal limits. No free air under the domes of diaphragm. IMPRESSION: *Mild pulmonary vascular congestion. Left retrocardiac opacity, as described above. Electronically Signed   By: Ree Molt M.D.   On: 12/10/2023 12:15    Microbiology: Results for orders placed or performed during the hospital  encounter of 12/10/23  Resp panel by RT-PCR (RSV, Flu A&B, Covid) Anterior Nasal Swab     Status: None   Collection Time: 12/14/23  2:30 PM   Specimen: Anterior Nasal Swab  Result Value Ref Range Status   SARS Coronavirus 2 by RT PCR NEGATIVE NEGATIVE Final    Comment: (NOTE) SARS-CoV-2 target nucleic acids are NOT DETECTED.  The SARS-CoV-2 RNA is generally detectable in upper respiratory specimens during the acute phase of infection. The lowest concentration of SARS-CoV-2 viral copies this assay can detect is 138 copies/mL. A negative result does not preclude SARS-Cov-2 infection and should not be used as the sole basis for treatment or other patient management decisions. A negative result may occur with  improper specimen collection/handling, submission of specimen other than nasopharyngeal swab, presence of viral mutation(s) within the areas targeted by this assay, and inadequate number of viral copies(<138 copies/mL). A negative result must be combined with clinical observations, patient history, and epidemiological information. The expected result is Negative.  Fact Sheet for Patients:  bloggercourse.com  Fact Sheet for Healthcare  Providers:  seriousbroker.it  This test is no t yet approved or cleared by the [place]  FDA and  has been authorized for detection and/or diagnosis of SARS-CoV-2 by FDA under an Emergency Use Authorization (EUA). This EUA will remain  in effect (meaning this test can be used) for the duration of the COVID-19 declaration under Section 564(b)(1) of the Act, 21 U.S.C.section 360bbb-3(b)(1), unless the authorization is terminated  or revoked sooner.       Influenza A by PCR NEGATIVE NEGATIVE Final   Influenza B by PCR NEGATIVE NEGATIVE Final    Comment: (NOTE) The Xpert Xpress SARS-CoV-2/FLU/RSV plus assay is intended as an aid in the diagnosis of influenza from Nasopharyngeal swab specimens and should not be used as a sole basis for treatment. Nasal washings and aspirates are unacceptable for Xpert Xpress SARS-CoV-2/FLU/RSV testing.  Fact Sheet for Patients: bloggercourse.com  Fact Sheet for Healthcare Providers: seriousbroker.it  This test is not yet approved or cleared by the [place]  FDA and has been authorized for detection and/or diagnosis of SARS-CoV-2 by FDA under an Emergency Use Authorization (EUA). This EUA will remain in effect (meaning this test can be used) for the duration of the COVID-19 declaration under Section 564(b)(1) of the Act, 21 U.S.C. section 360bbb-3(b)(1), unless the authorization is terminated or revoked.     Resp Syncytial Virus by PCR NEGATIVE NEGATIVE Final    Comment: (NOTE) Fact Sheet for Patients: bloggercourse.com  Fact Sheet for Healthcare Providers: seriousbroker.it  This test is not yet approved or cleared by the [place]  FDA and has been authorized for detection and/or diagnosis of SARS-CoV-2 by FDA under an Emergency Use Authorization (EUA). This EUA will remain in effect (meaning this test can be  used) for the duration of the COVID-19 declaration under Section 564(b)(1) of the Act, 21 U.S.C. section 360bbb-3(b)(1), unless the authorization is terminated or revoked.  Performed at [facility], [address], [place], [place] [address]   Urine Culture (for pregnant, neutropenic or urologic patients or patients with an indwelling urinary catheter)     Status: None   Collection Time: 12/19/23  2:45 PM   Specimen: In/Out Cath Urine  Result Value Ref Range Status   Specimen Description   Final    IN/OUT CATH URINE Performed at [facility], [address], [place], [place] [address]    Special Requests   Final  NONE Performed at [facility], [address] [place], [place] [address]    Culture   Final    NO GROWTH Performed at [facility], [address] [place], [place] [address]    Report Status 12/20/2023 FINAL  Final  Respiratory (~20 pathogens) panel by PCR     Status: None   Collection Time: 12/21/23  9:41 AM   Specimen: Nasopharyngeal Swab; Respiratory  Result Value Ref Range Status   Adenovirus NOT DETECTED NOT DETECTED Final   Coronavirus 229E NOT DETECTED NOT DETECTED Final    Comment: (NOTE) The Coronavirus on the Respiratory Panel, DOES NOT test for the novel  Coronavirus (2019 nCoV)    Coronavirus HKU1 NOT DETECTED NOT DETECTED Final   Coronavirus NL63 NOT DETECTED NOT DETECTED Final   Coronavirus OC43 NOT DETECTED NOT DETECTED Final   Metapneumovirus NOT DETECTED NOT DETECTED Final   Rhinovirus / Enterovirus NOT DETECTED NOT DETECTED Final   Influenza A NOT DETECTED NOT DETECTED Final   Influenza B NOT DETECTED NOT DETECTED Final   Parainfluenza Virus 1 NOT DETECTED NOT DETECTED Final   Parainfluenza Virus 2 NOT DETECTED NOT DETECTED Final   Parainfluenza Virus 3 NOT DETECTED NOT DETECTED Final   Parainfluenza Virus 4 NOT DETECTED NOT DETECTED Final   Respiratory Syncytial Virus NOT DETECTED NOT DETECTED Final    Bordetella pertussis NOT DETECTED NOT DETECTED Final   Bordetella Parapertussis NOT DETECTED NOT DETECTED Final   Chlamydophila pneumoniae NOT DETECTED NOT DETECTED Final   Mycoplasma pneumoniae NOT DETECTED NOT DETECTED Final    Comment: Performed at [facility], [address] [place], [place] [address]    Labs: CBC: Recent Labs  Lab 12/17/23 [phone] 12/18/23 0612 12/19/23 0355 12/20/23 0451 12/21/23 0541 12/23/23 0534  WBC 16.4* 20.1* 23.5* 20.4* 18.2* 12.9*  NEUTROABS 13.7* 16.3* 20.4* 17.1*  --  9.8*  HGB 10.5* 11.7* 9.9* 9.7* 10.8* 9.9*  HCT 31.0* 35.1* 29.1* 29.0* 33.5* 30.4*  MCV 90.4 93.1 90.4 92.1 94.9 95.0  PLT 335 407* 390 381 442* 414*   Basic Metabolic Panel: Recent Labs  Lab 12/17/23 0415 12/18/23 0612 12/19/23 0355 12/20/23 0451 12/21/23 0541 12/23/23 0534  NA 133* 131* 133* 133* 135 135  K 3.7 3.5 3.8 3.7 4.1 4.7  CL 95* 93* 97* 99 101 101  CO2 26 27 25 26 27 25   GLUCOSE 130* 105* 133* 171* 99 97  BUN 41* 44* 49* 42* 34* 25*  CREATININE 1.18 1.34* 1.83* 1.49* 1.41* 1.01  CALCIUM  8.0* 7.9* 7.6* 7.7* 8.0* 8.0*  MG 2.5* 2.6* 2.5*  --   --   --   PHOS 3.5 4.0 4.8*  --   --   --    Liver Function Tests: Recent Labs  Lab 12/17/23 0415 12/18/23 0612 12/19/23 0355  AST 37 32 25  ALT 49* 44 36  ALKPHOS 124 113 103  BILITOT 0.5 0.8 0.8  PROT 6.1* 6.3* 5.6*  ALBUMIN 2.8* 2.8* 2.5*   CBG: Recent Labs  Lab 12/20/23 1250  GLUCAP 116*    Discharge time spent: greater than 30 minutes.  Signed: Delon Herald, MD [facility] 12/23/2023
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
## 2023-12-23 NOTE — TOC Transition Note (Signed)
 Transition of Care [facility]) - Discharge Note   Patient Details  Name: Logan Vazquez MRN: [id number] Date of Birth: [birth year]  Transition of Care [facility]) CM/SW Contact:  Glorene Leitzke A Chana Lindstrom, RN Phone Number: 12/23/2023, 4:22 PM   Clinical Narrative:    Chart reviewed.  Patient will be a discharge for today.  I have spoken with Bobetta, Chiropodist at [facility].  She informs me that patient is able to return to the facility today.  Bobetta reports that the nursing team can give report to her number at [phone].    I have placed Fl 2 and Discharge Summary in discharge packet as requested by [facility].    Bobetta informs me that she will transport patient back to the Assisted Living today.    I have informed Mitch with [facility] that patient will need portable o2 tank prior to discharge today.  I have also informed Aertive with [facility] that patient will be a discharge for today.  [facility] will provide patient with home health PT/OT services at the facilities.    I have informed staff nurse of the above information.    Final next level of care: Assisted Living [facility] services following) Barriers to Discharge: No Barriers Identified   Patient Goals and CMS Choice   CMS Medicare.gov Compare Post Acute Care list provided to:: Patient Choice offered to / list presented to : Patient      Discharge Placement              Patient chooses bed at:  ([facility]) Patient to be transferred to facility by: Facility transport   Patient and family notified of of transfer: 12/23/23  Discharge Plan and Services Additional resources added to the After Visit Summary for                            [facility] Arranged: PT, OT HH Agency: [facility] ([facility]) Date HH Agency Contacted: 12/23/23   Representative spoke with at [facility] Agency: [place]  Social Drivers of Health (SDOH) Interventions SDOH Screenings   Food Insecurity: No Food Insecurity  (12/11/2023)  Housing: Unknown (12/11/2023)  Transportation Needs: No Transportation Needs (12/11/2023)  Utilities: Not At Risk (12/11/2023)  Depression (PHQ2-9): Low Risk  (12/04/2021)  Social Connections: Patient Declined (12/17/2023)  Tobacco Use: High Risk (12/11/2023)     Readmission Risk Interventions     No data to display

## 2023-12-23 NOTE — NC FL2 (Signed)
 [place]  MEDICAID FL2 LEVEL OF CARE FORM     IDENTIFICATION  Patient Name: Logan Vazquez Birthdate: 1946-12-11 Sex: male Admission Date (Current Location): 12/10/2023  [place] and [place] Number:  [place] [id number] T Facility and Address:  [facility], [address] [place], [place] [address]      Provider Number: [id number]  Attending Physician Name and Address:  Barbarann Nest, MD  Relative Name and Phone Number:  Toy Lard [phone]    Current Level of Care: Hospital Recommended Level of Care: Assisted Living Facility Prior Approval Number:    Date Approved/Denied:   PASRR Number:    Discharge Plan:  (Assisted Living facility)    Current Diagnoses: Patient Active Problem List   Diagnosis Date Noted   Upper GI bleed 12/10/2023   Trochanteric bursitis of left hip 06/26/2022   Controlled substance agreement signed 06/26/2022   Atrial fibrillation (HCC) 06/11/2022   Vitamin B12 deficiency 06/11/2022   Iron deficiency anemia 06/11/2022   Memory loss 06/10/2022   Lobar pneumonia (HCC) 06/10/2022   Acute urinary retention 06/09/2022   Constipation 06/09/2022   Chest pain 06/08/2022   Abdominal pain 06/08/2022   Weakness 03/25/2022   Insomnia 03/24/2022   Hypokalemia 03/20/2022   Coronary artery disease involving native coronary artery of native heart without angina pectoris    Pre-operative cardiovascular examination    Fall at home, initial encounter 03/14/2022   AKI (acute kidney injury) (HCC) 03/14/2022   Anemia, unspecified 03/14/2022   Rhabdomyolysis 03/13/2022   Syncope and collapse 03/13/2022   Thoracic facet joint syndrome 02/07/2021   Cervical facet joint syndrome 02/07/2021   Spinal stenosis of lumbar region 03/04/2019   Chronic hip pain, left 01/05/2019   Chronic pain syndrome 07/08/2018   Long term current use of opiate analgesic 07/08/2018   Chronic bilateral low back pain with bilateral sciatica 07/08/2018    History of lumbar spinal fusion 02/03/2018   Lumbar degenerative disc disease 02/03/2018   History of CVA (cerebrovascular accident) 12/26/2017   Acquired right foot drop 12/26/2017   Ischemic stroke (HCC) 11/16/2017   Depression, recurrent (HCC) 10/23/2017   COPD (chronic obstructive pulmonary disease) (HCC) 10/23/2017   Tobacco dependence 09/05/2017   Pain medication agreement 12/17/2016   Abdominal aortic aneurysm (AAA) (HCC) 08/28/2016   Descending thoracic aortic aneurysm (HCC) 08/28/2016   Enthesopathy of knee 10/05/2015   Anxiety disorder 07/05/2015   Major depressive disorder, single episode 07/05/2015   Recurrent major depressive disorder, in partial remission (HCC) 07/05/2015   Chronic nausea 06/26/2015   Essential hypertension 06/16/2015   Chronic prescription opiate use 05/25/2015   Stable angina (HCC) 02/22/2015   At high risk for falls 01/20/2015   GERD (gastroesophageal reflux disease) 07/12/2013   Tobacco use disorder 05/14/2013   Back pain 04/15/2013   Atherosclerotic heart disease of native coronary artery without angina pectoris 04/15/2013    Orientation RESPIRATION BLADDER Height & Weight     Self, Time, Situation, Place  O2 Continent Weight: 54.9 kg Height:  5' 9 (175.3 cm)  BEHAVIORAL SYMPTOMS/MOOD NEUROLOGICAL BOWEL NUTRITION STATUS      Continent  (See Discharge Summary)  AMBULATORY STATUS COMMUNICATION OF NEEDS Skin   Extensive Assist Verbally Normal                       Personal Care Assistance Level of Assistance  Bathing, Feeding, Dressing Bathing Assistance: Maximum assistance Feeding assistance: Limited assistance Dressing Assistance: Maximum assistance     Functional Limitations  Info  Sight, Hearing, Speech Sight Info: Adequate Hearing Info: Adequate Speech Info: Adequate    SPECIAL CARE FACTORS FREQUENCY  PT (By licensed PT), OT (By licensed OT)     PT Frequency: 2-3 Weekly OT Frequency: 2-3 times weekly             Contractures Contractures Info: Not present    Additional Factors Info  Code Status, Allergies Code Status Info: Full Code Allergies Info: Ibuprofen           Current Medications (12/23/2023):  This is the current hospital active medication list Current Facility-Administered Medications  Medication Dose Route Frequency Provider Last Rate Last Admin   acetaminophen  (TYLENOL ) tablet 650 mg  650 mg Oral Q6H PRN Dezii, Alexandra, DO       albuterol  (PROVENTIL ) (2.5 MG/3ML) 0.083% nebulizer solution 2.5 mg  2.5 mg Inhalation Q2H PRN Barbarann Nest, MD       amoxicillin -clavulanate (AUGMENTIN ) 875-125 MG per tablet 1 tablet  1 tablet Oral Q12H Barbarann Nest, MD   1 tablet at 12/23/23 0845   atorvastatin  (LIPITOR) tablet 40 mg  40 mg Oral Daily Dezii, Alexandra, DO   40 mg at 12/21/23 2002   budesonide  (PULMICORT ) nebulizer solution 0.25 mg  0.25 mg Nebulization BID Barbarann Nest, MD   0.25 mg at 12/23/23 0734   busPIRone  (BUSPAR ) tablet 15 mg  15 mg Oral TID Dezii, Alexandra, DO   15 mg at 12/23/23 0844   clopidogrel  (PLAVIX ) tablet 75 mg  75 mg Oral Daily Barbarann Nest, MD   75 mg at 12/23/23 0845   FLUoxetine  (PROZAC ) capsule 60 mg  60 mg Oral q morning Dezii, Alexandra, DO   60 mg at 12/23/23 9155   guaiFENesin  (MUCINEX ) 12 hr tablet 600 mg  600 mg Oral BID Barbarann Nest, MD   600 mg at 12/23/23 0845   hydrALAZINE  (APRESOLINE ) injection 10 mg  10 mg Intravenous Q6H PRN Dezii, Alexandra, DO       HYDROcodone  bit-homatropine (HYCODAN) 5-1.5 MG/5ML syrup 5 mL  5 mL Oral Q4H PRN Barbarann Nest, MD   5 mL at 12/23/23 0843   hydrocortisone  (ANUSOL -HC) suppository 25 mg  25 mg Rectal BID Barbarann Nest, MD   25 mg at 12/17/23 9074   influenza vaccine adjuvanted (FLUAD) injection 0.5 mL  0.5 mL Intramuscular Tomorrow-1000 Dezii, Alexandra, DO       ipratropium-albuterol  (DUONEB) 0.5-2.5 (3) MG/3ML nebulizer solution 3 mL  3 mL Nebulization BID Barbarann Nest, MD       lactated  ringers  infusion   Intravenous Continuous Barbarann Nest, MD 100 mL/hr at 12/22/23 2038 New Bag at 12/22/23 2038   lidocaine  (LIDODERM ) 5 % 1 patch  1 patch Transdermal Q24H Jesus America, NP   1 patch at 12/23/23 0035   lisinopril  (ZESTRIL ) tablet 20 mg  20 mg Oral Daily Vann, Jessica U, DO   20 mg at 12/23/23 0845   melatonin tablet 5 mg  5 mg Oral QHS Yates, Jennifer, MD   5 mg at 12/22/23 2217   metoprolol  tartrate (LOPRESSOR ) tablet 12.5 mg  12.5 mg Oral Daily Vann, Jessica U, DO   12.5 mg at 12/21/23 9060   morphine  (PF) 2 MG/ML injection 2 mg  2 mg Intravenous Q4H PRN Dezii, Alexandra, DO   2 mg at 12/23/23 1218   nicotine  (NICODERM CQ  - dosed in mg/24 hr) patch 7 mg  7 mg Transdermal Daily Eldonna Elspeth PARAS, MD   7 mg at 12/23/23 [phone]  ondansetron  (ZOFRAN ) tablet 4 mg  4 mg Oral Q6H PRN Eldonna Elspeth PARAS, MD       Or   ondansetron  (ZOFRAN ) injection 4 mg  4 mg Intravenous Q6H PRN Newton, Steven J, MD       pantoprazole  (PROTONIX ) EC tablet 40 mg  40 mg Oral Daily Barbarann Nest, MD   40 mg at 12/23/23 0844   predniSONE  (DELTASONE ) tablet 40 mg  40 mg Oral Q breakfast Vann, Jessica U, DO   40 mg at 12/23/23 0844   pregabalin  (LYRICA ) capsule 75 mg  75 mg Oral TID Dezii, Alexandra, DO   75 mg at 12/23/23 0845   traMADol  (ULTRAM ) tablet 50 mg  50 mg Oral Q12H PRN Dezii, Alexandra, DO   50 mg at 12/23/23 0845   traZODone  (DESYREL ) tablet 50 mg  50 mg Oral QHS Dezii, Alexandra, DO   50 mg at 12/22/23 2218     Discharge Medications: Please see discharge summary for a list of discharge medications.  Relevant Imaging Results:  Relevant Lab Results:   Additional Information [id number]  Breezy Hertenstein A Jeson Camacho, RN

## 2023-12-23 NOTE — Plan of Care (Signed)
  Problem: Education: Goal: Knowledge of General Education information will improve Description: Including pain rating scale, medication(s)/side effects and non-pharmacologic comfort measures Outcome: Progressing   Problem: Clinical Measurements: Goal: Ability to maintain clinical measurements within normal limits will improve Outcome: Progressing   Problem: Activity: Goal: Risk for activity intolerance will decrease Outcome: Progressing   Problem: Nutrition: Goal: Adequate nutrition will be maintained Outcome: Progressing   Problem: Coping: Goal: Level of anxiety will decrease Outcome: Progressing   Problem: Elimination: Goal: Will not experience complications related to bowel motility Outcome: Progressing   Problem: Pain Management: Goal: General experience of comfort will improve Outcome: Progressing   Problem: Safety: Goal: Ability to remain free from injury will improve Outcome: Progressing   Problem: Skin Integrity: Goal: Risk for impaired skin integrity will decrease Outcome: Progressing

## 2024-05-15 ENCOUNTER — Emergency Department

## 2024-05-15 ENCOUNTER — Emergency Department
Admission: EM | Admit: 2024-05-15 | Discharge: 2024-05-15 | Disposition: A | Discharge status: Skilled Nursing Facility | Attending: Emergency Medicine | Admitting: Emergency Medicine

## 2024-05-15 ENCOUNTER — Other Ambulatory Visit: Payer: Self-pay

## 2024-05-15 DIAGNOSIS — W19XXXA Unspecified fall, initial encounter: Secondary | ICD-10-CM

## 2024-05-15 DIAGNOSIS — W050XXA Fall from non-moving wheelchair, initial encounter: Secondary | ICD-10-CM | POA: Diagnosis not present

## 2024-05-15 DIAGNOSIS — G8929 Other chronic pain: Secondary | ICD-10-CM | POA: Insufficient documentation

## 2024-05-15 DIAGNOSIS — J449 Chronic obstructive pulmonary disease, unspecified: Secondary | ICD-10-CM | POA: Diagnosis not present

## 2024-05-15 DIAGNOSIS — M549 Dorsalgia, unspecified: Secondary | ICD-10-CM | POA: Insufficient documentation

## 2024-05-15 DIAGNOSIS — M542 Cervicalgia: Secondary | ICD-10-CM | POA: Diagnosis present

## 2024-05-15 DIAGNOSIS — M25562 Pain in left knee: Secondary | ICD-10-CM | POA: Diagnosis not present

## 2024-05-15 DIAGNOSIS — I5030 Unspecified diastolic (congestive) heart failure: Secondary | ICD-10-CM | POA: Insufficient documentation

## 2024-05-15 DIAGNOSIS — Z8673 Personal history of transient ischemic attack (TIA), and cerebral infarction without residual deficits: Secondary | ICD-10-CM | POA: Diagnosis not present

## 2024-05-15 DIAGNOSIS — J189 Pneumonia, unspecified organism: Secondary | ICD-10-CM | POA: Diagnosis not present

## 2024-05-15 MED ORDER — ACETAMINOPHEN 500 MG PO TABS
1000.0000 mg | ORAL_TABLET | Freq: Once | ORAL | Status: AC
  Administered 2024-05-15: 1000 mg via ORAL
  Filled 2024-05-15: qty 2

## 2024-05-15 MED ORDER — MORPHINE SULFATE (PF) 4 MG/ML IV SOLN
4.0000 mg | Freq: Once | INTRAVENOUS | Status: AC
  Administered 2024-05-15: 4 mg via INTRAVENOUS
  Filled 2024-05-15: qty 1

## 2024-05-15 MED ORDER — METHYLPREDNISOLONE SODIUM SUCC 125 MG IJ SOLR
125.0000 mg | Freq: Once | INTRAMUSCULAR | Status: AC
  Administered 2024-05-15: 125 mg via INTRAVENOUS
  Filled 2024-05-15: qty 2

## 2024-05-15 MED ORDER — IPRATROPIUM-ALBUTEROL 0.5-2.5 (3) MG/3ML IN SOLN
3.0000 mL | Freq: Once | RESPIRATORY_TRACT | Status: AC
  Administered 2024-05-15: 3 mL via RESPIRATORY_TRACT
  Filled 2024-05-15: qty 3

## 2024-05-15 MED ORDER — LIDOCAINE 5 % EX PTCH
2.0000 | MEDICATED_PATCH | Freq: Once | CUTANEOUS | Status: DC
  Administered 2024-05-15: 2 via TRANSDERMAL
  Filled 2024-05-15: qty 2

## 2024-05-15 MED ORDER — PREDNISONE 50 MG PO TABS: 50.0000 mg | ORAL_TABLET | Freq: Every day | ORAL | 0 refills | Status: AC

## 2024-05-15 MED ORDER — DOXYCYCLINE HYCLATE 100 MG PO TABS
100.0000 mg | ORAL_TABLET | Freq: Once | ORAL | Status: AC
  Administered 2024-05-15: 100 mg via ORAL
  Filled 2024-05-15: qty 1

## 2024-05-15 MED ORDER — METHOCARBAMOL 500 MG PO TABS
500.0000 mg | ORAL_TABLET | Freq: Once | ORAL | Status: AC
  Administered 2024-05-15: 500 mg via ORAL
  Filled 2024-05-15: qty 1

## 2024-05-15 MED ORDER — DOXYCYCLINE HYCLATE 100 MG PO TABS: 100.0000 mg | ORAL_TABLET | Freq: Two times a day (BID) | ORAL | 0 refills | Status: AC

## 2024-05-15 NOTE — ED Provider Notes (Signed)
 [facility] Provider Note    Event Date/Time   First MD Initiated Contact with Patient 05/15/24 1005     (approximate)   History   Fall   HPI  Logan Vazquez is a 78 y.o. male who presents to the ED for evaluation of Fall   Review of medical DC summary from January he was admitted for an upper GI bleed.  History of COPD, diastolic CHF, paroxysmal A-fib on Eliquis , stroke and chronic back pain.  He presents to the ED from a local SNF for evaluation of pain to his back and left knee after a fall that occurred overnight.  He was in a wheelchair at his facility, leaning forward to pick up something but fell out of his wheelchair.  No syncope.  Reports left knee pain and acute on chronic back pain, acute neck pain.  Provided p.o. analgesics at the facility this morning around 7 AM but despite this had poor control pain so presents to the ED for evaluation.   Physical Exam   Triage Vital Signs: ED Triage Vitals  Encounter Vitals Group     BP --      Systolic BP Percentile --      Diastolic BP Percentile --      Pulse Rate 05/15/24 1007 66     Resp 05/15/24 1007 20     Temp 05/15/24 1007 98 F (36.7 C)     Temp Source 05/15/24 1007 Oral     SpO2 05/15/24 1007 98 %     Weight 05/15/24 1004 136 lb (61.7 kg)     Height 05/15/24 1004 5\' 9"  (1.753 m)     Head Circumference --      Peak Flow --      Pain Score 05/15/24 1005 9     Pain Loc --      Pain Education --      Exclude from Growth Chart --     Most recent vital signs: Vitals:   05/15/24 1007 05/15/24 1056  BP: 116/74 97/65  Pulse: 66 63  Resp: 20 18  Temp: 98 F (36.7 C)   SpO2: 98% 98%    General: Awake, no distress.  C-collar in place, pleasant and participatory with exam CV:  Good peripheral perfusion.  Resp:  Normal effort.  Abd:  No distention.  MSK:  No deformity noted.  Tenderness with palpation of the left knee, joint line and patella without evidence of open injury.   Palpation of all 4 extremities without evidence of deformity, tenderness or trauma otherwise. With 1 hand assist he is able to lean forward so I can examine his back.  Reports chronic lower back pain, acute bilateral paraspinal thoracic back pain and acute cervical pain on palpation.  No bony step-offs or bruising or other signs of trauma to the back Neuro:  No focal deficits appreciated. Other:     ED Results / Procedures / Treatments   Labs (all labs ordered are listed, but only abnormal results are displayed) Labs Reviewed - No data to display  EKG Sinus rhythm with a rate of 65 bpm.  Normal axis and intervals without clear signs of acute ischemia.  RADIOLOGY CXR interpreted by me with right basilar atelectasis versus infiltrate Plain film the left knee interpreted by me without evidence of acute pathology CT head interpreted by me without evidence of acute intracranial pathology CT cervical spine interpreted by me without evidence of fracture or dislocation CT thoracic and  lumbar spine interpreted by me without evidence of acute fracture  Official radiology report(s): CT Lumbar Spine Wo Contrast Result Date: 05/15/2024 CLINICAL DATA:  78 year old male status post unwitnessed fall at 0200 hours. EXAM: CT LUMBAR SPINE WITHOUT CONTRAST TECHNIQUE: Multidetector CT imaging of the lumbar spine was performed without intravenous contrast administration. Multiplanar CT image reconstructions were also generated. RADIATION DOSE REDUCTION: This exam was performed according to the departmental dose-optimization program which includes automated exposure control, adjustment of the mA and/or kV according to patient size and/or use of iterative reconstruction technique. COMPARISON:  CT thoracic spine today. CTA Abdomen and Pelvis 12/10/2023. FINDINGS: Segmentation: Sacralized L5 level when numbering from the thoracic spine. Alignment: Chronic dextroconvex scoliosis at the thoracolumbar junction, mild  levoconvex lower lumbar scoliosis. Vertebrae: Chronic osteopenia and chronic postoperative appearing lower lumbar fusion with solid appearing arthrodesis from L3 to the sacrum. Posterior spinal hardware there with metal streak artifact. Posttraumatic or degenerative appearing interbody ankylosis also at T12-L1 is stable. Lumbar vertebrae appear stable since December, intact. Grossly intact visible sacrum. No acute osseous abnormality identified. Paraspinal and other soft tissues: Endovascular treated abdominal aortic aneurysm with bifurcated endograft redemonstrated. This contributes to metal streak artifact at the lumbar levels. Native aneurysm sac appears stable since December. Vascular patency is not evaluated in the absence of IV contrast. Stable other visible noncontrast abdominal viscera. Postoperative changes to the lumbar paraspinal soft tissues. Disc levels: Lumbar ankylosis/fusion except at the L1-L2 and L2-L3 levels. Advanced degeneration at both of those levels with multifactorial spinal and foraminal stenosis appears stable from the December CTA. IMPRESSION: 1. No acute traumatic injury identified in the Lumbar Spine. 2. Chronic Osteopenia, combined operative and degenerative thoracolumbar and lumbosacral spinal ankylosis, and advanced chronic degeneration at the unfused L1-L2 and L2-L3 levels. 3. Grossly stable endovascular treated abdominal aortic aneurysm. Aortic Atherosclerosis (ICD10-I70.0). Aortic aneurysm NOS (ICD10-I71.9). Electronically Signed   By: Marlise Simpers M.D.   On: 05/15/2024 11:06   CT HEAD WO CONTRAST ( ) Result Date: 05/15/2024 EXAM: CT HEAD AND CERVICAL SPINE 05/15/2024 10:46:06 AM TECHNIQUE: CT of the head and cervical spine was performed without the administration of intravenous contrast. Multiplanar reformatted images are provided for review. Automated exposure control, iterative reconstruction, and/or weight based adjustment of the mA/kV was utilized to reduce the radiation dose  to as low as reasonably achievable. COMPARISON: CT head and cervical spine 03/13/2022 CLINICAL HISTORY: Fall, on anti-platelets, eval ICH. Pt to ED for unwitnessed fall around 2am. Was reaching for something while sitting in wheelchair and fell to floor. From [facility], [facility]; Neck, back, head pain. Small abrasion to head with bleeding controlled; Takes thinners, no LOC. FINDINGS: HEAD: BRAIN AND VENTRICLES: Stable background of severe chronic small vessel disease with old perforator infarct along the left thalamocapsular junction. Old cortical infarct along the left superior frontal gyrus. No acute intracranial hemorrhage. No mass effect or midline shift. No abnormal extra-axial fluid collection. Gray-white differentiation is maintained. No hydrocephalus. ORBITS: No acute abnormality. SINUSES AND MASTOIDS: No acute abnormality. SOFT TISSUES AND SKULL: No acute skull fracture. No acute soft tissue abnormality. CERVICAL SPINE: BONES AND ALIGNMENT: No acute fracture or traumatic malalignment. DEGENERATIVE CHANGES: Unchanged multilevel cervical spondylosis, worst at C3-4 and C4-5 where there is at least mild spinal canal stenosis. SOFT TISSUES: No prevertebral edema. VASCULATURE: Atherosclerotic calcifications of the carotid bulbs. IMPRESSION: 1. No acute intracranial abnormality. 2. Stable background of severe chronic small vessel disease with old perforator infarct along the left thalamocapsular junction and old  cortical infarct along the left superior frontal gyrus. 3. No acute fracture or traumatic malalignment of the cervical spine. 4. Unchanged multilevel cervical spondylosis, worst at C3-4 and C4-5 with at least mild spinal canal stenosis. Electronically signed by: Audra Blend MD 05/15/2024 11:00 AM EDT RP Workstation: [id number]   CT Cervical Spine Wo Contrast Result Date: 05/15/2024 EXAM: CT HEAD AND CERVICAL SPINE 05/15/2024 10:46:06 AM TECHNIQUE: CT of the head and cervical spine was  performed without the administration of intravenous contrast. Multiplanar reformatted images are provided for review. Automated exposure control, iterative reconstruction, and/or weight based adjustment of the mA/kV was utilized to reduce the radiation dose to as low as reasonably achievable. COMPARISON: CT head and cervical spine 03/13/2022 CLINICAL HISTORY: Fall, on anti-platelets, eval ICH. Pt to ED for unwitnessed fall around 2am. Was reaching for something while sitting in wheelchair and fell to floor. From [facility], [facility]; Neck, back, head pain. Small abrasion to head with bleeding controlled; Takes thinners, no LOC. FINDINGS: HEAD: BRAIN AND VENTRICLES: Stable background of severe chronic small vessel disease with old perforator infarct along the left thalamocapsular junction. Old cortical infarct along the left superior frontal gyrus. No acute intracranial hemorrhage. No mass effect or midline shift. No abnormal extra-axial fluid collection. Gray-white differentiation is maintained. No hydrocephalus. ORBITS: No acute abnormality. SINUSES AND MASTOIDS: No acute abnormality. SOFT TISSUES AND SKULL: No acute skull fracture. No acute soft tissue abnormality. CERVICAL SPINE: BONES AND ALIGNMENT: No acute fracture or traumatic malalignment. DEGENERATIVE CHANGES: Unchanged multilevel cervical spondylosis, worst at C3-4 and C4-5 where there is at least mild spinal canal stenosis. SOFT TISSUES: No prevertebral edema. VASCULATURE: Atherosclerotic calcifications of the carotid bulbs. IMPRESSION: 1. No acute intracranial abnormality. 2. Stable background of severe chronic small vessel disease with old perforator infarct along the left thalamocapsular junction and old cortical infarct along the left superior frontal gyrus. 3. No acute fracture or traumatic malalignment of the cervical spine. 4. Unchanged multilevel cervical spondylosis, worst at C3-4 and C4-5 with at least mild spinal canal stenosis.  Electronically signed by: Audra Blend MD 05/15/2024 11:00 AM EDT RP Workstation: [id number]   CT Thoracic Spine Wo Contrast Result Date: 05/15/2024 CLINICAL DATA:  78 year old male status post unwitnessed fall at 0200 hours. EXAM: CT THORACIC SPINE WITHOUT CONTRAST TECHNIQUE: Multidetector CT images of the thoracic were obtained using the standard protocol without intravenous contrast. RADIATION DOSE REDUCTION: This exam was performed according to the departmental dose-optimization program which includes automated exposure control, adjustment of the mA and/or kV according to patient size and/or use of iterative reconstruction technique. COMPARISON:  Chest CT 12/22/2023. FINDINGS: Limited cervical spine imaging:  Reported separately today. Thoracic spine segmentation: Normal with hypoplastic ribs at T12 on the January comparison. Alignment: Stable since January. Maintained upper thoracic kyphosis. Mild underlying levoconvex upper thoracic scoliosis. Vertebrae: T12 level not included, visible on the lumbar spine CT today reported separately. Chronic severe T10 compression fracture is stable since January. There is posttraumatic T9-T10 interbody ankylosis. T11 level appears stable and intact. Other thoracic vertebrae appear stable and intact. Questionable mild chronic loss of T3 vertebral body height. No other thoracic interbody ankylosis. No acute osseous abnormality identified. Visible posterior ribs appear intact. Paraspinal and other soft tissues: Visible lungs and mediastinum appear stable since January, chronic lung disease. Calcified aortic atherosclerosis. Chronic cholecystectomy. Chronic left renal simple fluid density upper pole cyst (no follow-up imaging recommended). Thoracic paraspinal soft tissues are within normal limits. Disc levels: Stable mild for age thoracic spine  degeneration. Isolated thoracic spinal stenosis at T10 by CT, is related to compression fracture and retropulsion there.  IMPRESSION: 1. No acute traumatic injury identified in the Thoracic Spine. 2. Severe chronic T10 compression fracture with retropulsion and posttraumatic T9-T10 ankylosis is stable. 3. Chronic lung disease.  Aortic Atherosclerosis (ICD10-I70.0). Electronically Signed   By: Marlise Simpers M.D.   On: 05/15/2024 11:00   DG Knee Complete 4 Views Left Result Date: 05/15/2024 CLINICAL DATA:  Fall. EXAM: LEFT KNEE - COMPLETE 4+ VIEW COMPARISON:  None Available. FINDINGS: No acute fracture. No subluxation or dislocation. Meniscal calcification evident. No evidence for joint effusion. IMPRESSION: 1. No acute bony findings. 2. Meniscal calcification. Electronically Signed   By: Donnal Fusi M.D.   On: 05/15/2024 10:37   DG Chest Portable 1 View Result Date: 05/15/2024 CLINICAL DATA:  Fall with left knee pain. EXAM: PORTABLE CHEST 1 VIEW COMPARISON:  12/20/2023 FINDINGS: Interstitial markings are diffusely coarsened with chronic features. New patchy airspace disease at the right base compatible with atelectasis or infiltrate. Interval improvement in aeration at the left base. The cardio pericardial silhouette is enlarged. Soft tissue fullness again noted right hilar region. Bones are diffusely demineralized. Telemetry leads overlie the chest. IMPRESSION: 1. New patchy airspace disease at the right base compatible with atelectasis or infiltrate. 2. Interval improvement in aeration at the left base. 3. Persistent but stable soft tissue fullness right hilar region. Electronically Signed   By: Donnal Fusi M.D.   On: 05/15/2024 10:36    PROCEDURES and INTERVENTIONS:  .1-3 Lead EKG Interpretation  Performed by: Arline Bennett, MD Authorized by: Arline Bennett, MD     Interpretation: normal     ECG rate:  66   ECG rate assessment: normal     Rhythm: sinus rhythm     Ectopy: none     Conduction: normal     Medications  lidocaine  (LIDODERM ) 5 % 2 patch (2 patches Transdermal Patch Applied 05/15/24 1159)  morphine  (PF) 4  MG/ML injection 4 mg (4 mg Intravenous Given 05/15/24 1021)  ipratropium-albuterol  (DUONEB) 0.5-2.5 (3) MG/3ML nebulizer solution 3 mL (3 mLs Nebulization Given 05/15/24 1201)  doxycycline (VIBRA-TABS) tablet 100 mg (100 mg Oral Given 05/15/24 1155)  methylPREDNISolone  sodium succinate (SOLU-MEDROL ) 125 mg/2 mL injection 125 mg (125 mg Intravenous Given 05/15/24 1156)  acetaminophen  (TYLENOL ) tablet 1,000 mg (1,000 mg Oral Given 05/15/24 1155)  methocarbamol  (ROBAXIN ) tablet 500 mg (500 mg Oral Given 05/15/24 1155)     IMPRESSION / MDM / ASSESSMENT AND PLAN / ED COURSE  I reviewed the triage vital signs and the nursing notes.  Differential diagnosis includes, but is not limited to, patellar dislocation, tibia or femur fracture, muscular spasm, spinal fracture, ICH  {Patient presents with symptoms of an acute illness or injury that is potentially life-threatening.  Pleasant 78 year old from SNF presents with pain after mechanical fall that occurred overnight.  No indications for serum workup at this point.  Will provide analgesics, obtain imaging and reassess.  Clinical Course as of 05/15/24 1250  Sat May 15, 2024  1123 Reassessed and discussed workup with reassuring imaging of the head and spine, reassuring imaging of the knee but x-ray findings of the basilar infiltrate versus atelectasis.  With further questioning he does report he has had an increased cough over the past week but no shortness of breath, chest pain.  Reports he did not think much of it.  We discussed starting antibiotics and I reexamined his lungs and he does have some  faint end expiratory wheezes.  Will provide a DuoNeb, start him on a burst of steroids, doxycycline.  We discussed plan of care and disposition and he is comfortable going back to his facility.  Does not want to be admitted.  Furthermore, I offered to clear his collar due to reassuring C-spine imaging but he declines requesting to keep the collar in place. [DS]   1244 Reassessed.  Patient reports feeling much better after breathing treatment.  Clear lungs now.  We discussed plan of care and he is happy to go home, again asking to bring the cervical collar with him. [DS]    Clinical Course User Index [DS] Arline Bennett, MD     FINAL CLINICAL IMPRESSION(S) / ED DIAGNOSES   Final diagnoses:  Fall, initial encounter  Neck pain  Community acquired pneumonia of right lower lobe of lung     Rx / DC Orders   ED Discharge Orders          Ordered    doxycycline (VIBRA-TABS) 100 MG tablet  2 times daily        05/15/24 1243    predniSONE  (DELTASONE ) 50 MG tablet  Daily        05/15/24 1243             Note:  This document was prepared using Dragon voice recognition software and may include unintentional dictation errors.   Arline Bennett, MD 05/15/24 1250

## 2024-05-15 NOTE — ED Notes (Signed)
 LM for pt's sister Anise Kerns at number on file, no pt information shared in VM.

## 2024-05-15 NOTE — ED Triage Notes (Signed)
 Pt to ED for unwitnessed fall around 2am. Was reaching for something while sitting in wheelchair and fell to floor. From [facility], [facility] Neck, back, head pain. Small abrasion to head with bldg controlled Takes thinners, no LOC  Was given oxycodone  and morphine  at 7am this AM at facility Placed on 2L by EMS, in cervical collar  116/54, 62 HR, 98% on 2L, etco2 32, 98.3 oral  Number to call for transport back to facility is  [facility] [phone] OR [place] [phone]

## 2024-05-15 NOTE — Progress Notes (Signed)
 [employer], [employer] [facility]) Hospital Liaison Note  Logan Vazquez is a current hospice patient followed by [facility] at [facility] in [place] for terminal diagnosis of abnormal weigh loss.  Received report from [facility] after hours nurse that patient had a fall earlier at the facility and was experiencing pain in his leg, neck and head.  Hospital liaison team will follow through final disposition.  Please do not hesitate to call or epic chat if needed.  Ambrosio Junker, MA, BSN, RN, FNE Clinical Nurse Liaison [phone]

## 2024-05-15 NOTE — ED Notes (Signed)
 [facility] called at 1:08

## 2024-05-15 NOTE — Discharge Instructions (Addendum)
 Logan Vazquez has signs of a pneumonia on chest x-ray, discharged with steroids and antibiotics to treat this.  No signs of significant injury from his fall, reassuring imaging otherwise.  Continue as needed pain medicine at the facility.  Return to the ED with any worsening symptoms

## 2024-05-15 NOTE — ED Notes (Signed)
 When pain reassessed, pt initially said the pain medication intervention helped with his pain, however when asked to rate his current pain level, pt rated an 8/10.

## 2024-05-15 NOTE — ED Notes (Addendum)
 Pt is CAOx4, breathing normally, and normal in color. Pt is complaining of neck, head, and leg pain following a fall her had earlier this morning. Pt has no obvious deformities or injuries noted. Pt does grimace when palpating legs.   Fall bundle implemented with socks, bed alarm, and bracelet

## 2024-06-03 ENCOUNTER — Emergency Department

## 2024-06-03 ENCOUNTER — Emergency Department
Admission: EM | Admit: 2024-06-03 | Discharge: 2024-06-03 | Disposition: A | Discharge status: Home / Self Care | Attending: Emergency Medicine | Admitting: Emergency Medicine

## 2024-06-03 ENCOUNTER — Other Ambulatory Visit: Payer: Self-pay

## 2024-06-03 DIAGNOSIS — Z7902 Long term (current) use of antithrombotics/antiplatelets: Secondary | ICD-10-CM | POA: Insufficient documentation

## 2024-06-03 DIAGNOSIS — M25551 Pain in right hip: Secondary | ICD-10-CM

## 2024-06-03 DIAGNOSIS — W19XXXA Unspecified fall, initial encounter: Secondary | ICD-10-CM

## 2024-06-03 DIAGNOSIS — R748 Abnormal levels of other serum enzymes: Secondary | ICD-10-CM | POA: Insufficient documentation

## 2024-06-03 DIAGNOSIS — R944 Abnormal results of kidney function studies: Secondary | ICD-10-CM | POA: Diagnosis not present

## 2024-06-03 DIAGNOSIS — Z9181 History of falling: Secondary | ICD-10-CM | POA: Insufficient documentation

## 2024-06-03 DIAGNOSIS — R519 Headache, unspecified: Secondary | ICD-10-CM | POA: Insufficient documentation

## 2024-06-03 DIAGNOSIS — W1811XA Fall from or off toilet without subsequent striking against object, initial encounter: Secondary | ICD-10-CM | POA: Diagnosis not present

## 2024-06-03 DIAGNOSIS — T796XXA Traumatic ischemia of muscle, initial encounter: Secondary | ICD-10-CM

## 2024-06-03 DIAGNOSIS — T148XXA Other injury of unspecified body region, initial encounter: Secondary | ICD-10-CM

## 2024-06-03 DIAGNOSIS — M160 Bilateral primary osteoarthritis of hip: Secondary | ICD-10-CM | POA: Diagnosis not present

## 2024-06-03 LAB — CBC
HCT: 33 % — ABNORMAL LOW (ref 39.0–52.0)
Hemoglobin: 10.3 g/dL — ABNORMAL LOW (ref 13.0–17.0)
MCH: 29.5 pg (ref 26.0–34.0)
MCHC: 31.2 g/dL (ref 30.0–36.0)
MCV: 94.6 fL (ref 80.0–100.0)
Platelets: 246 10*3/uL (ref 150–400)
RBC: 3.49 MIL/uL — ABNORMAL LOW (ref 4.22–5.81)
RDW: 15.8 % — ABNORMAL HIGH (ref 11.5–15.5)
WBC: 8.3 10*3/uL (ref 4.0–10.5)
nRBC: 0 % (ref 0.0–0.2)

## 2024-06-03 LAB — BASIC METABOLIC PANEL WITH GFR
Anion gap: 10 (ref 5–15)
BUN: 69 mg/dL — ABNORMAL HIGH (ref 8–23)
CO2: 21 mmol/L — ABNORMAL LOW (ref 22–32)
Calcium: 8.5 mg/dL — ABNORMAL LOW (ref 8.9–10.3)
Chloride: 112 mmol/L — ABNORMAL HIGH (ref 98–111)
Creatinine, Ser: 2.11 mg/dL — ABNORMAL HIGH (ref 0.61–1.24)
GFR, Estimated: 31 mL/min — ABNORMAL LOW (ref >60)
Glucose, Bld: 141 mg/dL — ABNORMAL HIGH (ref 70–99)
Potassium: 4.9 mmol/L (ref 3.5–5.1)
Sodium: 143 mmol/L (ref 135–145)

## 2024-06-03 LAB — CK: Total CK: 2518 U/L — ABNORMAL HIGH (ref 49–397)

## 2024-06-03 MED ORDER — OXYCODONE HCL 5 MG PO TABS
5.0000 mg | ORAL_TABLET | Freq: Once | ORAL | Status: AC
  Administered 2024-06-03: 5 mg via ORAL
  Filled 2024-06-03: qty 1

## 2024-06-03 MED ORDER — SODIUM CHLORIDE 0.9 % IV BOLUS: 1000.0000 mL | Freq: Once | INTRAVENOUS | Status: DC

## 2024-06-03 MED ORDER — SODIUM CHLORIDE 0.9 % IV BOLUS
1000.0000 mL | Freq: Once | INTRAVENOUS | Status: AC
  Administered 2024-06-03: 1000 mL via INTRAVENOUS

## 2024-06-03 MED ORDER — HYDROMORPHONE HCL 1 MG/ML IJ SOLN
0.5000 mg | Freq: Once | INTRAMUSCULAR | Status: AC
  Administered 2024-06-03: 0.5 mg via INTRAVENOUS
  Filled 2024-06-03: qty 0.5

## 2024-06-03 MED ORDER — ONDANSETRON HCL 4 MG/2ML IJ SOLN
4.0000 mg | Freq: Once | INTRAMUSCULAR | Status: AC
  Administered 2024-06-03: 4 mg via INTRAVENOUS
  Filled 2024-06-03: qty 2

## 2024-06-03 NOTE — ED Notes (Signed)
 Spoke with Rep: Bridgette Campus from [place]; transport should arrive in 30 minutes

## 2024-06-03 NOTE — ED Notes (Signed)
 Pt back to room from MRI. NADN

## 2024-06-03 NOTE — ED Notes (Signed)
 Attempted to ambulate Pt with walker, Pt was able to walk a couple steps when the pain became too much. Pt was assisted back into bed. Pt was bent over when walking and unsteady. MD notified

## 2024-06-03 NOTE — ED Notes (Signed)
 Report given to [facility], prior to Discharge

## 2024-06-03 NOTE — ED Notes (Signed)
 Attempted to call [facility] with no answer.

## 2024-06-03 NOTE — ED Notes (Signed)
Pt to MRI, NADN

## 2024-06-03 NOTE — ED Notes (Signed)
 After chart review, Able to call facility contact, Argyle Began at [phone]. Report given, questions answered.

## 2024-06-03 NOTE — ED Provider Notes (Signed)
 [facility] Provider Note    Event Date/Time   First MD Initiated Contact with Patient 06/03/24 1306     (approximate)   History   Fall (/)   HPI  Logan Vazquez is a 78 y.o. male who comes in from [facility] with unwitnessed fall this morning.  Patient's had 3 falls on the toilet.  States he is not sure how long he was on the ground for he denies any head trauma.  He is on Plavix .  He does report right hip pain.  Patient given oxycodone  and morphine  at 7 AM.  He is on 2 L at baseline of oxygen   Physical Exam   Triage Vital Signs: ED Triage Vitals  Encounter Vitals Group     BP 06/03/24 1149 (!) 115/57     Girls Systolic BP Percentile --      Girls Diastolic BP Percentile --      Boys Systolic BP Percentile --      Boys Diastolic BP Percentile --      Pulse Rate 06/03/24 1149 63     Resp 06/03/24 1149 18     Temp 06/03/24 1149 97.9 F (36.6 C)     Temp Source 06/03/24 1149 Oral     SpO2 06/03/24 1149 97 %     Weight --      Height --      Head Circumference --      Peak Flow --      Pain Score 06/03/24 1150 5     Pain Loc --      Pain Education --      Exclude from Growth Chart --     Most recent vital signs: Vitals:   06/03/24 1149  BP: (!) 115/57  Pulse: 63  Resp: 18  Temp: 97.9 F (36.6 C)  SpO2: 97%     General: Awake, no distress.  CV:  Good peripheral perfusion.  Resp:  Normal effort.  Abd:  No distention.  Other:  Right hip pain.  Good distal pulse.  No chest wall tenderness no abdominal tenderness able to lift the leg slightly up off the bed but reports significant pain in the right hip   ED Results / Procedures / Treatments   Labs (all labs ordered are listed, but only abnormal results are displayed) Labs Reviewed  BASIC METABOLIC PANEL WITH GFR - Abnormal; Notable for the following components:      Result Value   Chloride 112 (*)    CO2 21 (*)    Glucose, Bld 141 (*)    BUN 69 (*)    Creatinine, Ser 2.11  (*)    Calcium  8.5 (*)    GFR, Estimated 31 (*)    All other components within normal limits  CBC - Abnormal; Notable for the following components:   RBC 3.49 (*)    Hemoglobin 10.3 (*)    HCT 33.0 (*)    RDW 15.8 (*)    All other components within normal limits  CK - Abnormal; Notable for the following components:   Total CK 2,518 (*)    All other components within normal limits  CK     EKG  My interpretation of EKG:  Normal sinus rhythm 64 without any ST elevation or T wave inversions, QTc 422  RADIOLOGY I have reviewed the xray personally and interpreted no obvious hip fracture   PROCEDURES:  Critical Care performed: No  Procedures   MEDICATIONS ORDERED IN  ED: Medications  sodium chloride  0.9 % bolus 1,000 mL (has no administration in time range)  oxyCODONE  (Oxy IR/ROXICODONE ) immediate release tablet 5 mg (has no administration in time range)  HYDROmorphone  (DILAUDID ) injection 0.5 mg (0.5 mg Intravenous Given 06/03/24 1438)  ondansetron  (ZOFRAN ) injection 4 mg (4 mg Intravenous Given 06/03/24 1437)  sodium chloride  0.9 % bolus 1,000 mL (1,000 mLs Intravenous New Bag/Given 06/03/24 1438)     IMPRESSION / MDM / ASSESSMENT AND PLAN / ED COURSE  I reviewed the triage vital signs and the nursing notes.   Patient's presentation is most consistent with acute presentation with potential threat to life or bodily function.   Patient came in with a fall.  X-ray was difficult to tell if there was any fracture so I discussed with Dr. Manford See and proceed with CT imaging of the pelvis this was negative for hip fracture.  I did order a CT head CT cervical to ensure no evidence of intracranial hemorrhage, cervical fracture.  His BMP does show a elevated creatinine of 2.11 and he has slight elevation of CK level.  This concerning for rhabdo.  Offered patient admission to the hospital for IV fluids and to repeat this but patient declined.  Patient is on hospice care.  I attempted  to call the hospice number and they did not pick up.  Will try to stand patient up and if able to stand and pivot patient can be discharged back to facility.    The patient is on the cardiac monitor to evaluate for evidence of arrhythmia and/or significant heart rate changes.      FINAL CLINICAL IMPRESSION(S) / ED DIAGNOSES   Final diagnoses:  Fall, initial encounter  Pain of right hip     Rx / DC Orders   ED Discharge Orders     None        Note:  This document was prepared using Dragon voice recognition software and may include unintentional dictation errors.   Lubertha Rush, MD 06/03/24 [phone]

## 2024-06-03 NOTE — ED Provider Notes (Signed)
 Further workup of the right hip does not show any acute fracture.  Does however show concerns for muscle tears.  I discussed this with the patient.  I did offer to have patient be seen for possible placement in rehab facility given concerns for ambulation.  However patient assures me that they would be able to take care of him at his facility.  Did offer to have patient be evaluated for possible rehab placement but he declined.   Marylynn Soho, MD 06/04/24 0000

## 2024-06-03 NOTE — Progress Notes (Signed)
 [facility] Liaison Note  This patient is currently followed by [facility].  [facility] will follow through discharge disposition.  Please call with any Hospice related questions or concerns  [facility] - Inpatient Liaison [phone]

## 2024-06-03 NOTE — ED Notes (Signed)
 Attempted to call Anise Kerns (sister) a call and update on pt status per pt request with no answer.

## 2024-06-03 NOTE — ED Notes (Signed)
 Attempting to call facility with no answer.

## 2024-06-03 NOTE — Discharge Instructions (Addendum)
 Talk to your hospice team about your pain control.  Stay well-hydrated.    Return to the ER for worsening symptoms or any other concerns

## 2024-06-03 NOTE — ED Notes (Addendum)
 Attempted to call [facility] with no answer.

## 2024-06-03 NOTE — ED Triage Notes (Signed)
 Pt to ED via [facility] from [facility]. Pt had unwitnessed fall this am. Pt reports had 3 falls on toilet. Pt denies LOC or head trauma. Pt is on Plavix . Pt reports pain to right hip. Pt was given oxycodone  and morphine  at 7am. Pt wears 2L [place] chronic.

## 2024-08-16 DEATH — deceased
